# Patient Record
Sex: Female | Born: 1945
Health system: Southern US, Community
[De-identification: ages and names within clinical notes are randomized; demographics above are authoritative.]

## PROBLEM LIST (undated history)

## (undated) DIAGNOSIS — I701 Atherosclerosis of renal artery: Secondary | ICD-10-CM

## (undated) DIAGNOSIS — J189 Pneumonia, unspecified organism: Secondary | ICD-10-CM

## (undated) DIAGNOSIS — I240 Acute coronary thrombosis not resulting in myocardial infarction: Secondary | ICD-10-CM

## (undated) DIAGNOSIS — J449 Chronic obstructive pulmonary disease, unspecified: Secondary | ICD-10-CM

## (undated) DIAGNOSIS — I10 Essential (primary) hypertension: Secondary | ICD-10-CM

## (undated) DIAGNOSIS — I2510 Atherosclerotic heart disease of native coronary artery without angina pectoris: Secondary | ICD-10-CM

## (undated) DIAGNOSIS — I2102 ST elevation (STEMI) myocardial infarction involving left anterior descending coronary artery: Secondary | ICD-10-CM

## (undated) DIAGNOSIS — I509 Heart failure, unspecified: Secondary | ICD-10-CM

## (undated) DIAGNOSIS — I82409 Acute embolism and thrombosis of unspecified deep veins of unspecified lower extremity: Secondary | ICD-10-CM

## (undated) DIAGNOSIS — I255 Ischemic cardiomyopathy: Secondary | ICD-10-CM

## (undated) DIAGNOSIS — Z95810 Presence of automatic (implantable) cardiac defibrillator: Secondary | ICD-10-CM

## (undated) DIAGNOSIS — E785 Hyperlipidemia, unspecified: Secondary | ICD-10-CM

## (undated) DIAGNOSIS — E119 Type 2 diabetes mellitus without complications: Secondary | ICD-10-CM

## (undated) DIAGNOSIS — I219 Acute myocardial infarction, unspecified: Secondary | ICD-10-CM

## (undated) DIAGNOSIS — B159 Hepatitis A without hepatic coma: Secondary | ICD-10-CM

## (undated) DIAGNOSIS — Z951 Presence of aortocoronary bypass graft: Secondary | ICD-10-CM

## (undated) DIAGNOSIS — D689 Coagulation defect, unspecified: Secondary | ICD-10-CM

## (undated) HISTORY — DX: Hyperlipidemia, unspecified: E78.5

## (undated) HISTORY — PX: TONSILLECTOMY AND ADENOIDECTOMY: SUR1326

## (undated) HISTORY — DX: Acute myocardial infarction, unspecified: I21.9

## (undated) HISTORY — DX: Essential (primary) hypertension: I10

## (undated) HISTORY — DX: Atherosclerosis of renal artery: I70.1

## (undated) HISTORY — DX: Atherosclerotic heart disease of native coronary artery without angina pectoris: I25.10

## (undated) HISTORY — DX: Chronic obstructive pulmonary disease, unspecified: J44.9

## (undated) HISTORY — DX: Coagulation defect, unspecified: D68.9

## (undated) HISTORY — DX: Ischemic cardiomyopathy: I25.5

---
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
## 1985-11-21 DIAGNOSIS — B159 Hepatitis A without hepatic coma: Secondary | ICD-10-CM

## 1985-11-21 HISTORY — DX: Hepatitis a without hepatic coma: B15.9

## 1989-11-21 DIAGNOSIS — I219 Acute myocardial infarction, unspecified: Secondary | ICD-10-CM

## 1989-11-21 HISTORY — DX: Acute myocardial infarction, unspecified: I21.9

## 1989-11-21 HISTORY — PX: CORONARY ANGIOPLASTY: SHX604

## 2003-11-22 HISTORY — PX: RENAL ARTERY STENT: SHX2321

## 2013-03-12 DIAGNOSIS — J438 Other emphysema: Secondary | ICD-10-CM | POA: Diagnosis not present

## 2013-03-12 DIAGNOSIS — R062 Wheezing: Secondary | ICD-10-CM | POA: Diagnosis not present

## 2013-03-12 DIAGNOSIS — J209 Acute bronchitis, unspecified: Secondary | ICD-10-CM | POA: Diagnosis not present

## 2013-03-12 DIAGNOSIS — I10 Essential (primary) hypertension: Secondary | ICD-10-CM | POA: Diagnosis not present

## 2013-03-12 DIAGNOSIS — R059 Cough, unspecified: Secondary | ICD-10-CM | POA: Diagnosis not present

## 2013-03-12 DIAGNOSIS — R05 Cough: Secondary | ICD-10-CM | POA: Diagnosis not present

## 2013-04-11 ENCOUNTER — Ambulatory Visit: Payer: Self-pay | Admitting: Cardiology

## 2013-05-07 ENCOUNTER — Telehealth: Payer: Self-pay | Admitting: Cardiology

## 2013-05-07 DIAGNOSIS — Z1231 Encounter for screening mammogram for malignant neoplasm of breast: Secondary | ICD-10-CM | POA: Diagnosis not present

## 2013-05-07 NOTE — Telephone Encounter (Signed)
Pt Dropped Off LOTS of Medical Records For Her NP appt W/ Dr.Hochrein, gave to  [facility] F. He Wants originals back, Copy what Needs to Go in Chart  05/07/13/KM

## 2013-06-03 ENCOUNTER — Telehealth: Payer: Self-pay | Admitting: Cardiology

## 2013-06-03 NOTE — Telephone Encounter (Signed)
ROI faxed to [facility] [phone]  06/03/13/KM

## 2013-06-04 ENCOUNTER — Encounter: Payer: Self-pay | Admitting: Cardiology

## 2013-06-04 ENCOUNTER — Telehealth: Payer: Self-pay | Admitting: Cardiology

## 2013-06-04 ENCOUNTER — Ambulatory Visit (INDEPENDENT_AMBULATORY_CARE_PROVIDER_SITE_OTHER): Payer: Medicare Other | Admitting: Cardiology

## 2013-06-04 VITALS — BP 100/60 | HR 60 | Ht 66.0 in | Wt 179.4 lb

## 2013-06-04 DIAGNOSIS — I2510 Atherosclerotic heart disease of native coronary artery without angina pectoris: Secondary | ICD-10-CM

## 2013-06-04 DIAGNOSIS — I25810 Atherosclerosis of coronary artery bypass graft(s) without angina pectoris: Secondary | ICD-10-CM

## 2013-06-04 HISTORY — DX: Atherosclerotic heart disease of native coronary artery without angina pectoris: I25.10

## 2013-06-04 NOTE — Patient Instructions (Addendum)
The current medical regimen is effective;  continue present plan and medications.  Follow up in 1 year with Dr Hochrein.  You will receive a letter in the mail 2 months before you are due.  Please call us when you receive this letter to schedule your follow up appointment.  

## 2013-06-04 NOTE — Progress Notes (Signed)
HPI The patient presents as a new patient for evaluation of coronary disease and a history of a V. Fib arrest. She had a myocardial infarction in 1991. This was treated with TPA. She was evaluated in 1996 for angina. Cardiac catheterization at that time demonstrated mild narrowing in the mid LAD, circumflex had 50-60% distal circumflex stenosis before the origin of the posterior lateral. The right coronary artery was dominant but narrow caliber with long diffuse 50% stenosis and mid 90% stenosis.  She has had a normal left ventricular function.  She was most recently evaluated with a stress echocardiogram in 2013. EF was 70%. There was some resting basilar inferior dyskinesia without exercise-induced ischemic wall motion abnormalities.    The patient denies any new symptoms such as chest discomfort, neck or arm discomfort. There has been no new shortness of breath, PND or orthopnea. There have been no reported palpitations, presyncope or syncope.  She gets rare twinges of sharp chest pain but these are fleeting and a stable pattern. She never has to take a nitroglycerin.  She has had no new symptoms since her last stress test.  She exercises 3 x per week.    No Known Allergies  Current Outpatient Prescriptions  Medication Sig Dispense Refill  . aspirin 81 MG tablet Take 81 mg by mouth daily.      Marland Kitchen atenolol (TENORMIN) 50 MG tablet Take 25 mg by mouth daily. 1/2 tab daily      . atorvastatin (LIPITOR) 80 MG tablet Take 80 mg by mouth daily.      . calcium-vitamin D (OSCAL WITH D) 500-200 MG-UNIT per tablet Take 1 tablet by mouth 2 (two) times daily.      Marland Kitchen co-enzyme Q-10 30 MG capsule Take 100 mg by mouth 2 (two) times daily.      . Magnesium 500 MG TABS Take by mouth.      . Multiple Vitamin (MULTIVITAMIN) tablet Take 1 tablet by mouth daily.      . nitroGLYCERIN (NITROSTAT) 0.4 MG SL tablet Place 0.4 mg under the tongue every 5 (five) minutes as needed for chest pain.      . Red Yeast Rice  Extract (RED YEAST RICE PO) Take by mouth.       No current facility-administered medications for this visit.    Past Medical History  Diagnosis Date  . Coronary artery disease   . Hyperlipidemia   . MI (myocardial infarction)   . Unstable angina   . Sinus bradycardia     Past Surgical History  Procedure Laterality Date  . Cardiac catheterization      Family History  Problem Relation Age of Onset  . Heart attack Father     History   Social History  . Marital Status: Married    Spouse Name: N/A    Number of Children: N/A  . Years of Education: N/A   Occupational History  . Not on file.   Social History Main Topics  . Smoking status: Former Smoker    Quit date: 06/05/2011  . Smokeless tobacco: Former Neurosurgeon  . Alcohol Use: Not on file  . Drug Use: Not on file  . Sexually Active: Not on file   Other Topics Concern  . Not on file   Social History Narrative  . No narrative on file    ROS:  Positive for shoulder pain, varicose veins. Otherwise as stated in the HPI and negative for all other systems.  PHYSICAL EXAM BP 100/60  Pulse 60  Ht 5\' 6"  (1.676 m)  Wt 179 lb 6.4 oz (81.375 kg)  BMI 28.97 kg/m2 GENERAL:  Well appearing HEENT:  Pupils equal round and reactive, fundi not visualized, oral mucosa unremarkable NECK:  No jugular venous distention, waveform within normal limits, carotid upstroke brisk and symmetric, no bruits, no thyromegaly LYMPHATICS:  No cervical, inguinal adenopathy LUNGS:  Clear to auscultation bilaterally BACK:  No CVA tenderness CHEST:  Unremarkable HEART:  PMI not displaced or sustained,S1 and S2 within normal limits, no S3, no S4, no clicks, no rubs, no murmurs ABD:  Flat, positive bowel sounds normal in frequency in pitch, no bruits, no rebound, no guarding, no midline pulsatile mass, no hepatomegaly, no splenomegaly EXT:  2 plus pulses throughout, no edema, no cyanosis no clubbing SKIN:  No rashes no nodules NEURO:  Cranial nerves  II through XII grossly intact, motor grossly intact throughout PSYCH:  Cognitively intact, oriented to person place and time   EKG:  Sinus rhythm, rate 60, axis within normal limits, intervals within normal limits, PVCs in a trigeminal pattern, no acute ST-T wave changes. 06/04/2013  ASSESSMENT AND PLAN  CAD:  The patient has no ongoing symptoms and is practicing risk reduction discussed this at length. Given the fact that she had a stress test last year no further testing is indicated at this time.  HYPERLIPIDEMIA:  We had a long discussion about this. I would not suggest red yeast rice with statins.    (All available outside records reviewed.)

## 2013-06-04 NOTE — Telephone Encounter (Signed)
Records rec From [facility] gave to [facility] 06/04/13/KM

## 2013-07-26 DIAGNOSIS — I10 Essential (primary) hypertension: Secondary | ICD-10-CM | POA: Diagnosis not present

## 2013-07-26 DIAGNOSIS — E785 Hyperlipidemia, unspecified: Secondary | ICD-10-CM | POA: Diagnosis not present

## 2013-08-02 DIAGNOSIS — Z0000 Encounter for general adult medical examination without abnormal findings: Secondary | ICD-10-CM | POA: Diagnosis not present

## 2013-08-02 DIAGNOSIS — Z1331 Encounter for screening for depression: Secondary | ICD-10-CM | POA: Diagnosis not present

## 2013-08-02 DIAGNOSIS — J438 Other emphysema: Secondary | ICD-10-CM | POA: Diagnosis not present

## 2013-08-02 DIAGNOSIS — E785 Hyperlipidemia, unspecified: Secondary | ICD-10-CM | POA: Diagnosis not present

## 2013-08-02 DIAGNOSIS — Z23 Encounter for immunization: Secondary | ICD-10-CM | POA: Diagnosis not present

## 2013-08-02 DIAGNOSIS — I10 Essential (primary) hypertension: Secondary | ICD-10-CM | POA: Diagnosis not present

## 2013-08-02 DIAGNOSIS — R7309 Other abnormal glucose: Secondary | ICD-10-CM | POA: Diagnosis not present

## 2013-08-02 DIAGNOSIS — M25519 Pain in unspecified shoulder: Secondary | ICD-10-CM | POA: Diagnosis not present

## 2013-08-02 DIAGNOSIS — I259 Chronic ischemic heart disease, unspecified: Secondary | ICD-10-CM | POA: Diagnosis not present

## 2013-08-06 DIAGNOSIS — Z780 Asymptomatic menopausal state: Secondary | ICD-10-CM | POA: Diagnosis not present

## 2013-08-07 DIAGNOSIS — Z1212 Encounter for screening for malignant neoplasm of rectum: Secondary | ICD-10-CM | POA: Diagnosis not present

## 2013-09-02 DIAGNOSIS — M25519 Pain in unspecified shoulder: Secondary | ICD-10-CM | POA: Diagnosis not present

## 2013-09-02 DIAGNOSIS — F329 Major depressive disorder, single episode, unspecified: Secondary | ICD-10-CM | POA: Diagnosis not present

## 2013-09-02 DIAGNOSIS — E785 Hyperlipidemia, unspecified: Secondary | ICD-10-CM | POA: Diagnosis not present

## 2013-09-02 DIAGNOSIS — Z6828 Body mass index (BMI) 28.0-28.9, adult: Secondary | ICD-10-CM | POA: Diagnosis not present

## 2013-09-23 DIAGNOSIS — M7500 Adhesive capsulitis of unspecified shoulder: Secondary | ICD-10-CM | POA: Diagnosis not present

## 2013-09-23 DIAGNOSIS — M25819 Other specified joint disorders, unspecified shoulder: Secondary | ICD-10-CM | POA: Diagnosis not present

## 2013-09-30 DIAGNOSIS — M25819 Other specified joint disorders, unspecified shoulder: Secondary | ICD-10-CM | POA: Diagnosis not present

## 2013-10-04 DIAGNOSIS — M25819 Other specified joint disorders, unspecified shoulder: Secondary | ICD-10-CM | POA: Diagnosis not present

## 2013-10-08 DIAGNOSIS — M25819 Other specified joint disorders, unspecified shoulder: Secondary | ICD-10-CM | POA: Diagnosis not present

## 2013-10-10 DIAGNOSIS — M25819 Other specified joint disorders, unspecified shoulder: Secondary | ICD-10-CM | POA: Diagnosis not present

## 2013-10-14 DIAGNOSIS — M25819 Other specified joint disorders, unspecified shoulder: Secondary | ICD-10-CM | POA: Diagnosis not present

## 2013-10-16 DIAGNOSIS — M25819 Other specified joint disorders, unspecified shoulder: Secondary | ICD-10-CM | POA: Diagnosis not present

## 2013-10-22 DIAGNOSIS — M25819 Other specified joint disorders, unspecified shoulder: Secondary | ICD-10-CM | POA: Diagnosis not present

## 2013-10-24 DIAGNOSIS — M25819 Other specified joint disorders, unspecified shoulder: Secondary | ICD-10-CM | POA: Diagnosis not present

## 2013-10-29 DIAGNOSIS — M25819 Other specified joint disorders, unspecified shoulder: Secondary | ICD-10-CM | POA: Diagnosis not present

## 2013-11-04 DIAGNOSIS — M25819 Other specified joint disorders, unspecified shoulder: Secondary | ICD-10-CM | POA: Diagnosis not present

## 2014-08-08 DIAGNOSIS — I10 Essential (primary) hypertension: Secondary | ICD-10-CM | POA: Diagnosis not present

## 2014-08-08 DIAGNOSIS — E785 Hyperlipidemia, unspecified: Secondary | ICD-10-CM | POA: Diagnosis not present

## 2014-08-08 DIAGNOSIS — R7309 Other abnormal glucose: Secondary | ICD-10-CM | POA: Diagnosis not present

## 2014-08-15 DIAGNOSIS — J438 Other emphysema: Secondary | ICD-10-CM | POA: Diagnosis not present

## 2014-08-15 DIAGNOSIS — I10 Essential (primary) hypertension: Secondary | ICD-10-CM | POA: Diagnosis not present

## 2014-08-15 DIAGNOSIS — I259 Chronic ischemic heart disease, unspecified: Secondary | ICD-10-CM | POA: Diagnosis not present

## 2014-08-15 DIAGNOSIS — Z23 Encounter for immunization: Secondary | ICD-10-CM | POA: Diagnosis not present

## 2014-08-15 DIAGNOSIS — E785 Hyperlipidemia, unspecified: Secondary | ICD-10-CM | POA: Diagnosis not present

## 2014-08-15 DIAGNOSIS — R7309 Other abnormal glucose: Secondary | ICD-10-CM | POA: Diagnosis not present

## 2014-08-15 DIAGNOSIS — M79609 Pain in unspecified limb: Secondary | ICD-10-CM | POA: Diagnosis not present

## 2014-08-15 DIAGNOSIS — Z0000 Encounter for general adult medical examination without abnormal findings: Secondary | ICD-10-CM | POA: Diagnosis not present

## 2014-08-29 ENCOUNTER — Encounter: Payer: Self-pay | Admitting: Podiatry

## 2014-08-29 ENCOUNTER — Ambulatory Visit (INDEPENDENT_AMBULATORY_CARE_PROVIDER_SITE_OTHER): Payer: Medicare Other

## 2014-08-29 ENCOUNTER — Ambulatory Visit (INDEPENDENT_AMBULATORY_CARE_PROVIDER_SITE_OTHER): Payer: Medicare Other | Admitting: Podiatry

## 2014-08-29 VITALS — BP 141/80 | HR 67 | Resp 13 | Ht 66.0 in | Wt 175.0 lb

## 2014-08-29 DIAGNOSIS — M7741 Metatarsalgia, right foot: Secondary | ICD-10-CM | POA: Diagnosis not present

## 2014-08-29 DIAGNOSIS — M2040 Other hammer toe(s) (acquired), unspecified foot: Secondary | ICD-10-CM

## 2014-08-29 DIAGNOSIS — M79673 Pain in unspecified foot: Secondary | ICD-10-CM | POA: Diagnosis not present

## 2014-08-29 DIAGNOSIS — M7742 Metatarsalgia, left foot: Secondary | ICD-10-CM

## 2014-08-29 DIAGNOSIS — M2010 Hallux valgus (acquired), unspecified foot: Secondary | ICD-10-CM | POA: Diagnosis not present

## 2014-08-29 NOTE — Progress Notes (Signed)
   Subjective:    Patient ID: Hailey Wright, female    DOB: Jul 29, 1946, 68 y.o.   MRN: [id number]  HPI Comments: Hailey Wright, 68 year old female, presents the office they with bilateral foot pain in the ball of her foot as well as curling of the left fourth digit. She states that she had hammertoe procedure on the left fourth digit in the 1970s for which she stated the toe was straightened however over the last several years she's noticed the toe curling and worsening over the last year. Also states that she has a numbness tingling sensation to the ball of her foot when walking and into the toes which has been present for approximately 2 years. Denies any recent injury or trauma to the area. No other complaints at this time.  Foot Pain      Review of Systems  All other systems reviewed and are negative.      Objective:   Physical Exam AAO x3, NAD DP/PT pulses palpable b/l. CRT < 3 sec Protective sensation intact with Simms Weinstein monofilament, vibratory sensation intact, Achilles tendon reflex intact. Diffuse tenderness over the metatarsal heads plantarly 1 through 5 bilaterally with atrophy of the plantar fat pad. Decreased range of motion of the first MTPJ.  Bilateral hammertoe deformities. Left fourth digit and adductovarus position with small pinch callus plantar aspect. Bunion deformity bilaterally. No pinpoint bony tenderness of the digits or metatarsals or pain with vibratory sensation. No pain with range of motion of MTPJ's on the lesser digits.  Decreased in medial arch height upon weightbearing bilaterally. MMT 5/5, ROM WNL No calf pain, swelling, warmth. No open lesions to      Assessment & Plan:  67 year old female with bilateral metatarsalgia as a result of flatfoot, hammertoe deformity, bunion/decreased range of motion of the first MTPJ with loss of fat pad plantarly -X-ray stretching and reviewed the patient. -Conservative versus surgical treatment were discussed  including alternatives, risks, complications. -Discussed supportive shoe gear and possible orthotics to help support her foot type. -Dispensed metatarsal pads and applied just proximal to the metatarsal heads. She states after application and standing she had some relief of symptoms at this time. -Offloading pads to the left fourth digit. -Followup in 4 weeks or sooner if any problems are to arise or any changes symptoms. Discussed with her that she purchase an orthotic to bring it with her at her next appointment. Call sooner with any questions, concerns, change in symptoms.

## 2014-09-02 ENCOUNTER — Encounter: Payer: Self-pay | Admitting: Podiatry

## 2014-09-16 DIAGNOSIS — L814 Other melanin hyperpigmentation: Secondary | ICD-10-CM | POA: Diagnosis not present

## 2014-09-16 DIAGNOSIS — L570 Actinic keratosis: Secondary | ICD-10-CM | POA: Diagnosis not present

## 2014-09-16 DIAGNOSIS — D225 Melanocytic nevi of trunk: Secondary | ICD-10-CM | POA: Diagnosis not present

## 2014-09-16 DIAGNOSIS — L821 Other seborrheic keratosis: Secondary | ICD-10-CM | POA: Diagnosis not present

## 2014-09-16 DIAGNOSIS — D485 Neoplasm of uncertain behavior of skin: Secondary | ICD-10-CM | POA: Diagnosis not present

## 2014-09-18 DIAGNOSIS — Z1231 Encounter for screening mammogram for malignant neoplasm of breast: Secondary | ICD-10-CM | POA: Diagnosis not present

## 2014-09-19 ENCOUNTER — Encounter: Payer: Self-pay | Admitting: *Deleted

## 2014-09-24 ENCOUNTER — Encounter: Payer: Self-pay | Admitting: Cardiology

## 2014-09-24 ENCOUNTER — Ambulatory Visit (INDEPENDENT_AMBULATORY_CARE_PROVIDER_SITE_OTHER): Payer: Medicare Other | Admitting: Cardiology

## 2014-09-24 VITALS — BP 120/66 | HR 60 | Ht 66.0 in | Wt 182.0 lb

## 2014-09-24 DIAGNOSIS — I208 Other forms of angina pectoris: Secondary | ICD-10-CM

## 2014-09-24 MED ORDER — ISOSORBIDE MONONITRATE ER 30 MG PO TB24: 30.0000 mg | ORAL_TABLET | Freq: Every day | ORAL | Status: DC

## 2014-09-24 NOTE — Progress Notes (Signed)
HPI The patient presents as a new patient for evaluation of coronary disease and a history of a V. Fib arrest. She had a myocardial infarction in 1991. This was treated with TPA. She was evaluated in 1996 for angina. Cardiac catheterization at that time demonstrated mild narrowing in the mid LAD, circumflex had 50-60% distal circumflex stenosis before the origin of the posterior lateral. The right coronary artery was dominant but narrow caliber with long diffuse 50% stenosis and mid 90% stenosis.  She has had a normal left ventricular function.  She was most recently evaluated with a stress echocardiogram in 2013. EF was 70%. There was some resting basilar inferior dyskinesia without exercise-induced ischemic wall motion abnormalities.    She returns today for followup of this.  She has been having some chest discomfort which seems to have been happening over the past 5-6 weeks. It happens with significant exertion such as when she was hiking at [employer].  She had some 5-6/10 discomfort that reminded her of her previous angina. There was no radiation. It was substernal. There were no associated symptoms. It stops a few minutes after she rests very and she can't get dizzy she does her usual walking on a treadmill. She does think however it might be progressing and how easily it comes on. She does not however have any resting symptoms. She's had no new shortness of breath, PND or orthopnea. She's had no new palpitations, presyncope or syncope.  No Known Allergies  Current Outpatient Prescriptions  Medication Sig Dispense Refill  . aspirin 81 MG tablet Take 81 mg by mouth daily.    Marland Kitchen atenolol (TENORMIN) 25 MG tablet Take 12.5 mg by mouth daily.    Marland Kitchen atorvastatin (LIPITOR) 80 MG tablet Take 80 mg by mouth daily.    . calcium-vitamin D (OSCAL WITH D) 500-200 MG-UNIT per tablet Take 1 tablet by mouth 2 (two) times daily.    Marland Kitchen Co-Enzyme Q10 200 MG CAPS Take 220 mg by mouth daily.    . Magnesium 500 MG  TABS Take by mouth.    . Multiple Vitamin (MULTIVITAMIN) tablet Take 1 tablet by mouth daily.    . nitroGLYCERIN (NITROSTAT) 0.4 MG SL tablet Place 0.4 mg under the tongue every 5 (five) minutes as needed for chest pain.     No current facility-administered medications for this visit.    Past Medical History  Diagnosis Date  . Hyperlipidemia   . MI (myocardial infarction)     Inferior treated with TPA 1991, POBA RCA 1996  . RAS (renal artery stenosis)     Right renal stent 2005    Past Surgical History  Procedure Laterality Date  . Rhinoplasty    . Tonsillectomy and adenoidectomy      ROS:  Positive for shoulder pain, varicose veins. Otherwise as stated in the HPI and negative for all other systems.  PHYSICAL EXAM BP 120/66 mmHg  Pulse 60  Ht 5\' 6"  (1.676 m)  Wt 182 lb (82.555 kg)  BMI 29.39 kg/m2 GENERAL:  Well appearing HEENT:  Pupils equal round and reactive, fundi not visualized, oral mucosa unremarkable NECK:  No jugular venous distention, waveform within normal limits, carotid upstroke brisk and symmetric, no bruits, no thyromegaly LUNGS:  Clear to auscultation bilaterally BACK:  No CVA tenderness CHEST:  Unremarkable HEART:  PMI not displaced or sustained,S1 and S2 within normal limits, no S3, no S4, no clicks, no rubs, no murmurs ABD:  Flat, positive bowel sounds normal in frequency  in pitch, no bruits, no rebound, no guarding, no midline pulsatile mass, no hepatomegaly, no splenomegaly EXT:  2 plus pulses throughout, no edema, no cyanosis no clubbing   EKG:  Sinus rhythm, rate 60, axis within normal limits, intervals within normal limits, PVCs, no acute ST-T wave changes. 09/24/2014  ASSESSMENT AND PLAN  CAD:  The patient is having exertional symptoms. I might consider this class II angina. I'm going to risk stratify with a dobutamine echo. If this is low risk we will continue to manage this medically. I will also start Imdur 30 once a day.  Of note we did discuss  that it she is low risk study but it's worsening symptoms she would still need to come to see me as I would have a low threshold for future cardiac catheterizations.    HYPERLIPIDEMIA:  She is now being followed in one of the PCSK9 studies apparently. I will defer to Donnajean Lopes, MD

## 2014-09-24 NOTE — Addendum Note (Signed)
Addended by: Golden Hurter D on: 09/24/2014 11:39 AM   Modules accepted: Orders

## 2014-09-24 NOTE — Patient Instructions (Signed)
Start Imdur 30 mg daily   Your physician recommends that you schedule a follow-up appointment in: 2 months.

## 2014-09-26 NOTE — Addendum Note (Signed)
Addended by: Vear Clock on: 09/26/2014 03:24 PM   Modules accepted: Orders

## 2014-11-24 ENCOUNTER — Ambulatory Visit (INDEPENDENT_AMBULATORY_CARE_PROVIDER_SITE_OTHER): Payer: Medicare Other | Admitting: Cardiology

## 2014-11-24 ENCOUNTER — Encounter: Payer: Self-pay | Admitting: Cardiology

## 2014-11-24 VITALS — BP 109/64 | HR 55 | Ht 66.0 in | Wt 169.9 lb

## 2014-11-24 DIAGNOSIS — I2510 Atherosclerotic heart disease of native coronary artery without angina pectoris: Secondary | ICD-10-CM | POA: Diagnosis not present

## 2014-11-24 NOTE — Progress Notes (Signed)
HPI The patient presents for follow up of coronary disease and a history of a V. Fib arrest. She had a myocardial infarction in 1991. This was treated with TPA. She was evaluated in 1996 for angina. Cardiac catheterization at that time demonstrated mild narrowing in the mid LAD, circumflex had 50-60% distal circumflex stenosis before the origin of the posterior lateral. The right coronary artery was dominant but narrow caliber with long diffuse 50% stenosis and mid 90% stenosis.  She has had a normal left ventricular function.  She was most recently evaluated with a stress echocardiogram in 2013. EF was 70%. There was some resting basilar inferior dyskinesia without exercise-induced ischemic wall motion abnormalities.    At the last visit the patient was having some chest discomfort . I added Imdur. This completely took care of her symptoms. She says she's exercising routinely on an elliptical and has no issues with this. She cannot develop any chest discomfort with this. She denies any shortness of breath, PND or orthopnea. She feels well.  No Known Allergies  Current Outpatient Prescriptions  Medication Sig Dispense Refill  . aspirin 81 MG tablet Take 81 mg by mouth daily.    Marland Kitchen atenolol (TENORMIN) 25 MG tablet Take 12.5 mg by mouth daily.    Marland Kitchen atorvastatin (LIPITOR) 80 MG tablet Take 80 mg by mouth daily.    . calcium-vitamin D (OSCAL WITH D) 500-200 MG-UNIT per tablet Take 1 tablet by mouth 2 (two) times daily.    Marland Kitchen Co-Enzyme Q10 200 MG CAPS Take 220 mg by mouth daily.    . isosorbide mononitrate (IMDUR) 30 MG 24 hr tablet Take 1 tablet (30 mg total) by mouth daily. 90 tablet 3  . Magnesium 500 MG TABS Take by mouth.    . Multiple Vitamin (MULTIVITAMIN) tablet Take 1 tablet by mouth daily.    . nitroGLYCERIN (NITROSTAT) 0.4 MG SL tablet Place 0.4 mg under the tongue every 5 (five) minutes as needed for chest pain.     No current facility-administered medications for this visit.    Past  Medical History  Diagnosis Date  . Hyperlipidemia   . MI (myocardial infarction)     Inferior treated with TPA 1991, POBA RCA 1996  . RAS (renal artery stenosis)     Right renal stent 2005    Past Surgical History  Procedure Laterality Date  . Rhinoplasty    . Tonsillectomy and adenoidectomy      ROS:  As stated in the HPI and negative for all other systems.  PHYSICAL EXAM BP 109/64 mmHg  Pulse 55  Ht 5\' 6"  (1.676 m)  Wt 169 lb 14.4 oz (77.066 kg)  BMI 27.44 kg/m2 GENERAL:  Well appearing HEENT:  Pupils equal round and reactive, fundi not visualized, oral mucosa unremarkable NECK:  No jugular venous distention, waveform within normal limits, carotid upstroke brisk and symmetric, no bruits, no thyromegaly LUNGS:  Clear to auscultation bilaterally BACK:  No CVA tenderness CHEST:  Unremarkable HEART:  PMI not displaced or sustained,S1 and S2 within normal limits, no S3, no S4, no clicks, no rubs, no murmurs ABD:  Flat, positive bowel sounds normal in frequency in pitch, no bruits, no rebound, no guarding, no midline pulsatile mass, no hepatomegaly, no splenomegaly EXT:  2 plus pulses throughout, no edema, no cyanosis no clubbing   ASSESSMENT AND PLAN  CAD:  The patient is having no further symptoms.  We did not do further stress testing.  However, at this point I  don't think this is indicated. She will continue the medications as listed.  HYPERLIPIDEMIA:  She completed a PCKS9 study and is on statin. I will defer to Donnajean Lopes, MD

## 2014-11-24 NOTE — Patient Instructions (Signed)
Your physician recommends that you schedule a follow-up appointment in: 6 months with Dr. Hochrein  

## 2014-11-28 DIAGNOSIS — Z01419 Encounter for gynecological examination (general) (routine) without abnormal findings: Secondary | ICD-10-CM | POA: Diagnosis not present

## 2014-11-28 DIAGNOSIS — N816 Rectocele: Secondary | ICD-10-CM | POA: Diagnosis not present

## 2015-04-14 DIAGNOSIS — L280 Lichen simplex chronicus: Secondary | ICD-10-CM | POA: Diagnosis not present

## 2015-04-14 DIAGNOSIS — L570 Actinic keratosis: Secondary | ICD-10-CM | POA: Diagnosis not present

## 2015-08-10 DIAGNOSIS — E785 Hyperlipidemia, unspecified: Secondary | ICD-10-CM | POA: Diagnosis not present

## 2015-08-10 DIAGNOSIS — I10 Essential (primary) hypertension: Secondary | ICD-10-CM | POA: Diagnosis not present

## 2015-08-10 DIAGNOSIS — R739 Hyperglycemia, unspecified: Secondary | ICD-10-CM | POA: Diagnosis not present

## 2015-08-11 ENCOUNTER — Other Ambulatory Visit: Payer: Self-pay | Admitting: Cardiology

## 2015-08-13 ENCOUNTER — Ambulatory Visit (INDEPENDENT_AMBULATORY_CARE_PROVIDER_SITE_OTHER): Payer: Medicare Other | Admitting: Cardiology

## 2015-08-13 ENCOUNTER — Encounter: Payer: Self-pay | Admitting: Cardiology

## 2015-08-13 VITALS — BP 102/64 | HR 53 | Ht 66.0 in | Wt 158.2 lb

## 2015-08-13 DIAGNOSIS — I25810 Atherosclerosis of coronary artery bypass graft(s) without angina pectoris: Secondary | ICD-10-CM

## 2015-08-13 MED ORDER — NITROGLYCERIN 0.4 MG SL SUBL: 0.4000 mg | SUBLINGUAL_TABLET | SUBLINGUAL | Status: DC | PRN

## 2015-08-13 NOTE — Patient Instructions (Signed)
Your physician wants you to follow-up in: 1 Year. You will receive a reminder letter in the mail two months in advance. If you don't receive a letter, please call our office to schedule the follow-up appointment.  Your physician has requested that you have an exercise tolerance test. For further information please visit [email or web address]. Please also follow instruction sheet, as given.    

## 2015-08-13 NOTE — Progress Notes (Signed)
HPI The patient presents for follow up of coronary disease and a history of a V. Fib arrest. She had a myocardial infarction in 1991. This was treated with TPA. She was evaluated in 1996 for angina. Cardiac catheterization at that time demonstrated mild narrowing in the mid LAD, circumflex had 50-60% distal circumflex stenosis before the origin of the posterior lateral. The right coronary artery was dominant but narrow caliber with long diffuse 50% stenosis and mid 90% stenosis.  She had a POBA.  She has had a normal left ventricular function.  She had a stress echocardiogram in 2013. EF was 70%. There was some resting basilar inferior dyskinesia without exercise-induced ischemic wall motion abnormalities.    At a previous visit the patient was having some chest discomfort . I added Imdur. This completely took care of her symptoms. She says she's exercising routinely on an elliptical, treadmill and bike and has no symptoms. She cannot develop any chest discomfort with this. She denies any shortness of breath, PND or orthopnea.   She has continue to lost quite a bit of weigh through diet and exercise.    No Known Allergies  Current Outpatient Prescriptions  Medication Sig Dispense Refill  . aspirin 81 MG tablet Take 81 mg by mouth daily.    Marland Kitchen atenolol (TENORMIN) 25 MG tablet Take 12.5 mg by mouth daily.    Marland Kitchen atorvastatin (LIPITOR) 80 MG tablet Take 80 mg by mouth daily.    . calcium-vitamin D (OSCAL WITH D) 500-200 MG-UNIT per tablet Take 1 tablet by mouth 2 (two) times daily.    . clobetasol cream (TEMOVATE) 6.80 % Apply 1 application topically 2 (two) times daily.    Marland Kitchen Co-Enzyme Q10 200 MG CAPS Take 220 mg by mouth daily.    . isosorbide mononitrate (IMDUR) 30 MG 24 hr tablet TAKE 1 TABLET DAILY 90 tablet 2  . Magnesium 500 MG TABS Take by mouth.    . metFORMIN (GLUCOPHAGE-XR) 500 MG 24 hr tablet Take 1 tablet by mouth daily.    . Multiple Vitamin (MULTIVITAMIN) tablet Take 1 tablet by mouth  daily.    . nitroGLYCERIN (NITROSTAT) 0.4 MG SL tablet Place 0.4 mg under the tongue every 5 (five) minutes as needed for chest pain.     No current facility-administered medications for this visit.    Past Medical History  Diagnosis Date  . Hyperlipidemia   . MI (myocardial infarction)     Inferior treated with TPA 1991, POBA RCA 1996  . RAS (renal artery stenosis)     Right renal stent 2005    Past Surgical History  Procedure Laterality Date  . Rhinoplasty    . Tonsillectomy and adenoidectomy      ROS:  As stated in the HPI and negative for all other systems.  PHYSICAL EXAM BP 102/64 mmHg  Pulse 53  Ht 5\' 6"  (1.676 m)  Wt 158 lb 3.2 oz (71.759 kg)  BMI 25.55 kg/m2 GENERAL:  Well appearing HEENT:  Pupils equal round and reactive, fundi not visualized, oral mucosa unremarkable NECK:  No jugular venous distention, waveform within normal limits, carotid upstroke brisk and symmetric, no bruits, no thyromegaly LUNGS:  Clear to auscultation bilaterally CHEST:  Unremarkable HEART:  PMI not displaced or sustained,S1 and S2 within normal limits, no S3, no S4, no clicks, no rubs, no murmurs ABD:  Flat, positive bowel sounds normal in frequency in pitch, no bruits, no rebound, no guarding, no midline pulsatile mass, no hepatomegaly, no splenomegaly  EXT:  2 plus pulses throughout, no edema, no cyanosis no clubbing  EKG:  Sinus rhythm, rate 53, axis within normal limits, intervals within normal limits, no acute ST-T wave changes.  08/13/2015  ASSESSMENT AND PLAN  CAD:  The patient is having no further symptoms.   I would like to screen her with a POET (Plain Old Exercise Treadmill)  HYPERLIPIDEMIA:  She is on statin. I will defer to Donnajean Lopes, MD

## 2015-08-26 DIAGNOSIS — E784 Other hyperlipidemia: Secondary | ICD-10-CM | POA: Diagnosis not present

## 2015-08-26 DIAGNOSIS — Z0000 Encounter for general adult medical examination without abnormal findings: Secondary | ICD-10-CM | POA: Diagnosis not present

## 2015-08-26 DIAGNOSIS — Z23 Encounter for immunization: Secondary | ICD-10-CM | POA: Diagnosis not present

## 2015-08-26 DIAGNOSIS — Z87891 Personal history of nicotine dependence: Secondary | ICD-10-CM | POA: Diagnosis not present

## 2015-08-26 DIAGNOSIS — I10 Essential (primary) hypertension: Secondary | ICD-10-CM | POA: Diagnosis not present

## 2015-08-26 DIAGNOSIS — Z6825 Body mass index (BMI) 25.0-25.9, adult: Secondary | ICD-10-CM | POA: Diagnosis not present

## 2015-08-26 DIAGNOSIS — Z9861 Coronary angioplasty status: Secondary | ICD-10-CM | POA: Diagnosis not present

## 2015-08-26 DIAGNOSIS — J439 Emphysema, unspecified: Secondary | ICD-10-CM | POA: Diagnosis not present

## 2015-08-26 DIAGNOSIS — R739 Hyperglycemia, unspecified: Secondary | ICD-10-CM | POA: Diagnosis not present

## 2015-08-26 DIAGNOSIS — F329 Major depressive disorder, single episode, unspecified: Secondary | ICD-10-CM | POA: Diagnosis not present

## 2015-08-26 DIAGNOSIS — Z1389 Encounter for screening for other disorder: Secondary | ICD-10-CM | POA: Diagnosis not present

## 2015-09-01 DIAGNOSIS — Z1212 Encounter for screening for malignant neoplasm of rectum: Secondary | ICD-10-CM | POA: Diagnosis not present

## 2015-09-10 ENCOUNTER — Encounter (HOSPITAL_COMMUNITY): Payer: Self-pay | Admitting: *Deleted

## 2015-09-10 ENCOUNTER — Telehealth (HOSPITAL_COMMUNITY): Payer: Self-pay | Admitting: *Deleted

## 2015-09-10 ENCOUNTER — Ambulatory Visit (HOSPITAL_COMMUNITY)
Admission: RE | Admit: 2015-09-10 | Discharge: 2015-09-10 | Disposition: A | Discharge status: Home / Self Care | Payer: Medicare Other | Source: Ambulatory Visit | Attending: Cardiology | Admitting: Cardiology

## 2015-09-10 DIAGNOSIS — I25810 Atherosclerosis of coronary artery bypass graft(s) without angina pectoris: Secondary | ICD-10-CM | POA: Insufficient documentation

## 2015-09-10 LAB — EXERCISE TOLERANCE TEST
CHL CUP RESTING HR STRESS: 68 {beats}/min
CSEPHR: 78 %
CSEPPHR: 120 {beats}/min
Estimated workload: 12.6 METS
Exercise duration (min): 10 min
Exercise duration (sec): 31 s
MPHR: 153 {beats}/min
RPE: 16

## 2015-09-10 NOTE — Progress Notes (Unsigned)
Patient ID: Hailey Wright, female   DOB: 18-Mar-1946, 69 y.o.   MRN: [id number] Patient had +ST-T changes. Showed study to Dr. Debara Pickett and he ok'd patient to be d/c home.

## 2015-09-10 NOTE — Telephone Encounter (Signed)
Close encounter 

## 2015-09-11 ENCOUNTER — Telehealth: Payer: Self-pay | Admitting: *Deleted

## 2015-09-11 DIAGNOSIS — R9439 Abnormal result of other cardiovascular function study: Secondary | ICD-10-CM

## 2015-09-11 NOTE — Telephone Encounter (Signed)
Abnormal ETT results discussed with dr hilty (DOD).  pt aware of results and lexiscan myoview order placed and sent to admin for scheduling. Follow up to be scheduled after testing complete.

## 2015-09-15 ENCOUNTER — Telehealth (HOSPITAL_COMMUNITY): Payer: Self-pay

## 2015-09-15 NOTE — Telephone Encounter (Signed)
Encounter complete. 

## 2015-09-16 ENCOUNTER — Ambulatory Visit (HOSPITAL_COMMUNITY)
Admission: RE | Admit: 2015-09-16 | Discharge: 2015-09-16 | Disposition: A | Discharge status: Home / Self Care | Payer: Medicare Other | Source: Ambulatory Visit | Attending: Cardiology | Admitting: Cardiology

## 2015-09-16 DIAGNOSIS — Z87891 Personal history of nicotine dependence: Secondary | ICD-10-CM | POA: Insufficient documentation

## 2015-09-16 DIAGNOSIS — Z8249 Family history of ischemic heart disease and other diseases of the circulatory system: Secondary | ICD-10-CM | POA: Diagnosis not present

## 2015-09-16 DIAGNOSIS — R9431 Abnormal electrocardiogram [ECG] [EKG]: Secondary | ICD-10-CM | POA: Diagnosis not present

## 2015-09-16 DIAGNOSIS — R9439 Abnormal result of other cardiovascular function study: Secondary | ICD-10-CM | POA: Insufficient documentation

## 2015-09-16 LAB — MYOCARDIAL PERFUSION IMAGING
CHL CUP NUCLEAR SDS: 2
CHL CUP RESTING HR STRESS: 46 {beats}/min
CSEPPHR: 68 {beats}/min
LVDIAVOL: 85 mL
LVSYSVOL: 36 mL
NUC STRESS TID: 1.1
SRS: 1
SSS: 3

## 2015-09-16 MED ORDER — REGADENOSON 0.4 MG/5ML IV SOLN
0.4000 mg | Freq: Once | INTRAVENOUS | Status: AC
  Administered 2015-09-16: 0.4 mg via INTRAVENOUS

## 2015-09-16 MED ORDER — TECHNETIUM TC 99M SESTAMIBI GENERIC - CARDIOLITE
11.0000 | Freq: Once | INTRAVENOUS | Status: AC | PRN
  Administered 2015-09-16: 11 via INTRAVENOUS

## 2015-09-16 MED ORDER — TECHNETIUM TC 99M SESTAMIBI GENERIC - CARDIOLITE
30.2000 | Freq: Once | INTRAVENOUS | Status: AC | PRN
  Administered 2015-09-16: 30.2 via INTRAVENOUS

## 2015-09-16 NOTE — Telephone Encounter (Signed)
Encounter complete. 

## 2015-09-21 ENCOUNTER — Ambulatory Visit (INDEPENDENT_AMBULATORY_CARE_PROVIDER_SITE_OTHER): Payer: Medicare Other | Admitting: Cardiology

## 2015-09-21 ENCOUNTER — Encounter: Payer: Self-pay | Admitting: Cardiology

## 2015-09-21 VITALS — BP 106/70 | HR 58 | Ht 66.0 in | Wt 157.0 lb

## 2015-09-21 DIAGNOSIS — I2510 Atherosclerotic heart disease of native coronary artery without angina pectoris: Secondary | ICD-10-CM | POA: Diagnosis not present

## 2015-09-21 DIAGNOSIS — I25810 Atherosclerosis of coronary artery bypass graft(s) without angina pectoris: Secondary | ICD-10-CM | POA: Diagnosis not present

## 2015-09-21 NOTE — Progress Notes (Signed)
HPI The patient presents for follow up of coronary disease and a history of a V. Fib arrest. She had a myocardial infarction in 1991. This was treated with TPA. She was evaluated in 1996 for angina. Cardiac catheterization at that time demonstrated mild narrowing in the mid LAD, circumflex had 50-60% distal circumflex stenosis before the origin of the posterior lateral. The right coronary artery was dominant but narrow caliber with long diffuse 50% stenosis and mid 90% stenosis.  She had a POBA.  She has had a normal left ventricular function.  She had a stress echocardiogram in 2013. EF was 70%. There was some resting basilar inferior dyskinesia without exercise-induced ischemic wall motion abnormalities.  She was having some mild symptoms previously so I sent her for treadmill test which unfortunately was equivocal. However, stress perfusion study demonstrated well-preserved ejection fraction of 77%. There was no evidence of ischemia. She reports she is otherwise doing well. I did add Imdur at the last visit.. This completely took care of her symptoms. She says she's exercising routinely on an elliptical, treadmill and bike and has no symptoms. She cannot develop any chest discomfort with this. She denies any shortness of breath, PND or orthopnea.     No Known Allergies  Current Outpatient Prescriptions  Medication Sig Dispense Refill  . aspirin 81 MG tablet Take 81 mg by mouth daily.    Marland Kitchen atenolol (TENORMIN) 25 MG tablet Take 12.5 mg by mouth daily.    Marland Kitchen atorvastatin (LIPITOR) 80 MG tablet Take 80 mg by mouth daily.    . calcium-vitamin D (OSCAL WITH D) 500-200 MG-UNIT per tablet Take 1 tablet by mouth 2 (two) times daily.    . clobetasol cream (TEMOVATE) 8.75 % Apply 1 application topically 2 (two) times daily.    Marland Kitchen Co-Enzyme Q10 200 MG CAPS Take 220 mg by mouth daily.    . isosorbide mononitrate (IMDUR) 30 MG 24 hr tablet TAKE 1 TABLET DAILY 90 tablet 2  . Magnesium 500 MG TABS Take by mouth.     . metFORMIN (GLUCOPHAGE-XR) 500 MG 24 hr tablet Take 1 tablet by mouth daily.    . Multiple Vitamin (MULTIVITAMIN) tablet Take 1 tablet by mouth daily.    . nitroGLYCERIN (NITROSTAT) 0.4 MG SL tablet Place 1 tablet (0.4 mg total) under the tongue every 5 (five) minutes as needed for chest pain. 25 tablet 2   No current facility-administered medications for this visit.    Past Medical History  Diagnosis Date  . Hyperlipidemia   . MI (myocardial infarction) ([place])     Inferior treated with TPA 1991, POBA RCA 1996  . RAS (renal artery stenosis) ([place])     Right renal stent 2005    Past Surgical History  Procedure Laterality Date  . Rhinoplasty    . Tonsillectomy and adenoidectomy      ROS:  As stated in the HPI and negative for all other systems.  PHYSICAL EXAM BP 106/70 mmHg  Pulse 58  Ht 5\' 6"  (1.676 m)  Wt 157 lb (71.215 kg)  BMI 25.35 kg/m2 GENERAL:  Well appearing HEENT:  Pupils equal round and reactive, fundi not visualized, oral mucosa unremarkable NECK:  No jugular venous distention, waveform within normal limits, carotid upstroke brisk and symmetric, no bruits, no thyromegaly LUNGS:  Clear to auscultation bilaterally CHEST:  Unremarkable HEART:  PMI not displaced or sustained,S1 and S2 within normal limits, no S3, no S4, no clicks, no rubs, no murmurs ABD:  Flat, positive  bowel sounds normal in frequency in pitch, no bruits, no rebound, no guarding, no midline pulsatile mass, no hepatomegaly, no splenomegaly EXT:  2 plus pulses throughout, no edema, no cyanosis no clubbing   ASSESSMENT AND PLAN  CAD:  The patient is having no further symptoms.   She will continue with risk reduction.   HYPERLIPIDEMIA:  She is on target statin dose.  Her LDL and HDL ratio is great at 103/78

## 2015-09-21 NOTE — Patient Instructions (Signed)
Your physician wants you to follow-up in: 1 Year. You will receive a reminder letter in the mail two months in advance. If you don't receive a letter, please call our office to schedule the follow-up appointment.  

## 2015-09-22 ENCOUNTER — Encounter: Payer: Self-pay | Admitting: Cardiology

## 2015-09-24 DIAGNOSIS — Z1231 Encounter for screening mammogram for malignant neoplasm of breast: Secondary | ICD-10-CM | POA: Diagnosis not present

## 2015-11-09 ENCOUNTER — Other Ambulatory Visit: Payer: Self-pay | Admitting: Acute Care

## 2015-11-09 DIAGNOSIS — Z87891 Personal history of nicotine dependence: Secondary | ICD-10-CM

## 2015-11-11 ENCOUNTER — Encounter: Payer: Medicare Other | Admitting: Acute Care

## 2015-11-11 ENCOUNTER — Ambulatory Visit
Admission: RE | Admit: 2015-11-11 | Discharge: 2015-11-11 | Disposition: A | Discharge status: Home / Self Care | Payer: Medicare Other | Source: Ambulatory Visit | Attending: Acute Care | Admitting: Acute Care

## 2015-11-11 DIAGNOSIS — Z87891 Personal history of nicotine dependence: Secondary | ICD-10-CM | POA: Diagnosis not present

## 2016-02-10 ENCOUNTER — Other Ambulatory Visit: Payer: Self-pay | Admitting: Acute Care

## 2016-02-10 DIAGNOSIS — Z87891 Personal history of nicotine dependence: Secondary | ICD-10-CM

## 2016-02-17 ENCOUNTER — Encounter: Payer: Self-pay | Admitting: Cardiovascular Disease

## 2016-02-17 ENCOUNTER — Other Ambulatory Visit: Payer: Self-pay | Admitting: *Deleted

## 2016-02-17 ENCOUNTER — Ambulatory Visit (INDEPENDENT_AMBULATORY_CARE_PROVIDER_SITE_OTHER): Payer: Medicare Other | Admitting: Cardiovascular Disease

## 2016-02-17 ENCOUNTER — Encounter: Payer: Self-pay | Admitting: Cardiology

## 2016-02-17 ENCOUNTER — Telehealth: Payer: Self-pay | Admitting: Cardiology

## 2016-02-17 VITALS — BP 110/70 | HR 55 | Ht 66.75 in | Wt 167.4 lb

## 2016-02-17 DIAGNOSIS — I25119 Atherosclerotic heart disease of native coronary artery with unspecified angina pectoris: Secondary | ICD-10-CM | POA: Diagnosis not present

## 2016-02-17 DIAGNOSIS — D699 Hemorrhagic condition, unspecified: Secondary | ICD-10-CM | POA: Diagnosis not present

## 2016-02-17 DIAGNOSIS — I209 Angina pectoris, unspecified: Secondary | ICD-10-CM | POA: Diagnosis not present

## 2016-02-17 DIAGNOSIS — I200 Unstable angina: Secondary | ICD-10-CM | POA: Diagnosis not present

## 2016-02-17 DIAGNOSIS — E785 Hyperlipidemia, unspecified: Secondary | ICD-10-CM | POA: Diagnosis not present

## 2016-02-17 DIAGNOSIS — I2510 Atherosclerotic heart disease of native coronary artery without angina pectoris: Secondary | ICD-10-CM | POA: Diagnosis not present

## 2016-02-17 DIAGNOSIS — Z01818 Encounter for other preprocedural examination: Secondary | ICD-10-CM

## 2016-02-17 MED ORDER — ISOSORBIDE MONONITRATE ER 60 MG PO TB24: 60.0000 mg | ORAL_TABLET | Freq: Every day | ORAL | Status: DC

## 2016-02-17 NOTE — Telephone Encounter (Signed)
Hailey Wright is calling because she has been waking up for the past two nights with heavy chest pains. She took her nitro and stopped but then came back after taking the nitro . Please call   Thanks

## 2016-02-17 NOTE — Patient Instructions (Signed)
Medication Instructions:  INCREASE YOUR ISOSORBIDE TO 60 MG DAILY  Labwork: BMET/CBC/PT/INR/PTT AT [facility] ON FIRST FLOOR  Testing/Procedures: Your physician has requested that you have a cardiac catheterization. Cardiac catheterization is used to diagnose and/or treat various heart conditions. Doctors may recommend this procedure for a number of different reasons. The most common reason is to evaluate chest pain. Chest pain can be a symptom of coronary artery disease (CAD), and cardiac catheterization can show whether plaque is narrowing or blocking your heart's arteries. This procedure is also used to evaluate the valves, as well as measure the blood flow and oxygen levels in different parts of your heart. For further information please visit [email or web address]. Please follow instruction sheet, as given.  Follow-Up: WITH DR [facility] AFTER CATH  Any Other Special Instructions Will Be Listed Below (If Applicable). IF CHEST PAIN RETURNS YOU NEED TO GO TO THE EMERGENCY ROOM

## 2016-02-17 NOTE — Progress Notes (Signed)
Cardiology Office Note   Date:  02/18/2016   ID:  Hailey Wright, DOB 11/23/1945, MRN [id number]  PCP:  Hailey Lopes, MD  Cardiologist:   Hailey File, MD  Chief Complaint  Patient presents with  . Follow-up    triage call--chest pain for the last 2 nights  pt chest heaviness going down tops of both arms, and between shoulder blades--took nitro twice, growing in severity each time it happens  . Shortness of Breath    on exertion or at rest  . Dizziness    random      History of Present Illness: Hailey Wright is a 70 y.o. female with coronary artery disease status post myocardial infarction, ventricular fibrillation arrest, hyperlipidemia, and renal artery stenosis who presents for an evaluation of chest pain.  She had an episode of chest pain two days ago that occurred while knitting.  The episode lasted approximately 20 minutes.  The last two nights he awakened with chest pain.  She tried taking nitroglycerin which did help after the second tablet.  The pain is substernal and radiates into bilateral arms and into her jaw. She describes it as an 8 out of 10 heaviness that last for approximately 20 minutes. There is no associated shortness of breath at the time but she has noted increased exertional dyspnea lately. She also notices diaphoresis during the episode of chest pain. She did not note any nausea.  She also endorses lightheadedness.  Hailey Wright had a nuclear stress in October that was negative for ischemia.  She exercises 3 days per week at the [facility] and rarely has chest pain.  She has not noted any lower extremity edema, orthopnea, or PND.  Hailey Wright had a myocardial infarction in 1991 that was treated with TPA. She initially presented with VF arrest. In 1996 she had a cardiac catheterization that demonstrated mild stenosis of the mid LAD, 50-60% circumflex lesion and a long, diffuse 50% stenosis of the RCA with mid 90% stenosis. She underwent balloon angioplasty.  She last saw  Dr. Lolly Wright in October at which time she was doing well.   Past Medical History  Diagnosis Date  . Hyperlipidemia   . MI (myocardial infarction) ([place])     Inferior treated with TPA 1991, POBA RCA 1996  . RAS (renal artery stenosis) ([place])     Right renal stent 2005  . Unstable angina ([place]) 02/18/2016  . CAD (coronary artery disease) 06/04/2013    Past Surgical History  Procedure Laterality Date  . Rhinoplasty    . Tonsillectomy and adenoidectomy       Current Outpatient Prescriptions  Medication Sig Dispense Refill  . aspirin 81 MG tablet Take 81 mg by mouth daily.    Marland Kitchen atenolol (TENORMIN) 25 MG tablet Take 12.5 mg by mouth daily.    Marland Kitchen atorvastatin (LIPITOR) 80 MG tablet Take 80 mg by mouth daily.    . calcium-vitamin D (OSCAL WITH D) 500-200 MG-UNIT per tablet Take 1 tablet by mouth 2 (two) times daily.    . clobetasol cream (TEMOVATE) AB-123456789 % Apply 1 application topically 2 (two) times daily.    Marland Kitchen Co-Enzyme Q10 200 MG CAPS Take 220 mg by mouth daily.    . isosorbide mononitrate (IMDUR) 60 MG 24 hr tablet Take 1 tablet (60 mg total) by mouth daily. 30 tablet 3  . Magnesium 500 MG TABS Take by mouth.    . metFORMIN (GLUCOPHAGE-XR) 500 MG 24 hr tablet Take 1 tablet by mouth daily.    Marland Kitchen  Multiple Vitamin (MULTIVITAMIN) tablet Take 1 tablet by mouth daily.    . nitroGLYCERIN (NITROSTAT) 0.4 MG SL tablet Place 1 tablet (0.4 mg total) under the tongue every 5 (five) minutes as needed for chest pain. 25 tablet 2   No current facility-administered medications for this visit.    Allergies:   Review of patient's allergies indicates no known allergies.    Social History:  The patient  reports that she quit smoking about 4 years ago. Her smoking use included Cigarettes. She has a 40 pack-year smoking history. She has quit using smokeless tobacco.   Family History:  The patient's family history includes Heart attack (age of onset: 56) in her brother; Heart attack (age of onset: 86) in her  father.    ROS:  Please see the history of present illness.   Otherwise, review of systems are positive for none.   All other systems are reviewed and negative.    PHYSICAL EXAM: VS:  BP 110/70 mmHg  Pulse 55  Ht 5' 6.75" (1.695 m)  Wt 75.932 kg (167 lb 6.4 oz)  BMI 26.43 kg/m2 , BMI Body mass index is 26.43 kg/(m^2). GENERAL:  Well appearing HEENT:  Pupils equal round and reactive, fundi not visualized, oral mucosa unremarkable NECK:  No jugular venous distention, waveform within normal limits, carotid upstroke brisk and symmetric, no bruits, no thyromegaly LYMPHATICS:  No cervical adenopathy LUNGS:  Clear to auscultation bilaterally HEART:  RRR.  PMI not displaced or sustained,S1 and S2 within normal limits, no S3, no S4, no clicks, no rubs, no murmurs ABD:  Flat, positive bowel sounds normal in frequency in pitch, no bruits, no rebound, no guarding, no midline pulsatile mass, no hepatomegaly, no splenomegaly EXT:  2 plus pulses throughout, no edema, no cyanosis no clubbing SKIN:  No rashes no nodules NEURO:  Cranial nerves II through XII grossly intact, motor grossly intact throughout PSYCH:  Cognitively intact, oriented to person place and time   EKG:  EKG is ordered today. The ekg ordered today demonstrates sinus bradycardia rate 55 bpm.     Recent Labs: 02/17/2016: BUN 28*; Creat 0.80; Hemoglobin 14.6; Platelets 233; Potassium 4.9; Sodium 144    Lipid Panel No results found for: CHOL, TRIG, HDL, CHOLHDL, VLDL, LDLCALC, LDLDIRECT    Wt Readings from Last 3 Encounters:  02/17/16 75.932 kg (167 lb 6.4 oz)  09/21/15 71.215 kg (157 lb)  09/16/15 71.668 kg (158 lb)      ASSESSMENT AND PLAN:  # Angina, CAD s/p MI: Hailey Wright symptoms are concerning for angina.  Given her coronary history and the fact that she had a recent negative stress test, we will refer her for cardiac catheterization.  She is amenable to this plan. We have instructed her that if she has chest pain  that does not resolve after 3 sublingual nitroglycerin, she is to be seen in the emergency department. She expressed understanding.  Continue aspirin, atenolol, atorvastatin, and isosorbide mononitrate. We discussed the idea of increasing her Imdur, but her blood pressure is on the low side and she is not interested.   Current medicines are reviewed at length with the patient today.  The patient does not have concerns regarding medicines.  The following changes have been made:  no change  Labs/ tests ordered today include:  Orders Placed This Encounter  Procedures  . INR/PT  . PTT  . CBC with Differential/Platelet  . Basic metabolic panel  . EKG 12-Lead     Disposition:  FU with Dr. Percival Spanish post cath.    This note was written with the assistance of speech recognition software.  Please excuse any transcriptional errors.  Signed, Nicol Herbig C. Oval Linsey, MD, [facility]  02/18/2016 6:21 AM    [place]

## 2016-02-17 NOTE — Telephone Encounter (Signed)
Spoke to patient  She states she had chest pain 2 night in a row 2 night ago- awaken chest discomfort - NTG X 2  Last night had 2 separate episodes - the first only required NTG x 1  few hour later  NTG X 3 -- but patient used ntg x1 and then 2 NTG TABLETS at the sametime. RN gave instruction on how to use NTG properly. Patient verbalized understanding. Appointment schedule for today at 2 pm with D.O.D ( DR [place])

## 2016-02-18 ENCOUNTER — Encounter (HOSPITAL_COMMUNITY)
Admission: EM | Disposition: A | Discharge status: Home / Self Care | Payer: Self-pay | Source: Home / Self Care | Attending: Thoracic Surgery (Cardiothoracic Vascular Surgery)

## 2016-02-18 ENCOUNTER — Encounter: Payer: Self-pay | Admitting: Cardiovascular Disease

## 2016-02-18 ENCOUNTER — Other Ambulatory Visit: Payer: Self-pay

## 2016-02-18 ENCOUNTER — Inpatient Hospital Stay (HOSPITAL_COMMUNITY)
Admission: EM | Admit: 2016-02-18 | Discharge: 2016-02-23 | DRG: 233 | Disposition: A | Discharge status: Home / Self Care | Payer: Medicare Other | Attending: Thoracic Surgery (Cardiothoracic Vascular Surgery) | Admitting: Thoracic Surgery (Cardiothoracic Vascular Surgery)

## 2016-02-18 ENCOUNTER — Emergency Department (HOSPITAL_COMMUNITY): Payer: Medicare Other | Admitting: Anesthesiology

## 2016-02-18 ENCOUNTER — Emergency Department (HOSPITAL_COMMUNITY): Payer: Medicare Other

## 2016-02-18 ENCOUNTER — Encounter (HOSPITAL_COMMUNITY): Payer: Self-pay | Admitting: Emergency Medicine

## 2016-02-18 DIAGNOSIS — I110 Hypertensive heart disease with heart failure: Secondary | ICD-10-CM | POA: Diagnosis present

## 2016-02-18 DIAGNOSIS — I213 ST elevation (STEMI) myocardial infarction of unspecified site: Secondary | ICD-10-CM

## 2016-02-18 DIAGNOSIS — Z951 Presence of aortocoronary bypass graft: Secondary | ICD-10-CM

## 2016-02-18 DIAGNOSIS — I2129 ST elevation (STEMI) myocardial infarction involving other sites: Secondary | ICD-10-CM | POA: Diagnosis not present

## 2016-02-18 DIAGNOSIS — I5021 Acute systolic (congestive) heart failure: Secondary | ICD-10-CM | POA: Diagnosis not present

## 2016-02-18 DIAGNOSIS — D62 Acute posthemorrhagic anemia: Secondary | ICD-10-CM | POA: Diagnosis not present

## 2016-02-18 DIAGNOSIS — I25110 Atherosclerotic heart disease of native coronary artery with unstable angina pectoris: Secondary | ICD-10-CM | POA: Diagnosis not present

## 2016-02-18 DIAGNOSIS — R001 Bradycardia, unspecified: Secondary | ICD-10-CM | POA: Diagnosis not present

## 2016-02-18 DIAGNOSIS — Z87891 Personal history of nicotine dependence: Secondary | ICD-10-CM

## 2016-02-18 DIAGNOSIS — I200 Unstable angina: Secondary | ICD-10-CM | POA: Insufficient documentation

## 2016-02-18 DIAGNOSIS — R570 Cardiogenic shock: Secondary | ICD-10-CM | POA: Diagnosis not present

## 2016-02-18 DIAGNOSIS — J9811 Atelectasis: Secondary | ICD-10-CM | POA: Diagnosis not present

## 2016-02-18 DIAGNOSIS — I2510 Atherosclerotic heart disease of native coronary artery without angina pectoris: Secondary | ICD-10-CM | POA: Diagnosis not present

## 2016-02-18 DIAGNOSIS — I2121 ST elevation (STEMI) myocardial infarction involving left circumflex coronary artery: Secondary | ICD-10-CM | POA: Diagnosis present

## 2016-02-18 DIAGNOSIS — I252 Old myocardial infarction: Secondary | ICD-10-CM | POA: Diagnosis present

## 2016-02-18 DIAGNOSIS — I240 Acute coronary thrombosis not resulting in myocardial infarction: Secondary | ICD-10-CM

## 2016-02-18 DIAGNOSIS — I255 Ischemic cardiomyopathy: Secondary | ICD-10-CM | POA: Diagnosis present

## 2016-02-18 DIAGNOSIS — R079 Chest pain, unspecified: Secondary | ICD-10-CM

## 2016-02-18 DIAGNOSIS — I25700 Atherosclerosis of coronary artery bypass graft(s), unspecified, with unstable angina pectoris: Secondary | ICD-10-CM | POA: Diagnosis not present

## 2016-02-18 DIAGNOSIS — I2109 ST elevation (STEMI) myocardial infarction involving other coronary artery of anterior wall: Principal | ICD-10-CM | POA: Diagnosis present

## 2016-02-18 DIAGNOSIS — E119 Type 2 diabetes mellitus without complications: Secondary | ICD-10-CM | POA: Diagnosis present

## 2016-02-18 DIAGNOSIS — Z9861 Coronary angioplasty status: Secondary | ICD-10-CM

## 2016-02-18 DIAGNOSIS — E785 Hyperlipidemia, unspecified: Secondary | ICD-10-CM | POA: Diagnosis not present

## 2016-02-18 DIAGNOSIS — D696 Thrombocytopenia, unspecified: Secondary | ICD-10-CM | POA: Diagnosis not present

## 2016-02-18 DIAGNOSIS — Z9889 Other specified postprocedural states: Secondary | ICD-10-CM

## 2016-02-18 DIAGNOSIS — R0789 Other chest pain: Secondary | ICD-10-CM | POA: Diagnosis not present

## 2016-02-18 DIAGNOSIS — Z7984 Long term (current) use of oral hypoglycemic drugs: Secondary | ICD-10-CM

## 2016-02-18 DIAGNOSIS — R0602 Shortness of breath: Secondary | ICD-10-CM | POA: Diagnosis not present

## 2016-02-18 DIAGNOSIS — I2102 ST elevation (STEMI) myocardial infarction involving left anterior descending coronary artery: Secondary | ICD-10-CM | POA: Diagnosis present

## 2016-02-18 DIAGNOSIS — Z7982 Long term (current) use of aspirin: Secondary | ICD-10-CM

## 2016-02-18 DIAGNOSIS — Z8249 Family history of ischemic heart disease and other diseases of the circulatory system: Secondary | ICD-10-CM

## 2016-02-18 HISTORY — PX: CORONARY ARTERY BYPASS GRAFT: SHX141

## 2016-02-18 HISTORY — DX: ST elevation (STEMI) myocardial infarction involving left anterior descending coronary artery: I21.02

## 2016-02-18 HISTORY — DX: Acute coronary thrombosis not resulting in myocardial infarction: I24.0

## 2016-02-18 HISTORY — PX: CARDIAC CATHETERIZATION: SHX172

## 2016-02-18 HISTORY — DX: Presence of aortocoronary bypass graft: Z95.1

## 2016-02-18 LAB — CBC WITH DIFFERENTIAL/PLATELET
BASOS ABS: 0.1 10*3/uL (ref 0.0–0.1)
BASOS PCT: 1 % (ref 0–1)
EOS ABS: 0.3 10*3/uL (ref 0.0–0.7)
EOS PCT: 4 % (ref 0–5)
HCT: 44.1 % (ref 36.0–46.0)
Hemoglobin: 14.6 g/dL (ref 12.0–15.0)
LYMPHS ABS: 2.7 10*3/uL (ref 0.7–4.0)
Lymphocytes Relative: 39 % (ref 12–46)
MCH: 29.4 pg (ref 26.0–34.0)
MCHC: 33.1 g/dL (ref 30.0–36.0)
MCV: 88.7 fL (ref 78.0–100.0)
MPV: 9.7 fL (ref 8.6–12.4)
Monocytes Absolute: 0.7 10*3/uL (ref 0.1–1.0)
Monocytes Relative: 10 % (ref 3–12)
NEUTROS PCT: 46 % (ref 43–77)
Neutro Abs: 3.2 10*3/uL (ref 1.7–7.7)
PLATELETS: 233 10*3/uL (ref 150–400)
RBC: 4.97 MIL/uL (ref 3.87–5.11)
RDW: 14.2 % (ref 11.5–15.5)
WBC: 7 10*3/uL (ref 4.0–10.5)

## 2016-02-18 LAB — CBC
HCT: 40.4 % (ref 36.0–46.0)
HEMATOCRIT: 38.9 % (ref 36.0–46.0)
HEMOGLOBIN: 12.8 g/dL (ref 12.0–15.0)
Hemoglobin: 13.3 g/dL (ref 12.0–15.0)
MCH: 29.9 pg (ref 26.0–34.0)
MCH: 29.6 pg (ref 26.0–34.0)
MCHC: 32.9 g/dL (ref 30.0–36.0)
MCHC: 32.9 g/dL (ref 30.0–36.0)
MCV: 90.8 fL (ref 78.0–100.0)
MCV: 90 fL (ref 78.0–100.0)
PLATELETS: 179 10*3/uL (ref 150–400)
Platelets: 165 10*3/uL (ref 150–400)
RBC: 4.45 MIL/uL (ref 3.87–5.11)
RBC: 4.32 MIL/uL (ref 3.87–5.11)
RDW: 13.7 % (ref 11.5–15.5)
RDW: 13.6 % (ref 11.5–15.5)
WBC: 10.4 10*3/uL (ref 4.0–10.5)
WBC: 11.2 10*3/uL — ABNORMAL HIGH (ref 4.0–10.5)

## 2016-02-18 LAB — POCT I-STAT, CHEM 8
BUN: 20 mg/dL (ref 6–20)
BUN: 20 mg/dL (ref 6–20)
BUN: 22 mg/dL — AB (ref 6–20)
BUN: 23 mg/dL — AB (ref 6–20)
BUN: 26 mg/dL — ABNORMAL HIGH (ref 6–20)
CALCIUM ION: 1.02 mmol/L — AB (ref 1.13–1.30)
CHLORIDE: 102 mmol/L (ref 101–111)
CHLORIDE: 104 mmol/L (ref 101–111)
CREATININE: 0.3 mg/dL — AB (ref 0.44–1.00)
CREATININE: 0.4 mg/dL — AB (ref 0.44–1.00)
CREATININE: 0.5 mg/dL (ref 0.44–1.00)
CREATININE: 0.5 mg/dL (ref 0.44–1.00)
Calcium, Ion: 1.15 mmol/L (ref 1.13–1.30)
Calcium, Ion: 0.87 mmol/L — ABNORMAL LOW (ref 1.13–1.30)
Calcium, Ion: 1.16 mmol/L (ref 1.13–1.30)
Calcium, Ion: 1.03 mmol/L — ABNORMAL LOW (ref 1.13–1.30)
Chloride: 106 mmol/L (ref 101–111)
Chloride: 105 mmol/L (ref 101–111)
Chloride: 106 mmol/L (ref 101–111)
Creatinine, Ser: 0.6 mg/dL (ref 0.44–1.00)
GLUCOSE: 125 mg/dL — AB (ref 65–99)
GLUCOSE: 133 mg/dL — AB (ref 65–99)
GLUCOSE: 142 mg/dL — AB (ref 65–99)
Glucose, Bld: 173 mg/dL — ABNORMAL HIGH (ref 65–99)
Glucose, Bld: 152 mg/dL — ABNORMAL HIGH (ref 65–99)
HCT: 23 % — ABNORMAL LOW (ref 36.0–46.0)
HCT: 24 % — ABNORMAL LOW (ref 36.0–46.0)
HCT: 26 % — ABNORMAL LOW (ref 36.0–46.0)
HEMATOCRIT: 35 % — AB (ref 36.0–46.0)
HEMATOCRIT: 31 % — AB (ref 36.0–46.0)
HEMOGLOBIN: 11.9 g/dL — AB (ref 12.0–15.0)
HEMOGLOBIN: 8.8 g/dL — AB (ref 12.0–15.0)
Hemoglobin: 10.5 g/dL — ABNORMAL LOW (ref 12.0–15.0)
Hemoglobin: 7.8 g/dL — ABNORMAL LOW (ref 12.0–15.0)
Hemoglobin: 8.2 g/dL — ABNORMAL LOW (ref 12.0–15.0)
POTASSIUM: 3.7 mmol/L (ref 3.5–5.1)
POTASSIUM: 3.9 mmol/L (ref 3.5–5.1)
POTASSIUM: 4.4 mmol/L (ref 3.5–5.1)
Potassium: 3.7 mmol/L (ref 3.5–5.1)
Potassium: 4.2 mmol/L (ref 3.5–5.1)
SODIUM: 140 mmol/L (ref 135–145)
Sodium: 140 mmol/L (ref 135–145)
Sodium: 140 mmol/L (ref 135–145)
Sodium: 141 mmol/L (ref 135–145)
Sodium: 141 mmol/L (ref 135–145)
TCO2: 23 mmol/L (ref 0–100)
TCO2: 23 mmol/L (ref 0–100)
TCO2: 25 mmol/L (ref 0–100)
TCO2: 26 mmol/L (ref 0–100)
TCO2: 26 mmol/L (ref 0–100)

## 2016-02-18 LAB — POCT I-STAT 3, ART BLOOD GAS (G3+)
ACID-BASE EXCESS: 1 mmol/L (ref 0.0–2.0)
Acid-base deficit: 4 mmol/L — ABNORMAL HIGH (ref 0.0–2.0)
BICARBONATE: 25 meq/L — AB (ref 20.0–24.0)
Bicarbonate: 21.5 mEq/L (ref 20.0–24.0)
O2 Saturation: 100 %
O2 Saturation: 100 %
PCO2 ART: 38.8 mmHg (ref 35.0–45.0)
PH ART: 7.425 (ref 7.350–7.450)
PO2 ART: 233 mmHg — AB (ref 80.0–100.0)
PO2 ART: 415 mmHg — AB (ref 80.0–100.0)
TCO2: 23 mmol/L (ref 0–100)
TCO2: 26 mmol/L (ref 0–100)
pCO2 arterial: 38 mmHg (ref 35.0–45.0)
pH, Arterial: 7.352 (ref 7.350–7.450)

## 2016-02-18 LAB — COMPREHENSIVE METABOLIC PANEL
ALBUMIN: 3.1 g/dL — AB (ref 3.5–5.0)
ALK PHOS: 64 U/L (ref 38–126)
ALT: 50 U/L (ref 14–54)
AST: 59 U/L — AB (ref 15–41)
Anion gap: 9 (ref 5–15)
BILIRUBIN TOTAL: 0.9 mg/dL (ref 0.3–1.2)
BUN: 26 mg/dL — AB (ref 6–20)
CALCIUM: 8.4 mg/dL — AB (ref 8.9–10.3)
CO2: 20 mmol/L — AB (ref 22–32)
CREATININE: 0.84 mg/dL (ref 0.44–1.00)
Chloride: 109 mmol/L (ref 101–111)
GFR calc Af Amer: >60 mL/min (ref >60)
GFR calc non Af Amer: >60 mL/min (ref >60)
GLUCOSE: 176 mg/dL — AB (ref 65–99)
Potassium: 3.7 mmol/L (ref 3.5–5.1)
SODIUM: 138 mmol/L (ref 135–145)
TOTAL PROTEIN: 5.6 g/dL — AB (ref 6.5–8.1)

## 2016-02-18 LAB — BASIC METABOLIC PANEL
Anion gap: 9 (ref 5–15)
BUN: 28 mg/dL — ABNORMAL HIGH (ref 7–25)
BUN: 27 mg/dL — AB (ref 6–20)
CALCIUM: 10.2 mg/dL (ref 8.6–10.4)
CALCIUM: 9.2 mg/dL (ref 8.9–10.3)
CHLORIDE: 104 mmol/L (ref 98–110)
CO2: 34 mmol/L — ABNORMAL HIGH (ref 20–31)
CO2: 24 mmol/L (ref 22–32)
CREATININE: 0.8 mg/dL (ref 0.50–0.99)
CREATININE: 0.97 mg/dL (ref 0.44–1.00)
Chloride: 107 mmol/L (ref 101–111)
GFR calc Af Amer: >60 mL/min (ref >60)
GFR, EST NON AFRICAN AMERICAN: 59 mL/min — AB (ref >60)
GLUCOSE: 156 mg/dL — AB (ref 65–99)
Glucose, Bld: 89 mg/dL (ref 65–99)
POTASSIUM: 3.6 mmol/L (ref 3.5–5.1)
Potassium: 4.9 mmol/L (ref 3.5–5.3)
SODIUM: 144 mmol/L (ref 135–146)
SODIUM: 140 mmol/L (ref 135–145)

## 2016-02-18 LAB — CK TOTAL AND CKMB (NOT AT ARMC)
CK, MB: 6.5 ng/mL — ABNORMAL HIGH (ref 0.5–5.0)
Relative Index: 5.6 — ABNORMAL HIGH (ref 0.0–2.5)
Total CK: 117 U/L (ref 38–234)

## 2016-02-18 LAB — HEMOGLOBIN AND HEMATOCRIT, BLOOD
HEMATOCRIT: 26.2 % — AB (ref 36.0–46.0)
HEMOGLOBIN: 8.6 g/dL — AB (ref 12.0–15.0)

## 2016-02-18 LAB — APTT
APTT: 31 s (ref 24–37)
APTT: 121 s — AB (ref 24–37)

## 2016-02-18 LAB — I-STAT TROPONIN, ED: TROPONIN I, POC: 0.05 ng/mL (ref 0.00–0.08)

## 2016-02-18 LAB — PLATELET COUNT: PLATELETS: 128 10*3/uL — AB (ref 150–400)

## 2016-02-18 LAB — PREPARE RBC (CROSSMATCH)

## 2016-02-18 LAB — PROTIME-INR
INR: 0.93 (ref <1.50)
INR: 1.22 (ref 0.00–1.49)
PROTHROMBIN TIME: 12.6 s (ref 11.6–15.2)
Prothrombin Time: 15.5 seconds — ABNORMAL HIGH (ref 11.6–15.2)

## 2016-02-18 LAB — ABO/RH: ABO/RH(D): O NEG

## 2016-02-18 SURGERY — LEFT HEART CATH AND CORONARY ANGIOGRAPHY
Anesthesia: LOCAL

## 2016-02-18 SURGERY — CORONARY ARTERY BYPASS GRAFTING (CABG)
Anesthesia: General | Site: Chest

## 2016-02-18 MED ORDER — IOPAMIDOL (ISOVUE-370) INJECTION 76%
INTRAVENOUS | Status: DC | PRN
  Administered 2016-02-18: 55 mL via INTRA_ARTERIAL

## 2016-02-18 MED ORDER — LIDOCAINE HCL (PF) 1 % IJ SOLN
INTRAMUSCULAR | Status: AC
  Filled 2016-02-18: qty 30

## 2016-02-18 MED ORDER — BIVALIRUDIN 250 MG IV SOLR
INTRAVENOUS | Status: AC
  Filled 2016-02-18: qty 250

## 2016-02-18 MED ORDER — HEPARIN (PORCINE) IN NACL 100-0.45 UNIT/ML-% IJ SOLN
1000.0000 [IU]/h | INTRAMUSCULAR | Status: DC
  Administered 2016-02-18: 1000 [IU]/h via INTRAVENOUS
  Filled 2016-02-18: qty 250

## 2016-02-18 MED ORDER — FENTANYL CITRATE (PF) 100 MCG/2ML IJ SOLN
INTRAMUSCULAR | Status: AC
  Filled 2016-02-18: qty 2

## 2016-02-18 MED ORDER — FENTANYL CITRATE (PF) 100 MCG/2ML IJ SOLN
INTRAMUSCULAR | Status: DC | PRN
  Administered 2016-02-18 (×6): 250 ug via INTRAVENOUS

## 2016-02-18 MED ORDER — PHENYLEPHRINE HCL 10 MG/ML IJ SOLN
30.0000 ug/min | INTRAVENOUS | Status: DC
  Filled 2016-02-18: qty 2

## 2016-02-18 MED ORDER — AMINOCAPROIC ACID 250 MG/ML IV SOLN
INTRAVENOUS | Status: DC | PRN
  Administered 2016-02-18: 5000 mg via INTRAVENOUS
  Administered 2016-02-18: 1000 mg via INTRAVENOUS

## 2016-02-18 MED ORDER — SODIUM CHLORIDE 0.9 % IV SOLN
INTRAVENOUS | Status: AC
  Administered 2016-02-18: 1000 mL
  Filled 2016-02-18: qty 1000

## 2016-02-18 MED ORDER — DOPAMINE-DEXTROSE 3.2-5 MG/ML-% IV SOLN
0.0000 ug/kg/min | INTRAVENOUS | Status: DC
  Filled 2016-02-18: qty 250

## 2016-02-18 MED ORDER — HEPARIN (PORCINE) IN NACL 2-0.9 UNIT/ML-% IJ SOLN
INTRAMUSCULAR | Status: DC | PRN
  Administered 2016-02-18: 21:00:00

## 2016-02-18 MED ORDER — VANCOMYCIN HCL 10 G IV SOLR
1250.0000 mg | INTRAVENOUS | Status: AC
  Administered 2016-02-18: 1250 mg via INTRAVENOUS
  Filled 2016-02-18: qty 1250

## 2016-02-18 MED ORDER — INSULIN REGULAR HUMAN 100 UNIT/ML IJ SOLN
INTRAMUSCULAR | Status: DC
  Filled 2016-02-18: qty 2.5

## 2016-02-18 MED ORDER — DEXMEDETOMIDINE HCL IN NACL 400 MCG/100ML IV SOLN
0.1000 ug/kg/h | INTRAVENOUS | Status: DC
  Filled 2016-02-18: qty 100

## 2016-02-18 MED ORDER — ETOMIDATE 2 MG/ML IV SOLN
INTRAVENOUS | Status: DC | PRN
  Administered 2016-02-18: 12 mg via INTRAVENOUS

## 2016-02-18 MED ORDER — NITROGLYCERIN IN D5W 200-5 MCG/ML-% IV SOLN
2.0000 ug/min | INTRAVENOUS | Status: DC
  Filled 2016-02-18: qty 250

## 2016-02-18 MED ORDER — INSULIN REGULAR HUMAN 100 UNIT/ML IJ SOLN
250.0000 [IU] | INTRAMUSCULAR | Status: DC | PRN
  Administered 2016-02-18: 2.3 [IU]/h via INTRAVENOUS

## 2016-02-18 MED ORDER — HEPARIN SODIUM (PORCINE) 5000 UNIT/ML IJ SOLN
INTRAMUSCULAR | Status: AC
  Filled 2016-02-18: qty 1

## 2016-02-18 MED ORDER — LACTATED RINGERS IV SOLN
INTRAVENOUS | Status: DC | PRN
  Administered 2016-02-18 (×2): via INTRAVENOUS

## 2016-02-18 MED ORDER — LIDOCAINE HCL (CARDIAC) 20 MG/ML IV SOLN
INTRAVENOUS | Status: DC | PRN
  Administered 2016-02-18: 80 mg via INTRAVENOUS

## 2016-02-18 MED ORDER — HEPARIN SODIUM (PORCINE) 1000 UNIT/ML IJ SOLN
INTRAMUSCULAR | Status: DC | PRN
  Administered 2016-02-18: 22000 [IU] via INTRAVENOUS

## 2016-02-18 MED ORDER — SODIUM CHLORIDE 0.9 % IV SOLN
INTRAVENOUS | Status: DC
  Filled 2016-02-18: qty 30

## 2016-02-18 MED ORDER — PROTAMINE SULFATE 10 MG/ML IV SOLN
INTRAVENOUS | Status: DC | PRN
  Administered 2016-02-18: 220 mg via INTRAVENOUS

## 2016-02-18 MED ORDER — NITROGLYCERIN IN D5W 200-5 MCG/ML-% IV SOLN
INTRAVENOUS | Status: DC | PRN
  Administered 2016-02-18: 16.6 ug/min via INTRAVENOUS

## 2016-02-18 MED ORDER — HEPARIN SODIUM (PORCINE) 1000 UNIT/ML IJ SOLN
INTRAMUSCULAR | Status: AC
  Filled 2016-02-18: qty 1

## 2016-02-18 MED ORDER — DEXMEDETOMIDINE HCL IN NACL 200 MCG/50ML IV SOLN
INTRAVENOUS | Status: DC | PRN
  Administered 2016-02-18: .5 ug/kg/h via INTRAVENOUS

## 2016-02-18 MED ORDER — VERAPAMIL HCL 2.5 MG/ML IV SOLN
INTRAVENOUS | Status: AC
  Filled 2016-02-18: qty 2

## 2016-02-18 MED ORDER — HEPARIN SODIUM (PORCINE) 1000 UNIT/ML IJ SOLN
4000.0000 [IU] | Freq: Once | INTRAMUSCULAR | Status: AC
  Administered 2016-02-18: 4000 [IU] via INTRAVENOUS
  Filled 2016-02-18: qty 4

## 2016-02-18 MED ORDER — MIDAZOLAM HCL 2 MG/2ML IJ SOLN
INTRAMUSCULAR | Status: DC | PRN
  Administered 2016-02-18: 1 mg via INTRAVENOUS

## 2016-02-18 MED ORDER — ONDANSETRON HCL 4 MG/2ML IJ SOLN
4.0000 mg | Freq: Once | INTRAMUSCULAR | Status: AC
  Administered 2016-02-18: 4 mg via INTRAVENOUS
  Filled 2016-02-18: qty 2

## 2016-02-18 MED ORDER — MIDAZOLAM HCL 5 MG/5ML IJ SOLN
INTRAMUSCULAR | Status: DC | PRN
  Administered 2016-02-18: 4 mg via INTRAVENOUS
  Administered 2016-02-18: 6 mg via INTRAVENOUS

## 2016-02-18 MED ORDER — SUCCINYLCHOLINE CHLORIDE 20 MG/ML IJ SOLN
INTRAMUSCULAR | Status: DC | PRN
  Administered 2016-02-18: 120 mg via INTRAVENOUS

## 2016-02-18 MED ORDER — LACTATED RINGERS IV SOLN
INTRAVENOUS | Status: DC | PRN
  Administered 2016-02-18: 21:00:00 via INTRAVENOUS

## 2016-02-18 MED ORDER — IOPAMIDOL (ISOVUE-370) INJECTION 76%
INTRAVENOUS | Status: AC
  Filled 2016-02-18: qty 100

## 2016-02-18 MED ORDER — EPINEPHRINE HCL 1 MG/ML IJ SOLN
0.0000 ug/min | INTRAMUSCULAR | Status: DC
  Filled 2016-02-18: qty 4

## 2016-02-18 MED ORDER — NOREPINEPHRINE BITARTRATE 1 MG/ML IV SOLN
0.0000 ug/min | INTRAVENOUS | Status: DC
  Filled 2016-02-18: qty 4

## 2016-02-18 MED ORDER — ROCURONIUM BROMIDE 100 MG/10ML IV SOLN
INTRAVENOUS | Status: DC | PRN
  Administered 2016-02-18: 30 mg via INTRAVENOUS
  Administered 2016-02-18: 50 mg via INTRAVENOUS
  Administered 2016-02-19: 20 mg via INTRAVENOUS

## 2016-02-18 MED ORDER — CEFUROXIME SODIUM 1.5 G IJ SOLR
1.5000 g | INTRAMUSCULAR | Status: DC | PRN
  Administered 2016-02-18: 1.5 g via INTRAVENOUS

## 2016-02-18 MED ORDER — MAGNESIUM SULFATE 50 % IJ SOLN
40.0000 meq | INTRAMUSCULAR | Status: DC
  Filled 2016-02-18: qty 10

## 2016-02-18 MED ORDER — DEXTROSE 5 % IV SOLN
750.0000 mg | INTRAVENOUS | Status: DC
  Filled 2016-02-18: qty 750

## 2016-02-18 MED ORDER — HEPARIN SODIUM (PORCINE) 1000 UNIT/ML IJ SOLN
INTRAMUSCULAR | Status: DC | PRN
  Administered 2016-02-18: 3000 [IU] via INTRAVENOUS
  Administered 2016-02-18: 2000 [IU] via INTRAVENOUS

## 2016-02-18 MED ORDER — SODIUM CHLORIDE 0.9 % IV SOLN
INTRAVENOUS | Status: DC
  Filled 2016-02-18: qty 40

## 2016-02-18 MED ORDER — CEFUROXIME SODIUM 1.5 G IJ SOLR
1.5000 g | INTRAMUSCULAR | Status: AC
  Administered 2016-02-18: 750 mg via INTRAVENOUS
  Filled 2016-02-18: qty 1.5

## 2016-02-18 MED ORDER — POTASSIUM CHLORIDE 2 MEQ/ML IV SOLN
80.0000 meq | INTRAVENOUS | Status: DC
  Filled 2016-02-18: qty 40

## 2016-02-18 MED ORDER — VERAPAMIL HCL 2.5 MG/ML IV SOLN
INTRAVENOUS | Status: DC | PRN
  Administered 2016-02-18: 20:00:00 via INTRA_ARTERIAL

## 2016-02-18 MED ORDER — LIDOCAINE HCL (PF) 1 % IJ SOLN
INTRAMUSCULAR | Status: DC | PRN
  Administered 2016-02-18: 20:00:00

## 2016-02-18 MED ORDER — HEPARIN (PORCINE) IN NACL 2-0.9 UNIT/ML-% IJ SOLN
INTRAMUSCULAR | Status: AC
  Filled 2016-02-18: qty 1000

## 2016-02-18 MED ORDER — ATROPINE SULFATE 1 MG/ML IJ SOLN
INTRAMUSCULAR | Status: DC | PRN
  Administered 2016-02-18: .5 mg via INTRAVENOUS

## 2016-02-18 MED ORDER — SODIUM CHLORIDE 0.9 % IJ SOLN
OROMUCOSAL | Status: DC | PRN
  Administered 2016-02-18 (×2): 1 mL via TOPICAL
  Administered 2016-02-18: 23:00:00 via TOPICAL

## 2016-02-18 MED ORDER — PLASMA-LYTE 148 IV SOLN
INTRAVENOUS | Status: AC
  Administered 2016-02-18: 500 mL
  Filled 2016-02-18: qty 2.5

## 2016-02-18 MED ORDER — MIDAZOLAM HCL 2 MG/2ML IJ SOLN
INTRAMUSCULAR | Status: AC
  Filled 2016-02-18: qty 2

## 2016-02-18 MED ORDER — SODIUM CHLORIDE 0.9 % IV SOLN
INTRAVENOUS | Status: DC | PRN
  Administered 2016-02-18: 500 mL via INTRAVENOUS

## 2016-02-18 SURGICAL SUPPLY — 98 items
BAG DECANTER FOR FLEXI CONT (MISCELLANEOUS) ×4 IMPLANT
BANDAGE ELASTIC 4 VELCRO ST LF (GAUZE/BANDAGES/DRESSINGS) ×2 IMPLANT
BANDAGE ELASTIC 6 VELCRO ST LF (GAUZE/BANDAGES/DRESSINGS) ×2 IMPLANT
BASKET HEART (ORDER IN 25'S) (MISCELLANEOUS) ×1
BASKET HEART (ORDER IN 25S) (MISCELLANEOUS) ×1 IMPLANT
BLADE STERNUM SYSTEM 6 (BLADE) ×2 IMPLANT
BLADE SURG ROTATE 9660 (MISCELLANEOUS) IMPLANT
BNDG GAUZE ELAST 4 BULKY (GAUZE/BANDAGES/DRESSINGS) ×2 IMPLANT
CANISTER SUCTION 2500CC (MISCELLANEOUS) ×2 IMPLANT
CANNULA EZ GLIDE AORTIC 21FR (CANNULA) ×4 IMPLANT
CATH CPB KIT OWEN (MISCELLANEOUS) ×2 IMPLANT
CATH THORACIC 36FR (CATHETERS) ×2 IMPLANT
CLIP RETRACTION 3.0MM CORONARY (MISCELLANEOUS) ×2 IMPLANT
CLIP TI MEDIUM 24 (CLIP) IMPLANT
CLIP TI WIDE RED SMALL 24 (CLIP) IMPLANT
COVER BACK TABLE 60X90IN (DRAPES) ×2 IMPLANT
CRADLE DONUT ADULT HEAD (MISCELLANEOUS) ×2 IMPLANT
DRAIN CHANNEL 32F RND 10.7 FF (WOUND CARE) ×4 IMPLANT
DRAPE CARDIOVASCULAR INCISE (DRAPES) ×1
DRAPE INCISE IOBAN 66X45 STRL (DRAPES) ×2 IMPLANT
DRAPE PROXIMA HALF (DRAPES) ×2 IMPLANT
DRAPE SLUSH/WARMER DISC (DRAPES) ×2 IMPLANT
DRAPE SRG 135X102X78XABS (DRAPES) ×1 IMPLANT
DRSG AQUACEL AG ADV 3.5X14 (GAUZE/BANDAGES/DRESSINGS) ×2 IMPLANT
DRSG COVADERM 4X14 (GAUZE/BANDAGES/DRESSINGS) ×2 IMPLANT
DRSG TEGADERM 2-3/8X2-3/4 SM (GAUZE/BANDAGES/DRESSINGS) ×2 IMPLANT
ELECT BLADE 4.0 EZ CLEAN MEGAD (MISCELLANEOUS) ×2
ELECT REM PT RETURN 9FT ADLT (ELECTROSURGICAL) ×4
ELECTRODE BLDE 4.0 EZ CLN MEGD (MISCELLANEOUS) ×1 IMPLANT
ELECTRODE REM PT RTRN 9FT ADLT (ELECTROSURGICAL) ×2 IMPLANT
FELT TEFLON 1X6 (MISCELLANEOUS) ×2 IMPLANT
GAUZE SPONGE 4X4 12PLY STRL (GAUZE/BANDAGES/DRESSINGS) ×4 IMPLANT
GLOVE ORTHO TXT STRL SZ7.5 (GLOVE) ×4 IMPLANT
GOWN STRL REUS W/ TWL LRG LVL3 (GOWN DISPOSABLE) ×4 IMPLANT
GOWN STRL REUS W/TWL LRG LVL3 (GOWN DISPOSABLE) ×4
HEMOSTAT POWDER SURGIFOAM 1G (HEMOSTASIS) ×6 IMPLANT
INSERT FOGARTY XLG (MISCELLANEOUS) ×2 IMPLANT
KIT BASIN OR (CUSTOM PROCEDURE TRAY) ×2 IMPLANT
KIT ROOM TURNOVER OR (KITS) ×2 IMPLANT
KIT SUCTION CATH 14FR (SUCTIONS) ×6 IMPLANT
KIT VASOVIEW 6 PRO VH 2400 (KITS) ×2 IMPLANT
LEAD PACING MYOCARDI (MISCELLANEOUS) ×2 IMPLANT
LINE EXTENSION DELIVERY (MISCELLANEOUS) ×2 IMPLANT
MARKER GRAFT CORONARY BYPASS (MISCELLANEOUS) ×6 IMPLANT
NS IRRIG 1000ML POUR BTL (IV SOLUTION) ×10 IMPLANT
PACK OPEN HEART (CUSTOM PROCEDURE TRAY) ×2 IMPLANT
PAD ARMBOARD 7.5X6 YLW CONV (MISCELLANEOUS) ×4 IMPLANT
PAD ELECT DEFIB RADIOL ZOLL (MISCELLANEOUS) ×2 IMPLANT
PENCIL BUTTON HOLSTER BLD 10FT (ELECTRODE) ×2 IMPLANT
PUNCH AORTIC ROTATE  4.5MM 8IN (MISCELLANEOUS) ×2 IMPLANT
PUNCH AORTIC ROTATE 4.0MM (MISCELLANEOUS) IMPLANT
PUNCH AORTIC ROTATE 4.5MM 8IN (MISCELLANEOUS) IMPLANT
PUNCH AORTIC ROTATE 5MM 8IN (MISCELLANEOUS) IMPLANT
SET CARDIOPLEGIA MPS 5001102 (MISCELLANEOUS) ×2 IMPLANT
SOLUTION ANTI FOG 6CC (MISCELLANEOUS) IMPLANT
SPONGE GAUZE 4X4 12PLY STER LF (GAUZE/BANDAGES/DRESSINGS) ×4 IMPLANT
SPONGE LAP 18X18 X RAY DECT (DISPOSABLE) IMPLANT
SPONGE LAP 4X18 X RAY DECT (DISPOSABLE) IMPLANT
SUT BONE WAX W31G (SUTURE) ×2 IMPLANT
SUT ETHIBOND X763 2 0 SH 1 (SUTURE) ×8 IMPLANT
SUT MNCRL AB 3-0 PS2 18 (SUTURE) ×8 IMPLANT
SUT MNCRL AB 4-0 PS2 18 (SUTURE) ×2 IMPLANT
SUT PDS AB 1 CTX 36 (SUTURE) ×12 IMPLANT
SUT PROLENE 2 0 SH DA (SUTURE) IMPLANT
SUT PROLENE 3 0 SH DA (SUTURE) ×2 IMPLANT
SUT PROLENE 3 0 SH1 36 (SUTURE) IMPLANT
SUT PROLENE 4 0 RB 1 (SUTURE) ×1
SUT PROLENE 4 0 SH DA (SUTURE) ×4 IMPLANT
SUT PROLENE 4-0 RB1 .5 CRCL 36 (SUTURE) ×1 IMPLANT
SUT PROLENE 5 0 C 1 36 (SUTURE) IMPLANT
SUT PROLENE 6 0 C 1 30 (SUTURE) ×2 IMPLANT
SUT PROLENE 7.0 RB 3 (SUTURE) ×6 IMPLANT
SUT PROLENE 8 0 BV175 6 (SUTURE) IMPLANT
SUT PROLENE BLUE 7 0 (SUTURE) ×4 IMPLANT
SUT PROLENE POLY MONO (SUTURE) IMPLANT
SUT SILK  1 MH (SUTURE) ×2
SUT SILK 1 MH (SUTURE) ×2 IMPLANT
SUT STEEL 6MS V (SUTURE) IMPLANT
SUT STEEL STERNAL CCS#1 18IN (SUTURE) IMPLANT
SUT STEEL SZ 6 DBL 3X14 BALL (SUTURE) IMPLANT
SUT VIC AB 1 CTX 36 (SUTURE)
SUT VIC AB 1 CTX36XBRD ANBCTR (SUTURE) IMPLANT
SUT VIC AB 2-0 CT1 27 (SUTURE) ×1
SUT VIC AB 2-0 CT1 TAPERPNT 27 (SUTURE) ×1 IMPLANT
SUT VIC AB 2-0 CTX 27 (SUTURE) IMPLANT
SUT VIC AB 3-0 SH 27 (SUTURE)
SUT VIC AB 3-0 SH 27X BRD (SUTURE) IMPLANT
SUT VIC AB 3-0 X1 27 (SUTURE) IMPLANT
SUT VICRYL 4-0 PS2 18IN ABS (SUTURE) IMPLANT
SUTURE E-PAK OPEN HEART (SUTURE) ×2 IMPLANT
SYSTEM SAHARA CHEST DRAIN ATS (WOUND CARE) ×2 IMPLANT
TAPE CLOTH SURG 4X10 WHT LF (GAUZE/BANDAGES/DRESSINGS) ×2 IMPLANT
TOWEL OR 17X24 6PK STRL BLUE (TOWEL DISPOSABLE) ×4 IMPLANT
TOWEL OR 17X26 10 PK STRL BLUE (TOWEL DISPOSABLE) ×4 IMPLANT
TRAY FOLEY IC TEMP SENS 16FR (CATHETERS) ×2 IMPLANT
TUBING INSUFFLATION (TUBING) ×2 IMPLANT
UNDERPAD 30X30 INCONTINENT (UNDERPADS AND DIAPERS) ×2 IMPLANT
WATER STERILE IRR 1000ML POUR (IV SOLUTION) ×4 IMPLANT

## 2016-02-18 SURGICAL SUPPLY — 13 items
BALLN LINEAR 7.5FR IABP 34CC (BALLOONS) ×3
BALLOON LINEAR 7.5FR IABP 34CC (BALLOONS) ×2 IMPLANT
CATH INFINITI 5FR ANG PIGTAIL (CATHETERS) ×3 IMPLANT
CATH OPTITORQUE TIG 4.0 5F (CATHETERS) ×3 IMPLANT
DEVICE SECURE STATLOCK IABP (MISCELLANEOUS) ×6 IMPLANT
GLIDESHEATH SLEND A-KIT 6F 22G (SHEATH) ×3 IMPLANT
KIT HEART LEFT (KITS) ×3 IMPLANT
PACK CARDIAC CATHETERIZATION (CUSTOM PROCEDURE TRAY) ×3 IMPLANT
SHEATH PINNACLE 6F 10CM (SHEATH) ×3 IMPLANT
TRANSDUCER W/STOPCOCK (MISCELLANEOUS) ×9 IMPLANT
TUBING CIL FLEX 10 FLL-RA (TUBING) ×3 IMPLANT
WIRE EMERALD 3MM-J .035X150CM (WIRE) ×3 IMPLANT
WIRE SAFE-T 1.5MM-J .035X260CM (WIRE) ×3 IMPLANT

## 2016-02-18 NOTE — Anesthesia Procedure Notes (Addendum)
Procedure Name: Intubation Date/Time: 02/18/2016 8:58 PM Performed by: Valetta Fuller Pre-anesthesia Checklist: Patient identified, Emergency Drugs available, Suction available and Patient being monitored Patient Re-evaluated:Patient Re-evaluated prior to inductionOxygen Delivery Method: Circle system utilized Preoxygenation: Pre-oxygenation with 100% oxygen Intubation Type: IV induction, Rapid sequence and Cricoid Pressure applied Laryngoscope Size: Miller and 2 Grade View: Grade I Tube type: Oral Tube size: 8.0 mm Number of attempts: 1 Airway Equipment and Method: Stylet Placement Confirmation: ETT inserted through vocal cords under direct vision,  positive ETCO2 and breath sounds checked- equal and bilateral Secured at: 23 cm Tube secured with: Tape Dental Injury: Teeth and Oropharynx as per pre-operative assessment    Central Venous Catheter Insertion Performed by: anesthesiologist Patient location: Pre-op. Preanesthetic checklist: patient identified, IV checked, site marked, risks and benefits discussed, surgical consent, monitors and equipment checked, pre-op evaluation, timeout performed and anesthesia consent Landmarks identified Catheter size: 8.5 Fr PA cath was placed.Swan type and PA catheter depth:thermodilationProcedure performed using ultrasound guided technique. Attempts: 1 Patient tolerated the procedure well with no immediate complications.

## 2016-02-18 NOTE — ED Notes (Signed)
Pt c/o CP - scheduled cath tomorrow. Pt took 3 nitro at home, pain 9/10 after nitro. 345mg  ASA given. Pt became nauseated and vomited with EMS. EMS reports BP 88/52 gave 52mL bolus BP improved to 102/68, HR 40's. Pt alert and oriented. Concerning EKG with ST elevation per EMS.

## 2016-02-18 NOTE — ED Provider Notes (Signed)
CSN: [id number]     Arrival date & time 02/18/16  1842 History   First MD Initiated Contact with Patient 02/18/16 1859     Chief Complaint  Patient presents with  . Chest Pain     (Consider location/radiation/quality/duration/timing/severity/associated sxs/prior Treatment) HPI   [name] is a 70 year old female with a history of MI, hyperlipidemia, who presents today complaining of chest pain. Chest pain began at approximately 5 pm.  And she took 3 nitro and 4 baby aspirin without relief.  She states she has been having chest pain for about the past week and she is scheduled for a catheterization tomorrow. Pain is currently 9 out of 10. She became nauseated and vomited route. She denies dyspnea.  Past Medical History  Diagnosis Date  . Hyperlipidemia   . MI (myocardial infarction) ([place])     Inferior treated with TPA 1991, POBA RCA 1996  . RAS (renal artery stenosis) ([place])     Right renal stent 2005  . Unstable angina ([place]) 02/18/2016  . CAD (coronary artery disease) 06/04/2013   Past Surgical History  Procedure Laterality Date  . Rhinoplasty    . Tonsillectomy and adenoidectomy     Family History  Problem Relation Age of Onset  . Heart attack Father 92    Fatal MI 53  . Heart attack Brother 69   Social History  Substance Use Topics  . Smoking status: Former Smoker -- 1.00 packs/day for 40 years    Types: Cigarettes    Quit date: 06/05/2011  . Smokeless tobacco: Former Systems developer  . Alcohol Use: None   OB History    No data available     Review of Systems  All other systems reviewed and are negative.     Allergies  Review of patient's allergies indicates no known allergies.  Home Medications   Prior to Admission medications   Medication Sig Start Date End Date Taking? Authorizing Provider  aspirin 81 MG tablet Take 81 mg by mouth daily.    Historical Provider, MD  atenolol (TENORMIN) 25 MG tablet Take 12.5 mg by mouth daily.    Historical Provider, MD   atorvastatin (LIPITOR) 80 MG tablet Take 80 mg by mouth daily.    Historical Provider, MD  calcium-vitamin D (OSCAL WITH D) 500-200 MG-UNIT per tablet Take 1 tablet by mouth 2 (two) times daily.    Historical Provider, MD  clobetasol cream (TEMOVATE) AB-123456789 % Apply 1 application topically 2 (two) times daily as needed.     Historical Provider, MD  Co-Enzyme Q10 200 MG CAPS Take 1 capsule by mouth daily.     Historical Provider, MD  isosorbide mononitrate (IMDUR) 60 MG 24 hr tablet Take 1 tablet (60 mg total) by mouth daily. 02/17/16   Skeet Latch, MD  Magnesium 500 MG TABS Take 1 tablet by mouth daily.     Historical Provider, MD  metFORMIN (GLUCOPHAGE-XR) 500 MG 24 hr tablet Take 1 tablet by mouth every evening.  07/02/15   Historical Provider, MD  Multiple Vitamin (MULTIVITAMIN) tablet Take 1 tablet by mouth daily.    Historical Provider, MD  nitroGLYCERIN (NITROSTAT) 0.4 MG SL tablet Place 1 tablet (0.4 mg total) under the tongue every 5 (five) minutes as needed for chest pain. 08/13/15   Minus Breeding, MD   BP 104/66 mmHg  Temp(Src) 97.3 F (36.3 C) (Oral)  Ht 5\' 6"  (1.676 m)  Wt 74.39 kg  BMI 26.48 kg/m2  SpO2 97% Physical Exam  Constitutional: She is  oriented to person, place, and time. She appears well-developed and well-nourished.  HENT:  Head: Normocephalic and atraumatic.  Right Ear: External ear normal.  Left Ear: External ear normal.  Nose: Nose normal.  Mouth/Throat: Oropharynx is clear and moist.  Eyes: Conjunctivae and EOM are normal. Pupils are equal, round, and reactive to light.  Neck: Normal range of motion.  Cardiovascular: Bradycardia present.   Pulmonary/Chest: Effort normal and breath sounds normal.  Abdominal: Soft.  Musculoskeletal: Normal range of motion. She exhibits no edema.  Neurological: She is alert and oriented to person, place, and time.  Skin: Skin is warm and dry.  Psychiatric: She has a normal mood and affect. Her behavior is normal. Judgment  and thought content normal.  Nursing note and vitals reviewed.   ED Course  Procedures (including critical care time) Labs Review Labs Reviewed  BASIC METABOLIC PANEL  [place], ED    Imaging Review No results found. I have personally reviewed and evaluated these images and lab results as part of my medical decision-making.   EKG Interpretation   Date/Time:  Thursday February 18 2016 18:47:07 EDT Ventricular Rate:  47 PR Interval:  182 QRS Duration: 90 QT Interval:  493 QTC Calculation: 436 R Axis:   55 Text Interpretation:  Sinus bradycardia Atrial premature complex Probable  left atrial enlargement Probable anteroseptal infarct, recent Lateral  leads are also involved st elevation 1, avl, v1,v2 Confirmed by Halla Chopp MD,  Andee Poles [id number]) on 02/18/2016 7:12:57 PM      MDM   Final diagnoses:  Chest pain  ST elevation myocardial infarction (STEMI), unspecified artery ([place])    70 year old female with known coronary artery disease and chest pain was initially hypotensive per EMS prehospital. She received a 500 mL bolus. Here she has been normotensive. She continues bradycardic. She is on a beta blocker. I discussed the patient's care with Dr. Burt Knack and she is to be taken directly to the Cath Lab.  Pattricia Boss, MD 02/18/16 [phone]

## 2016-02-18 NOTE — Interval H&P Note (Signed)
History and Physical Interval Note:  02/18/2016 8:33 PM  Hailey Wright  has presented today for surgery, with the diagnosis of Lateral STEMI  The various methods of treatment have been discussed with the patient and family. After consideration of risks, benefits and other options for treatment, the patient has consented to  Procedure(s): Left Heart Cath and Coronary Angiography (N/A) as a surgical intervention .  The patient's history has been reviewed, patient examined, no change in status, stable for surgery.  I have reviewed the patient's chart and labs.  Questions were answered to the patient's satisfaction.     HARDING, DAVID W

## 2016-02-18 NOTE — Brief Op Note (Addendum)
02/18/2016  11:07 PM  PATIENT:  Hailey Wright  70 y.o. female  PRE-OPERATIVE DIAGNOSIS:  1. STEMI 2. CORONARY ARTERY DISEASE  POST-OPERATIVE DIAGNOSIS: 1. STEMI 2. CORONARY ARTERY DISEASE  PROCEDURE:  INTRA OP TEE, EMERGENT MEDIAN STERNOTOMY for CORONARY ARTERY BYPASS GRAFTING (CABG) x 2 (LIMA to LAD and SVG to OM1) using left internal mammary and right thigh greater saphenous vein    SURGEON:    Rexene Alberts, MD  ASSISTANTS:  Nani Skillern, PA-C  ANESTHESIA:   Myrtie Soman, MD  CROSSCLAMP TIME:   46'  CARDIOPULMONARY BYPASS TIME: 16'  FINDINGS:  Acute anterior wall myocardial infarction  Severe hypokinesis/akinesis of distal anterior wall, apex and anterior septum  Good quality LIMA conduit for grafting  Good quality SVG conduit for grafting  Good quality target vessels for grafting  Terminal branches of distal RCA too small and diffusely diseased for grafting  Improved LV function after separation from CPB  COMPLICATIONS: None  BASELINE WEIGHT: 74.3 kg  PATIENT DISPOSITION:   TO SICU IN STABLE CONDITION  Rexene Alberts, MD 02/19/2016 12:27 AM

## 2016-02-18 NOTE — Progress Notes (Signed)
  Echocardiogram Echocardiogram Transesophageal has been performed.  Hailey Wright 02/18/2016, 9:57 PM

## 2016-02-18 NOTE — H&P (Signed)
Patient ID: Hailey Wright MRN: [id number], DOB/AGE: 1946/11/19   Admit date: 02/18/2016   Primary Physician: Donnajean Lopes, MD Primary Cardiologist: Dr. Percival Spanish  Pt. Profile:   pleasant 70 year old Caucasian female with past medical history of right renal artery stent 2005, hyperlipidemia, history of CAD with remote history of VF arrest in 1990s treated with PTCA  Problem List  Past Medical History  Diagnosis Date  . Hyperlipidemia   . MI (myocardial infarction) ([place])     Inferior treated with TPA 1991, POBA RCA 1996  . RAS (renal artery stenosis) ([place])     Right renal stent 2005  . Unstable angina ([place]) 02/18/2016  . CAD (coronary artery disease) 06/04/2013    Past Surgical History  Procedure Laterality Date  . Rhinoplasty    . Tonsillectomy and adenoidectomy       Allergies  No Known Allergies  HPI  The patient is a pleasant 70 year old Caucasian female with past medical history of right renal artery stent 2005, hyperlipidemia, history of CAD with remote history of VF arrest in 1990s treated with PTCA. In 1996, she had a cardiac catheterization that demonstrated mild stenosis of the mid LAD, 50-60% circumflex lesion and a long, diffuse 50% stenosis of the RCA with mid 90% stenosis. She was seen in the cardiology clinic yesterday on 02/17/2016 as she has been having intermittent chest discomfort since this past Saturday on 3/25. She had recurrent chest discomfort on Monday and Tuesday night. She described as that episode as substernal chest discomfort lasting roughly 20 minutes. She tried to take nitroglycerin at home which did help after the second tablet. The pain was substernal and radiated to bilateral arms and into her jaw. She was originally scheduled to undergo cardiac catheterization on 3/31.  She had recurrent chest pain around 5 PM on 02/18/2016, however this time it did not go away, she decided to seek medical attention at [facility]. On EMS arrival, she  was hypotensive. As EMS took her out of the house, she also vomited. She took aspirin prior to EMS arrival, she later received 4 sublingual nitroglycerin which did not take away the chest pain. On arrival to [facility] ED, she still complained of 7 out of 10 chest pain. Her hypotension is better after IV hydration.   She denies any recent bleeding, upcoming surgeries or history of stroke. She denies any contrast dye allergy nor does she have any other allergies.    Home Medications  Prior to Admission medications   Medication Sig Start Date End Date Taking? Authorizing Provider  aspirin 81 MG tablet Take 81 mg by mouth daily.    Historical Provider, MD  atenolol (TENORMIN) 25 MG tablet Take 12.5 mg by mouth daily.    Historical Provider, MD  atorvastatin (LIPITOR) 80 MG tablet Take 80 mg by mouth daily.    Historical Provider, MD  calcium-vitamin D (OSCAL WITH D) 500-200 MG-UNIT per tablet Take 1 tablet by mouth 2 (two) times daily.    Historical Provider, MD  clobetasol cream (TEMOVATE) [id number] % Apply 1 application topically 2 (two) times daily as needed.     Historical Provider, MD  Co-Enzyme Q10 200 MG CAPS Take 1 capsule by mouth daily.     Historical Provider, MD  isosorbide mononitrate (IMDUR) 60 MG 24 hr tablet Take 1 tablet (60 mg total) by mouth daily. 02/17/16   Skeet Latch, MD  Magnesium 500 MG TABS Take 1 tablet by mouth daily.     Historical  Provider, MD  metFORMIN (GLUCOPHAGE-XR) 500 MG 24 hr tablet Take 1 tablet by mouth every evening.  07/02/15   Historical Provider, MD  Multiple Vitamin (MULTIVITAMIN) tablet Take 1 tablet by mouth daily.    Historical Provider, MD  nitroGLYCERIN (NITROSTAT) 0.4 MG SL tablet Place 1 tablet (0.4 mg total) under the tongue every 5 (five) minutes as needed for chest pain. 08/13/15   Minus Breeding, MD    Family History  Family History  Problem Relation Age of Onset  . Heart attack Father 4    Fatal MI 70  . Heart attack Brother 28     Social History  Social History   Social History  . Marital Status: Married    Spouse Name: N/A  . Number of Children: 1  . Years of Education: N/A   Occupational History  . Not on file.   Social History Main Topics  . Smoking status: Former Smoker -- 1.00 packs/day for 40 years    Types: Cigarettes    Quit date: 06/05/2011  . Smokeless tobacco: Former Systems developer  . Alcohol Use: Not on file  . Drug Use: Not on file  . Sexual Activity: Not on file   Other Topics Concern  . Not on file   Social History Narrative   Lives with husband.     Review of Systems General:  No chills, fever, night sweats or weight changes.  Cardiovascular:  No dyspnea on exertion, edema, orthopnea, palpitations, paroxysmal nocturnal dyspnea. +chest pain Dermatological: No rash, lesions/masses Respiratory: No cough, dyspnea Urologic: No hematuria, dysuria Abdominal:   No nausea, vomiting, diarrhea, bright red blood per rectum, melena, or hematemesis Neurologic:  No visual changes, wkns, changes in mental status. All other systems reviewed and are otherwise negative except as noted above.  Physical Exam  Blood pressure 108/70, pulse 45, temperature 97.3 F (36.3 C), temperature source Oral, resp. rate 20, height 5\' 6"  (1.676 m), weight 164 lb (74.39 kg), SpO2 100 %.  General: + Ill-appearing Psych: Normal affect. Neuro: Alert and oriented X 3. Moves all extremities spontaneously. HEENT: Normal  Neck: Supple without bruits or JVD. Lungs:  Resp regular and unlabored, CTA. Heart: bradycardic regular with occasional ectopy. no s3, s4, or murmurs. Abdomen: Soft, non-tender, non-distended, BS + x 4.  Extremities: No clubbing, cyanosis or edema. DP/PT/Radials 2+ and equal bilaterally.  Labs  Troponin [facility] of Care Test)  Recent Labs  02/18/16 1901  TROPIPOC 0.05   No results for input(s): CKTOTAL, CKMB, TROPONINI in the last 72 hours. Lab Results  Component Value Date   WBC 10.4  02/18/2016   HGB 13.3 02/18/2016   HCT 40.4 02/18/2016   MCV 90.8 02/18/2016   PLT 179 02/18/2016    Recent Labs Lab 02/17/16 1527  NA 144  K 4.9  CL 104  CO2 34*  BUN 28*  CREATININE 0.80  CALCIUM 10.2  GLUCOSE 89   No results found for: CHOL, HDL, LDLCALC, TRIG No results found for: DDIMER   Radiology/Studies  No results found.  ECG  Normal sinus rhythm with ST elevation in lead 1 and aVL.   Echocardiogram  Previous echocardiogram on 02/14/2012, EF 70%     ASSESSMENT AND PLAN  1. Lateral STEMI: Onset at 5 PM today, still have 7 out of 10 chest pain. EKG shows ST elevation in 1 and aVL with reciprocal changes in inferior lead.  - Discussed with the patient benefit risk of cardiac catheterization, she will need emergent cardiac cath.  -  Risk and benefit of procedure explained to the patient who display clear understanding and agree to proceed. Discussed with patient possible procedural risk include bleeding, vascular injury, renal injury, arrythmia, MI, stroke and loss of limb or life.  - No obvious contraindication to drug eluding stent. No history of stroke. No recent silent bleeding. No upcoming surgeries.  - Serial troponin, admit to ICU, obtain echocardiogram.  2. CAD with prior PTCA in 1991, cardiac event preceded by VF arrest  3. Hyperlipidemia  4. S/p right renal stent 2005  5. Prediabetes on metformin  6. Bradycardic in the ED in the 40s: hold BB   Signed, Almyra Deforest, PA-C 02/18/2016, 7:28 PM   Personally seen and examined. Agree with above. STEMI lateral -1, L elevation with 2,3,F downsloping- personally viewed. Ill appearing. Hypotension with NTG x 4, IVF bolus No murmur/rub. CTAB Emergent cath. (Was actually going to go tomorrow) Hold bb with brady. Continue statin. No smoking. +FHX with father MI.  CXR personally viewed - no dz.   Discussed case with Dr. Ellyn Hack of interventional cardiology.  Candee Furbish, MD

## 2016-02-18 NOTE — Anesthesia Preprocedure Evaluation (Signed)
Anesthesia Evaluation  Patient identified by MRN, date of birth, ID band Patient awake    Reviewed: Allergy & Precautions, NPO status , Patient's Chart, lab work & pertinent test results  Airway Mallampati: II  TM Distance: >3 FB Neck ROM: Full    Dental no notable dental hx.    Pulmonary neg pulmonary ROS, former smoker,    Pulmonary exam normal breath sounds clear to auscultation       Cardiovascular + CAD, + Past MI and + Peripheral Vascular Disease  Normal cardiovascular exam Rhythm:Regular Rate:Normal  Acute evolving MI, w/IABP from cath lab   Neuro/Psych negative neurological ROS  negative psych ROS   GI/Hepatic negative GI ROS, Neg liver ROS,   Endo/Other  negative endocrine ROS  Renal/GU negative Renal ROS  negative genitourinary   Musculoskeletal negative musculoskeletal ROS (+)   Abdominal   Peds negative pediatric ROS (+)  Hematology negative hematology ROS (+)   Anesthesia Other Findings   Reproductive/Obstetrics negative OB ROS                             Anesthesia Physical Anesthesia Plan  ASA: IV and emergent  Anesthesia Plan: General   Post-op Pain Management:    Induction: Intravenous  Airway Management Planned: Oral ETT  Additional Equipment: Arterial line, PA Cath, Ultrasound Guidance Line Placement and TEE  Intra-op Plan:   Post-operative Plan: Post-operative intubation/ventilation  Informed Consent: I have reviewed the patients History and Physical, chart, labs and discussed the procedure including the risks, benefits and alternatives for the proposed anesthesia with the patient or authorized representative who has indicated his/her understanding and acceptance.   Dental advisory given  Plan Discussed with: CRNA and Surgeon  Anesthesia Plan Comments:         Anesthesia Quick Evaluation

## 2016-02-18 NOTE — Consult Note (Signed)
[address]       [place] [address]             [id number]          CARDIOTHORACIC SURGERY CONSULTATION REPORT  PCP is Donnajean Lopes, MD Referring Provider is [facility], Leonie Green, MD Primary Cardiologist is Minus Breeding, MD  Reason for consultation:  Left main and 3-vessel CAD with acute evolving ST-segment elevation myocardial infarction  HPI:  The patient is a 70 year old female with history of coronary artery disease status post acute inferior wall myocardial infarction complicated by ventricular fibrillation arrest in the remote past, hyperlipidemia, and borderline type 2 diabetes mellitus who is referred for emergent cardiac surgical consultation secondary to left main disease with critical three-vessel coronary artery disease and acute evolving ST segment elevation myocardial infarction complicated by cardiogenic shock. The patient suffered an acute myocardial infarction in 1991 that Presented with VF arrest. She was treated with TPA. In 1996 diagnostic cardiac catheterization was performed revealing multivessel coronary artery disease including 90% stenosis of mid right coronary artery. She was treated with balloon angioplasty. She has been followed for many years by Dr. Percival Spanish who saw her most recently in October 2016.  At that time she was doing well.  Over the past week the patient has had accelerating symptoms of angina pectoris.  She had 2 episodes of nocturnal chest pain that awoke her from her sleep. She took sublingual nitroglycerin with some relief. The pain was quite severe and prompted her to call the cardiology office for an appointment. She was seen in the office yesterday by Dr. Oval Linsey.  Elective diagnostic cardiac catheterization was scheduled for tomorrow.   Late this afternoon the patient developed severe substernal chest pain at rest that persisted despite nitroglycerin.  She took a total of 3 nitroglycerin and 4 baby aspirin without  any relief. EMS was called. She became nauseated and vomited en route. She was hypotensive and resuscitated using volume. EKG revealed acute ST segment elevation.  Code STEMI was activated and the patient was brought rectally to the Cath Lab from the emergency department. Diagnostic cardiac catheterization performed by Dr. Ellyn Hack demonstrates left main disease with critical subtotal ostial occlusion of the left anterior descending coronary artery with acute thrombus. There is acute thrombus in the left circumflex coronary artery. There is chronic occlusion of the right coronary artery with left-to-right collateral filling of the terminal branches of the right coronary artery. There is severe hypokinesis or akinesis of the entire distal anterior wall and apex. The patient is hypertensive in her minimally in the Cath Lab. Emergency cardiac surgical consultation was requested and an intra-aortic balloon pump was placed from the right femoral approach.  Upon my arrival following intra-aortic balloon pump the patient reports slight improvement in chest pain. She is breathing comfortably on nasal cannula oxygen. She states that prior to the onset of symptoms of angina approximately one week ago she was otherwise in her usual state of good health. She is married and lives with her husband locally in [place]. She has been retired for nearly 20 years, having previously been in the [employer]. She lives active lifestyle and exercises regularly. She reports no significant limitations up until recently. She specifically denies any history of shortness breath, orthopnea, PND, or lower extremity edema prior to the development of her acute onset of chest pain this week.  Past Medical History  Diagnosis Date  . Hyperlipidemia   . MI (  myocardial infarction) ([place])     Inferior treated with TPA 1991, POBA RCA 1996  . RAS (renal artery stenosis) ([place])     Right renal stent 2005  . Unstable angina ([place])  02/18/2016  . CAD (coronary artery disease) 06/04/2013    Past Surgical History  Procedure Laterality Date  . Rhinoplasty    . Tonsillectomy and adenoidectomy      Family History  Problem Relation Age of Onset  . Heart attack Father 70    Fatal MI 23  . Heart attack Brother 53    Social History   Social History  . Marital Status: Married    Spouse Name: N/A  . Number of Children: 1  . Years of Education: N/A   Occupational History  . Not on file.   Social History Main Topics  . Smoking status: Former Smoker -- 1.00 packs/day for 40 years    Types: Cigarettes    Quit date: 06/05/2011  . Smokeless tobacco: Former Systems developer  . Alcohol Use: Not on file  . Drug Use: Not on file  . Sexual Activity: Not on file   Other Topics Concern  . Not on file   Social History Narrative   Lives with husband.    Prior to Admission medications   Medication Sig Start Date End Date Taking? Authorizing Provider  aspirin 81 MG tablet Take 81 mg by mouth daily.    Historical Provider, MD  atenolol (TENORMIN) 25 MG tablet Take 12.5 mg by mouth daily.    Historical Provider, MD  atorvastatin (LIPITOR) 80 MG tablet Take 80 mg by mouth daily.    Historical Provider, MD  calcium-vitamin D (OSCAL WITH D) 500-200 MG-UNIT per tablet Take 1 tablet by mouth 2 (two) times daily.    Historical Provider, MD  clobetasol cream (TEMOVATE) 7.82 % Apply 1 application topically 2 (two) times daily as needed.     Historical Provider, MD  Co-Enzyme Q10 200 MG CAPS Take 1 capsule by mouth daily.     Historical Provider, MD  isosorbide mononitrate (IMDUR) 60 MG 24 hr tablet Take 1 tablet (60 mg total) by mouth daily. 02/17/16   Skeet Latch, MD  Magnesium 500 MG TABS Take 1 tablet by mouth daily.     Historical Provider, MD  metFORMIN (GLUCOPHAGE-XR) 500 MG 24 hr tablet Take 1 tablet by mouth every evening.  07/02/15   Historical Provider, MD  Multiple Vitamin (MULTIVITAMIN) tablet Take 1 tablet by mouth daily.     Historical Provider, MD  nitroGLYCERIN (NITROSTAT) 0.4 MG SL tablet Place 1 tablet (0.4 mg total) under the tongue every 5 (five) minutes as needed for chest pain. 08/13/15   Minus Breeding, MD    Current Facility-Administered Medications  Medication Dose Route Frequency Provider Last Rate Last Dose  . 0.9 %  sodium chloride infusion    Continuous PRN Leonie Man, MD 500 mL/hr at 02/18/16 1943 500 mL at 02/18/16 1943  . aminocaproic acid (AMICAR) 10 g in sodium chloride 0.9 % 100 mL infusion   Intravenous To OR Rexene Alberts, MD      . cefUROXime (ZINACEF) 1.5 g in dextrose 5 % 50 mL IVPB  1.5 g Intravenous To OR Rexene Alberts, MD      . cefUROXime (ZINACEF) 750 mg in dextrose 5 % 50 mL IVPB  750 mg Intravenous To OR Rexene Alberts, MD      . dexmedetomidine (PRECEDEX) 400 MCG/100ML (4 mcg/mL) infusion  0.1-0.7 mcg/kg/hr  Intravenous To OR Rexene Alberts, MD      . DOPamine (INTROPIN) 800 mg in dextrose 5 % 250 mL (3.2 mg/mL) infusion  0-10 mcg/kg/min Intravenous To OR Rexene Alberts, MD      . EPINEPHrine (ADRENALIN) 4 mg in dextrose 5 % 250 mL (0.016 mg/mL) infusion  0-10 mcg/min Intravenous To OR Rexene Alberts, MD      . heparin 1,500 mL, lidocaine (PF) (XYLOCAINE) 1 % 25 mL    PRN Leonie Man, MD      . heparin 2,500 Units, papaverine 30 mg in electrolyte-148 (PLASMALYTE-148) 500 mL irrigation   Irrigation To OR Rexene Alberts, MD      . heparin 30,000 units/NS 1000 mL solution for CELLSAVER   Other To OR Rexene Alberts, MD      . heparin 5000 UNIT/ML injection           . heparin ADULT infusion 100 units/mL (25000 units/250 mL)  1,000 Units/hr Intravenous Continuous Pattricia Boss, MD 10 mL/hr at 02/18/16 1920 1,000 Units/hr at 02/18/16 1920  . heparin injection    PRN Leonie Man, MD   3,000 Units at 02/18/16 2027  . insulin regular (NOVOLIN R,HUMULIN R) 250 Units in sodium chloride 0.9 % 250 mL (1 Units/mL) infusion   Intravenous To OR Rexene Alberts, MD      .  iopamidol (ISOVUE-370) 76 % injection    PRN Leonie Man, MD   55 mL at 02/18/16 2034  . magnesium sulfate (IV Push/IM) injection 40 mEq  40 mEq Other To OR Rexene Alberts, MD      . midazolam (VERSED) injection    PRN Leonie Man, MD   1 mg at 02/18/16 1942  . nitroGLYCERIN 50 mg in dextrose 5 % 250 mL (0.2 mg/mL) infusion  2-200 mcg/min Intravenous To OR Rexene Alberts, MD      . norepinephrine (LEVOPHED) 4 mg in dextrose 5 % 250 mL (0.016 mg/mL) infusion  0-40 mcg/min Intravenous To OR Rexene Alberts, MD      . phenylephrine (NEO-SYNEPHRINE) 20 mg in dextrose 5 % 250 mL (0.08 mg/mL) infusion  30-200 mcg/min Intravenous To OR Rexene Alberts, MD      . potassium chloride injection 80 mEq  80 mEq Other To OR Rexene Alberts, MD      . Radial Cocktail/Verapamil only    PRN Leonie Man, MD      . vancomycin (VANCOCIN) 1,000 mg in sodium chloride 0.9 % 1,000 mL irrigation   Irrigation To OR Rexene Alberts, MD      . vancomycin (VANCOCIN) 1,250 mg in sodium chloride 0.9 % 250 mL IVPB  1,250 mg Intravenous To OR Rexene Alberts, MD        No Known Allergies    Review of Systems:   General:  normal appetite, decreased energy, no weight gain, no weight loss, no fever  Cardiac:  + chest pain with exertion, + chest pain at rest, + SOB with exertion, + resting SOB, no PND, no orthopnea, no palpitations, no arrhythmia, no atrial fibrillation, no LE edema, no dizzy spells, no syncope  Respiratory:  + shortness of breath, no home oxygen, no productive cough, no dry cough, no bronchitis, no wheezing, no hemoptysis, no asthma, no pain with inspiration or cough, no sleep apnea, no CPAP at night  GI:   no difficulty swallowing, no reflux, no frequent heartburn, no hiatal  hernia, no abdominal pain, no constipation, no diarrhea, no hematochezia, no hematemesis, no melena  GU:   no dysuria,  no frequency, no urinary tract infection, no hematuria, no kidney stones, no kidney disease  Vascular:  no  pain suggestive of claudication, no pain in feet, no leg cramps, no varicose veins, no DVT, no non-healing foot ulcer  Neuro:   no stroke, no TIA's, no seizures, no headaches, no temporary blindness one eye,  no slurred speech, no peripheral neuropathy, no chronic pain, no instability of gait, no memory/cognitive dysfunction  Musculoskeletal: no arthritis, no joint swelling, no myalgias, no difficulty walking, normal mobility   Skin:   no rash, no itching, no skin infections, no pressure sores or ulcerations  Psych:   no anxiety, no depression, no nervousness, no unusual recent stress  Eyes:   no blurry vision, no floaters, no recent vision changes, + wears glasses or contacts  ENT:   no hearing loss, no loose or painful teeth, no dentures  Hematologic:  no easy bruising, no abnormal bleeding, no clotting disorder, no frequent epistaxis  Endocrine:  + borderline diabetes, does not check CBG's at home     Physical Exam:   BP 117/80 mmHg  Pulse 75  Temp(Src) 97.3 F (36.3 C) (Oral)  Resp 19  Ht 5' 6"  (1.676 m)  Wt 164 lb (74.39 kg)  BMI 26.48 kg/m2  SpO2 98%  General:  Acutely ill-appearing, well nourished  HEENT:  Unremarkable   Neck:   no JVD, no bruits, no adenopathy   Chest:   clear to auscultation, symmetrical breath sounds, no wheezes, no rhonchi   CV:   RRR, no murmur   Abdomen:  soft, non-tender, no masses   Extremities:  warm, well-perfused, pulses diminished, no lower extremity edema  Rectal/GU  Deferred  Neuro:   Grossly non-focal and symmetrical throughout  Skin:   Clean and dry, no rashes, no breakdown  Diagnostic Tests:  CARDIAC CATHETERIZATION Procedures    Left Heart Cath and Coronary Angiography    Conclusion     Ost LM to LM lesion, 60% stenosed.  Ost LAD-1 lesion, 99% stenosed. Ost LAD-2 lesion, 70% stenosed. Mid LAD lesion, 40% stenosed. Dist LAD lesion, 40% stenosed.  Prox Cx lesion, 95% stenosed - prior to OM1. Mid Cx lesion, 70% stenosed just prior  to OM 2 takeoff  Ost RCA to Dist RCA lesion, 100% stenosed. PDA and posterolateral branch are filled via left-to-right collaterals  There is severe left ventricular systolic dysfunction. Elevated LVEDP   Patient has severe multivessel disease with thrombotic stenosis of the ostial LAD with proximal stenosis distal to the thrombus. There is also apparent thrombus in the mid LAD as well as proximal circumflex with 95% stenosis of the early proximal circumflex. Additional thrombus is noted in the bifurcation of AV groove circumflex and OM 2. The RCA appears to be chronically occluded with left-to-right collaterals filling the PDA and posterior lateral branches.  Appears to be severe ischemic cardiomyopathy with acute combined systolic and diastolic heart failure.  After reviewing the angiographic images with severe LV dysfunction, it was clear the course of action was to contact CT surgery. Dr. Roxy Manns arrived rapidly to the Cath Lab and agreed to go patient to the OR emergently. As she was having ongoing 6/10 anginal pain, an intra-aortic balloon pump was placed.  The patient was taken directly from the Cath Lab to the operating room for emergent CABG.   Radial sheath will be removed with TR  band to be applied in the surgical ICU following the operation.    Leonie Man, M.D., M.S. Interventional Cardiologist   Pager # [phone] Phone # [phone] [address] [place], [place] [address]      Indications    Acute ST elevation myocardial infarction (STEMI) of lateral wall (HCC) [I21.29 (ICD-10-CM)]    Technique and Indications    Primary Physician: Donnajean Lopes, MD Primary Cardiologist: Dr. Percival Spanish   The patient is a pleasant 70 year old Caucasian female with past medical history of right renal artery stent 2005, hyperlipidemia, history of CAD with remote history of VF arrest in 1990s treated with PTCA. In 1996, she had a cardiac catheterization that  demonstrated mild stenosis of the mid LAD, 50-60% circumflex lesion and a long, diffuse 50% stenosis of the RCA with mid 90% stenosis. She was seen in the cardiology clinic yesterday on 02/17/2016 as she has been having intermittent chest discomfort since this past Saturday on 3/25. She had recurrent chest discomfort on Monday and Tuesday night. She described as that episode as substernal chest discomfort lasting roughly 20 minutes. She tried to take nitroglycerin at home which did help after the second tablet. The pain was substernal and radiated to bilateral arms and into her jaw. She was originally scheduled to undergo cardiac catheterization on 3/31.  She had recurrent chest pain around 5 PM on 02/18/2016, however this time it did not go away, she decided to seek medical attention at [facility]. On EMS arrival, she was hypotensive. As EMS took her out of the house, she also vomited. She took aspirin prior to EMS arrival, she later received 4 sublingual nitroglycerin which did not take away the chest pain. On arrival to [facility] ED, she still complained of 7 out of 10 chest pain. Her hypotension is better after IV hydration.   EKG in the Emergency Room at 1847 hrs revealed roughly 2 mm ST elevations in leads I and aVL with reciprocal change in in leads II, III. Code STEMI was called. Dr. Marlou Porch & Mr. Eulas Post, [place] evaluated the person in the emergency room. He agreed that this was a true STEMI. The on-call STEMI physician was in process of a multivessel PCI, therefore I was called for backup assistance. She arrived to the Cath Lab at 1932 hrs.  She was given 4000 units of IV heparin and 324 mg of aspirin in the emergency room.  Procedure:  Estimated blood loss <50 mL. There were no immediate complications during the procedure.  Time Out: Verified patient identification, verified procedure, site/side was marked, verified correct patient position, special equipment/implants available,  medications/allergies/relevent history reviewed, required imaging and test results available. Performed. Consent Signed.  During this procedure the patient is administered a total of Versed 1 mg and to achieve and maintain moderate conscious sedation. The patient's heart rate, blood pressure, and oxygen saturation are monitored continuously during the procedure. The period of conscious sedation is 46 minutes, of which I was present face-to-face 100% of this time.  Access:  RIGHT Radial Artery: 6 Fr sheath -- Seldinger technique using Angiocath Micropuncture Kit * 5 mL radial cocktail IA; additional 2000 Units IV Heparin -- After initial angiography, the decision was made to place an intra-aortic balloon pump. RIGHT Common Femoral Artery: 6 Fr Sheath - fluoroscopically guided modified Seldinger Technique   Left Heart Catheterization: 5 Fr Catheters advanced or exchanged over a J-wire under direct fluoroscopic guidance into the ascending aorta; TIG 4.0 catheter advanced first.  Left & Right Coronary Artery Cineangiography: TIG 4.0 Catheter  LV Hemodynamics (LV Gram): Angled Pigtail Catheter (after the Aortic Valve was crossed using the TIG Catheter)  After the decision was made to proceed with emergent CABG, I discussed the patient's presentation with Dr. Roxy Manns and we both agreed that she would benefit from Intra-Aortic Balloon Pump (IABP) placement  The 6 French sheath was exchanged for the 8 French IABP sheath. The balloon catheter was then advanced under for scopic guidance over the the provided wire into the proximal portion of the descending aorta. Location was confirmed fluoroscopically. The lumen was aspirated and flushed, the shoulder lines were attached, and pump was placed on 1:1 augmentation.  The IABP sheath was sutured in place, and stat locks were used to secure the IABP catheter.  Radial Sheath was sutured in place and connected to arterial pressure line. It will be removed in the  surgical ICU post CABG with TR band placed for hemostasis.   MEDICATIONS * 1 mgIV Versed * SQ Lidocaine 58m * Radial Cocktail: 5 mg in 10 mL NS - 1/2/5 mL administered * Isovue Contrast: 55 * Heparin: Additional 2000+3000 Units    Coronary Findings    Dominance: Right   Left Main   . Ost LM to LM lesion, 60% stenosed. Mildly Calcified tubular eccentric.     Left Anterior Descending  . Vessel is moderate in size.   .Colon FlatteryLAD-1 lesion, 99% stenosed. Discrete with heavy thrombus ulcerative.   .Colon FlatteryLAD-2 lesion, 70% stenosed. Tubular eccentric.   . Mid LAD lesion, 40% stenosed. Thrombotic. hazy   . Dist LAD lesion, 40% stenosed. Tubular eccentric.   . First Diagonal Branch   The vessel is small in size.   . First Septal Branch   The vessel is small in size.   .Marland KitchenSecond Diagonal Branch   The vessel is moderate in size.   .Marland KitchenSecond Septal Branch   The vessel is small in size.     Left Circumflex   . Prox Cx lesion, 95% stenosed. The lesion is type C Diffuse with heavy thrombus eccentric ulcerative located proximal to major branch.   . Mid Cx lesion, 70% stenosed. Discrete thrombotic located at the major branch.   . Lateral First Obtuse Marginal Branch   The vessel is small in size.   .Marland KitchenSecond Obtuse Marginal Branch   The vessel is small in size.     Right Coronary Artery  . Vessel is moderate in size.   .Colon FlatteryRCA to Dist RCA lesion, 100% stenosed. Calcified diffuse chronic total occlusion located proximal to major branch.   . Right Posterior Descending Artery   The vessel is moderate in size. RPDA filled by collaterals from Dist LAD.   .Marland KitchenInferior Septal   The vessel is small in size. Inf Sept filled by collaterals from 1st Sept.   . Right Posterior Atrioventricular Branch   The vessel is small in size. There is mild and diffuse disease in the vessel.   . First Right Posterolateral   There is mild disease in the vessel. 1st RPLB filled by collaterals from Lat 2nd Mrg.    . Second Right Posterolateral   There is moderate disease in the vessel.   . Third Right Posterolateral   There is mild disease in the vessel. 3rd RPLB filled by collaterals from Dist Cx.      Wall Motion  Left Heart    Left Ventricle The left ventricle is enlarged. There is severe left ventricular systolic dysfunction. The left ventricular ejection fraction is 25-35% by visual estimate. The ejection fraction could not be assessed due to ectopy. There are wall motion abnormalities in the left ventricle. There are segmental wall motion abnormalities in the left ventricle and there is global hypokinesis of the left ventricle.   Mitral Valve There is no mitral valve stenosis and mild (2+) mitral regurgitation.   Aortic Valve There is no aortic valve stenosis, and no aortic valve regurgitation.    Coronary Diagrams    Diagnostic Diagram            Implants     No implant documentation for this case.    PACS Images    Show images for Cardiac catheterization     Link to Procedure Log    Procedure Log      Hemo Data       Most Recent Value   AO Systolic Pressure  767 mmHg   AO Diastolic Pressure  39 mmHg   AO Mean  87 mmHg   LV Systolic Pressure  96 mmHg   LV Diastolic Pressure  19 mmHg   LV EDP  23 mmHg   Left Ventricular Apex Extended Systolic Pressure  341 mmHg   Left Ventricular Apex Extended Diastolic Pressure  19 mmHg   Left Ventricular Apex Extended EDP Pressure  23 mmHg       Impression:  Left main disease with critical three-vessel coronary artery disease including subtotal ostial occlusion of the left anterior descending coronary artery with acute thrombus, clot within the left circumflex coronary artery, and acute evolving anterior and lateral wall myocardial infarction complicated by cardiogenic shock. The patient's condition has improved somewhat following placement of intra-aortic balloon pump. I agree that she  would best be treated with emergent surgical revascularization.  Plan:  I discussed the indications, risks, and potential benefits of emergency coronary artery bypass grafting with the patient in the cath lab and with her husband and family in the waiting area. Alternative treatment strategies of the discussed. In particular, concerns regarding risks associated with PCI and stenting have been expressed. The understand the emergent circumstances and the need to proceed with surgery immediately. They understand and accept all potential associated risks of surgery including but not limited to risk of death, stroke or other neurologic complication, myocardial infarction, congestive heart failure, respiratory failure, renal failure, bleeding requiring blood transfusion and/or reexploration, aortic dissection or other major vascular complication, arrhythmia, heart block or bradycardia requiring permanent pacemaker, pneumonia, pleural effusion, wound infection, pulmonary embolus or other thromboembolic complication, chronic pain or other delayed complications related to median sternotomy, or the late recurrence of symptomatic ischemic heart disease and/or congestive heart failure.  The importance of long term risk modification have been emphasized.  All questions answered.   I spent in excess of 90 minutes during the conduct of this hospital consultation and >50% of this time involved direct face-to-face encounter for counseling and/or coordination of the patient's care.    Valentina Gu. Roxy Manns, MD 02/18/2016 8:39 PM

## 2016-02-18 NOTE — ED Notes (Signed)
[facility] contacted to page Code Stemi

## 2016-02-18 NOTE — ED Notes (Signed)
Pro pads attached.

## 2016-02-19 ENCOUNTER — Other Ambulatory Visit: Payer: Self-pay

## 2016-02-19 ENCOUNTER — Inpatient Hospital Stay (HOSPITAL_COMMUNITY): Payer: Medicare Other

## 2016-02-19 ENCOUNTER — Encounter (HOSPITAL_COMMUNITY): Payer: Self-pay | Admitting: Thoracic Surgery (Cardiothoracic Vascular Surgery)

## 2016-02-19 ENCOUNTER — Ambulatory Visit (HOSPITAL_COMMUNITY): Admission: RE | Admit: 2016-02-19 | Payer: Medicare Other | Source: Ambulatory Visit | Admitting: Cardiology

## 2016-02-19 ENCOUNTER — Encounter (HOSPITAL_COMMUNITY)
Admission: EM | Disposition: A | Discharge status: Home / Self Care | Payer: Self-pay | Source: Home / Self Care | Attending: Thoracic Surgery (Cardiothoracic Vascular Surgery)

## 2016-02-19 DIAGNOSIS — J90 Pleural effusion, not elsewhere classified: Secondary | ICD-10-CM | POA: Diagnosis not present

## 2016-02-19 DIAGNOSIS — I110 Hypertensive heart disease with heart failure: Secondary | ICD-10-CM | POA: Diagnosis not present

## 2016-02-19 DIAGNOSIS — I2510 Atherosclerotic heart disease of native coronary artery without angina pectoris: Secondary | ICD-10-CM

## 2016-02-19 DIAGNOSIS — Z87891 Personal history of nicotine dependence: Secondary | ICD-10-CM | POA: Diagnosis not present

## 2016-02-19 DIAGNOSIS — J9811 Atelectasis: Secondary | ICD-10-CM | POA: Diagnosis not present

## 2016-02-19 DIAGNOSIS — Z951 Presence of aortocoronary bypass graft: Secondary | ICD-10-CM | POA: Diagnosis not present

## 2016-02-19 DIAGNOSIS — R079 Chest pain, unspecified: Secondary | ICD-10-CM | POA: Diagnosis not present

## 2016-02-19 DIAGNOSIS — D62 Acute posthemorrhagic anemia: Secondary | ICD-10-CM | POA: Diagnosis not present

## 2016-02-19 DIAGNOSIS — E785 Hyperlipidemia, unspecified: Secondary | ICD-10-CM | POA: Diagnosis present

## 2016-02-19 DIAGNOSIS — I2129 ST elevation (STEMI) myocardial infarction involving other sites: Secondary | ICD-10-CM | POA: Diagnosis present

## 2016-02-19 DIAGNOSIS — I25110 Atherosclerotic heart disease of native coronary artery with unstable angina pectoris: Secondary | ICD-10-CM | POA: Diagnosis present

## 2016-02-19 DIAGNOSIS — I5021 Acute systolic (congestive) heart failure: Secondary | ICD-10-CM | POA: Diagnosis not present

## 2016-02-19 DIAGNOSIS — I2102 ST elevation (STEMI) myocardial infarction involving left anterior descending coronary artery: Secondary | ICD-10-CM | POA: Diagnosis not present

## 2016-02-19 DIAGNOSIS — D696 Thrombocytopenia, unspecified: Secondary | ICD-10-CM | POA: Diagnosis not present

## 2016-02-19 DIAGNOSIS — R570 Cardiogenic shock: Secondary | ICD-10-CM | POA: Diagnosis not present

## 2016-02-19 DIAGNOSIS — Z7982 Long term (current) use of aspirin: Secondary | ICD-10-CM | POA: Diagnosis not present

## 2016-02-19 DIAGNOSIS — R001 Bradycardia, unspecified: Secondary | ICD-10-CM | POA: Diagnosis present

## 2016-02-19 DIAGNOSIS — Z9861 Coronary angioplasty status: Secondary | ICD-10-CM | POA: Diagnosis not present

## 2016-02-19 DIAGNOSIS — Z8249 Family history of ischemic heart disease and other diseases of the circulatory system: Secondary | ICD-10-CM | POA: Diagnosis not present

## 2016-02-19 DIAGNOSIS — E119 Type 2 diabetes mellitus without complications: Secondary | ICD-10-CM | POA: Diagnosis present

## 2016-02-19 DIAGNOSIS — Z7984 Long term (current) use of oral hypoglycemic drugs: Secondary | ICD-10-CM | POA: Diagnosis not present

## 2016-02-19 DIAGNOSIS — I252 Old myocardial infarction: Secondary | ICD-10-CM | POA: Diagnosis not present

## 2016-02-19 DIAGNOSIS — I2109 ST elevation (STEMI) myocardial infarction involving other coronary artery of anterior wall: Secondary | ICD-10-CM | POA: Diagnosis not present

## 2016-02-19 DIAGNOSIS — Z95828 Presence of other vascular implants and grafts: Secondary | ICD-10-CM | POA: Diagnosis not present

## 2016-02-19 DIAGNOSIS — I255 Ischemic cardiomyopathy: Secondary | ICD-10-CM | POA: Diagnosis present

## 2016-02-19 LAB — POCT I-STAT 3, ART BLOOD GAS (G3+)
ACID-BASE DEFICIT: 7 mmol/L — AB (ref 0.0–2.0)
ACID-BASE DEFICIT: 1 mmol/L (ref 0.0–2.0)
ACID-BASE DEFICIT: 3 mmol/L — AB (ref 0.0–2.0)
ACID-BASE DEFICIT: 3 mmol/L — AB (ref 0.0–2.0)
Acid-base deficit: 3 mmol/L — ABNORMAL HIGH (ref 0.0–2.0)
Acid-base deficit: 5 mmol/L — ABNORMAL HIGH (ref 0.0–2.0)
BICARBONATE: 19.8 meq/L — AB (ref 20.0–24.0)
Bicarbonate: 21.7 mEq/L (ref 20.0–24.0)
Bicarbonate: 20.1 mEq/L (ref 20.0–24.0)
Bicarbonate: 24 mEq/L (ref 20.0–24.0)
Bicarbonate: 23.1 mEq/L (ref 20.0–24.0)
Bicarbonate: 22.6 mEq/L (ref 20.0–24.0)
O2 SAT: 97 %
O2 SAT: 96 %
O2 SAT: 93 %
O2 Saturation: 93 %
O2 Saturation: 95 %
O2 Saturation: 94 %
PCO2 ART: 33.9 mmHg — AB (ref 35.0–45.0)
PCO2 ART: 37.3 mmHg (ref 35.0–45.0)
PCO2 ART: 43.5 mmHg (ref 35.0–45.0)
PCO2 ART: 38.5 mmHg (ref 35.0–45.0)
PCO2 ART: 44 mmHg (ref 35.0–45.0)
PH ART: 7.405 (ref 7.350–7.450)
PH ART: 7.34 — AB (ref 7.350–7.450)
PH ART: 7.266 — AB (ref 7.350–7.450)
PH ART: 7.405 (ref 7.350–7.450)
PH ART: 7.331 — AB (ref 7.350–7.450)
PH ART: 7.33 — AB (ref 7.350–7.450)
PO2 ART: 83 mmHg (ref 80.0–100.0)
PO2 ART: 92 mmHg (ref 80.0–100.0)
PO2 ART: 92 mmHg (ref 80.0–100.0)
PO2 ART: 73 mmHg — AB (ref 80.0–100.0)
Patient temperature: 37.1
Patient temperature: 37.3
Patient temperature: 37.4
Patient temperature: 37.3
TCO2: 23 mmol/L (ref 0–100)
TCO2: 21 mmol/L (ref 0–100)
TCO2: 21 mmol/L (ref 0–100)
TCO2: 25 mmol/L (ref 0–100)
TCO2: 24 mmol/L (ref 0–100)
TCO2: 24 mmol/L (ref 0–100)
pCO2 arterial: 42.9 mmHg (ref 35.0–45.0)
pO2, Arterial: 58 mmHg — ABNORMAL LOW (ref 80.0–100.0)
pO2, Arterial: 80 mmHg (ref 80.0–100.0)

## 2016-02-19 LAB — GLUCOSE, CAPILLARY
GLUCOSE-CAPILLARY: 95 mg/dL (ref 65–99)
GLUCOSE-CAPILLARY: 135 mg/dL — AB (ref 65–99)
GLUCOSE-CAPILLARY: 164 mg/dL — AB (ref 65–99)
GLUCOSE-CAPILLARY: 154 mg/dL — AB (ref 65–99)
Glucose-Capillary: 93 mg/dL (ref 65–99)
Glucose-Capillary: 101 mg/dL — ABNORMAL HIGH (ref 65–99)
Glucose-Capillary: 185 mg/dL — ABNORMAL HIGH (ref 65–99)

## 2016-02-19 LAB — CBC
HEMATOCRIT: 29.2 % — AB (ref 36.0–46.0)
HEMATOCRIT: 29 % — AB (ref 36.0–46.0)
HEMATOCRIT: 30.6 % — AB (ref 36.0–46.0)
HEMOGLOBIN: 9.8 g/dL — AB (ref 12.0–15.0)
HEMOGLOBIN: 9.8 g/dL — AB (ref 12.0–15.0)
Hemoglobin: 9.8 g/dL — ABNORMAL LOW (ref 12.0–15.0)
MCH: 30.2 pg (ref 26.0–34.0)
MCH: 30.2 pg (ref 26.0–34.0)
MCH: 29.4 pg (ref 26.0–34.0)
MCHC: 33.6 g/dL (ref 30.0–36.0)
MCHC: 33.8 g/dL (ref 30.0–36.0)
MCHC: 32 g/dL (ref 30.0–36.0)
MCV: 89.8 fL (ref 78.0–100.0)
MCV: 89.5 fL (ref 78.0–100.0)
MCV: 91.9 fL (ref 78.0–100.0)
PLATELETS: 121 10*3/uL — AB (ref 150–400)
Platelets: 121 10*3/uL — ABNORMAL LOW (ref 150–400)
Platelets: 142 10*3/uL — ABNORMAL LOW (ref 150–400)
RBC: 3.25 MIL/uL — ABNORMAL LOW (ref 3.87–5.11)
RBC: 3.24 MIL/uL — ABNORMAL LOW (ref 3.87–5.11)
RBC: 3.33 MIL/uL — ABNORMAL LOW (ref 3.87–5.11)
RDW: 13.8 % (ref 11.5–15.5)
RDW: 13.9 % (ref 11.5–15.5)
RDW: 14.4 % (ref 11.5–15.5)
WBC: 9 10*3/uL (ref 4.0–10.5)
WBC: 8.6 10*3/uL (ref 4.0–10.5)
WBC: 10.8 10*3/uL — ABNORMAL HIGH (ref 4.0–10.5)

## 2016-02-19 LAB — BASIC METABOLIC PANEL
Anion gap: 5 (ref 5–15)
BUN: 18 mg/dL (ref 6–20)
CALCIUM: 7.4 mg/dL — AB (ref 8.9–10.3)
CHLORIDE: 115 mmol/L — AB (ref 101–111)
CO2: 20 mmol/L — AB (ref 22–32)
CREATININE: 0.79 mg/dL (ref 0.44–1.00)
GFR calc Af Amer: >60 mL/min (ref >60)
GFR calc non Af Amer: >60 mL/min (ref >60)
GLUCOSE: 123 mg/dL — AB (ref 65–99)
Potassium: 4.5 mmol/L (ref 3.5–5.1)
Sodium: 140 mmol/L (ref 135–145)

## 2016-02-19 LAB — POCT I-STAT, CHEM 8
BUN: 27 mg/dL — AB (ref 6–20)
BUN: 19 mg/dL (ref 6–20)
BUN: 18 mg/dL (ref 6–20)
CHLORIDE: 107 mmol/L (ref 101–111)
CHLORIDE: 109 mmol/L (ref 101–111)
CREATININE: 0.8 mg/dL (ref 0.44–1.00)
CREATININE: 0.7 mg/dL (ref 0.44–1.00)
Calcium, Ion: 1.18 mmol/L (ref 1.13–1.30)
Calcium, Ion: 1.11 mmol/L — ABNORMAL LOW (ref 1.13–1.30)
Calcium, Ion: 1.22 mmol/L (ref 1.13–1.30)
Chloride: 111 mmol/L (ref 101–111)
Creatinine, Ser: 0.6 mg/dL (ref 0.44–1.00)
GLUCOSE: 149 mg/dL — AB (ref 65–99)
Glucose, Bld: 171 mg/dL — ABNORMAL HIGH (ref 65–99)
Glucose, Bld: 119 mg/dL — ABNORMAL HIGH (ref 65–99)
HCT: 29 % — ABNORMAL LOW (ref 36.0–46.0)
HEMATOCRIT: 38 % (ref 36.0–46.0)
HEMATOCRIT: 26 % — AB (ref 36.0–46.0)
HEMOGLOBIN: 8.8 g/dL — AB (ref 12.0–15.0)
HEMOGLOBIN: 9.9 g/dL — AB (ref 12.0–15.0)
Hemoglobin: 12.9 g/dL (ref 12.0–15.0)
POTASSIUM: 3.4 mmol/L — AB (ref 3.5–5.1)
POTASSIUM: 3.5 mmol/L (ref 3.5–5.1)
Potassium: 3.8 mmol/L (ref 3.5–5.1)
SODIUM: 141 mmol/L (ref 135–145)
Sodium: 141 mmol/L (ref 135–145)
Sodium: 141 mmol/L (ref 135–145)
TCO2: 21 mmol/L (ref 0–100)
TCO2: 22 mmol/L (ref 0–100)
TCO2: 23 mmol/L (ref 0–100)

## 2016-02-19 LAB — CREATININE, SERUM
CREATININE: 0.76 mg/dL (ref 0.44–1.00)
GFR calc Af Amer: >60 mL/min (ref >60)
GFR calc non Af Amer: >60 mL/min (ref >60)

## 2016-02-19 LAB — MAGNESIUM
Magnesium: 3 mg/dL — ABNORMAL HIGH (ref 1.7–2.4)
Magnesium: 2.5 mg/dL — ABNORMAL HIGH (ref 1.7–2.4)

## 2016-02-19 LAB — POCT ACTIVATED CLOTTING TIME
ACTIVATED CLOTTING TIME: 136 s
Activated Clotting Time: 162 seconds

## 2016-02-19 LAB — MRSA PCR SCREENING: MRSA BY PCR: NEGATIVE

## 2016-02-19 LAB — PROTIME-INR
INR: 1.35 (ref 0.00–1.49)
Prothrombin Time: 16.8 seconds — ABNORMAL HIGH (ref 11.6–15.2)

## 2016-02-19 LAB — APTT: APTT: 30 s (ref 24–37)

## 2016-02-19 SURGERY — LEFT HEART CATH AND CORONARY ANGIOGRAPHY

## 2016-02-19 MED ORDER — ASPIRIN 81 MG PO CHEW: 324.0000 mg | CHEWABLE_TABLET | Freq: Every day | ORAL | Status: DC

## 2016-02-19 MED ORDER — LACTATED RINGERS IV SOLN: INTRAVENOUS | Status: DC

## 2016-02-19 MED ORDER — MIDAZOLAM HCL 2 MG/2ML IJ SOLN
2.0000 mg | INTRAMUSCULAR | Status: DC | PRN
  Filled 2016-02-19: qty 2

## 2016-02-19 MED ORDER — LACTATED RINGERS IV SOLN: 500.0000 mL | Freq: Once | INTRAVENOUS | Status: DC | PRN

## 2016-02-19 MED ORDER — POTASSIUM CHLORIDE 10 MEQ/50ML IV SOLN
10.0000 meq | INTRAVENOUS | Status: AC
  Administered 2016-02-19 (×5): 10 meq via INTRAVENOUS

## 2016-02-19 MED ORDER — BISACODYL 10 MG RE SUPP: 10.0000 mg | Freq: Every day | RECTAL | Status: DC

## 2016-02-19 MED ORDER — MILRINONE IN DEXTROSE 20 MG/100ML IV SOLN
0.2000 ug/kg/min | INTRAVENOUS | Status: DC
  Administered 2016-02-19 (×2): 0.2 ug/kg/min via INTRAVENOUS
  Filled 2016-02-19: qty 100

## 2016-02-19 MED ORDER — NITROGLYCERIN 0.4 MG SL SUBL: 0.4000 mg | SUBLINGUAL_TABLET | SUBLINGUAL | Status: DC | PRN

## 2016-02-19 MED ORDER — DEXMEDETOMIDINE HCL IN NACL 200 MCG/50ML IV SOLN
0.0000 ug/kg/h | INTRAVENOUS | Status: DC
  Administered 2016-02-19: 0.1 ug/kg/h via INTRAVENOUS
  Filled 2016-02-19: qty 50

## 2016-02-19 MED ORDER — DOCUSATE SODIUM 100 MG PO CAPS
200.0000 mg | ORAL_CAPSULE | Freq: Every day | ORAL | Status: DC
  Administered 2016-02-20 – 2016-02-23 (×2): 200 mg via ORAL
  Filled 2016-02-19 (×2): qty 2

## 2016-02-19 MED ORDER — CHLORHEXIDINE GLUCONATE 0.12 % MT SOLN
15.0000 mL | OROMUCOSAL | Status: AC
  Administered 2016-02-19: 15 mL via OROMUCOSAL

## 2016-02-19 MED ORDER — MORPHINE SULFATE (PF) 2 MG/ML IV SOLN
1.0000 mg | INTRAVENOUS | Status: DC | PRN
  Filled 2016-02-19: qty 2

## 2016-02-19 MED ORDER — ASPIRIN 81 MG PO TABS: 81.0000 mg | ORAL_TABLET | Freq: Every day | ORAL | Status: DC

## 2016-02-19 MED ORDER — ONE-DAILY MULTI VITAMINS PO TABS: 1.0000 | ORAL_TABLET | Freq: Every day | ORAL | Status: DC

## 2016-02-19 MED ORDER — METOPROLOL TARTRATE 1 MG/ML IV SOLN
2.5000 mg | INTRAVENOUS | Status: DC | PRN
Start: 2016-02-19 — End: 2016-02-21

## 2016-02-19 MED ORDER — INSULIN REGULAR BOLUS VIA INFUSION
0.0000 [IU] | Freq: Three times a day (TID) | INTRAVENOUS | Status: DC
  Filled 2016-02-19: qty 10

## 2016-02-19 MED ORDER — MAGNESIUM SULFATE 4 GM/100ML IV SOLN
4.0000 g | Freq: Once | INTRAVENOUS | Status: AC
  Administered 2016-02-19: 4 g via INTRAVENOUS
  Filled 2016-02-19: qty 100

## 2016-02-19 MED ORDER — SODIUM CHLORIDE 0.9% FLUSH: 3.0000 mL | INTRAVENOUS | Status: DC | PRN

## 2016-02-19 MED ORDER — MORPHINE SULFATE (PF) 2 MG/ML IV SOLN
1.0000 mg | INTRAVENOUS | Status: DC | PRN
  Administered 2016-02-19 – 2016-02-20 (×3): 2 mg via INTRAVENOUS
  Filled 2016-02-19 (×3): qty 1

## 2016-02-19 MED ORDER — OXYCODONE HCL 5 MG PO TABS
5.0000 mg | ORAL_TABLET | ORAL | Status: DC | PRN
  Administered 2016-02-19 – 2016-02-20 (×4): 10 mg via ORAL
  Filled 2016-02-19 (×4): qty 2

## 2016-02-19 MED ORDER — INSULIN ASPART 100 UNIT/ML ~~LOC~~ SOLN
0.0000 [IU] | SUBCUTANEOUS | Status: DC
  Administered 2016-02-19: 2 [IU] via SUBCUTANEOUS
  Administered 2016-02-19 (×2): 4 [IU] via SUBCUTANEOUS
  Administered 2016-02-19 – 2016-02-20 (×7): 2 [IU] via SUBCUTANEOUS

## 2016-02-19 MED ORDER — SODIUM CHLORIDE 0.9 % IV SOLN
INTRAVENOUS | Status: DC
  Administered 2016-02-19: 01:00:00 via INTRAVENOUS

## 2016-02-19 MED ORDER — POTASSIUM CHLORIDE 10 MEQ/50ML IV SOLN
10.0000 meq | INTRAVENOUS | Status: AC
  Administered 2016-02-19 (×3): 10 meq via INTRAVENOUS
  Filled 2016-02-19 (×2): qty 50

## 2016-02-19 MED ORDER — TRAMADOL HCL 50 MG PO TABS
50.0000 mg | ORAL_TABLET | ORAL | Status: DC | PRN
  Administered 2016-02-21: 100 mg via ORAL
  Filled 2016-02-19: qty 2

## 2016-02-19 MED ORDER — PANTOPRAZOLE SODIUM 40 MG PO TBEC
40.0000 mg | DELAYED_RELEASE_TABLET | Freq: Every day | ORAL | Status: DC
  Administered 2016-02-20 – 2016-02-23 (×4): 40 mg via ORAL
  Filled 2016-02-19 (×4): qty 1

## 2016-02-19 MED ORDER — ACETAMINOPHEN 650 MG RE SUPP: 650.0000 mg | Freq: Once | RECTAL | Status: AC

## 2016-02-19 MED ORDER — BISACODYL 5 MG PO TBEC
10.0000 mg | DELAYED_RELEASE_TABLET | Freq: Every day | ORAL | Status: DC
  Administered 2016-02-20 – 2016-02-23 (×2): 10 mg via ORAL
  Filled 2016-02-19 (×2): qty 2

## 2016-02-19 MED ORDER — CO-ENZYME Q10 200 MG PO CAPS: 1.0000 | ORAL_CAPSULE | Freq: Every day | ORAL | Status: DC

## 2016-02-19 MED ORDER — ACETAMINOPHEN 160 MG/5ML PO SOLN: 1000.0000 mg | Freq: Four times a day (QID) | ORAL | Status: DC

## 2016-02-19 MED ORDER — ACETAMINOPHEN 325 MG PO TABS: 650.0000 mg | ORAL_TABLET | ORAL | Status: DC | PRN

## 2016-02-19 MED ORDER — ACETAMINOPHEN 500 MG PO TABS
1000.0000 mg | ORAL_TABLET | Freq: Four times a day (QID) | ORAL | Status: DC
  Administered 2016-02-20 – 2016-02-23 (×11): 1000 mg via ORAL
  Filled 2016-02-19 (×13): qty 2

## 2016-02-19 MED ORDER — SODIUM BICARBONATE 8.4 % IV SOLN
100.0000 meq | Freq: Once | INTRAVENOUS | Status: AC
  Administered 2016-02-19: 100 meq via INTRAVENOUS

## 2016-02-19 MED ORDER — SODIUM CHLORIDE 0.9 % IV SOLN
INTRAVENOUS | Status: DC
  Administered 2016-02-19: 20:00:00 via INTRAVENOUS

## 2016-02-19 MED ORDER — ALBUMIN HUMAN 5 % IV SOLN
250.0000 mL | INTRAVENOUS | Status: DC | PRN
  Administered 2016-02-19 (×3): 250 mL via INTRAVENOUS
  Filled 2016-02-19: qty 250

## 2016-02-19 MED ORDER — SODIUM CHLORIDE 0.45 % IV SOLN
INTRAVENOUS | Status: DC | PRN
  Administered 2016-02-19 (×2): via INTRAVENOUS

## 2016-02-19 MED ORDER — SODIUM CHLORIDE 0.9 % IV SOLN: INTRAVENOUS | Status: DC

## 2016-02-19 MED ORDER — ONDANSETRON HCL 4 MG/2ML IJ SOLN
4.0000 mg | Freq: Four times a day (QID) | INTRAMUSCULAR | Status: DC | PRN
  Administered 2016-02-19 – 2016-02-21 (×4): 4 mg via INTRAVENOUS
  Filled 2016-02-19 (×6): qty 2

## 2016-02-19 MED ORDER — ONDANSETRON HCL 4 MG/2ML IJ SOLN: 4.0000 mg | Freq: Four times a day (QID) | INTRAMUSCULAR | Status: DC | PRN

## 2016-02-19 MED ORDER — ASPIRIN EC 325 MG PO TBEC
325.0000 mg | DELAYED_RELEASE_TABLET | Freq: Every day | ORAL | Status: DC
  Administered 2016-02-20 – 2016-02-21 (×2): 325 mg via ORAL
  Filled 2016-02-19 (×2): qty 1

## 2016-02-19 MED ORDER — ATORVASTATIN CALCIUM 80 MG PO TABS: 80.0000 mg | ORAL_TABLET | Freq: Every day | ORAL | Status: DC

## 2016-02-19 MED ORDER — MAGNESIUM 500 MG PO TABS: 1.0000 | ORAL_TABLET | Freq: Every day | ORAL | Status: DC

## 2016-02-19 MED ORDER — SODIUM CHLORIDE 0.9 % IV SOLN: 250.0000 mL | INTRAVENOUS | Status: DC

## 2016-02-19 MED ORDER — POTASSIUM CHLORIDE 10 MEQ/50ML IV SOLN: 10.0000 meq | INTRAVENOUS | Status: DC

## 2016-02-19 MED ORDER — ATORVASTATIN CALCIUM 80 MG PO TABS
80.0000 mg | ORAL_TABLET | Freq: Every day | ORAL | Status: DC
  Administered 2016-02-19 – 2016-02-20 (×2): 80 mg via ORAL
  Filled 2016-02-19 (×2): qty 1

## 2016-02-19 MED ORDER — ANTISEPTIC ORAL RINSE SOLUTION (CORINZ)
7.0000 mL | Freq: Four times a day (QID) | OROMUCOSAL | Status: DC
  Administered 2016-02-19 – 2016-02-20 (×3): 7 mL via OROMUCOSAL

## 2016-02-19 MED ORDER — FAMOTIDINE IN NACL 20-0.9 MG/50ML-% IV SOLN
20.0000 mg | Freq: Two times a day (BID) | INTRAVENOUS | Status: AC
  Administered 2016-02-19 (×2): 20 mg via INTRAVENOUS
  Filled 2016-02-19: qty 50

## 2016-02-19 MED ORDER — CALCIUM CARBONATE-VITAMIN D 500-200 MG-UNIT PO TABS: 1.0000 | ORAL_TABLET | Freq: Two times a day (BID) | ORAL | Status: DC

## 2016-02-19 MED ORDER — DEXTROSE 5 % IV SOLN
1.5000 g | Freq: Two times a day (BID) | INTRAVENOUS | Status: AC
  Administered 2016-02-19 – 2016-02-20 (×4): 1.5 g via INTRAVENOUS
  Filled 2016-02-19 (×4): qty 1.5

## 2016-02-19 MED ORDER — METOPROLOL TARTRATE 25 MG/10 ML ORAL SUSPENSION: 12.5000 mg | Freq: Two times a day (BID) | ORAL | Status: DC

## 2016-02-19 MED ORDER — LACTATED RINGERS IV SOLN
INTRAVENOUS | Status: DC
Start: 2016-02-19 — End: 2016-02-19

## 2016-02-19 MED ORDER — SODIUM CHLORIDE 0.9 % IV SOLN
INTRAVENOUS | Status: DC | PRN
  Administered 2016-02-18: via INTRAVENOUS

## 2016-02-19 MED ORDER — NITROGLYCERIN IN D5W 200-5 MCG/ML-% IV SOLN: 0.0000 ug/min | INTRAVENOUS | Status: DC

## 2016-02-19 MED ORDER — PHENYLEPHRINE HCL 10 MG/ML IJ SOLN
0.0000 ug/min | INTRAVENOUS | Status: DC
  Administered 2016-02-19: 10 ug/min via INTRAVENOUS
  Filled 2016-02-19 (×3): qty 2

## 2016-02-19 MED ORDER — POTASSIUM CHLORIDE 10 MEQ/50ML IV SOLN
10.0000 meq | INTRAVENOUS | Status: AC
  Administered 2016-02-19 (×2): 10 meq via INTRAVENOUS
  Filled 2016-02-19 (×2): qty 50

## 2016-02-19 MED ORDER — METOPROLOL TARTRATE 12.5 MG HALF TABLET
12.5000 mg | ORAL_TABLET | Freq: Two times a day (BID) | ORAL | Status: DC
  Administered 2016-02-20: 12.5 mg via ORAL
  Filled 2016-02-19: qty 1

## 2016-02-19 MED ORDER — MORPHINE SULFATE (PF) 2 MG/ML IV SOLN: 2.0000 mg | INTRAVENOUS | Status: DC | PRN

## 2016-02-19 MED ORDER — VANCOMYCIN HCL IN DEXTROSE 1-5 GM/200ML-% IV SOLN
1000.0000 mg | Freq: Once | INTRAVENOUS | Status: AC
  Administered 2016-02-19: 1000 mg via INTRAVENOUS
  Filled 2016-02-19: qty 200

## 2016-02-19 MED ORDER — CHLORHEXIDINE GLUCONATE 0.12% ORAL RINSE (MEDLINE KIT)
15.0000 mL | Freq: Two times a day (BID) | OROMUCOSAL | Status: DC
  Administered 2016-02-19 – 2016-02-20 (×3): 15 mL via OROMUCOSAL

## 2016-02-19 MED ORDER — ISOSORBIDE MONONITRATE ER 60 MG PO TB24: 60.0000 mg | ORAL_TABLET | Freq: Every day | ORAL | Status: DC

## 2016-02-19 MED ORDER — SODIUM CHLORIDE 0.9% FLUSH
3.0000 mL | Freq: Two times a day (BID) | INTRAVENOUS | Status: DC
  Administered 2016-02-20 – 2016-02-21 (×3): 3 mL via INTRAVENOUS

## 2016-02-19 MED ORDER — ACETAMINOPHEN 160 MG/5ML PO SOLN
650.0000 mg | Freq: Once | ORAL | Status: AC
  Administered 2016-02-19: 650 mg

## 2016-02-19 MED FILL — Sodium Chloride IV Soln 0.9%: INTRAVENOUS | Qty: 2000 | Status: AC

## 2016-02-19 MED FILL — Dexmedetomidine HCl in NaCl 0.9% IV Soln 400 MCG/100ML: INTRAVENOUS | Qty: 100 | Status: AC

## 2016-02-19 MED FILL — Heparin Sodium (Porcine) Inj 1000 Unit/ML: INTRAMUSCULAR | Qty: 10 | Status: AC

## 2016-02-19 MED FILL — Mannitol IV Soln 20%: INTRAVENOUS | Qty: 500 | Status: AC

## 2016-02-19 MED FILL — Heparin Sodium (Porcine) Inj 1000 Unit/ML: INTRAMUSCULAR | Qty: 30 | Status: AC

## 2016-02-19 MED FILL — Sodium Bicarbonate IV Soln 8.4%: INTRAVENOUS | Qty: 50 | Status: AC

## 2016-02-19 MED FILL — Lidocaine HCl IV Inj 20 MG/ML: INTRAVENOUS | Qty: 5 | Status: AC

## 2016-02-19 MED FILL — Dextrose Inj 5%: INTRAVENOUS | Qty: 250 | Status: AC

## 2016-02-19 MED FILL — Electrolyte-R (PH 7.4) Solution: INTRAVENOUS | Qty: 3000 | Status: AC

## 2016-02-19 MED FILL — Phenylephrine HCl Inj 10 MG/ML: INTRAMUSCULAR | Qty: 2 | Status: AC

## 2016-02-19 MED FILL — Magnesium Sulfate Inj 50%: INTRAMUSCULAR | Qty: 10 | Status: AC

## 2016-02-19 MED FILL — Potassium Chloride Inj 2 mEq/ML: INTRAVENOUS | Qty: 40 | Status: AC

## 2016-02-19 NOTE — Progress Notes (Signed)
Order for IABP removal and radial artery sheath removal confirmed.  ACT:136.  Right femoral site level 0, clean, and dry prior to removal.  IABP removed from right femoral artery, manual pressure held for 40 minutes.  No complications, pt tolerated well.  Groin site level 0.  Right DP present by doppler pre and post removal. Dressed with a 4x4 and tegaderm.  Verbal instructions given to pt.  Right radial site level 0, clean, dry prior to removal.  Sheath removed, TR band placed with 14cc of air.  Post removal site is a level 0, clean and dry.  Vitals remained WNL. HR: 90, SPO2: 97%, BP: 90/55 RR: 15. Verbal report given to RN.

## 2016-02-19 NOTE — Progress Notes (Addendum)
[address]       ,[place] [address]             [phone]        CARDIOTHORACIC SURGERY PROGRESS NOTE   R1 Day Post-Op Procedure(s) (LRB): CORONARY ARTERY BYPASS GRAFTING (CABG) times 2 using left internal mammary and right greater saphenous vein (N/A)  Subjective: Waking up on vent but still drowsy.  Looks comfortable.  Objective: Vital signs: BP Readings from Last 1 Encounters:  02/19/16 103/66   Pulse Readings from Last 1 Encounters:  02/19/16 86   Resp Readings from Last 1 Encounters:  02/19/16 17   Temp Readings from Last 1 Encounters:  02/19/16 98.8 F (37.1 C)     Hemodynamics: PAP: (20-37)/(12-23) 32/16 mmHg CO:  [2.7 L/min-3.9 L/min] 3.9 L/min CI:  [1.4 L/min/m2-2.1 L/min/m2] 2.1 L/min/m2  Physical Exam:  Rhythm:   sinus  Breath sounds: clear  Heart sounds:  RRR  Incisions:  Dressing dry, intact  Abdomen:  Soft, non-distended, non-tender  Extremities:  Warm, well-perfused  Chest tubes:  Low volume thin serosanguinous output, no air leak    Intake/Output from previous day: 03/30 0701 - 03/31 0700 In: 4744.1 [I.V.:3714.1; Blood:150; NG/GT:30; IV Piggyback:850] Out: 2320 [Urine:1710; Blood:300; Chest Tube:310] Intake/Output this shift: Total I/O In: -  Out: 190 [Urine:150; Chest Tube:40]  Lab Results:  CBC: Recent Labs  02/19/16 0105 02/19/16 0530  WBC 9.0 8.6  HGB 9.8* 9.8*  HCT 29.2* 29.0*  PLT 121* 121*    BMET:  Recent Labs  02/18/16 1945  02/18/16 2344 02/19/16 0530  NA 138  < > 140 140  K 3.7  < > 3.7 4.5  CL 109  < > 104 115*  CO2 20*  --   --  20*  GLUCOSE 176*  < > 142* 123*  BUN 26*  < > 20 18  CREATININE 0.84  < > 0.50 0.79  CALCIUM 8.4*  --   --  7.4*  < > = values in this interval not displayed.   PT/INR:   Recent Labs  02/19/16 0105  LABPROT 16.8*  INR 1.35    CBG (last 3)   Recent Labs  02/19/16 0217 02/19/16 0306 02/19/16 0404  GLUCAP 93 95 101*    ABG    Component  Value Date/Time   PHART 7.266* 02/19/2016 0729   PCO2ART 43.5 02/19/2016 0729   PO2ART 83.0 02/19/2016 0729   HCO3 19.8* 02/19/2016 0729   TCO2 21 02/19/2016 0729   ACIDBASEDEF 7.0* 02/19/2016 0729   O2SAT 94.0 02/19/2016 0729    CXR:  PORTABLE CHEST 1 VIEW  COMPARISON: Portable film earlier in the day.  FINDINGS: Stable cardiomediastinal silhouette. Mild vascular congestion is improved. Swan-Ganz catheter tip main pulmonary artery. ET tube stable 4 cm above carina. Mild LEFT basilar subsegmental atelectasis. Trace LEFT effusion. LEFT chest tube. Mediastinal tube. Orogastric tube. No pneumothorax.  IMPRESSION: Improved aeration.   Electronically Signed  By: Staci Righter M.D.  On: 02/19/2016 07:51        Assessment/Plan: S/P Procedure(s) (LRB): CORONARY ARTERY BYPASS GRAFTING (CABG) times 2 using left internal mammary and right greater saphenous vein (N/A)  Overall doing well POD1 emergency CABG for acute anterior wall STEMI complicated by cardiogenic shock Maintaining NSR w/ stable hemodynamics on very low dose milrinone and Neo drips w/ IABP 1:1 - normal cardiac output and low PA pressures O2 sats 100% on 40% FiO2, CXR looks remarkably clear - looks  ready for extubation soon Acute systolic CHF w/ expected post op volume excess - mild Expected post op acute blood loss anemia, mild   Wean vent and extubate  D/C right radial cath sheath  Wean and D/C IABP  Continue low dose milrinone for now  Mobilize once IABP out  Rexene Alberts, MD 02/19/2016 8:24 AM

## 2016-02-19 NOTE — Procedures (Signed)
Extubation Procedure Note  Patient Details:   Name: Mieke Mauss DOB: [birth year] MRN: [id number]   Airway Documentation:     Evaluation  O2 sats: stable throughout Complications: No apparent complications Patient did tolerate procedure well. Bilateral Breath Sounds: Clear   Yes   Pt extubated to 4L N/C, no stridor noted, NIF > -20, V/C ~1L.  RN at bedside.  Donnetta Hail 02/19/2016, 9:50 AM

## 2016-02-19 NOTE — Care Management Note (Signed)
Case Management Note  Patient Details  Name: Paysli Stanfill MRN: [id number] Date of Birth: [birth year]  Subjective/Objective:           Pt is s/p Procedure:   Emergency Coronary Artery Bypass Grafting x 2 Left Internal Mammary Artery to Distal Left Anterior Descending Coronary Artery Saphenous Vein Graft to First Obtuse Marginal Branch of Left Circumflex Coronary Artery Endoscopic Vein Harvest from Right Thigh         Action/Plan:  PTA - independent from home with husband, husband will provide 24 hour supervision.  CM will continue to monitor for disposition needs   Expected Discharge Date:                  Expected Discharge Plan:  Home/Self Care  In-House Referral:     Discharge planning Services  CM Consult  Post Acute Care Choice:    Choice offered to:     DME Arranged:    DME Agency:     HH Arranged:    HH Agency:     Status of Service:  In process, will continue to follow  Medicare Important Message Given:    Date Medicare IM Given:    Medicare IM give by:    Date Additional Medicare IM Given:    Additional Medicare Important Message give by:     If discussed at [place] of Stay Meetings, dates discussed:    Additional Comments:  Maryclare Labrador, RN 02/19/2016, 12:27 PM

## 2016-02-19 NOTE — Progress Notes (Signed)
      [address]       [place] [address]             [phone]    POD # 2 emergency CABG   Sleeping  BP 99/54 mmHg  Pulse 89  Temp(Src) 99.1 F (37.3 C) (Oral)  Resp 13  Ht 5\' 6"  (1.676 m)  Wt 175 lb 11.3 oz (79.7 kg)  BMI 28.37 kg/m2  SpO2 95%  LMP  (LMP Unknown)  35/16 CI= 2.5   Intake/Output Summary (Last 24 hours) at 02/19/16 1943 Last data filed at 02/19/16 1917  Gross per 24 hour  Intake 6185.74 ml  Output   3205 ml  Net 2980.74 ml   K= 3.5 Hct = 29  Will supplement K  Vetta Couzens C. Roxan Hockey, MD [facility] [phone]

## 2016-02-19 NOTE — Progress Notes (Signed)
Cardiologist: Dr. Percival Spanish Subjective:   post op chest wall pain. Family at bedside  Objective:  Vital Signs in the last 24 hours: Temp:  [94.3 F (34.6 C)-99.5 F (37.5 C)] 99.5 F (37.5 C) (03/31 1000) Pulse Rate:  [0-95] 44 (03/31 1000) Resp:  [0-32] 15 (03/31 1000) BP: (87-130)/(36-80) 112/78 mmHg (03/31 1000) SpO2:  [0 %-100 %] 95 % (03/31 1000) Arterial Line BP: (90-177)/(30-57) 103/49 mmHg (03/31 1000) FiO2 (%):  [40 %-60 %] 40 % (03/31 0905) Weight:  [164 lb (74.39 kg)-175 lb 11.3 oz (79.7 kg)] 175 lb 11.3 oz (79.7 kg) (03/31 0500)  Intake/Output from previous day: 03/30 0701 - 03/31 0700 In: 4744.1 [I.V.:3714.1; Blood:150; NG/GT:30; IV Piggyback:850] Out: 2320 [Urine:1710; Blood:300; Chest Tube:310]   Physical Exam: General: Swan in place, in no acute distress. Head:  Normocephalic and atraumatic. Lungs: Minimal vent noise Heart: Normal S1 and S2.  IABP No murmur, rubs or gallops.  Abdomen: soft, non-tender, positive bowel sounds. Extremities: No clubbing or cyanosis. trace edema. Neurologic: Alert and oriented x 3.    Lab Results:  Recent Labs  02/19/16 0105 02/19/16 0109 02/19/16 0530  WBC 9.0  --  8.6  HGB 9.8* 8.8* 9.8*  PLT 121*  --  121*    Recent Labs  02/18/16 1945  02/19/16 0109 02/19/16 0530  NA 138  < > 141 140  K 3.7  < > 3.4* 4.5  CL 109  < > 109 115*  CO2 20*  --   --  20*  GLUCOSE 176*  < > 119* 123*  BUN 26*  < > 19 18  CREATININE 0.84  < > 0.60 0.79  < > = values in this interval not displayed. No results for input(s): TROPONINI in the last 72 hours.  Invalid input(s): CK, MB Hepatic Function Panel  Recent Labs  02/18/16 1945  PROT 5.6*  ALBUMIN 3.1*  AST 59*  ALT 50  ALKPHOS 64  BILITOT 0.9   No results for input(s): CHOL in the last 72 hours. No results for input(s): PROTIME in the last 72 hours.  Imaging: Dg Chest Port 1 View  02/19/2016  CLINICAL DATA:  Status post CABG. EXAM: PORTABLE CHEST 1 VIEW  COMPARISON:  Portable film earlier in the day. FINDINGS: Stable cardiomediastinal silhouette. Mild vascular congestion is improved. Swan-Ganz catheter tip main pulmonary artery. ET tube stable 4 cm above carina. Mild LEFT basilar subsegmental atelectasis. Trace LEFT effusion. LEFT chest tube. Mediastinal tube. Orogastric tube. No pneumothorax. IMPRESSION: Improved aeration. Electronically Signed   By: Staci Righter M.D.   On: 02/19/2016 07:51   Dg Chest Port 1 View  02/19/2016  CLINICAL DATA:  Intra-aortic balloon pump placement. Initial encounter. EXAM: PORTABLE CHEST 1 VIEW COMPARISON:  Chest radiograph performed 02/18/2016 FINDINGS: The patient's intra-aortic balloon pump tip is noted 2-3 cm below the aortic knob. The patient's endotracheal tube is seen ending 4 cm above the carina. An enteric tube is noted ending overlying the body of the stomach, with the side port about the gastroesophageal junction. Mediastinal drains and a left-sided chest tube are noted. A right IJ Swan-Ganz catheter is noted ending at the pulmonary outflow tract. Vascular congestion is noted, with increased interstitial markings, concerning for mild interstitial edema. No pleural effusion or pneumothorax is seen. The cardiomediastinal silhouette is borderline normal in size. The patient is status post median sternotomy, with evidence of prior CABG. No acute osseous abnormalities are seen. IMPRESSION: 1. Intra-aortic balloon pump tip noted 2-3 cm  below the aortic knob. This could be advanced 2-3 cm. 2. Endotracheal tube seen ending 4 cm above the carina. 3. Vascular congestion, with increased interstitial markings, concerning for mild pulmonary edema. These results were called by telephone at the time of interpretation on 02/19/2016 at 1:16 am to [facility] on [facility], who verbally acknowledged these results. Electronically Signed   By: Garald Balding M.D.   On: 02/19/2016 01:17   Dg Chest Port 1 View  02/18/2016  CLINICAL DATA:  Chest  pain and shortness of breath for several hours EXAM: PORTABLE CHEST 1 VIEW COMPARISON:  11/11/2015 FINDINGS: Cardiac shadow is at the upper limits of normal in size. The lungs are well aerated bilaterally. No focal infiltrate or sizable effusion is seen. No bony abnormality is noted. IMPRESSION: No active disease. Electronically Signed   By: Inez Catalina M.D.   On: 02/18/2016 19:28   Personally viewed.   Telemetry: No adverse rhythms Personally viewed.   EKG:  Prior lateral STEMI Personally viewed.  Cardiac Studies:  Cath: 02/18/16  Ost LM to LM lesion, 60% stenosed.  Ost LAD-1 lesion, 99% stenosed. Ost LAD-2 lesion, 70% stenosed. Mid LAD lesion, 40% stenosed. Dist LAD lesion, 40% stenosed.  Prox Cx lesion, 95% stenosed - prior to OM1. Mid Cx lesion, 70% stenosed just prior to OM 2 takeoff  Ost RCA to Dist RCA lesion, 100% stenosed. PDA and posterolateral branch are filled via left-to-right collaterals  There is severe left ventricular systolic dysfunction. Elevated LVEDP   Patient has severe multivessel disease with thrombotic stenosis of the ostial LAD with proximal stenosis distal to the thrombus. There is also apparent thrombus in the mid LAD as well as proximal circumflex with 95% stenosis of the early proximal circumflex. Additional thrombus is noted in the bifurcation of AV groove circumflex and OM 2. The RCA appears to be chronically occluded with left-to-right collaterals filling the PDA and posterior lateral branches.  Appears to be severe ischemic cardiomyopathy with acute combined systolic and diastolic heart failure.  After reviewing the angiographic images with severe LV dysfunction, it was clear the course of action was to contact CT surgery. Dr. Roxy Manns arrived rapidly to the Cath Lab and agreed to go patient to the OR emergently. As she was having ongoing 6/10 anginal pain, an intra-aortic balloon pump was placed.  The patient was taken directly from the Cath Lab to the  operating room for emergent CABG.  Dr. Ellyn Hack.  CABG, emergency, OP note: 02/18/16 Procedure:   Emergency Coronary Artery Bypass Grafting x 2 Left Internal Mammary Artery to Distal Left Anterior Descending Coronary Artery Saphenous Vein Graft to First Obtuse Marginal Branch of Left Circumflex Coronary Artery Endoscopic Vein Harvest from Right Thigh  Surgeon:Clarence H. Roxy Manns, MD  Assistant:Donielle Liston Alba, PA-C  Anesthesia:George Kalman Shan, MD  Operative Findings:  Acute anterior wall myocardial infarction  Severe hypokinesis/akinesis of distal anterior wall, apex and anterior septum  Good quality LIMA conduit for grafting  Good quality SVG conduit for grafting  Good quality target vessels for grafting  Terminal branches of distal RCA too small and diffusely diseased for grafting  Improved LV function after separation from CPB  Meds: Scheduled Meds: . [START ON 02/20/2016] acetaminophen  1,000 mg Oral 4 times per day   Or  . [START ON 02/20/2016] acetaminophen (TYLENOL) oral liquid 160 mg/5 mL  1,000 mg Per Tube 4 times per day  . antiseptic oral rinse  7 mL Mouth Rinse QID  . [START ON 02/20/2016] aspirin  EC  325 mg Oral Daily   Or  . [START ON 02/20/2016] aspirin  324 mg Per Tube Daily  . [START ON 02/20/2016] bisacodyl  10 mg Oral Daily   Or  . [START ON 02/20/2016] bisacodyl  10 mg Rectal Daily  . cefUROXime (ZINACEF)  IV  1.5 g Intravenous Q12H  . chlorhexidine gluconate (SAGE KIT)  15 mL Mouth Rinse BID  . [START ON 02/20/2016] docusate sodium  200 mg Oral Daily  . insulin aspart  0-24 Units Subcutaneous 6 times per day  . metoprolol tartrate  12.5 mg Oral BID   Or  . metoprolol tartrate  12.5 mg Per Tube BID  . [START ON 02/20/2016] pantoprazole  40 mg Oral Daily  . [START ON 02/20/2016] sodium chloride flush  3 mL Intravenous Q12H  . vancomycin  1,000 mg Intravenous Once   Continuous Infusions: . sodium  chloride 20 mL/hr at 02/19/16 0319  . sodium chloride 100 mL/hr at 02/19/16 0700  . [START ON 02/20/2016] sodium chloride    . sodium chloride    . dexmedetomidine 0.1 mcg/kg/hr (02/19/16 0900)  . milrinone 0.2 mcg/kg/min (02/19/16 0900)  . phenylephrine (NEO-SYNEPHRINE) Adult infusion 10 mcg/min (02/19/16 0930)   PRN Meds:.sodium chloride, albumin human, metoprolol, midazolam, morphine injection, ondansetron (ZOFRAN) IV, oxyCODONE, [START ON 02/20/2016] sodium chloride flush, traMADol  Assessment/Plan:  Principal Problem:   S/P Emergency CABG x 2 Active Problems:   CAD (coronary artery disease)   Unstable angina (HCC)   Hyperlipidemia   Acute ST elevation myocardial infarction (STEMI) involving left anterior descending coronary artery (HCC)   Coronary artery thrombosis (HCC)   Acute ST elevation myocardial infarction (STEMI) involving left circumflex coronary artery (HCC)   Acute ST elevation myocardial infarction (STEMI) of lateral wall (HCC)   Severe CAD post emergent CABG, STEMI lateral. -DC IABP -milrinone wean -ambulate when able -will add atorvastatin 80 -ASA, low dose Bb  Has history of MI in 1996, VF arrest. Received TPA and POBA of RCA at that time. RCA chronic occluded on cath  Will follow.   Hailey Wright, [place] 02/19/2016, 10:20 AM

## 2016-02-19 NOTE — Transfer of Care (Signed)
Immediate Anesthesia Transfer of Care Note  Patient: Hailey Wright  Procedure(s) Performed: Procedure(s): CORONARY ARTERY BYPASS GRAFTING (CABG) times 2 using left internal mammary and right greater saphenous vein (N/A)  Patient Location: SICU  Anesthesia Type:General  Level of Consciousness: Patient remains intubated per anesthesia plan  Airway & Oxygen Therapy: Patient placed on Ventilator (see vital sign flow sheet for setting)  Post-op Assessment: Report given to RN and Post -op Vital signs reviewed and stable  Post vital signs: Reviewed and stable  Last Vitals:  Filed Vitals:   02/18/16 2030 02/18/16 2034  BP: 99/59 117/80  Pulse: 50 75  Temp:    Resp: 21 19    Complications: No apparent anesthesia complications

## 2016-02-19 NOTE — Op Note (Addendum)
CARDIOTHORACIC SURGERY OPERATIVE NOTE  Date of Procedure:  02/18/2016  Preoperative Diagnosis:   Acute Anterior Wall ST-segment Elevation Myocardial Infarction  Left Main Coronary Artery Disease  Severe 3-vessel Coronary Artery Disease  Postoperative Diagnosis: Same  Procedure:    Emergency Coronary Artery Bypass Grafting x 2   Left Internal Mammary Artery to Distal Left Anterior Descending Coronary Artery  Saphenous Vein Graft to First Obtuse Marginal Branch of Left Circumflex Coronary Artery  Endoscopic Vein Harvest from Right Thigh  Surgeon: Valentina Gu. Roxy Manns, MD  Assistant: Nani Skillern, PA-C  Anesthesia: Myrtie Soman, MD  Operative Findings:  Acute anterior wall myocardial infarction  Severe hypokinesis/akinesis of distal anterior wall, apex and anterior septum  Good quality LIMA conduit for grafting  Good quality SVG conduit for grafting  Good quality target vessels for grafting  Terminal branches of distal RCA too small and diffusely diseased for grafting  Improved LV function after separation from CPB     BRIEF CLINICAL NOTE AND INDICATIONS FOR SURGERY  The patient is a 70 year old female with history of coronary artery disease status post acute inferior wall myocardial infarction complicated by ventricular fibrillation arrest in the remote past, hyperlipidemia, and borderline type 2 diabetes mellitus who is referred for emergent cardiac surgical consultation secondary to left main disease with critical three-vessel coronary artery disease and acute evolving ST segment elevation myocardial infarction complicated by cardiogenic shock. The patient suffered an acute myocardial infarction in 1991 that Presented with VF arrest. She was treated with TPA. In 1996 diagnostic cardiac catheterization was performed revealing multivessel coronary artery disease including 90% stenosis of mid right coronary artery. She was treated with balloon angioplasty. She has  been followed for many years by Dr. Percival Spanish who saw her most recently in October 2016. At that time she was doing well.  Over the past week the patient has had accelerating symptoms of angina pectoris. She had 2 episodes of nocturnal chest pain that awoke her from her sleep. She took sublingual nitroglycerin with some relief. The pain was quite severe and prompted her to call the cardiology office for an appointment. She was seen in the office yesterday by Dr. Oval Linsey. Elective diagnostic cardiac catheterization was scheduled for tomorrow.   Late this afternoon the patient developed severe substernal chest pain at rest that persisted despite nitroglycerin. She took a total of 3 nitroglycerin and 4 baby aspirin without any relief. EMS was called. She became nauseated and vomited en route. She was hypotensive and resuscitated using volume. EKG revealed acute ST segment elevation. Code STEMI was activated and the patient was brought rectally to the Cath Lab from the emergency department. Diagnostic cardiac catheterization performed by Dr. Ellyn Hack demonstrates left main disease with critical subtotal ostial occlusion of the left anterior descending coronary artery with acute thrombus. There is acute thrombus in the left circumflex coronary artery. There is chronic occlusion of the right coronary artery with left-to-right collateral filling of the terminal branches of the right coronary artery. There is severe hypokinesis or akinesis of the entire distal anterior wall and apex. The patient is hypertensive in her minimally in the Cath Lab. Emergency cardiac surgical consultation was requested and an intra-aortic balloon pump was placed from the right femoral approach.  The patient has been seen in consultation and counseled at length regarding the indications, risks and potential benefits of surgery.  All questions have been answered, and the patient provides full informed consent for the operation as  described.    DETAILS OF  THE OPERATIVE PROCEDURE  Preparation:  The patient is brought to the operating room on the above mentioned date and central monitoring was established by the anesthesia team including placement of Swan-Ganz catheter.  The patient's right radial artery sheath from catheterization was monitored. The patient is placed in the supine position on the operating table.  Intravenous antibiotics are administered. General endotracheal anesthesia is induced uneventfully. A Foley catheter is placed.  Baseline transesophageal echocardiogram was performed.  Findings were notable for severe hypokinesis or akinesis of the distal anterior wall, apex and interventricular septum.  The lateral wall and inferior wall were moving well.  There was trace mitral regurgitation.  The aortic valve appeared normal.  The patient's chest, abdomen, both groins, and both lower extremities are prepared and draped in a sterile manner. A time out procedure is performed.   Surgical Approach and Conduit Harvest:  A median sternotomy incision was performed and the left internal mammary artery is dissected from the chest wall and prepared for bypass grafting. The left internal mammary artery is notably good quality conduit. Simultaneously, the greater saphenous vein is obtained from the patient's right thigh using endoscopic vein harvest technique. The saphenous vein is notably good quality conduit. After removal of the saphenous vein, the small surgical incisions in the lower extremity are closed with absorbable suture. Following systemic heparinization, the left internal mammary artery was transected distally noted to have excellent flow.   Extracorporeal Cardiopulmonary Bypass and Myocardial Protection:  The pericardium is opened. The ascending aorta is in appearance. The ascending aorta and the right atrium are cannulated for cardiopulmonary bypass.  Adequate heparinization is verified.   A retrograde  cardioplegia cannula is placed through the right atrium into the coronary sinus.  The entire pre-bypass portion of the operation was notable for stable hemodynamics.  Cardiopulmonary bypass was begun and the surface of the heart is inspected. Distal target vessels are selected for coronary artery bypass grafting. A cardioplegia cannula is placed in the ascending aorta.  A temperature probe was placed in the interventricular septum.  The patient is allowed to cool passively to [facility] systemic temperature.  The aortic cross clamp is applied and cold blood cardioplegia is delivered initially in an antegrade fashion through the aortic root.   Supplemental cardioplegia is given retrograde through the coronary sinus catheter.  Iced saline slush is applied for topical hypothermia.  The initial cardioplegic arrest is rapid with early diastolic arrest.  Repeat doses of cardioplegia are administered intermittently throughout the entire cross clamp portion of the operation through the aortic root, through the coronary sinus catheter, and through subsequently placed vein grafts in order to maintain completely flat electrocardiogram and septal myocardial temperature below 15C.  Myocardial protection was felt to be excellent.  Coronary Artery Bypass Grafting:   The first obtuse marginal branch of the left circumflex coronary artery was grafted using a reversed saphenous vein graft in an end-to-side fashion.  At the site of distal anastomosis the target vessel was good quality and measured approximately 1.8 mm in diameter.  The distal left anterior coronary artery was grafted with the left internal mammary artery in an end-to-side fashion.  At the site of distal anastomosis the target vessel was good quality and measured approximately 2.0 mm in diameter.  All proximal vein graft anastomoses were placed directly to the ascending aorta prior to removal of the aortic cross clamp.  The septal myocardial temperature rose  rapidly after reperfusion of the left internal mammary artery graft.  The  aortic cross clamp was removed after a total cross clamp time of 46 minutes.   Procedure Completion:  All proximal and distal coronary anastomoses were inspected for hemostasis and appropriate graft orientation. Epicardial pacing wires are fixed to the right ventricular outflow tract and to the right atrial appendage. The patient is rewarmed to 37C temperature. The patient is weaned and disconnected from cardiopulmonary bypass.  The patient's rhythm at separation from bypass was sinus.  The patient was weaned from cardiopulmonary bypass without any inotropic support other than IABP at 1:1 ratio. Total cardiopulmonary bypass time for the operation was 57 minutes.  Followup transesophageal echocardiogram performed after separation from bypass revealed improved LV function.  The aortic and venous cannula were removed uneventfully. Protamine was administered to reverse the anticoagulation. The mediastinum and pleural space were inspected for hemostasis and irrigated with saline solution. The mediastinum and the left pleural space were drained using 3 chest tubes placed through separate stab incisions inferiorly.  The soft tissues anterior to the aorta were reapproximated loosely. The sternum is closed with double strength sternal wire. The soft tissues anterior to the sternum were closed in multiple layers and the skin is closed with a running subcuticular skin closure.  The post-bypass portion of the operation was notable for stable rhythm and hemodynamics.  No blood products were administered during the operation.   Disposition:  The patient tolerated the procedure well and is transported to the surgical intensive care in stable condition. There are no intraoperative complications. All sponge instrument and needle counts are verified correct at completion of the operation.    Valentina Gu. Roxy Manns MD 02/19/2016 12:31  AM

## 2016-02-19 NOTE — Progress Notes (Addendum)
Dr. Roxy Manns called and made aware of Radiologist's reading of portable CXR done in the OR, specifically the reading of balloon pump placement. Results were called to Dustin Folks RN and they relayed by me to Dr. Roxy Manns. No new orders at this time.   Verbal orders received from Dr. Roxy Manns to leave radial sheath in place being used as a-line until the morning.

## 2016-02-20 LAB — CBC
HEMATOCRIT: 28.9 % — AB (ref 36.0–46.0)
HEMATOCRIT: 28.7 % — AB (ref 36.0–46.0)
Hemoglobin: 9.1 g/dL — ABNORMAL LOW (ref 12.0–15.0)
Hemoglobin: 9.5 g/dL — ABNORMAL LOW (ref 12.0–15.0)
MCH: 29.1 pg (ref 26.0–34.0)
MCH: 30.7 pg (ref 26.0–34.0)
MCHC: 31.5 g/dL (ref 30.0–36.0)
MCHC: 33.1 g/dL (ref 30.0–36.0)
MCV: 92.3 fL (ref 78.0–100.0)
MCV: 92.9 fL (ref 78.0–100.0)
PLATELETS: 121 10*3/uL — AB (ref 150–400)
Platelets: 136 10*3/uL — ABNORMAL LOW (ref 150–400)
RBC: 3.13 MIL/uL — ABNORMAL LOW (ref 3.87–5.11)
RBC: 3.09 MIL/uL — AB (ref 3.87–5.11)
RDW: 14.8 % (ref 11.5–15.5)
RDW: 14.8 % (ref 11.5–15.5)
WBC: 11.2 10*3/uL — AB (ref 4.0–10.5)
WBC: 11.5 10*3/uL — ABNORMAL HIGH (ref 4.0–10.5)

## 2016-02-20 LAB — GLUCOSE, CAPILLARY
GLUCOSE-CAPILLARY: 176 mg/dL — AB (ref 65–99)
GLUCOSE-CAPILLARY: 143 mg/dL — AB (ref 65–99)
GLUCOSE-CAPILLARY: 149 mg/dL — AB (ref 65–99)
GLUCOSE-CAPILLARY: 138 mg/dL — AB (ref 65–99)
Glucose-Capillary: 137 mg/dL — ABNORMAL HIGH (ref 65–99)
Glucose-Capillary: 144 mg/dL — ABNORMAL HIGH (ref 65–99)

## 2016-02-20 LAB — MAGNESIUM
MAGNESIUM: 2.4 mg/dL (ref 1.7–2.4)
Magnesium: 2.1 mg/dL (ref 1.7–2.4)

## 2016-02-20 LAB — CREATININE, SERUM
Creatinine, Ser: 0.76 mg/dL (ref 0.44–1.00)
GFR calc Af Amer: >60 mL/min (ref >60)
GFR calc non Af Amer: >60 mL/min (ref >60)

## 2016-02-20 LAB — BASIC METABOLIC PANEL
ANION GAP: 5 (ref 5–15)
BUN: 16 mg/dL (ref 6–20)
CALCIUM: 7.5 mg/dL — AB (ref 8.9–10.3)
CO2: 22 mmol/L (ref 22–32)
CREATININE: 0.71 mg/dL (ref 0.44–1.00)
Chloride: 108 mmol/L (ref 101–111)
Glucose, Bld: 238 mg/dL — ABNORMAL HIGH (ref 65–99)
Potassium: 3.8 mmol/L (ref 3.5–5.1)
SODIUM: 135 mmol/L (ref 135–145)

## 2016-02-20 MED ORDER — METFORMIN HCL ER 500 MG PO TB24
500.0000 mg | ORAL_TABLET | Freq: Every day | ORAL | Status: DC
  Administered 2016-02-20 – 2016-02-22 (×3): 500 mg via ORAL
  Filled 2016-02-20 (×4): qty 1

## 2016-02-20 MED ORDER — POTASSIUM CHLORIDE ER 10 MEQ PO TBCR
20.0000 meq | EXTENDED_RELEASE_TABLET | Freq: Two times a day (BID) | ORAL | Status: DC
  Administered 2016-02-20 – 2016-02-23 (×7): 20 meq via ORAL
  Filled 2016-02-20 (×15): qty 2

## 2016-02-20 MED ORDER — CETYLPYRIDINIUM CHLORIDE 0.05 % MT LIQD
7.0000 mL | Freq: Two times a day (BID) | OROMUCOSAL | Status: DC
  Administered 2016-02-20: 7 mL via OROMUCOSAL

## 2016-02-20 MED ORDER — FUROSEMIDE 10 MG/ML IJ SOLN
20.0000 mg | Freq: Two times a day (BID) | INTRAMUSCULAR | Status: DC
  Administered 2016-02-20 – 2016-02-23 (×5): 20 mg via INTRAVENOUS
  Filled 2016-02-20 (×4): qty 2

## 2016-02-20 NOTE — Anesthesia Postprocedure Evaluation (Signed)
Anesthesia Post Note  Patient: Hailey Wright  Procedure(s) Performed: Procedure(s) (LRB): CORONARY ARTERY BYPASS GRAFTING (CABG) times 2 using left internal mammary and right greater saphenous vein (N/A)  Patient location during evaluation: SICU Anesthesia Type: General Level of consciousness: patient remains intubated per anesthesia plan Pain management: pain level controlled Vital Signs Assessment: post-procedure vital signs reviewed and stable Respiratory status: patient remains intubated per anesthesia plan Cardiovascular status: stable Postop Assessment: no signs of nausea or vomiting Anesthetic complications: no    Last Vitals:  Filed Vitals:   02/20/16 1400 02/20/16 1500  BP: 101/51 101/61  Pulse: 85 79  Temp:    Resp: 16 16    Last Pain:  Filed Vitals:   02/20/16 1516  PainSc: 0-No pain                 Jashad Depaula S

## 2016-02-20 NOTE — Progress Notes (Signed)
      [address]       ,[place] [address]             [phone]      Just ambulated around the unit Looks good BP 113/64 mmHg  Pulse 79  Temp(Src) 98.5 F (36.9 C) (Oral)  Resp 16  Ht 5\' 6"  (1.676 m)  Wt 180 lb 12.4 oz (82 kg)  BMI 29.19 kg/m2  SpO2 96%  LMP  (LMP Unknown)  Intake/Output Summary (Last 24 hours) at 02/20/16 1815 Last data filed at 02/20/16 1600  Gross per 24 hour  Intake 2098.08 ml  Output   1950 ml  Net 148.08 ml    Doing well  Remo Lipps C. Roxan Hockey, MD [facility] [phone]

## 2016-02-20 NOTE — Progress Notes (Signed)
2 Days Post-Op Procedure(s) (LRB): CORONARY ARTERY BYPASS GRAFTING (CABG) times 2 using left internal mammary and right greater saphenous vein (N/A) Subjective: C/o pain left side of chest  Objective: Vital signs in last 24 hours: Temp:  [99.1 F (37.3 C)-99.9 F (37.7 C)] 99.7 F (37.6 C) (04/01 0900) Pulse Rate:  [29-91] 89 (04/01 0900) Cardiac Rhythm:  [-] Normal sinus rhythm (04/01 0800) Resp:  [0-29] 17 (04/01 0900) BP: (81-121)/(48-101) 108/60 mmHg (04/01 0900) SpO2:  [90 %-100 %] 93 % (04/01 0900) Arterial Line BP: (87-247)/(40-240) 90/49 mmHg (03/31 1400) Weight:  [180 lb 12.4 oz (82 kg)] 180 lb 12.4 oz (82 kg) (04/01 0500)  Hemodynamic parameters for last 24 hours: PAP: (27-45)/(13-23) 37/21 mmHg CO:  [3.2 L/min-4.7 L/min] 4 L/min CI:  [1.7 L/min/m2-2.6 L/min/m2] 2.2 L/min/m2  Intake/Output from previous day: 03/31 0701 - 04/01 0700 In: 2252.7 [P.O.:60; I.V.:1292.7; IV Piggyback:900] Out: 1875 [Urine:1285; Chest Tube:590] Intake/Output this shift: Total I/O In: 133.6 [I.V.:83.6; IV Piggyback:50] Out: 50 [Urine:50]  General appearance: alert, cooperative and no distress Neurologic: intact Heart: regular rate and rhythm Lungs: diminished breath sounds bibasilar Abdomen: normal findings: soft, non-tender  Lab Results:  Recent Labs  02/19/16 1640 02/19/16 1643 02/20/16 0501  WBC 10.8*  --  11.2*  HGB 9.8* 9.9* 9.1*  HCT 30.6* 29.0* 28.9*  PLT 142*  --  136*   BMET:  Recent Labs  02/19/16 0530  02/19/16 1643 02/20/16 0501  NA 140  --  141 135  K 4.5  --  3.5 3.8  CL 115*  --  111 108  CO2 20*  --   --  22  GLUCOSE 123*  --  149* 238*  BUN 18  --  18 16  CREATININE 0.79  < > 0.70 0.71  CALCIUM 7.4*  --   --  7.5*  < > = values in this interval not displayed.  PT/INR:  Recent Labs  02/19/16 0105  LABPROT 16.8*  INR 1.35   ABG    Component Value Date/Time   PHART 7.330* 02/19/2016 1148   HCO3 22.6 02/19/2016 1148   TCO2 23 02/19/2016 1643    ACIDBASEDEF 3.0* 02/19/2016 1148   O2SAT 93.0 02/19/2016 1148   CBG (last 3)   Recent Labs  02/19/16 1936 02/19/16 2314 02/20/16 0323  GLUCAP 154* 176* 143*    Assessment/Plan: S/P Procedure(s) (LRB): CORONARY ARTERY BYPASS GRAFTING (CABG) times 2 using left internal mammary and right greater saphenous vein (N/A) -  POD # 2 emergency CABG (1 1/2 days postop)  CV- stable s/p STEMI, emergency CABG- dc swan  Wean milrinone off  RESP- IS for basilar atelectasis  RENAL- creatinine and lytes OK  Begin diuresis, supplement K  ENDO- CBG elevated this AM- restart metformin, continue SSI  Dc chest tubes  mobilize   LOS: 1 day    Melrose Nakayama 02/20/2016

## 2016-02-21 ENCOUNTER — Inpatient Hospital Stay (HOSPITAL_COMMUNITY): Payer: Medicare Other

## 2016-02-21 LAB — BASIC METABOLIC PANEL
ANION GAP: 6 (ref 5–15)
BUN: 17 mg/dL (ref 6–20)
CHLORIDE: 105 mmol/L (ref 101–111)
CO2: 26 mmol/L (ref 22–32)
CREATININE: 0.65 mg/dL (ref 0.44–1.00)
Calcium: 8.3 mg/dL — ABNORMAL LOW (ref 8.9–10.3)
GFR calc non Af Amer: >60 mL/min (ref >60)
Glucose, Bld: 137 mg/dL — ABNORMAL HIGH (ref 65–99)
Potassium: 4 mmol/L (ref 3.5–5.1)
SODIUM: 137 mmol/L (ref 135–145)

## 2016-02-21 LAB — CBC
HEMATOCRIT: 28.2 % — AB (ref 36.0–46.0)
HEMOGLOBIN: 9.4 g/dL — AB (ref 12.0–15.0)
MCH: 30.6 pg (ref 26.0–34.0)
MCHC: 33.3 g/dL (ref 30.0–36.0)
MCV: 91.9 fL (ref 78.0–100.0)
Platelets: 116 10*3/uL — ABNORMAL LOW (ref 150–400)
RBC: 3.07 MIL/uL — AB (ref 3.87–5.11)
RDW: 14.6 % (ref 11.5–15.5)
WBC: 11.5 10*3/uL — AB (ref 4.0–10.5)

## 2016-02-21 LAB — GLUCOSE, CAPILLARY
GLUCOSE-CAPILLARY: 120 mg/dL — AB (ref 65–99)
GLUCOSE-CAPILLARY: 126 mg/dL — AB (ref 65–99)
GLUCOSE-CAPILLARY: 119 mg/dL — AB (ref 65–99)
GLUCOSE-CAPILLARY: 122 mg/dL — AB (ref 65–99)
Glucose-Capillary: 117 mg/dL — ABNORMAL HIGH (ref 65–99)
Glucose-Capillary: 108 mg/dL — ABNORMAL HIGH (ref 65–99)

## 2016-02-21 MED ORDER — MOVING RIGHT ALONG BOOK
Freq: Once | Status: AC
  Administered 2016-02-21: 23:00:00
  Filled 2016-02-21: qty 1

## 2016-02-21 MED ORDER — SODIUM CHLORIDE 0.9 % IV SOLN: 250.0000 mL | INTRAVENOUS | Status: DC | PRN

## 2016-02-21 MED ORDER — SODIUM CHLORIDE 0.9% FLUSH: 3.0000 mL | INTRAVENOUS | Status: DC | PRN

## 2016-02-21 MED ORDER — INSULIN ASPART 100 UNIT/ML ~~LOC~~ SOLN
0.0000 [IU] | Freq: Three times a day (TID) | SUBCUTANEOUS | Status: DC
  Administered 2016-02-21: 2 [IU] via SUBCUTANEOUS

## 2016-02-21 MED ORDER — MAGNESIUM HYDROXIDE 400 MG/5ML PO SUSP: 30.0000 mL | Freq: Every day | ORAL | Status: DC | PRN

## 2016-02-21 MED ORDER — ENOXAPARIN SODIUM 40 MG/0.4ML ~~LOC~~ SOLN
40.0000 mg | SUBCUTANEOUS | Status: DC
  Administered 2016-02-21: 40 mg via SUBCUTANEOUS
  Filled 2016-02-21: qty 0.4

## 2016-02-21 MED ORDER — ZOLPIDEM TARTRATE 5 MG PO TABS: 5.0000 mg | ORAL_TABLET | Freq: Every evening | ORAL | Status: DC | PRN

## 2016-02-21 MED ORDER — SODIUM CHLORIDE 0.9% FLUSH
3.0000 mL | Freq: Two times a day (BID) | INTRAVENOUS | Status: DC
  Administered 2016-02-21 – 2016-02-23 (×4): 3 mL via INTRAVENOUS

## 2016-02-21 MED ORDER — ATENOLOL 25 MG PO TABS
12.5000 mg | ORAL_TABLET | Freq: Every day | ORAL | Status: DC
Start: 2016-02-21 — End: 2016-02-22
  Filled 2016-02-21: qty 1

## 2016-02-21 MED ORDER — ALUM & MAG HYDROXIDE-SIMETH 200-200-20 MG/5ML PO SUSP: 15.0000 mL | ORAL | Status: DC | PRN

## 2016-02-21 NOTE — Progress Notes (Signed)
3 Days Post-Op Procedure(s) (LRB): CORONARY ARTERY BYPASS GRAFTING (CABG) times 2 using left internal mammary and right greater saphenous vein (N/A) Subjective: No complaints  Objective: Vital signs in last 24 hours: Temp:  [98.4 F (36.9 C)-100 F (37.8 C)] 99 F (37.2 C) (04/02 0337) Pulse Rate:  [69-95] 69 (04/02 0800) Cardiac Rhythm:  [-] Normal sinus rhythm (04/02 0800) Resp:  [14-25] 15 (04/02 0800) BP: (86-120)/(51-78) 95/59 mmHg (04/02 0800) SpO2:  [90 %-98 %] 90 % (04/02 0800) Weight:  [179 lb 8 oz (81.421 kg)] 179 lb 8 oz (81.421 kg) (04/02 0500)  Hemodynamic parameters for last 24 hours: PAP: (36-38)/(19-21) 36/20 mmHg  Intake/Output from previous day: 04/01 0701 - 04/02 0700 In: 1347.1 [P.O.:960; I.V.:287.1; IV Piggyback:100] Out: 2620 [Urine:2580; Chest Tube:40] Intake/Output this shift:    General appearance: alert, cooperative and no distress Neurologic: intact Heart: regular rate and rhythm Lungs: diminished breath sounds bibasilar Abdomen: normal findings: soft, non-tender  Lab Results:  Recent Labs  02/20/16 1600 02/21/16 0511  WBC 11.5* 11.5*  HGB 9.5* 9.4*  HCT 28.7* 28.2*  PLT 121* 116*   BMET:  Recent Labs  02/20/16 0501 02/20/16 1600 02/21/16 0511  NA 135  --  137  K 3.8  --  4.0  CL 108  --  105  CO2 22  --  26  GLUCOSE 238*  --  137*  BUN 16  --  17  CREATININE 0.71 0.76 0.65  CALCIUM 7.5*  --  8.3*    PT/INR:  Recent Labs  02/19/16 0105  LABPROT 16.8*  INR 1.35   ABG    Component Value Date/Time   PHART 7.330* 02/19/2016 1148   HCO3 22.6 02/19/2016 1148   TCO2 23 02/19/2016 1643   ACIDBASEDEF 3.0* 02/19/2016 1148   O2SAT 93.0 02/19/2016 1148   CBG (last 3)   Recent Labs  02/20/16 1936 02/20/16 2325 02/21/16 0335  GLUCAP 138* 120* 117*    Assessment/Plan: S/P Procedure(s) (LRB): CORONARY ARTERY BYPASS GRAFTING (CABG) times 2 using left internal mammary and right greater saphenous vein (N/A) Plan for  transfer to step-down: see transfer orders  CV- stable in SR, BP a little low but not a significant problem  RESP- bibasilar atelectasis- continue IS  RENAL- creatinine and lytes OK  Dc Foley  ENDO- CBG well controlled, continue metformin, SSI  DVT prophylaxis- SCD in place will add enoxaparin   LOS: 2 days    Melrose Nakayama 02/21/2016

## 2016-02-22 ENCOUNTER — Inpatient Hospital Stay (HOSPITAL_COMMUNITY): Payer: Medicare Other

## 2016-02-22 ENCOUNTER — Encounter (HOSPITAL_COMMUNITY): Payer: Self-pay | Admitting: General Practice

## 2016-02-22 LAB — TYPE AND SCREEN
ABO/RH(D): O NEG
Antibody Screen: NEGATIVE
UNIT DIVISION: 0
UNIT DIVISION: 0
Unit division: 0
Unit division: 0

## 2016-02-22 LAB — CBC
HCT: 27.3 % — ABNORMAL LOW (ref 36.0–46.0)
Hemoglobin: 9.2 g/dL — ABNORMAL LOW (ref 12.0–15.0)
MCH: 30.5 pg (ref 26.0–34.0)
MCHC: 33.7 g/dL (ref 30.0–36.0)
MCV: 90.4 fL (ref 78.0–100.0)
Platelets: 127 10*3/uL — ABNORMAL LOW (ref 150–400)
RBC: 3.02 MIL/uL — ABNORMAL LOW (ref 3.87–5.11)
RDW: 14.3 % (ref 11.5–15.5)
WBC: 7.4 10*3/uL (ref 4.0–10.5)

## 2016-02-22 LAB — POCT I-STAT, CHEM 8
BUN: 20 mg/dL (ref 6–20)
CALCIUM ION: 1.22 mmol/L (ref 1.13–1.30)
CHLORIDE: 102 mmol/L (ref 101–111)
CREATININE: 0.6 mg/dL (ref 0.44–1.00)
Glucose, Bld: 163 mg/dL — ABNORMAL HIGH (ref 65–99)
HEMATOCRIT: 27 % — AB (ref 36.0–46.0)
Hemoglobin: 9.2 g/dL — ABNORMAL LOW (ref 12.0–15.0)
Potassium: 4 mmol/L (ref 3.5–5.1)
Sodium: 140 mmol/L (ref 135–145)
TCO2: 24 mmol/L (ref 0–100)

## 2016-02-22 LAB — BASIC METABOLIC PANEL
Anion gap: 6 (ref 5–15)
BUN: 16 mg/dL (ref 6–20)
CALCIUM: 8 mg/dL — AB (ref 8.9–10.3)
CO2: 26 mmol/L (ref 22–32)
CREATININE: 0.67 mg/dL (ref 0.44–1.00)
Chloride: 107 mmol/L (ref 101–111)
GFR calc non Af Amer: >60 mL/min (ref >60)
Glucose, Bld: 99 mg/dL (ref 65–99)
Potassium: 4.1 mmol/L (ref 3.5–5.1)
SODIUM: 139 mmol/L (ref 135–145)

## 2016-02-22 LAB — GLUCOSE, CAPILLARY
GLUCOSE-CAPILLARY: 84 mg/dL (ref 65–99)
GLUCOSE-CAPILLARY: 78 mg/dL (ref 65–99)
Glucose-Capillary: 78 mg/dL (ref 65–99)
Glucose-Capillary: 107 mg/dL — ABNORMAL HIGH (ref 65–99)

## 2016-02-22 MED ORDER — ENOXAPARIN SODIUM 40 MG/0.4ML ~~LOC~~ SOLN
40.0000 mg | SUBCUTANEOUS | Status: DC
  Filled 2016-02-22 (×2): qty 0.4

## 2016-02-22 MED ORDER — ATORVASTATIN CALCIUM 80 MG PO TABS
80.0000 mg | ORAL_TABLET | Freq: Every day | ORAL | Status: DC
  Administered 2016-02-22 – 2016-02-23 (×2): 80 mg via ORAL
  Filled 2016-02-22 (×3): qty 1

## 2016-02-22 MED ORDER — ASPIRIN 81 MG PO CHEW
81.0000 mg | CHEWABLE_TABLET | Freq: Every day | ORAL | Status: DC
  Administered 2016-02-22 – 2016-02-23 (×2): 81 mg via ORAL
  Filled 2016-02-22 (×2): qty 1

## 2016-02-22 MED ORDER — CLOPIDOGREL BISULFATE 75 MG PO TABS
75.0000 mg | ORAL_TABLET | Freq: Every day | ORAL | Status: DC
  Administered 2016-02-22 – 2016-02-23 (×2): 75 mg via ORAL
  Filled 2016-02-22 (×2): qty 1

## 2016-02-22 MED ORDER — ATENOLOL 25 MG PO TABS
12.5000 mg | ORAL_TABLET | Freq: Two times a day (BID) | ORAL | Status: DC
Start: 2016-02-22 — End: 2016-02-23
  Administered 2016-02-22 – 2016-02-23 (×2): 12.5 mg via ORAL
  Filled 2016-02-22 (×3): qty 1

## 2016-02-22 NOTE — Progress Notes (Signed)
Removed left epicardial pacing wire without issue. Attempted twice to remove right atrial/venticle wires and met resistance both times. Called PA Donielle to come and remove. Patient instructed to remain in bed for one hour and frequent vitals to be taken. Pt resting with call bell within reach.  Will continue to monitor. Payton Emerald, RN

## 2016-02-22 NOTE — Discharge Instructions (Signed)
Coronary Artery Bypass Grafting, Care After °Refer to this sheet in the next few weeks. These instructions provide you with information on caring for yourself after your procedure. Your health care provider may also give you more specific instructions. Your treatment has been planned according to current medical practices, but problems sometimes occur. Call your health care provider if you have any problems or questions after your procedure. °WHAT TO EXPECT AFTER THE PROCEDURE °Recovery from surgery will be different for everyone. Some people feel well after 3 or 4 weeks, while for others it takes longer. After your procedure, it is typical to have the following: °· Nausea and a lack of appetite.   °· Constipation. °· Weakness and fatigue.   °· Depression or irritability.   °· Pain or discomfort at your incision site. °HOME CARE INSTRUCTIONS °· Take medicines only as directed by your health care provider. Do not stop taking medicines or start any new medicines without first checking with your health care provider. °· Take your pulse as directed by your health care provider. °· Perform deep breathing as directed by your health care provider. If you were given a device called an incentive spirometer, use it to practice deep breathing several times a day. Support your chest with a pillow or your arms when you take deep breaths or cough. °· Keep incision areas clean, dry, and protected. Remove or change any bandages (dressings) only as directed by your health care provider. You may have skin adhesive strips over the incision areas. Do not take the strips off. They will fall off on their own. °· Check incision areas daily for any swelling, redness, or drainage. °· If incisions were made in your legs, do the following: °¨ Avoid crossing your legs.   °¨ Avoid sitting for long periods of time. Change positions every 30 minutes.   °¨ Elevate your legs when you are sitting. °· Wear compression stockings as directed by your  health care provider. These stockings help keep blood clots from forming in your legs. °· Take showers once your health care provider approves. Until then, only take sponge baths. Pat incisions dry. Do not rub incisions with a washcloth or towel. Do not take baths, swim, or use a hot tub until your health care provider approves. °· Eat foods that are high in fiber, such as raw fruits and vegetables, whole grains, beans, and nuts. Meats should be lean cut. Avoid canned, processed, and fried foods. °· Drink enough fluid to keep your urine clear or pale yellow. °· Weigh yourself every day. This helps identify if you are retaining fluid that may make your heart and lungs work harder. °· Rest and limit activity as directed by your health care provider. You may be instructed to: °¨ Stop any activity at once if you have chest pain, shortness of breath, irregular heartbeats, or dizziness. Get help right away if you have any of these symptoms. °¨ Move around frequently for short periods or take short walks as directed by your health care provider. Increase your activities gradually. You may need physical therapy or cardiac rehabilitation to help strengthen your muscles and build your endurance. °¨ Avoid lifting, pushing, or pulling anything heavier than 10 lb (4.5 kg) for at least 6 weeks after surgery. °· Do not drive until your health care provider approves.  °· Ask your health care provider when you may return to work. °· Ask your health care provider when you may resume sexual activity. °· Keep all follow-up visits as directed by your health care   provider. This is important. °SEEK MEDICAL CARE IF: °· You have swelling, redness, increasing pain, or drainage at the site of an incision. °· You have a fever. °· You have swelling in your ankles or legs. °· You have pain in your legs.   °· You gain 2 or more pounds (0.9 kg) a day. °· You are nauseous or vomit. °· You have diarrhea.  °SEEK IMMEDIATE MEDICAL CARE IF: °· You have  chest pain that goes to your jaw or arms. °· You have shortness of breath.   °· You have a fast or irregular heartbeat.   °· You notice a "clicking" in your breastbone (sternum) when you move.   °· You have numbness or weakness in your arms or legs. °· You feel dizzy or light-headed.   °MAKE SURE YOU: °· Understand these instructions. °· Will watch your condition. °· Will get help right away if you are not doing well or get worse. °  °This information is not intended to replace advice given to you by your health care provider. Make sure you discuss any questions you have with your health care provider. °  °Document Released: 05/27/2005 Document Revised: 11/28/2014 Document Reviewed: 04/16/2013 °Elsevier Interactive Patient Education ©2016 [employer] ° °

## 2016-02-22 NOTE — Care Management Important Message (Signed)
Important Message  Patient Details  Name: Hailey Wright MRN: [id number] Date of Birth: [birth year]   Medicare Important Message Given:  Yes    Nathen May 02/22/2016, 12:03 PM

## 2016-02-22 NOTE — Progress Notes (Addendum)
      [address]       [place] [address]             [phone]        4 Days Post-Op Procedure(s) (LRB): CORONARY ARTERY BYPASS GRAFTING (CABG) times 2 using left internal mammary and right greater saphenous vein (N/A)  Subjective: Patient without specific complaints this am  Objective: Vital signs in last 24 hours: Temp:  [97.6 F (36.4 C)-98.6 F (37 C)] 98.4 F (36.9 C) (04/03 0451) Pulse Rate:  [69-83] 83 (04/03 0451) Cardiac Rhythm:  [-] Normal sinus rhythm (04/02 1915) Resp:  [15-26] 16 (04/03 0451) BP: (82-103)/(44-74) 97/54 mmHg (04/03 0451) SpO2:  [87 %-99 %] 91 % (04/03 0451) Weight:  [176 lb 4.8 oz (79.969 kg)] 176 lb 4.8 oz (79.969 kg) (04/03 0451)  Pre op weight 74 kg Current Weight  02/22/16 176 lb 4.8 oz (79.969 kg)      Intake/Output from previous day: 04/02 0701 - 04/03 0700 In: 720 [P.O.:720] Out: 1175 [Urine:1175]   Physical Exam:  Cardiovascular: RRR Pulmonary: Slightly diminished at bases; no rales, wheezes, or rhonchi. Abdomen: Soft, non tender, bowel sounds present. Extremities: Mild bilateral lower extremity edema. Wounds: Aquacel removed. Sternal wound is clean and dry.  No erythema or signs of infection.  Lab Results: CBC: Recent Labs  02/21/16 0511 02/22/16 0302  WBC 11.5* 7.4  HGB 9.4* 9.2*  HCT 28.2* 27.3*  PLT 116* 127*   BMET:  Recent Labs  02/21/16 0511 02/22/16 0302  NA 137 139  K 4.0 4.1  CL 105 107  CO2 26 26  GLUCOSE 137* 99  BUN 17 16  CREATININE 0.65 0.67  CALCIUM 8.3* 8.0*    PT/INR:  Lab Results  Component Value Date   INR 1.35 02/19/2016   INR 1.22 02/18/2016   INR 0.93 02/17/2016   ABG:  INR: Will add last result for INR, ABG once components are confirmed Will add last 4 CBG results once components are confirmed  Assessment/Plan:  1. CV - S/p STEMI. SR in the 80's. On Atenolol 12.5 mg bid. BP has been labile so unable to start ACE/ARB. 2.  Pulmonary - On room air.  CXR this am appears stable (left base atelectasis, small bilateral pleural effusions). Encourage incentive spirometer. 3. Volume Overload - On Lasix 20 mg IV bid 4.  Acute blood loss anemia - H and H stable at 9.2 and 27.3 5. Mild thrombocytopenia-platelets up to 127,000 6. DM-CBGs 108/122/84. On Metformin 500 mg at dinner. 7. 7. Remove EPW 8. Possible discharge in am  ZIMMERMAN,DONIELLE MPA-C 02/22/2016,7:33 AM   I have seen and examined the patient and agree with the assessment and plan as outlined.  Initiate DAPT due to presentation w/ acute STEMI.  BP probably still too low to add ACE-I.  Needs early f/u with Cardiology post discharge.   Rexene Alberts, MD 02/22/2016 7:52 AM

## 2016-02-22 NOTE — Progress Notes (Signed)
CARDIAC REHAB PHASE I   PRE:  Rate/Rhythm: 91 SR  BP:  Supine: 96/42  Sitting:   Standing:    SaO2: 92%RA  MODE:  Ambulation: 500 ft   POST:  Rate/Rhythm: 105 ST  BP:  Supine:   Sitting: 99/58  Standing:    SaO2: 93%RA 0900-0930 Pt walked 500 ft with steady gait and hand held asst. Slightly SOB but did not need to stop to rest. To recliner after walk. Wrote down how to view discharge video. No c/o dizziness.   Graylon Good, RN BSN  02/22/2016 9:26 AM

## 2016-02-22 NOTE — Discharge Summary (Signed)
Physician Discharge Summary       [address]       [place], [address]             [phone]    Patient ID: Hailey Wright MRN: [id number] DOB/AGE: [birth year] 70 y.o.  Admit date: 02/18/2016 Discharge date: 02/23/2016  Admission Diagnoses: 1. Unstable angina 2. S/p STEMI ([place]) 3. CAD  Active Diagnoses:  1. Hyperlipidemia 2. MI (HCC, inferior, treated with TPA 91', POBA RCA 96') 3. RAS  RAS (renal artery stenosis) (HCC, s/p right renal stent 05') 4. History of tobacco abuse 5. ABL anemia 6. Mild thrombocytopenia  Procedure (s):  Left Heart Cath and Coronary Angiography by Dr. Ellyn Hack on 02/18/2016:    Conclusion     Ost LM to LM lesion, 60% stenosed.  Ost LAD-1 lesion, 99% stenosed. Ost LAD-2 lesion, 70% stenosed. Mid LAD lesion, 40% stenosed. Dist LAD lesion, 40% stenosed.  Prox Cx lesion, 95% stenosed - prior to OM1. Mid Cx lesion, 70% stenosed just prior to OM 2 takeoff  Ost RCA to Dist RCA lesion, 100% stenosed. PDA and posterolateral branch are filled via left-to-right collaterals  There is severe left ventricular systolic dysfunction. Elevated LVEDP     Emergency Coronary Artery Bypass Grafting x 2 Left Internal Mammary Artery to Distal Left Anterior Descending Coronary Artery Saphenous Vein Graft to First Obtuse Marginal Branch of Left Circumflex Coronary Artery Endoscopic Vein Harvest from Right Thigh by Dr. Roxy Manns on 02/18/2016.  History of Presenting Illness: The patient is a 71 year old female with history of coronary artery disease status post acute inferior wall myocardial infarction complicated by ventricular fibrillation arrest in the remote past, hyperlipidemia, and borderline type 2 diabetes mellitus who is referred for emergent cardiac surgical consultation secondary to left main disease with critical three-vessel coronary artery disease and acute evolving ST segment elevation myocardial infarction  complicated by cardiogenic shock. The patient suffered an acute myocardial infarction in 1991 that Presented with VF arrest. She was treated with TPA. In 1996 diagnostic cardiac catheterization was performed revealing multivessel coronary artery disease including 90% stenosis of mid right coronary artery. She was treated with balloon angioplasty. She has been followed for many years by Dr. Percival Spanish who saw her most recently in October 2016. At that time she was doing well.  Over the past week the patient has had accelerating symptoms of angina pectoris. She had 2 episodes of nocturnal chest pain that awoke her from her sleep. She took sublingual nitroglycerin with some relief. The pain was quite severe and prompted her to call the cardiology office for an appointment. She was seen in the office yesterday by Dr. Oval Linsey. Elective diagnostic cardiac catheterization was scheduled for tomorrow.   Late this afternoon the patient developed severe substernal chest pain at rest that persisted despite nitroglycerin. She took a total of 3 nitroglycerin and 4 baby aspirin without any relief. EMS was called. She became nauseated and vomited en route. She was hypotensive and resuscitated using volume. EKG revealed acute ST segment elevation. Code STEMI was activated and the patient was brought rectally to the Cath Lab from the emergency department. Diagnostic cardiac catheterization performed by Dr. Ellyn Hack demonstrates left main disease with critical subtotal ostial occlusion of the left anterior descending coronary artery with acute thrombus. There is acute thrombus in the left circumflex coronary artery. There is chronic occlusion of the right coronary artery with left-to-right collateral filling of the terminal branches of the right coronary artery. There is severe hypokinesis  or akinesis of the entire distal anterior wall and apex. The patient is hypertensive in her minimally in the Cath Lab. Emergency cardiac  surgical consultation was requested and an intra-aortic balloon pump was placed from the right femoral approach.  Upon my arrival following intra-aortic balloon pump the patient reports slight improvement in chest pain. She is breathing comfortably on nasal cannula oxygen. She states that prior to the onset of symptoms of angina approximately one week ago she was otherwise in her usual state of good health. She is married and lives with her husband locally in [place]. She has been retired for nearly 20 years, having previously been in the [employer]. She lives active lifestyle and exercises regularly. She reports no significant limitations up until recently. She specifically denies any history of shortness breath, orthopnea, PND, or lower extremity edema prior to the development of her acute onset of chest pain this week.  Dr. Roxy Manns discussed the indications, risks, and potential benefits of emergency coronary artery bypass grafting with the patient in the cath lab and with her husband and family in the waiting area. Alternative treatment strategies were discussed. In particular, concerns regarding risks associated with PCI and stenting have been expressed. They understand the emergent circumstances and the need to proceed with surgery immediately. They understand and accept all potential associated risks of surgery. She underwent an emergent CABG x 2 on 02/18/2016.  Brief Hospital Course:  The patient was extubated the morning of post operative day one without difficulty. She remained afebrile and hemodynamically stable. She was weaned off of Milrinone drip. Gordy Councilman, a line, chest tubes, and foley were removed early in the post operative course. IABP and radial artery sheath were removed on 02/19/2016.Atenolol was restarted as taken pre op. She was volume over loaded and diuresed. She had ABL anemia. She did not require a post op transfusion. Her last H and H was at 9.2 and 27.3. She was  weaned off the insulin drip. She was restarted on Metformin. Her glucose remained well controlled.  The patient was felt surgically stable for transfer from the ICU to [facility] for further convalescence on 02/21/2016. She continues to progress with cardiac rehab. She was ambulating on room air. She has been tolerating a diet and has had a bowel movement. Epicardial pacing wires were removed on 02/22/2016. Chest tube sutures will be removed prior to discharge. The patient is felt surgically stable for discharge today.   Latest Vital Signs: Blood pressure 93/43, pulse 77, temperature 97.9 F (36.6 C), temperature source Oral, resp. rate 18, height 5\' 6"  (1.676 m), weight 173 lb (78.472 kg), SpO2 95 %.  Physical Exam: Cardiovascular: RRR Pulmonary: Slightly diminished at bases; no rales, wheezes, or rhonchi. Abdomen: Soft, non tender, bowel sounds present. Extremities: Mild bilateral lower extremity edema. Wounds: Aquacel removed. Sternal wound is clean and dry. No erythema or signs of infection.  Discharge Condition:Stable and discharged to home  Recent laboratory studies:  Lab Results  Component Value Date   WBC 7.4 02/22/2016   HGB 9.2* 02/22/2016   HCT 27.3* 02/22/2016   MCV 90.4 02/22/2016   PLT 127* 02/22/2016   Lab Results  Component Value Date   NA 139 02/22/2016   K 4.1 02/22/2016   CL 107 02/22/2016   CO2 26 02/22/2016   CREATININE 0.67 02/22/2016   GLUCOSE 99 02/22/2016   Diagnostic Studies: Dg Chest 2 View  02/22/2016  CLINICAL DATA:  Unstable angina and acute ST-elevation myocardial infarction. Postop  from CABG. Atelectasis. EXAM: CHEST  2 VIEW COMPARISON:  02/21/2016 FINDINGS: Decreased atelectasis or infiltrate is seen in the left retrocardiac lung base since prior study. Probable mild basilar prominent interstitial edema and small bilateral pleural effusions persist. Heart size is within normal limits. Prior CABG again noted. Right jugular Cordis is been removed. No  pneumothorax visualized. IMPRESSION: Decreased left retrocardiac atelectasis versus infiltrate. Mild residual interstitial edema and small bilateral pleural effusions. Electronically Signed   By: Earle Gell M.D.   On: 02/22/2016 07:42   Discharge Medications:   Medication List    STOP taking these medications        isosorbide mononitrate 60 MG 24 hr tablet  Commonly known as:  IMDUR     nitroGLYCERIN 0.4 MG SL tablet  Commonly known as:  NITROSTAT      TAKE these medications        aspirin 81 MG tablet  Take 81 mg by mouth daily.     atenolol 25 MG tablet  Commonly known as:  TENORMIN  Take 0.5 tablets (12.5 mg total) by mouth 2 (two) times daily.     atorvastatin 80 MG tablet  Commonly known as:  LIPITOR  Take 80 mg by mouth daily.     calcium-vitamin D 500-200 MG-UNIT tablet  Commonly known as:  OSCAL WITH D  Take 1 tablet by mouth 2 (two) times daily.     clopidogrel 75 MG tablet  Commonly known as:  PLAVIX  Take 1 tablet (75 mg total) by mouth daily.     Co-Enzyme Q10 200 MG Caps  Take 1 capsule by mouth daily.     furosemide 40 MG tablet  Commonly known as:  LASIX  Take 1 tablet (40 mg total) by mouth daily. For 5 days then stop.     Magnesium 500 MG Tabs  Take 1 tablet by mouth daily.     metFORMIN 500 MG 24 hr tablet  Commonly known as:  GLUCOPHAGE-XR  Take 1 tablet by mouth every evening.     multivitamin tablet  Take 1 tablet by mouth daily.     Potassium Chloride ER 20 MEQ Tbcr  Take 20 mEq by mouth daily. For 5 days then stop.     traMADol 50 MG tablet  Commonly known as:  ULTRAM  Take 1-2 tablets (50-100 mg total) by mouth every 4 (four) hours as needed for moderate pain.       The patient has been discharged on:   1.Beta Blocker:  Yes [  x ]                              No   [   ]                              If No, reason:  2.Ace Inhibitor/ARB: Yes [   ]                                     No  [  x  ]                                      If No, reason:Labile BP  3.Statin:  Yes [x   ]                  No  [   ]                  If No, reason:  4.Ecasa:  Yes  [ x  ]                  No   [   ]                  If No, reason:  Follow Up Appointments: Follow-up Information    Follow up with Rexene Alberts, MD On 03/28/2016.   Specialty:  Cardiothoracic Surgery   Why:  PA/LAT CXR to be taken (at [facility] which is in the same building as Dr. Guy Sandifer office) on 03/28/2016 at 3:15 pm;Appointment time is at 4:00 pm   Contact information:   [place] [address] [phone]       Follow up with Richardson Dopp, PA-C On 03/17/2016.   Specialties:  Physician Assistant, Radiology, Interventional Cardiology   Why:  Appointment time is at 2:00 pm   Contact information:   [id number] [place] [address] [phone]       Signed: Lars Pinks MPA-C 02/23/2016, 10:28 AM

## 2016-02-23 LAB — GLUCOSE, CAPILLARY: Glucose-Capillary: 104 mg/dL — ABNORMAL HIGH (ref 65–99)

## 2016-02-23 MED ORDER — FUROSEMIDE 40 MG PO TABS: 40.0000 mg | ORAL_TABLET | Freq: Every day | ORAL | Status: DC

## 2016-02-23 MED ORDER — CLOPIDOGREL BISULFATE 75 MG PO TABS: 75.0000 mg | ORAL_TABLET | Freq: Every day | ORAL | Status: DC

## 2016-02-23 MED ORDER — ATENOLOL 25 MG PO TABS: 12.5000 mg | ORAL_TABLET | Freq: Two times a day (BID) | ORAL | Status: DC

## 2016-02-23 MED ORDER — POTASSIUM CHLORIDE ER 20 MEQ PO TBCR: 20.0000 meq | EXTENDED_RELEASE_TABLET | Freq: Every day | ORAL | Status: DC

## 2016-02-23 MED ORDER — TRAMADOL HCL 50 MG PO TABS: 50.0000 mg | ORAL_TABLET | ORAL | Status: DC | PRN

## 2016-02-23 NOTE — Progress Notes (Addendum)
      [address]       [place] [address]             [phone]        5 Days Post-Op Procedure(s) (LRB): CORONARY ARTERY BYPASS GRAFTING (CABG) times 2 using left internal mammary and right greater saphenous vein (N/A)  Subjective: Patient without specific complaints this am. She is a little scared about going home.  Objective: Vital signs in last 24 hours: Temp:  [97.9 F (36.6 C)-98.8 F (37.1 C)] 97.9 F (36.6 C) (04/04 0643) Pulse Rate:  [77-92] 77 (04/04 0643) Cardiac Rhythm:  [-] Normal sinus rhythm (04/03 1924) Resp:  [18-20] 18 (04/04 0643) BP: (86-97)/(39-56) 96/49 mmHg (04/04 0643) SpO2:  [95 %-98 %] 95 % (04/04 0643) Weight:  [173 lb (78.472 kg)] 173 lb (78.472 kg) (04/04 0643)  Pre op weight 74 kg Current Weight  02/23/16 173 lb (78.472 kg)      Intake/Output from previous day: 04/03 0701 - 04/04 0700 In: 1080 [P.O.:1080] Out: 2900 [Urine:2900]   Physical Exam:  Cardiovascular: RRR Pulmonary: Slightly diminished at bases; no rales, wheezes, or rhonchi. Abdomen: Soft, non tender, bowel sounds present. Extremities: Mild bilateral lower extremity edema. Wounds: Aquacel removed. Sternal wound is clean and dry.  No erythema or signs of infection.  Lab Results: CBC:  Recent Labs  02/21/16 0511 02/22/16 0302  WBC 11.5* 7.4  HGB 9.4* 9.2*  HCT 28.2* 27.3*  PLT 116* 127*   BMET:   Recent Labs  02/21/16 0511 02/22/16 0302  NA 137 139  K 4.0 4.1  CL 105 107  CO2 26 26  GLUCOSE 137* 99  BUN 17 16  CREATININE 0.65 0.67  CALCIUM 8.3* 8.0*    PT/INR:  Lab Results  Component Value Date   INR 1.35 02/19/2016   INR 1.22 02/18/2016   INR 0.93 02/17/2016   ABG:  INR: Will add last result for INR, ABG once components are confirmed Will add last 4 CBG results once components are confirmed  Assessment/Plan:  1. CV - S/p STEMI. SR in the 80's. On Atenolol 12.5 mg bid. BP has been labile so unable to start ACE/ARB.  Atenolol yesterday am held but given night dose. 2.  Pulmonary - On room air.  Encourage incentive spirometer. 3. Volume Overload - On Lasix 20 mg IV bid 4.  Acute blood loss anemia - H and H stable at 9.2 and 27.3 5. Mild thrombocytopenia-platelets up to 127,000 6. DM-CBGs 78/107/104. On Metformin 500 mg at dinner. 7.  7. Hope to discharge but will discuss with Dr. Eddie Candle MPA-C 02/23/2016,7:29 AM   I have seen and examined the patient and agree with the assessment and plan as outlined.  Doing well.  D/C home today.  Rexene Alberts, MD 02/23/2016 10:22 AM

## 2016-02-23 NOTE — Progress Notes (Signed)
Order received to discharge.  Telemetry removed and CCMD notified.  IV removed with catheter intact.  Chest tube sutures removed and steristrips applied.  Pt tol well.  Pt discharge education given.  All questions answered.  Pt denies chest pain or sob at this time.  Stable at discharge.

## 2016-02-23 NOTE — Progress Notes (Signed)
[id number] Education completed with pt who voiced understanding. Encouraged IS. Discussed CRP 2 and referring to [facility] program. Has seen discharge video. Gave heart healthy diet. Walked independently this morning with walker. Graylon Good RN BSN 02/23/2016 9:50 AM

## 2016-02-23 NOTE — Care Management Note (Signed)
Case Management Note Previous CM note initiated by Elenor Quinones RN, CM  Patient Details  Name: Hailey Wright MRN: [id number] Date of Birth: 07-11-1946  Subjective/Objective:           Pt is s/p Procedure:   Emergency Coronary Artery Bypass Grafting x 2 Left Internal Mammary Artery to Distal Left Anterior Descending Coronary Artery Saphenous Vein Graft to First Obtuse Marginal Branch of Left Circumflex Coronary Artery Endoscopic Vein Harvest from Right Thigh         Action/Plan:  PTA - independent from home with husband, husband will provide 24 hour supervision.  CM will continue to monitor for disposition needs   Expected Discharge Date:     02/23/16             Expected Discharge Plan:  Home/Self Care  In-House Referral:     Discharge planning Services  CM Consult  Post Acute Care Choice:  NA Choice offered to:  NA  DME Arranged:    DME Agency:     HH Arranged:    HH Agency:     Status of Service:  Completed, signed off  Medicare Important Message Given:  Yes Date Medicare IM Given:    Medicare IM give by:    Date Additional Medicare IM Given:    Additional Medicare Important Message give by:     If discussed at [place] of Stay Meetings, dates discussed:    Discharge Disposition: home/self care   Additional Comments:  Dawayne Patricia, RN 02/23/2016, 10:38 AM

## 2016-03-14 ENCOUNTER — Ambulatory Visit: Payer: Medicare Other | Admitting: Physician Assistant

## 2016-03-15 ENCOUNTER — Telehealth: Payer: Self-pay | Admitting: Cardiology

## 2016-03-15 NOTE — Telephone Encounter (Signed)
Leave message letting pt know she should keep appt with scott weaver, eph f/u

## 2016-03-15 NOTE — Telephone Encounter (Signed)
New Message  Pt wanted to know if f/u sched for 4/27 w/ Nicki Reaper is necessary or if she should wait and see Dr Percival Spanish. Please call back and discuss.

## 2016-03-17 ENCOUNTER — Ambulatory Visit
Admission: RE | Admit: 2016-03-17 | Discharge: 2016-03-17 | Disposition: A | Discharge status: Home / Self Care | Payer: Medicare Other | Source: Ambulatory Visit | Attending: Physician Assistant | Admitting: Physician Assistant

## 2016-03-17 ENCOUNTER — Encounter: Payer: Self-pay | Admitting: Physician Assistant

## 2016-03-17 ENCOUNTER — Ambulatory Visit (INDEPENDENT_AMBULATORY_CARE_PROVIDER_SITE_OTHER): Payer: Medicare Other | Admitting: Physician Assistant

## 2016-03-17 ENCOUNTER — Other Ambulatory Visit: Payer: Self-pay | Admitting: Thoracic Surgery (Cardiothoracic Vascular Surgery)

## 2016-03-17 VITALS — BP 98/58 | HR 80 | Ht 66.0 in | Wt 155.1 lb

## 2016-03-17 DIAGNOSIS — I5022 Chronic systolic (congestive) heart failure: Secondary | ICD-10-CM

## 2016-03-17 DIAGNOSIS — I2510 Atherosclerotic heart disease of native coronary artery without angina pectoris: Secondary | ICD-10-CM

## 2016-03-17 DIAGNOSIS — R05 Cough: Secondary | ICD-10-CM

## 2016-03-17 DIAGNOSIS — E785 Hyperlipidemia, unspecified: Secondary | ICD-10-CM

## 2016-03-17 DIAGNOSIS — I255 Ischemic cardiomyopathy: Secondary | ICD-10-CM | POA: Insufficient documentation

## 2016-03-17 DIAGNOSIS — I200 Unstable angina: Secondary | ICD-10-CM

## 2016-03-17 DIAGNOSIS — R059 Cough, unspecified: Secondary | ICD-10-CM

## 2016-03-17 DIAGNOSIS — Z951 Presence of aortocoronary bypass graft: Secondary | ICD-10-CM

## 2016-03-17 LAB — CBC WITH DIFFERENTIAL/PLATELET
BASOS PCT: 1 %
Basophils Absolute: 82 cells/uL (ref 0–200)
EOS ABS: 246 {cells}/uL (ref 15–500)
Eosinophils Relative: 3 %
HEMATOCRIT: 36.8 % (ref 35.0–45.0)
Hemoglobin: 12.1 g/dL (ref 11.7–15.5)
LYMPHS ABS: 2952 {cells}/uL (ref 850–3900)
Lymphocytes Relative: 36 %
MCH: 28.9 pg (ref 27.0–33.0)
MCHC: 32.9 g/dL (ref 32.0–36.0)
MCV: 87.8 fL (ref 80.0–100.0)
MONO ABS: 820 {cells}/uL (ref 200–950)
MPV: 9.6 fL (ref 7.5–12.5)
Monocytes Relative: 10 %
NEUTROS ABS: 4100 {cells}/uL (ref 1500–7800)
Neutrophils Relative %: 50 %
PLATELETS: 321 10*3/uL (ref 140–400)
RBC: 4.19 MIL/uL (ref 3.80–5.10)
RDW: 15 % (ref 11.0–15.0)
WBC: 8.2 10*3/uL (ref 3.8–10.8)

## 2016-03-17 LAB — BASIC METABOLIC PANEL
BUN: 16 mg/dL (ref 7–25)
CHLORIDE: 104 mmol/L (ref 98–110)
CO2: 26 mmol/L (ref 20–31)
Calcium: 9.8 mg/dL (ref 8.6–10.4)
Creat: 0.91 mg/dL (ref 0.50–0.99)
Glucose, Bld: 105 mg/dL — ABNORMAL HIGH (ref 65–99)
POTASSIUM: 4.6 mmol/L (ref 3.5–5.3)
Sodium: 139 mmol/L (ref 135–146)

## 2016-03-17 NOTE — Progress Notes (Signed)
Cardiology Office Note:    Date:  03/17/2016   ID:  Hailey Wright, DOB 09-26-1946, MRN [id number]  PCP:  Donnajean Lopes, MD  Cardiologist:  Dr. Minus Breeding   Electrophysiologist:  N/a   Referring MD: Leanna Battles, MD   Chief Complaint  Patient presents with  . Hospitalization Follow-up    STEMI 3/30-4/4 >> CABG    History of Present Illness:     Hailey Wright is a 70 y.o. female with a hx of CAD status post myocardial infarction in 1991 treated with TPA. She initially presented with VF arrest. LHC in 1996 demonstrated mild stenosis in the mid LAD, LCx 50-60%, RCA 50% and mid 90% treated with angioplasty. Other history includes renal artery stenosis status post R RA stent in 2005, hyperlipidemia, diabetes. She was evaluated by Dr. Oval Linsey 02/17/16 with symptoms concerning for angina. Catheterization was arranged. However, she presented to the hospital on 02/18/16 with lateral STEMI. She took several nitroglycerin with resultant hypotension. Emergent catheterization demonstrated 3 vessel CAD with left main disease, subtotal occlusion of the LAD with acute thrombus and acute thrombus in the LCx as well as chronic occlusion of the RCA. LVEDP was elevated and her EF was 25-35%. She was placed on IABP. She was seen by Dr. Roxy Manns and was taken for emergent CABG (LIMA-LAD, SVG-OM1). Postoperative course was fairly uneventful.   Returns for FU.  Here with her husband.  Doing well.  Walking daily without chest pain or dyspnea.  Feels congested today.  Has some L sided tightness and slight cough.  No fever.  Denies recurrent angina.  No orthopnea, PND, edema.  No syncope. Appetite good.  [facility] called her already.  Sees Dr. Roxy Manns next week.    Past Medical History  Diagnosis Date  . Hyperlipidemia   . MI (myocardial infarction) ([place])     Inferior treated with TPA 1991, POBA RCA 1996  . RAS (renal artery stenosis) ([place])     Right renal stent 2005  . Unstable angina ([place]) 02/17/2016  . CAD  (coronary artery disease) 06/04/2013  . Acute ST elevation myocardial infarction (STEMI) involving left anterior descending coronary artery ([place]) 02/18/2016  . Acute ST elevation myocardial infarction (STEMI) involving left circumflex coronary artery ([place]) 02/18/2016  . Coronary artery thrombosis ([place]) 02/18/2016  . S/P Emergency CABG x 2 02/18/2016    LIMA to LAD, SVG to OM1, EVH via right thigh    Past Surgical History  Procedure Laterality Date  . Rhinoplasty    . Tonsillectomy and adenoidectomy    . Cardiac catheterization N/A 02/18/2016    Procedure: Left Heart Cath and Coronary Angiography;  Surgeon: Leonie Man, MD;  Location: [facility];  Service: Cardiovascular;  Laterality: N/A;  . Coronary artery bypass graft N/A 02/18/2016    Procedure: CORONARY ARTERY BYPASS GRAFTING (CABG) times 2 using left internal mammary and right greater saphenous vein;  Surgeon: Rexene Alberts, MD;  Location: [facility];  Service: Open Heart Surgery;  Laterality: N/A;    Current Medications: Outpatient Prescriptions Prior to Visit  Medication Sig Dispense Refill  . aspirin 81 MG tablet Take 81 mg by mouth daily.    Marland Kitchen atenolol (TENORMIN) 25 MG tablet Take 0.5 tablets (12.5 mg total) by mouth 2 (two) times daily.    Marland Kitchen atorvastatin (LIPITOR) 80 MG tablet Take 80 mg by mouth daily.    . calcium-vitamin D (OSCAL WITH D) 500-200 MG-UNIT per tablet Take 1 tablet by mouth 2 (two) times daily.    Marland Kitchen  clopidogrel (PLAVIX) 75 MG tablet Take 1 tablet (75 mg total) by mouth daily. 30 tablet 1  . Co-Enzyme Q10 200 MG CAPS Take 1 capsule by mouth daily.     . Magnesium 500 MG TABS Take 1 tablet by mouth daily.     . metFORMIN (GLUCOPHAGE-XR) 500 MG 24 hr tablet Take 1 tablet by mouth every evening.     . Multiple Vitamin (MULTIVITAMIN) tablet Take 1 tablet by mouth daily.    . traMADol (ULTRAM) 50 MG tablet Take 1-2 tablets (50-100 mg total) by mouth every 4 (four) hours as needed for moderate pain. 30 tablet 0  .  furosemide (LASIX) 40 MG tablet Take 1 tablet (40 mg total) by mouth daily. For 5 days then stop. (Patient not taking: Reported on 03/17/2016) 5 tablet 0  . potassium chloride 20 MEQ TBCR Take 20 mEq by mouth daily. For 5 days then stop. (Patient not taking: Reported on 03/17/2016) 5 tablet 0   No facility-administered medications prior to visit.      Allergies:   Review of patient's allergies indicates no known allergies.   Social History   Social History  . Marital Status: Married    Spouse Name: N/A  . Number of Children: 1  . Years of Education: N/A   Social History Main Topics  . Smoking status: Former Smoker -- 1.00 packs/day for 40 years    Types: Cigarettes    Quit date: 06/05/2011  . Smokeless tobacco: Former Systems developer  . Alcohol Use: No  . Drug Use: No  . Sexual Activity: Not Asked   Other Topics Concern  . None   Social History Narrative   Lives with husband.     Family History:  The patient's family history includes Heart attack (age of onset: 58) in her brother; Heart attack (age of onset: 11) in her father.   ROS:   Please see the history of present illness.    Review of Systems  Cardiovascular: Positive for dyspnea on exertion.  Respiratory: Positive for cough.    All other systems reviewed and are negative.   Physical Exam:    VS:  BP 98/58 mmHg  Pulse 80  Ht 5\' 6"  (1.676 m)  Wt 155 lb 1.9 oz (70.362 kg)  BMI 25.05 kg/m2  LMP  (LMP Unknown)   GEN: Well nourished, well developed, in no acute distress HEENT: normal Neck: no JVD, no masses Cardiac: Normal S1/S2, RRR; no murmurs, rubs, or gallops, no edema;   Chest - median sternotomy well healed, no erythema or d/c   Respiratory:  clear to auscultation bilaterally; no wheezing, rhonchi or rales GI: soft, nontender, nondistended MS: no deformity or atrophy Skin: warm and dry Neuro: No focal deficits  Psych: Alert and oriented x 3, normal affect  Wt Readings from Last 3 Encounters:  03/17/16 155 lb  1.9 oz (70.362 kg)  02/23/16 173 lb (78.472 kg)  02/17/16 167 lb 6.4 oz (75.932 kg)      Studies/Labs Reviewed:     EKG:  EKG is  ordered today.  The ekg ordered today demonstrates NSR, HR 77, rightward axis, septal Q waves, TWI 2, V3-5, QTc 459 ms  Recent Labs: 02/18/2016: ALT 50 02/20/2016: Magnesium 2.1 02/22/2016: BUN 16; Creatinine, Ser 0.67; Hemoglobin 9.2*; Platelets 127*; Potassium 4.1; Sodium 139   Recent Lipid Panel No results found for: CHOL, TRIG, HDL, CHOLHDL, VLDL, LDLCALC, LDLDIRECT  Additional studies/ records that were reviewed today include:   LHC 02/18/16 LM ostial  60% LAD ostial 99%, 70%, mid 40%, distal 40% LCx proximal ulcerated 95% with heavy thrombus, mid 70% RCA ostial 100%-CTO EF 25-35% LVEDP 23  Myoview 10/16 EF 57%, normal perfusion, low risk  Echo 1996 EF 61%   ASSESSMENT:     1. Coronary artery disease involving native coronary artery of native heart without angina pectoris   2. Ischemic cardiomyopathy   3. Chronic systolic CHF (congestive heart failure) ([place])   4. Hyperlipidemia   5. Cough     PLAN:     In order of problems listed above:  1. CAD - s/p remote MI in 1991 and POBA to RCA in 1996.  Recently presented with Lat STEMI and subsequent emergent CABG.  She is doing well.  She is having some congestion today but otherwise has felt better each day. She sees Dr. Roxy Manns next week.  -  Continue ASA, Plavix, statin.   -  start Cardiac Rehab when able  2. Ischemic CM - BP too soft to change Atenolol to Coreg or add ACE inhibitor.  She needs an Echo to check her EF.  Will arrange.   3. Chronic systolic HF - NYHA 2.  Volume stable.  She is not on diuretics.  As noted, BP too soft to advance HF meds.  Obtain echo as noted.   4. HL - Continue high dose statin.  5. Cough - Lung exam ok.  May just be post surgical pain.  But, will get CXR, CBC, BMET.     Medication Adjustments/Labs and Tests Ordered: Current medicines are reviewed at  length with the patient today.  Concerns regarding medicines are outlined above.  Medication changes, Labs and Tests ordered today are outlined in the Patient Instructions noted below. Patient Instructions  Medication Instructions:  Your physician recommends that you continue on your current medications as directed. Please refer to the Current Medication list given to you today. Labwork: TODAY:  BMET & CBC Testing/Procedures: Your physician has requested that you have an echocardiogram. Echocardiography is a painless test that uses sound waves to create images of your heart. It provides your doctor with information about the size and shape of your heart and how well your heart's chambers and valves are working. This procedure takes approximately one hour. There are no restrictions for this procedure. A chest x-ray takes a picture of the organs and structures inside the chest, including the heart, lungs, and blood vessels. This test can show several things, including, whether the heart is enlarges; whether fluid is building up in the lungs; and whether pacemaker / defibrillator leads are still in place. Follow-Up: Your physician recommends that you schedule a follow-up appointment in: [place] DR. HOCHREIN Any Other Special Instructions Will Be Listed Below (If Applicable).If you need a refill on your cardiac medications before your next appointment, please call your pharmacy.     Signed, Richardson Dopp, PA-C  03/17/2016 2:36 PM    [facility] [address], [place], [place]  [address] Phone: [phone]; Fax: [phone]

## 2016-03-17 NOTE — Patient Instructions (Addendum)
Medication Instructions:  Your physician recommends that you continue on your current medications as directed. Please refer to the Current Medication list given to you today. Labwork: TODAY:  BMET & CBC Testing/Procedures: Your physician has requested that you have an echocardiogram. Echocardiography is a painless test that uses sound waves to create images of your heart. It provides your doctor with information about the size and shape of your heart and how well your heart's chambers and valves are working. This procedure takes approximately one hour. There are no restrictions for this procedure. A chest x-ray takes a picture of the organs and structures inside the chest, including the heart, lungs, and blood vessels. This test can show several things, including, whether the heart is enlarges; whether fluid is building up in the lungs; and whether pacemaker / defibrillator leads are still in place. Follow-Up: Your physician recommends that you schedule a follow-up appointment in: [place] DR. HOCHREIN Any Other Special Instructions Will Be Listed Below (If Applicable).  Echocardiogram An echocardiogram, or echocardiography, uses sound waves (ultrasound) to produce an image of your heart. The echocardiogram is simple, painless, obtained within a short period of time, and offers valuable information to your health care provider. The images from an echocardiogram can provide information such as:  Evidence of coronary artery disease (CAD).  Heart size.  Heart muscle function.  Heart valve function.  Aneurysm detection.  Evidence of a past heart attack.  Fluid buildup around the heart.  Heart muscle thickening.  Assess heart valve function. LET [facility] CARE PROVIDER KNOW ABOUT:  Any allergies you have.  All medicines you are taking, including vitamins, herbs, eye drops, creams, and over-the-counter medicines.  Previous problems you or members of your family have had with the  use of anesthetics.  Any blood disorders you have.  Previous surgeries you have had.  Medical conditions you have.  Possibility of pregnancy, if this applies. BEFORE THE PROCEDURE  No special preparation is needed. Eat and drink normally.  PROCEDURE  In order to produce an imagX-rays X-rays are tests that create pictures of the inside of your body using radiation. Different body parts absorb different amounts of radiation, which show up on the X-ray pictures in shades of black, gray, and white.  X-rays are used to look for many health conditions, including broken bones, lung problems, and some types of cancer.  LET [facility] CARE PROVIDER KNOW ABOUT: Any allergies you have. All medicines you are taking, including vitamins, herbs, eye drops, creams, and over-the-counter medicines. Previous surgeries you have had. Medical conditions you have. RISK AND COMPLICATIONS Getting an X-ray is a safe procedure.  BEFORE THE PROCEDURE Tell the X-ray technician if you are pregnant or might be pregnant. You may be asked to wear a protective lead apron to hide parts of your body from the X-ray. You usually will need to undress whatever part of your body needs the X-ray. You will be given a hospital gown to wear, if needed. You may need to remove your glasses, jewelry, and other metal objects. PROCEDURE  The X-ray machine creates a picture by using a tiny burst of radiation. It is painless. You may need to have several pictures taken at different angles. You will need to try to be as still as you can during the examination to get the best possible images. AFTER THE PROCEDURE You will be able to resume your normal activities. The X-ray will be examined by your health care provider or a radiology  specialist. It is your responsibility to get your test results. Ask your health care provider when to expect your results and how to get them.   This information is not intended to replace advice given  to you by your health care provider. Make sure you discuss any questions you have with your health care provider.   Document Released: 11/07/2005 Document Revised: 11/28/2014 Document Reviewed: 01/01/2014 Elsevier Interactive Patient Education [employer].  e of your heart, gel will be applied to your chest and a wand-like tool (transducer) will be moved over your chest. The gel will help transmit the sound waves from the transducer. The sound waves will harmlessly bounce off your heart to allow the heart images to be captured in real-time motion. These images will then be recorded.  You may need an IV to receive a medicine that improves the quality of the pictures. AFTER THE PROCEDURE You may return to your normal schedule including diet, activities, and medicines, unless your health care provider tells you otherwise.   This information is not intended to replace advice given to you by your health care provider. Make sure you discuss any questions you have with your health care provider.   Document Released: 11/04/2000 Document Revised: 11/28/2014 Document Reviewed: 07/15/2013 Elsevier Interactive Patient Education [employer].    If you need a refill on your cardiac medications before your next appointment, please call your pharmacy.

## 2016-03-21 ENCOUNTER — Ambulatory Visit (INDEPENDENT_AMBULATORY_CARE_PROVIDER_SITE_OTHER): Payer: Self-pay | Admitting: Thoracic Surgery (Cardiothoracic Vascular Surgery)

## 2016-03-21 ENCOUNTER — Encounter: Payer: Self-pay | Admitting: Thoracic Surgery (Cardiothoracic Vascular Surgery)

## 2016-03-21 ENCOUNTER — Ambulatory Visit
Admission: RE | Admit: 2016-03-21 | Discharge: 2016-03-21 | Disposition: A | Discharge status: Home / Self Care | Payer: Medicare Other | Source: Ambulatory Visit | Attending: Thoracic Surgery (Cardiothoracic Vascular Surgery) | Admitting: Thoracic Surgery (Cardiothoracic Vascular Surgery)

## 2016-03-21 ENCOUNTER — Ambulatory Visit: Payer: Medicare Other

## 2016-03-21 VITALS — BP 106/68 | HR 72 | Resp 20 | Ht 66.0 in | Wt 155.0 lb

## 2016-03-21 DIAGNOSIS — Z951 Presence of aortocoronary bypass graft: Secondary | ICD-10-CM

## 2016-03-21 DIAGNOSIS — J90 Pleural effusion, not elsewhere classified: Secondary | ICD-10-CM | POA: Diagnosis not present

## 2016-03-21 NOTE — Patient Instructions (Signed)
Continue to avoid any heavy lifting or strenuous use of your arms or shoulders for at least a total of three months from the time of surgery.  After three months you may gradually increase how much you lift or otherwise use your arms or chest as tolerated, with limits based upon whether or not activities lead to the return of significant discomfort.  You may return to driving an automobile as long as you are no longer requiring oral narcotic pain relievers during the daytime.  It would be wise to start driving only short distances during the daylight and gradually increase from there as you feel comfortable.  You are encouraged to enroll and participate in the outpatient cardiac rehab program beginning as soon as practical.  Continue all previous medications without any changes at this time  

## 2016-03-21 NOTE — Progress Notes (Signed)
[address] CitySuite [address]       [place] [address]             [phone]     CARDIOTHORACIC SURGERY OFFICE NOTE  Referring Provider is HARDING, Leonie Green, MD Primary Cardiologist is Minus Breeding, MD PCP is Donnajean Lopes, MD   HPI:  Patient is a 70 year old female who returns to the office for routine follow-up status post emergency artery bypass grafting 2 on 02/19/2016 for critical left main coronary artery disease with acute evolving ST segment elevation myocardial infarction.  The patient's postoperative recovery in the hospital was uneventful and she was discharged home on the 5th postoperative day.  Since hospital discharge the patient has continued to do well. She was seen in follow-up by Richardson Dopp at [facility] on 03/17/2016 at which time she was doing fairly well. No changes in her medications were made at that time. She returns to our office for routine follow-up today. She states that she continues to do very well. Last week she had some sharp pain in the left side of her chest associated with deep inspiration and movement. This has resolved. She has not had any exertional chest pain or chest tightness. She has mild soreness in the anterior chest. She has not been taking any sort of pain medications. She denies any shortness of breath or appetite is improving. She has been very active, walking at least a mile every day. She states that her exercise tolerance continues to gradually improve. Yesterday she went to her local gym and walked on a treadmill for more than 30 minutes without difficulty. She is sleeping well at night. Overall she has no complaints.   Current Outpatient Prescriptions  Medication Sig Dispense Refill  . aspirin 81 MG tablet Take 81 mg by mouth daily.    Marland Kitchen atenolol (TENORMIN) 25 MG tablet Take 0.5 tablets (12.5 mg total) by mouth 2 (two) times daily.    Marland Kitchen atorvastatin (LIPITOR) 80 MG tablet Take 80 mg by mouth daily.    . calcium-vitamin  D (OSCAL WITH D) 500-200 MG-UNIT per tablet Take 1 tablet by mouth 2 (two) times daily.    . clopidogrel (PLAVIX) 75 MG tablet Take 1 tablet (75 mg total) by mouth daily. 30 tablet 1  . Co-Enzyme Q10 200 MG CAPS Take 1 capsule by mouth daily.     . Magnesium 500 MG TABS Take 1 tablet by mouth daily.     . metFORMIN (GLUCOPHAGE-XR) 500 MG 24 hr tablet Take 1 tablet by mouth every evening.     . Multiple Vitamin (MULTIVITAMIN) tablet Take 1 tablet by mouth daily.    . traMADol (ULTRAM) 50 MG tablet Take 1-2 tablets (50-100 mg total) by mouth every 4 (four) hours as needed for moderate pain. 30 tablet 0   No current facility-administered medications for this visit.      Physical Exam:   BP 106/68 mmHg  Pulse 72  Resp 20  Ht 5\' 6"  (1.676 m)  Wt 155 lb (70.308 kg)  BMI 25.03 kg/m2  SpO2 96%  LMP  (LMP Unknown)  General:  Well-appearing  Chest:   Clear to auscultation  CV:   Regular rate and rhythm without murmur  Incisions:  Healing nicely, sternum is stable  Abdomen:  Soft and nontender  Extremities:  Warm and well-perfused  Diagnostic Tests:  CHEST 2 VIEW  COMPARISON: March 17, 2016.  FINDINGS: The heart size and mediastinal contours are within normal  limits. No pneumothorax is noted. Status post Coronary artery bypass graft. Stable minimal left pleural effusion is noted compared to prior exam. Otherwise lungs are clear. The visualized skeletal structures are unremarkable.  IMPRESSION: Stable minimal left pleural effusion compared to prior exam.   Electronically Signed  By: Marijo Conception, M.D.  On: 03/21/2016 14:08   Impression:  Patient is doing very well approximately one month status post emergency artery bypass grafting.   Plan:  I have encouraged the patient to continue to gradually increase her physical activity as tolerated with her primary limitations remaining that she refrain from heavy lifting or strenuous use of her arms or shoulders for  at least another 2 months. I think she may resume driving an automobile. I've encouraged her to go head and enroll and begin participating in the outpatient cardiac rehabilitation program. We have not recommended any changes to her current medications at this time. She was not discharged from the hospital on an ACE inhibitor because of marginal blood pressure. Her blood pressure remains low normal at this time.  She was discharged from the hospital on dual antiplatelet therapy because of her acute presentation with ST segment elevation myocardial infarction. The patient has a routine follow-up appointment scheduled with Dr. Percival Spanish coming up in June. We will plan to have the patient return to our office for routine follow-up next April, approximately 1 year following her original surgery. She will call and return sooner should specific problems or difficulties arise. All of her questions have been addressed.    Valentina Gu. Roxy Manns, MD 03/21/2016 5:42 PM

## 2016-03-28 ENCOUNTER — Ambulatory Visit: Payer: Medicare Other | Admitting: Thoracic Surgery (Cardiothoracic Vascular Surgery)

## 2016-04-01 ENCOUNTER — Encounter: Payer: Self-pay | Admitting: Physician Assistant

## 2016-04-01 ENCOUNTER — Ambulatory Visit (HOSPITAL_COMMUNITY): Payer: Medicare Other | Attending: Cardiovascular Disease

## 2016-04-01 ENCOUNTER — Other Ambulatory Visit: Payer: Self-pay

## 2016-04-01 DIAGNOSIS — R29898 Other symptoms and signs involving the musculoskeletal system: Secondary | ICD-10-CM | POA: Diagnosis not present

## 2016-04-01 DIAGNOSIS — E119 Type 2 diabetes mellitus without complications: Secondary | ICD-10-CM | POA: Insufficient documentation

## 2016-04-01 DIAGNOSIS — I351 Nonrheumatic aortic (valve) insufficiency: Secondary | ICD-10-CM | POA: Diagnosis not present

## 2016-04-01 DIAGNOSIS — I517 Cardiomegaly: Secondary | ICD-10-CM | POA: Insufficient documentation

## 2016-04-01 DIAGNOSIS — I255 Ischemic cardiomyopathy: Secondary | ICD-10-CM | POA: Insufficient documentation

## 2016-04-01 DIAGNOSIS — I340 Nonrheumatic mitral (valve) insufficiency: Secondary | ICD-10-CM | POA: Diagnosis not present

## 2016-04-01 DIAGNOSIS — Z951 Presence of aortocoronary bypass graft: Secondary | ICD-10-CM | POA: Diagnosis not present

## 2016-04-01 DIAGNOSIS — Z87891 Personal history of nicotine dependence: Secondary | ICD-10-CM | POA: Diagnosis not present

## 2016-04-01 DIAGNOSIS — E785 Hyperlipidemia, unspecified: Secondary | ICD-10-CM | POA: Diagnosis not present

## 2016-04-04 ENCOUNTER — Telehealth: Payer: Self-pay | Admitting: *Deleted

## 2016-04-04 NOTE — Telephone Encounter (Signed)
Pt notified of echo results and findings. Pt aware of recommendation for Life Vest per [employer]. PA/Dr. Percival Spanish. I explained to pt what the life vest will do in helping the LV function squeeze. Pt advised Janan Halter, RN will call her this week to discuss more about the life vest as well as placing the life vest on her. Pt is agreeable to this plan of care

## 2016-04-26 ENCOUNTER — Telehealth: Payer: Self-pay | Admitting: Cardiology

## 2016-04-26 ENCOUNTER — Encounter (HOSPITAL_COMMUNITY)
Admission: RE | Admit: 2016-04-26 | Discharge: 2016-04-26 | Disposition: A | Discharge status: Home / Self Care | Payer: Medicare Other | Source: Ambulatory Visit | Attending: Cardiology | Admitting: Cardiology

## 2016-04-26 ENCOUNTER — Encounter (HOSPITAL_COMMUNITY): Payer: Self-pay

## 2016-04-26 VITALS — BP 102/68 | HR 71 | Ht 67.0 in | Wt 156.3 lb

## 2016-04-26 DIAGNOSIS — I701 Atherosclerosis of renal artery: Secondary | ICD-10-CM | POA: Diagnosis not present

## 2016-04-26 DIAGNOSIS — Z7982 Long term (current) use of aspirin: Secondary | ICD-10-CM | POA: Diagnosis not present

## 2016-04-26 DIAGNOSIS — Z7984 Long term (current) use of oral hypoglycemic drugs: Secondary | ICD-10-CM | POA: Diagnosis not present

## 2016-04-26 DIAGNOSIS — I213 ST elevation (STEMI) myocardial infarction of unspecified site: Secondary | ICD-10-CM | POA: Diagnosis not present

## 2016-04-26 DIAGNOSIS — Z87891 Personal history of nicotine dependence: Secondary | ICD-10-CM | POA: Insufficient documentation

## 2016-04-26 DIAGNOSIS — E785 Hyperlipidemia, unspecified: Secondary | ICD-10-CM | POA: Insufficient documentation

## 2016-04-26 DIAGNOSIS — I2510 Atherosclerotic heart disease of native coronary artery without angina pectoris: Secondary | ICD-10-CM | POA: Diagnosis not present

## 2016-04-26 DIAGNOSIS — Z79899 Other long term (current) drug therapy: Secondary | ICD-10-CM | POA: Insufficient documentation

## 2016-04-26 DIAGNOSIS — Z951 Presence of aortocoronary bypass graft: Secondary | ICD-10-CM | POA: Diagnosis not present

## 2016-04-26 NOTE — Telephone Encounter (Signed)
New message      Pt is at cardiac rehab now and nurse need to talk to a nurse

## 2016-04-26 NOTE — Telephone Encounter (Signed)
Monitor strips shown to DOD Dr. Gwenlyn Found - OK to proceed with walk test Notified Verdis Frederickson, RN that patient can proceed.

## 2016-04-26 NOTE — Progress Notes (Signed)
Cardiac Rehab Medication Review by a Pharmacist  Does the patient  feel that his/her medications are working for him/her?  yes  Has the patient been experiencing any side effects to the medications prescribed?  Yes, tiredness from when her atenolol was increased  Does the patient measure his/her own blood pressure or blood glucose at home?  no   Does the patient have any problems obtaining medications due to transportation or finances?   no  Understanding of regimen: excellent Understanding of indications: excellent Potential of compliance: excellent    Pharmacist comments: Answered all of the patient's question and went over side effects with the patient.  Darl Pikes, PharmD Clinical Pharmacist- Resident Pager: [phone]  Darl Pikes 04/26/2016 1:57 PM

## 2016-04-26 NOTE — Telephone Encounter (Signed)
Fax received. Will show to Dr. Percival Spanish

## 2016-04-26 NOTE — Telephone Encounter (Signed)
Patient is @ cardiac rehab for walk-test STEMI 3/30 >> CABGx2 >> life-vest Frequent PVCs or fusion beatsm - asymptomatic  BP OK - 102/68 Verdis Frederickson, RN would like MD to review strips before proceeding with walk test  Patient has follow up w/Dr. Percival Spanish on Friday 04/29/16

## 2016-04-28 NOTE — Progress Notes (Signed)
Cardiac Individual Treatment Plan  Patient Details  Name: Hailey Wright MRN: [id number] Date of Birth: Mar 28, 1946 Referring Provider:        CARDIAC REHAB PHASE II ORIENTATION from 04/26/2016 in [place]   Referring Provider  Minus Breeding MD      Initial Encounter Date:       CARDIAC REHAB PHASE II ORIENTATION from 04/26/2016 in [place]   Date  04/26/16   Referring Provider  Minus Breeding MD      Visit Diagnosis: ST elevation (STEMI) myocardial infarction of unspecified site ([place])  S/P CABG x 2  Patient's Home Medications on Admission:  Current outpatient prescriptions:  .  aspirin 81 MG tablet, Take 81 mg by mouth daily., Disp: , Rfl:  .  atenolol (TENORMIN) 25 MG tablet, Take 0.5 tablets (12.5 mg total) by mouth 2 (two) times daily., Disp: , Rfl:  .  atorvastatin (LIPITOR) 80 MG tablet, Take 80 mg by mouth daily., Disp: , Rfl:  .  calcium-vitamin D (OSCAL WITH D) 500-200 MG-UNIT per tablet, Take 1 tablet by mouth 2 (two) times daily., Disp: , Rfl:  .  clopidogrel (PLAVIX) 75 MG tablet, Take 1 tablet (75 mg total) by mouth daily., Disp: 30 tablet, Rfl: 1 .  Co-Enzyme Q10 200 MG CAPS, Take 1 capsule by mouth daily. , Disp: , Rfl:  .  Magnesium 500 MG TABS, Take 1 tablet by mouth daily. , Disp: , Rfl:  .  metFORMIN (GLUCOPHAGE-XR) 500 MG 24 hr tablet, Take 1 tablet by mouth every evening. , Disp: , Rfl:  .  Multiple Vitamin (MULTIVITAMIN) tablet, Take 1 tablet by mouth daily., Disp: , Rfl:  .  traMADol (ULTRAM) 50 MG tablet, Take 1-2 tablets (50-100 mg total) by mouth every 4 (four) hours as needed for moderate pain. (Patient not taking: Reported on 04/26/2016), Disp: 30 tablet, Rfl: 0  Past Medical History: Past Medical History  Diagnosis Date  . Hyperlipidemia   . MI (myocardial infarction) ([place])     Inferior treated with TPA 1991, POBA RCA 1996  . RAS (renal artery stenosis) ([place])     Right renal stent 2005   . Unstable angina ([place]) 02/17/2016  . CAD (coronary artery disease) 06/04/2013  . Acute ST elevation myocardial infarction (STEMI) involving left anterior descending coronary artery ([place]) 02/18/2016  . Acute ST elevation myocardial infarction (STEMI) involving left circumflex coronary artery ([place]) 02/18/2016  . Coronary artery thrombosis ([place]) 02/18/2016  . S/P Emergency CABG x 2 02/18/2016    LIMA to LAD, SVG to OM1, EVH via right thigh  . Ischemic cardiomyopathy     a. Echo 5/17: EF 25-30%, anteroseptal, anterior, anteroseptal and apical akinesis, possible inferior hypokinesis, grade 1 diastolic dysfunction, no evidence of thrombus, trivial AI, mild MR, mild LAE    Tobacco Use: History  Smoking status  . Former Smoker -- 1.00 packs/day for 40 years  . Types: Cigarettes  . Quit date: 06/05/2011  Smokeless tobacco  . 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 Latest Ref Rng 02/19/2016 02/19/2016 02/19/2016 02/19/2016 02/20/2016   PHART 7.350 - 7.450 7.405 7.331(L) 7.330(L) - -   PCO2ART 35.0 - 45.0 mmHg 38.5 44.0 42.9 - -   HCO3 20.0 - 24.0 mEq/L 24.0 23.1 22.6 - -   TCO2 0 - 100 mmol/L 25 24 24 23 24    ACIDBASEDEF  0.0 - 2.0 mmol/L 1.0 3.0(H) 3.0(H) - -   O2SAT - 97.0 96.0 93.0 - -      Capillary Blood Glucose: Lab Results  Component Value Date   GLUCAP 104* 02/23/2016   GLUCAP 107* 02/22/2016   GLUCAP 78 02/22/2016   GLUCAP 78 02/22/2016   GLUCAP 84 02/22/2016     Exercise Target Goals: Date: 04/26/16  Exercise Program Goal: Individual exercise prescription set with THRR, safety & activity barriers. Participant demonstrates ability to understand and report RPE using BORG scale, to self-measure pulse accurately, and to acknowledge the importance of the exercise prescription.  Exercise Prescription Goal: Starting with aerobic activity 30 plus minutes a day, 3 days per week for initial exercise prescription. Provide home  exercise prescription and guidelines that participant acknowledges understanding prior to discharge.  Activity Barriers & Risk Stratification:     Activity Barriers & Cardiac Risk Stratification - 04/26/16 1506    Activity Barriers & Cardiac Risk Stratification   Activity Barriers None   Cardiac Risk Stratification High      6 Minute Walk:     6 Minute Walk      04/26/16 1654       6 Minute Walk   Phase Initial     Distance 1700 feet     Walk Time 6 minutes     # of Rest Breaks 0     MPH 3.22     METS 3.65     RPE 13     VO2 Peak 12.79     Symptoms No     Resting HR 71 bpm     Resting BP 102/68 mmHg     Max Ex. HR 85 bpm     Max Ex. BP 118/80 mmHg     2 Minute Post BP 110/60 mmHg        Initial Exercise Prescription:     Initial Exercise Prescription - 04/26/16 1600    Date of Initial Exercise RX and Referring Provider   Date 04/26/16   Referring Provider Minus Breeding MD   Treadmill   MPH 2.8   Grade 1   Minutes 10   METs 3.53   Bike   Level 1   Minutes 10   METs 3.7   NuStep   Level 3   Minutes 10   METs 2.8   Prescription Details   Frequency (times per week) 3   Duration Progress to 30 minutes of continuous aerobic without signs/symptoms of physical distress   Intensity   THRR 40-80% of Max Heartrate 61-122 bpm   Ratings of Perceived Exertion 11-13   Perceived Dyspnea 0-4   Progression   Progression Continue to progress workloads to maintain intensity without signs/symptoms of physical distress.   Resistance Training   Training Prescription Yes   Weight 2 lb   Reps 10-12      Perform Capillary Blood Glucose checks as needed.  Exercise Prescription Changes:   Exercise Comments:   Discharge Exercise Prescription (Final Exercise Prescription Changes):   Nutrition:  Target Goals: Understanding of nutrition guidelines, daily intake of sodium 1500mg , cholesterol 200mg , calories 30% from fat and 7% or less from saturated fats, daily  to have 5 or more servings of fruits and vegetables.  Biometrics:     Pre Biometrics - 04/26/16 1656    Pre Biometrics   Height 5\' 7"  (1.702 m)   Weight 156 lb 4.9 oz (70.9 kg)   Waist Circumference 32 inches   Hip Circumference  39.5 inches   Waist to Hip Ratio 0.81 %   BMI (Calculated) 24.5   Triceps Skinfold 33.5 mm   % Body Fat 36.8 %   Grip Strength 34.5 kg   Flexibility 12.25 in   Single Leg Stand 30 seconds       Nutrition Therapy Plan and Nutrition Goals:     Nutrition Therapy & Goals - 04/28/16 1018    Nutrition Therapy   Diet Carb Modified, Therapeutic Lifestyle Changes    Personal Nutrition Goals   Personal Goal #1 Maintain wt around 156 lb while in [place], educate and counsel regarding individualized specific dietary modifications aiming towards targeted core components such as weight, hypertension, lipid management, diabetes, heart failure and other comorbidities.   Expected Outcomes Short Term Goal: Understand basic principles of dietary content, such as calories, fat, sodium, cholesterol and nutrients.;Long Term Goal: Adherence to prescribed nutrition plan.      Nutrition Discharge: Nutrition Scores:     Nutrition Assessments - 04/28/16 1019    MEDFICTS Scores   Pre Score 0      Nutrition Goals Re-Evaluation:   Psychosocial: Target Goals: Acknowledge presence or absence of depression, maximize coping skills, provide positive support system. Participant is able to verbalize types and ability to use techniques and skills needed for reducing stress and depression.  Initial Review & Psychosocial Screening:     Initial Psych Review & Screening - 04/28/16 [place]? Yes   Comments Joan lost her mother two years ago   Barriers   Psychosocial barriers to participate in program There are no identifiable barriers or psychosocial needs.   Screening Interventions    Interventions Encouraged to exercise      Quality of Life Scores:     Quality of Life - 04/26/16 1700    Quality of Life Scores   Health/Function Pre 26.89 %   Socioeconomic Pre 30 %   Psych/Spiritual Pre 28.29 %   Family Pre 30 %   GLOBAL Pre 28.27 %      PHQ-9:     Recent Review Flowsheet Data    There is no flowsheet data to display.      Psychosocial Evaluation and Intervention:   Psychosocial Re-Evaluation:   Vocational Rehabilitation: Provide vocational rehab assistance to qualifying candidates.   Vocational Rehab Evaluation & Intervention:     Vocational Rehab - 04/26/16 1541    Initial Vocational Rehab Evaluation & Intervention   Assessment shows need for Vocational Rehabilitation No      Education: Education Goals: Education classes will be provided on a weekly basis, covering required topics. Participant will state understanding/return demonstration of topics presented.  Learning Barriers/Preferences:     Learning Barriers/Preferences - 04/26/16 1540    Learning Barriers/Preferences   Learning Barriers None   Learning Preferences Group Instruction;Skilled Demonstration      Education Topics: Count Your Pulse:  -Group instruction provided by verbal instruction, demonstration, patient participation and written materials to support subject.  Instructors address importance of being able to find your pulse and how to count your pulse when at home without a heart monitor.  Patients get hands on experience counting their pulse with staff help and individually.   Heart Attack, Angina, and Risk Factor Modification:  -Group instruction provided by verbal instruction, video, and written materials to support subject.  Instructors address signs and symptoms of angina and heart attacks.  Also discuss risk factors for heart disease and how to make changes to improve heart health risk factors.   Functional Fitness:  -Group instruction provided by verbal  instruction, demonstration, patient participation, and written materials to support subject.  Instructors address safety measures for doing things around the house.  Discuss how to get up and down off the floor, how to pick things up properly, how to safely get out of a chair without assistance, and balance training.   Meditation and Mindfulness:  -Group instruction provided by verbal instruction, patient participation, and written materials to support subject.  Instructor addresses importance of mindfulness and meditation practice to help reduce stress and improve awareness.  Instructor also leads participants through a meditation exercise.    Stretching for Flexibility and Mobility:  -Group instruction provided by verbal instruction, patient participation, and written materials to support subject.  Instructors lead participants through series of stretches that are designed to increase flexibility thus improving mobility.  These stretches are additional exercise for major muscle groups that are typically performed during regular warm up and cool down.   Hands Only CPR Anytime:  -Group instruction provided by verbal instruction, video, patient participation and written materials to support subject.  Instructors co-teach with AHA video for hands only CPR.  Participants get hands on experience with mannequins.   Nutrition I class: Heart Healthy Eating:  -Group instruction provided by PowerPoint slides, verbal discussion, and written materials to support subject matter. The instructor gives an explanation and review of the Therapeutic Lifestyle Changes diet recommendations, which includes a discussion on lipid goals, dietary fat, sodium, fiber, plant stanol/sterol esters, sugar, and the components of a well-balanced, healthy diet.   Nutrition II class: Lifestyle Skills:  -Group instruction provided by PowerPoint slides, verbal discussion, and written materials to support subject matter. The  instructor gives an explanation and review of label reading, grocery shopping for heart health, heart healthy recipe modifications, and ways to make healthier choices when eating out.   Diabetes Question & Answer:  -Group instruction provided by PowerPoint slides, verbal discussion, and written materials to support subject matter. The instructor gives an explanation and review of diabetes co-morbidities, pre- and post-prandial blood glucose goals, pre-exercise blood glucose goals, signs, symptoms, and treatment of hypoglycemia and hyperglycemia, and foot care basics.   Diabetes Blitz:  -Group instruction provided by PowerPoint slides, verbal discussion, and written materials to support subject matter. The instructor gives an explanation and review of the physiology behind type 1 and type 2 diabetes, diabetes medications and rational behind using different medications, pre- and post-prandial blood glucose recommendations and Hemoglobin A1c goals, diabetes diet, and exercise including blood glucose guidelines for exercising safely.    Portion Distortion:  -Group instruction provided by PowerPoint slides, verbal discussion, written materials, and food models to support subject matter. The instructor gives an explanation of serving size versus portion size, changes in portions sizes over the last 20 years, and what consists of a serving from each food group.   Stress Management:  -Group instruction provided by verbal instruction, video, and written materials to support subject matter.  Instructors review role of stress in heart disease and how to cope with stress positively.     Exercising on Your Own:  -Group instruction provided by verbal instruction, power point, and written materials to support subject.  Instructors discuss benefits of exercise, components of exercise, frequency and intensity of exercise, and end points for exercise.  Also discuss use of nitroglycerin and activating EMS.  Review  options of places to exercise outside of rehab.  Review guidelines for sex with heart disease.   Cardiac Drugs I:  -Group instruction provided by verbal instruction and written materials to support subject.  Instructor reviews cardiac drug classes: antiplatelets, anticoagulants, beta blockers, and statins.  Instructor discusses reasons, side effects, and lifestyle considerations for each drug class.   Cardiac Drugs II:  -Group instruction provided by verbal instruction and written materials to support subject.  Instructor reviews cardiac drug classes: angiotensin converting enzyme inhibitors (ACE-I), angiotensin II receptor blockers (ARBs), nitrates, and calcium channel blockers.  Instructor discusses reasons, side effects, and lifestyle considerations for each drug class.   Anatomy and Physiology of the Circulatory System:  -Group instruction provided by verbal instruction, video, and written materials to support subject.  Reviews functional anatomy of heart, how it relates to various diagnoses, and what role the heart plays in the overall system.   Knowledge Questionnaire Score:     Knowledge Questionnaire Score - 04/26/16 1655    Knowledge Questionnaire Score   Pre Score 23/24      Core Components/Risk Factors/Patient Goals at Admission:     Personal Goals and Risk Factors at Admission - 04/26/16 1541    Core Components/Risk Factors/Patient Goals on Admission   Diabetes Yes   Intervention Provide education about proper nutrition, including hydration, and aerobic/resistive exercise prescription along with prescribed medications to achieve blood glucose in normal ranges: Fasting glucose 65-99 mg/dL   Expected Outcomes Short Term: Participant verbalizes understanding of the signs/symptoms and immediate care of hyper/hypoglycemia, proper foot care and importance of medication, aerobic/resistive exercise and nutrition plan for blood glucose control.   Lipids Yes   Expected Outcomes  Short Term: Participant states understanding of desired cholesterol values and is compliant with medications prescribed. Participant is following exercise prescription and nutrition guidelines.;Long Term: Cholesterol controlled with medications as prescribed, with individualized exercise RX and with personalized nutrition plan. Value goals: LDL < 70mg , HDL > 40 mg.      Core Components/Risk Factors/Patient Goals Review:    Core Components/Risk Factors/Patient Goals at Discharge (Final Review):    ITP Comments:     ITP Comments      04/26/16 1539           ITP Comments Dr. Fransico Him, Medical Director           Comments: Patient attended orientation from 1330 to 1530 to review rules and guidelines for program. Completed 6 minute walk test without complaints or symptoms, Intitial ITP, and exercise prescription.  VSS. Telemetry-Sinus Rhythm with frequent PVC's versus fusion beats.  Asymptomatic. Aurella is wearing a life vest. Dr Hochrein's office called and notified spoke with Marko Stai RN. Today's ECG tracings faxed to Dr Hochrein's office for review. Dr Gwenlyn Found reviewed the ECG tracings and said it is okay for Mrs Winns to proceed with her walk test. Janaiah has a follow up appointment with Dr Percival Spanish on Friday.

## 2016-04-29 ENCOUNTER — Encounter: Payer: Self-pay | Admitting: Cardiology

## 2016-04-29 ENCOUNTER — Ambulatory Visit (INDEPENDENT_AMBULATORY_CARE_PROVIDER_SITE_OTHER): Payer: Medicare Other | Admitting: Cardiology

## 2016-04-29 VITALS — BP 113/63 | HR 65 | Ht 66.0 in | Wt 154.4 lb

## 2016-04-29 DIAGNOSIS — I2510 Atherosclerotic heart disease of native coronary artery without angina pectoris: Secondary | ICD-10-CM | POA: Diagnosis not present

## 2016-04-29 DIAGNOSIS — I200 Unstable angina: Secondary | ICD-10-CM | POA: Diagnosis not present

## 2016-04-29 DIAGNOSIS — I5022 Chronic systolic (congestive) heart failure: Secondary | ICD-10-CM | POA: Diagnosis not present

## 2016-04-29 DIAGNOSIS — I255 Ischemic cardiomyopathy: Secondary | ICD-10-CM | POA: Diagnosis not present

## 2016-04-29 MED ORDER — LISINOPRIL 2.5 MG PO TABS: 2.5000 mg | ORAL_TABLET | Freq: Every day | ORAL | Status: DC

## 2016-04-29 MED ORDER — CARVEDILOL 3.125 MG PO TABS: 3.1250 mg | ORAL_TABLET | Freq: Two times a day (BID) | ORAL | Status: DC

## 2016-04-29 NOTE — Patient Instructions (Addendum)
Your physician has recommended you make the following change in your medication:   1.) STOP the atenolol. This has been replaced with carvedilol 3.125 mg. [facility].  2.) START lisinopril 2.5 mg  5-6 days after starting the carvedilol.   Your physician recommends that you return for lab work in: 3 weeks.  You have been referred to EP ASAP to evaluate for ICD implantation.

## 2016-04-29 NOTE — Progress Notes (Signed)
Cardiology Office Note   Date:  04/29/2016   ID:  Hailey Wright, DOB 1946/08/02, MRN [id number]  PCP:  Donnajean Lopes, MD  Cardiologist:   Minus Breeding, MD   No chief complaint on file.     History of Present Illness: Hailey Wright is a 70 y.o. female who presents for follow of CAD and ischemic cardiomyopathy.  She has a history of known coronary disease as previously described. She had a negative stress perfusion study in the fall of last year. However, she came back with symptoms of acute onset chest pain. The plan was an elective cardiac catheterization. However, prior to that presented with an acute anterior microinfarction. She had occlusion of the LAD with acute thrombus acute thrombus in the circumflex. She did have chronic occlusion of her right coronary artery. He was placed on intra-aortic balloon pump and had emergent CABG with a LIMA to the LAD and SVG to OM1. She is left with an ejection fraction of approximately 30%. Unfortunately she's not had room in her blood pressure for med titration. She's been wearing a Live Vest.  She actually feels well. The patient denies any new symptoms such as chest discomfort, neck or arm discomfort. There has been no new shortness of breath, PND or orthopnea. There have been no reported palpitations, presyncope or syncope.    Past Medical History  Diagnosis Date  . Hyperlipidemia   . MI (myocardial infarction) ([place])     Inferior treated with TPA 1991, POBA RCA 1996  . RAS (renal artery stenosis) ([place])     Right renal stent 2005  . Unstable angina ([place]) 02/17/2016  . CAD (coronary artery disease) 06/04/2013  . Acute ST elevation myocardial infarction (STEMI) involving left anterior descending coronary artery ([place]) 02/18/2016  . Acute ST elevation myocardial infarction (STEMI) involving left circumflex coronary artery ([place]) 02/18/2016  . Coronary artery thrombosis ([place]) 02/18/2016  . S/P Emergency CABG x 2 02/18/2016    LIMA to LAD, SVG to OM1,  EVH via right thigh  . Ischemic cardiomyopathy     a. Echo 5/17: EF 25-30%, anteroseptal, anterior, anteroseptal and apical akinesis, possible inferior hypokinesis, grade 1 diastolic dysfunction, no evidence of thrombus, trivial AI, mild MR, mild LAE    Past Surgical History  Procedure Laterality Date  . Rhinoplasty    . Tonsillectomy and adenoidectomy    . Cardiac catheterization N/A 02/18/2016    Procedure: Left Heart Cath and Coronary Angiography;  Surgeon: Leonie Man, MD;  Location: [facility];  Service: Cardiovascular;  Laterality: N/A;  . Coronary artery bypass graft N/A 02/18/2016    Procedure: CORONARY ARTERY BYPASS GRAFTING (CABG) times 2 using left internal mammary and right greater saphenous vein;  Surgeon: Rexene Alberts, MD;  Location: [facility];  Service: Open Heart Surgery;  Laterality: N/A;     Current Outpatient Prescriptions  Medication Sig Dispense Refill  . aspirin 81 MG tablet Take 81 mg by mouth daily.    Marland Kitchen atorvastatin (LIPITOR) 80 MG tablet Take 80 mg by mouth daily.    . calcium-vitamin D (OSCAL WITH D) 500-200 MG-UNIT per tablet Take 1 tablet by mouth 2 (two) times daily.    . clopidogrel (PLAVIX) 75 MG tablet Take 1 tablet (75 mg total) by mouth daily. 30 tablet 1  . Co-Enzyme Q10 200 MG CAPS Take 1 capsule by mouth daily.     . Magnesium 500 MG TABS Take 1 tablet by mouth daily.     Marland Kitchen  metFORMIN (GLUCOPHAGE-XR) 500 MG 24 hr tablet Take 1 tablet by mouth every evening.     . Multiple Vitamin (MULTIVITAMIN) tablet Take 1 tablet by mouth daily.    . carvedilol (COREG) 3.125 MG tablet Take 1 tablet (3.125 mg total) by mouth 2 (two) times daily. 60 tablet 3  . lisinopril (PRINIVIL,ZESTRIL) 2.5 MG tablet Take 1 tablet (2.5 mg total) by mouth daily. 30 tablet 3   No current facility-administered medications for this visit.    Allergies:   Review of patient's allergies indicates no known allergies.    ROS:  Please see the history of present illness.    Otherwise, review of systems are positive for none.   All other systems are reviewed and negative.    PHYSICAL EXAM: VS:  BP 113/63 mmHg  Pulse 65  Ht 5\' 6"  (1.676 m)  Wt 154 lb 6 oz (70.024 kg)  BMI 24.93 kg/m2  LMP  (LMP Unknown) , BMI Body mass index is 24.93 kg/(m^2). GENERAL:  Well appearing HEENT:  Pupils equal round and reactive, fundi not visualized, oral mucosa unremarkable NECK:  No jugular venous distention, waveform within normal limits, carotid upstroke brisk and symmetric, no bruits, no thyromegaly LYMPHATICS:  No cervical, inguinal adenopathy LUNGS:  Clear to auscultation bilaterally BACK:  No CVA tenderness CHEST:  Well healed sternotomy scar. HEART:  PMI not displaced or sustained,S1 and S2 within normal limits, no S3, no S4, no clicks, no rubs, no murmurs ABD:  Flat, positive bowel sounds normal in frequency in pitch, no bruits, no rebound, no guarding, no midline pulsatile mass, no hepatomegaly, no splenomegaly EXT:  2 plus pulses throughout, no edema, no cyanosis no clubbing SKIN:  No rashes no nodules    EKG:  EKG is not ordered today.    Recent Labs: 02/18/2016: ALT 50 02/20/2016: Magnesium 2.1 03/17/2016: BUN 16; Creat 0.91; Hemoglobin 12.1; Platelets 321; Potassium 4.6; Sodium 139    Lipid Panel No results found for: CHOL, TRIG, HDL, CHOLHDL, VLDL, LDLCALC, LDLDIRECT    Wt Readings from Last 3 Encounters:  04/29/16 154 lb 6 oz (70.024 kg)  04/26/16 156 lb 4.9 oz (70.9 kg)  03/21/16 155 lb (70.308 kg)       Other studies Reviewed: Additional studies/ records that were reviewed today include: Hospital records. Review of the above records demonstrates:  Please see elsewhere in the note.     ASSESSMENT AND PLAN:   CAD -   The patient has no ongoing symptoms. She'll continue with risk reduction. She's about to start cardiac rehabilitation.  Ischemic CM -   hHr blood pressure might allow the change in her medications. She is going to stop her  atenolol and start carvedilol 3.125 mg twice daily. If she tolerates this in several days following that she will start lisinopril 2.5 mg daily. She would then have a follow-up basic metabolic profile.   I will refer her to EP for consideration of an ICD.  We had a log discussion about this.   HL -   This is followed by Donnajean Lopes, MD.  The goal should be an LDL less than 70.  For now I will not change her meds.     Current medicines are reviewed at length with the patient today.  The patient does not have concerns regarding medicines.  The following changes have been made:  As above  Labs/ tests ordered today include:   Orders Placed This Encounter  Procedures  . Basic metabolic panel  .  Ambulatory referral to Cardiac Electrophysiology     Disposition:   FU with me in one month.     Signed, Minus Breeding, MD  04/29/2016 10:17 AM    [facility]

## 2016-05-02 ENCOUNTER — Other Ambulatory Visit: Payer: Self-pay | Admitting: *Deleted

## 2016-05-02 ENCOUNTER — Encounter (HOSPITAL_COMMUNITY)
Admission: RE | Admit: 2016-05-02 | Discharge: 2016-05-02 | Disposition: A | Discharge status: Home / Self Care | Payer: Medicare Other | Source: Ambulatory Visit | Attending: Cardiology | Admitting: Cardiology

## 2016-05-02 ENCOUNTER — Encounter (HOSPITAL_COMMUNITY): Payer: Self-pay

## 2016-05-02 DIAGNOSIS — Z7984 Long term (current) use of oral hypoglycemic drugs: Secondary | ICD-10-CM | POA: Diagnosis not present

## 2016-05-02 DIAGNOSIS — Z951 Presence of aortocoronary bypass graft: Secondary | ICD-10-CM

## 2016-05-02 DIAGNOSIS — E785 Hyperlipidemia, unspecified: Secondary | ICD-10-CM | POA: Diagnosis not present

## 2016-05-02 DIAGNOSIS — Z7982 Long term (current) use of aspirin: Secondary | ICD-10-CM | POA: Diagnosis not present

## 2016-05-02 DIAGNOSIS — Z79899 Other long term (current) drug therapy: Secondary | ICD-10-CM | POA: Diagnosis not present

## 2016-05-02 DIAGNOSIS — I213 ST elevation (STEMI) myocardial infarction of unspecified site: Secondary | ICD-10-CM

## 2016-05-02 LAB — GLUCOSE, CAPILLARY
GLUCOSE-CAPILLARY: 59 mg/dL — AB (ref 65–99)
GLUCOSE-CAPILLARY: 130 mg/dL — AB (ref 65–99)

## 2016-05-02 MED ORDER — CLOPIDOGREL BISULFATE 75 MG PO TABS: 75.0000 mg | ORAL_TABLET | Freq: Every day | ORAL | Status: DC

## 2016-05-02 NOTE — Progress Notes (Signed)
Daily Session Note  Patient Details  Name: Hailey Wright MRN: [id number] Date of Birth: 04/26/1946 Referring Provider:            CARDIAC REHAB PHASE II ORIENTATION from 04/26/2016 in [facility]   Referring Provider  Hochrein, James MD      Encounter Date: 05/02/2016  Check In:     Session Check In - 05/02/16 0910    Check-In   Location [facility]   Staff Present Amber Fair, MS, ACSM RCEP, Exercise Physiologist;Joann Rion, RN, BSN;Carlette Carlton, RN, BSN;Portia Payne, RN, BSN   Supervising physician immediately available to respond to emergencies [facility] immediately available   Physician(s) Dr. Merrell    Medication changes reported     No   Fall or balance concerns reported    No   Warm-up and Cool-down Performed as group-led instruction   Resistance Training Performed Yes   VAD Patient? No   Pain Assessment   Currently in Pain? No/denies      Capillary Blood Glucose: No results found for this or any previous visit (from the past 24 hour(s)).   Goals Met:  Exercise tolerated well No report of cardiac concerns or symptoms  Goals Unmet:  Hypoglycemia with exercise   Comments: Pt started cardiac rehab today.  Pt tolerated light exercise without difficulty. VSS, telemetry-sinus rhythm, asymptomatic. Post exercise CBG-59.  Pt c/o mild lightheadedness.  Pt given gingerale and banana.  15 minute recheck CBG:  130.   Pt reports medication changes.  Medication list reconciled.Pt denies barriers to medicaiton compliance.  PSYCHOSOCIAL ASSESSMENT:  PHQ-0. Pt exhibits positive coping skills, hopeful outlook with supportive family. No psychosocial needs identified at this time, no psychosocial interventions necessary.    Pt enjoys yardwork, knitting, spending time with family. Five years ago, pt moved to this area from [place] to be closer to her daughters family.  Pt goals for cardiac rehab are to not be physically limited by  coronary disease. Pt states "I don't want to be a cardiac cripple".  Pt is concern and verbalizes apprehension about her health due to low EF and extensive myocardial damage.  Pt offered reassurance and emotional support.  .   Pt oriented to exercise equipment and routine.    Understanding verbalized.    Dr. Traci Turner is Medical Director for Cardiac Rehab at [facility]. 

## 2016-05-02 NOTE — Progress Notes (Signed)
Electrophysiology Office Note   Date:  05/03/2016   ID:  Hailey Wright, DOB February 15, 1946, MRN [id number]  PCP:  Donnajean Lopes, MD  Cardiologist:  Hochrein Primary Electrophysiologist:  Will Meredith Leeds, MD    Chief Complaint  Patient presents with  . Advice Only     History of Present Illness: Hailey Wright is a 70 y.o. female who presents today for electrophysiology evaluation.   Hx CAD and ischemic cardiomyopathy. In March 2017, had emergent CABG with LIMA to the LAD and SVG to OM1. She is left with an ejection fraction of approximately 30%. BP meds have been unable to be titrated due to hypotension. She has overall been feeling well without major complaint. She does get some shortness of breath and cough. She says that she does sleep with the head of the bed elevated. Otherwise, she is exercising as much as she can and eating lots of kale all that she feels like this may help her heart function.  Today, she denies symptoms of palpitations, chest pain, PND, lower extremity edema, claudication, dizziness, presyncope, syncope, bleeding, or neurologic sequela. The patient is tolerating medications without difficulties and is otherwise without complaint today.    Past Medical History  Diagnosis Date  . Hyperlipidemia   . MI (myocardial infarction) ([place])     Inferior treated with TPA 1991, POBA RCA 1996  . RAS (renal artery stenosis) ([place])     Right renal stent 2005  . Unstable angina ([place]) 02/17/2016  . CAD (coronary artery disease) 06/04/2013  . Acute ST elevation myocardial infarction (STEMI) involving left anterior descending coronary artery ([place]) 02/18/2016  . Acute ST elevation myocardial infarction (STEMI) involving left circumflex coronary artery ([place]) 02/18/2016  . Coronary artery thrombosis ([place]) 02/18/2016  . S/P Emergency CABG x 2 02/18/2016    LIMA to LAD, SVG to OM1, EVH via right thigh  . Ischemic cardiomyopathy     a. Echo 5/17: EF 25-30%, anteroseptal, anterior,  anteroseptal and apical akinesis, possible inferior hypokinesis, grade 1 diastolic dysfunction, no evidence of thrombus, trivial AI, mild MR, mild LAE   Past Surgical History  Procedure Laterality Date  . Rhinoplasty    . Tonsillectomy and adenoidectomy    . Cardiac catheterization N/A 02/18/2016    Procedure: Left Heart Cath and Coronary Angiography;  Surgeon: Leonie Man, MD;  Location: [facility];  Service: Cardiovascular;  Laterality: N/A;  . Coronary artery bypass graft N/A 02/18/2016    Procedure: CORONARY ARTERY BYPASS GRAFTING (CABG) times 2 using left internal mammary and right greater saphenous vein;  Surgeon: Rexene Alberts, MD;  Location: [facility];  Service: Open Heart Surgery;  Laterality: N/A;     Current Outpatient Prescriptions  Medication Sig Dispense Refill  . aspirin 81 MG tablet Take 81 mg by mouth daily.    Marland Kitchen atorvastatin (LIPITOR) 80 MG tablet Take 80 mg by mouth daily.    . calcium-vitamin D (OSCAL WITH D) 500-200 MG-UNIT per tablet Take 1 tablet by mouth 2 (two) times daily.    . carvedilol (COREG) 3.125 MG tablet Take 1 tablet (3.125 mg total) by mouth 2 (two) times daily. 60 tablet 3  . clopidogrel (PLAVIX) 75 MG tablet Take 1 tablet (75 mg total) by mouth daily. 30 tablet 6  . Co-Enzyme Q10 200 MG CAPS Take 1 capsule by mouth daily.     Marland Kitchen lisinopril (PRINIVIL,ZESTRIL) 2.5 MG tablet Take 1 tablet (2.5 mg total) by mouth daily. 30 tablet 3  .  Magnesium 500 MG TABS Take 1 tablet by mouth daily.     . metFORMIN (GLUCOPHAGE-XR) 500 MG 24 hr tablet Take 1 tablet by mouth every evening.     . Multiple Vitamin (MULTIVITAMIN) tablet Take 1 tablet by mouth daily.     No current facility-administered medications for this visit.    Allergies:   Review of patient's allergies indicates no known allergies.   Social History:  The patient  reports that she quit smoking about 4 years ago. Her smoking use included Cigarettes. She has a 40 pack-year smoking history. She  has quit using smokeless tobacco. She reports that she does not drink alcohol or use illicit drugs.   Family History:  The patient's family history includes Heart attack (age of onset: 53) in her brother; Heart attack (age of onset: 23) in her father.    ROS:  Please see the history of present illness.   Otherwise, review of systems is positive for dizziness, balance problems.   All other systems are reviewed and negative.    PHYSICAL EXAM: VS:  BP 82/64 mmHg  Pulse 69  Ht 5\' 6"  (1.676 m)  Wt 154 lb (69.854 kg)  BMI 24.87 kg/m2  LMP  (LMP Unknown) , BMI Body mass index is 24.87 kg/(m^2). GEN: Well nourished, well developed, in no acute distress HEENT: normal Neck: no JVD, carotid bruits, or masses Cardiac: RRR; no murmurs, rubs, or gallops,no edema  Respiratory:  clear to auscultation bilaterally, normal work of breathing GI: soft, nontender, nondistended, + BS MS: no deformity or atrophy Skin: warm and dry Neuro:  Strength and sensation are intact Psych: euthymic mood, full affect  EKG:  EKG is ordered today. The ekg ordered today shows sinus rhythm, rate 69, anterior Q waves, PVCs, lateral TWI  Recent Labs: 02/18/2016: ALT 50 02/20/2016: Magnesium 2.1 03/17/2016: BUN 16; Creat 0.91; Hemoglobin 12.1; Platelets 321; Potassium 4.6; Sodium 139    Lipid Panel  No results found for: CHOL, TRIG, HDL, CHOLHDL, VLDL, LDLCALC, LDLDIRECT   Wt Readings from Last 3 Encounters:  05/03/16 154 lb (69.854 kg)  04/29/16 154 lb 6 oz (70.024 kg)  04/26/16 156 lb 4.9 oz (70.9 kg)      Other studies Reviewed: Additional studies/ records that were reviewed today include:  TTE 04/01/16 - Left ventricle: The cavity size was normal. Wall thickness was  normal. Systolic function was severely reduced. The estimated  ejection fraction was in the range of 25% to 30%. Akinesis and  scarring of the mid-apicalanteroseptal, anterior, inferoseptal,  and apical myocardium; consistent with  infarction in the  distribution of the left anterior descending coronary artery.  Possible severe hypokinesis of the basalinferior myocardium;  consistent with ischemia in the distribution of the right  coronary artery. Doppler parameters are consistent with abnormal  left ventricular relaxation (grade 1 diastolic dysfunction). No  evidence of thrombus. - Aortic valve: There was trivial regurgitation. - Mitral valve: There was mild regurgitation. - Left atrium: The atrium was mildly dilated.  ASSESSMENT AND PLAN:  1.  Ischemic cardiomyopathy: LV found to be 25-30% at the time of cath. Has been on OMT for the last 2.5 months.  She needs to wait for 3 months before a defibrillator is implanted. She is on optimal medical therapy and therefore we'll get a echocardiogram at the beginning of July to further determine if her ejection fraction is low. If it continues to be low, we'll plan on an ICD. I did discuss with her the risks  and benefits of the procedure. Risks include bleeding, infection, tamponade, and pneumothorax. She understands these risks and has agreed to the procedure should she need it.    Current medicines are reviewed at length with the patient today.   The patient does not have concerns regarding her medicines.  The following changes were made today:  none  Labs/ tests ordered today include:  Orders Placed This Encounter  Procedures  . EKG 12-Lead  . ECHOCARDIOGRAM COMPLETE     Disposition:   FU with Will Camnitz 3 months  Signed, Will Meredith Leeds, MD  05/03/2016 11:28 AM     [address] [place] [address] [phone] (office) [phone] (fax)

## 2016-05-02 NOTE — Telephone Encounter (Signed)
Rx request sent to pharmacy.  

## 2016-05-03 ENCOUNTER — Other Ambulatory Visit: Payer: Self-pay | Admitting: Cardiology

## 2016-05-03 ENCOUNTER — Encounter: Payer: Self-pay | Admitting: Cardiology

## 2016-05-03 ENCOUNTER — Ambulatory Visit (INDEPENDENT_AMBULATORY_CARE_PROVIDER_SITE_OTHER): Payer: Medicare Other | Admitting: Cardiology

## 2016-05-03 VITALS — BP 82/64 | HR 69 | Ht 66.0 in | Wt 154.0 lb

## 2016-05-03 DIAGNOSIS — I5022 Chronic systolic (congestive) heart failure: Secondary | ICD-10-CM

## 2016-05-03 MED ORDER — CLOPIDOGREL BISULFATE 75 MG PO TABS: 75.0000 mg | ORAL_TABLET | Freq: Every day | ORAL | Status: DC

## 2016-05-03 NOTE — Patient Instructions (Signed)
Medication Instructions:  Your physician recommends that you continue on your current medications as directed. Please refer to the Current Medication list given to you today.  Labwork: None ordered  Testing/Procedures: Your physician has requested that you have an echocardiogram. Echocardiography is a painless test that uses sound waves to create images of your heart. It provides your doctor with information about the size and shape of your heart and how well your heart's chambers and valves are working. This procedure takes approximately one hour. There are no restrictions for this procedure.  Follow-Up: To be determined once echo has been reviewed by Dr. Curt Bears.  We will call you with the results.  If you need a refill on your cardiac medications before your next appointment, please call your pharmacy. Thank you for choosing [facility]!!   Trinidad Curet, RN [phone]

## 2016-05-03 NOTE — Telephone Encounter (Signed)
Rx request sent to pharmacy.  

## 2016-05-04 ENCOUNTER — Encounter (HOSPITAL_COMMUNITY)
Admission: RE | Admit: 2016-05-04 | Discharge: 2016-05-04 | Disposition: A | Discharge status: Home / Self Care | Payer: Medicare Other | Source: Ambulatory Visit | Attending: Cardiology | Admitting: Cardiology

## 2016-05-04 DIAGNOSIS — Z951 Presence of aortocoronary bypass graft: Secondary | ICD-10-CM | POA: Diagnosis not present

## 2016-05-04 DIAGNOSIS — Z7982 Long term (current) use of aspirin: Secondary | ICD-10-CM | POA: Diagnosis not present

## 2016-05-04 DIAGNOSIS — Z79899 Other long term (current) drug therapy: Secondary | ICD-10-CM | POA: Diagnosis not present

## 2016-05-04 DIAGNOSIS — Z7984 Long term (current) use of oral hypoglycemic drugs: Secondary | ICD-10-CM | POA: Diagnosis not present

## 2016-05-04 DIAGNOSIS — I213 ST elevation (STEMI) myocardial infarction of unspecified site: Secondary | ICD-10-CM | POA: Diagnosis not present

## 2016-05-04 DIAGNOSIS — E785 Hyperlipidemia, unspecified: Secondary | ICD-10-CM | POA: Diagnosis not present

## 2016-05-04 LAB — GLUCOSE, CAPILLARY
GLUCOSE-CAPILLARY: 91 mg/dL (ref 65–99)
GLUCOSE-CAPILLARY: 87 mg/dL (ref 65–99)
Glucose-Capillary: 77 mg/dL (ref 65–99)

## 2016-05-04 NOTE — Progress Notes (Signed)
Daily Session Note  Patient Details  Name: Hailey Wright MRN: [id number] Date of Birth: 1946/01/18 Referring Provider:        CARDIAC REHAB PHASE II ORIENTATION from 04/26/2016 in [facility]   Referring Provider  Minus Breeding MD      Encounter Date: 05/04/2016  Check In:     Session Check In - 05/04/16 1008    Check-In   Location [facility]   Staff Present Luetta Nutting Fair, MS, ACSM RCEP, Exercise Physiologist;Ryett Hamman, RN, BSN;Carlette Carlton, RN, BSN   Supervising physician immediately available to respond to emergencies [facility] immediately available   Physician(s) Dr. Marily Memos    Medication changes reported     No   Fall or balance concerns reported    No   Warm-up and Cool-down Performed as group-led instruction   Resistance Training Performed No   VAD Patient? No   Pain Assessment   Currently in Pain? No/denies      Capillary Blood Glucose: No results found for this or any previous visit (from the past 24 hour(s)).   Goals Met:  Exercise tolerated well  Goals Unmet:  Not Applicable  Comments: recommendation received from Dr. Philip Aspen for pt to hold metformin on exercise day. Since pt takes qPM, pt instructed to hold on evening prior to scheduled cardiac rehab session. Pt informed, understanding verbalized.    Dr. Fransico Him is Medical Director for Cardiac Rehab at [facility].

## 2016-05-06 ENCOUNTER — Encounter (HOSPITAL_COMMUNITY)
Admission: RE | Admit: 2016-05-06 | Discharge: 2016-05-06 | Disposition: A | Discharge status: Home / Self Care | Payer: Medicare Other | Source: Ambulatory Visit | Attending: Cardiology | Admitting: Cardiology

## 2016-05-06 DIAGNOSIS — Z7982 Long term (current) use of aspirin: Secondary | ICD-10-CM | POA: Diagnosis not present

## 2016-05-06 DIAGNOSIS — I213 ST elevation (STEMI) myocardial infarction of unspecified site: Secondary | ICD-10-CM

## 2016-05-06 DIAGNOSIS — Z7984 Long term (current) use of oral hypoglycemic drugs: Secondary | ICD-10-CM | POA: Diagnosis not present

## 2016-05-06 DIAGNOSIS — E785 Hyperlipidemia, unspecified: Secondary | ICD-10-CM | POA: Diagnosis not present

## 2016-05-06 DIAGNOSIS — Z951 Presence of aortocoronary bypass graft: Secondary | ICD-10-CM

## 2016-05-06 DIAGNOSIS — Z79899 Other long term (current) drug therapy: Secondary | ICD-10-CM | POA: Diagnosis not present

## 2016-05-06 LAB — GLUCOSE, CAPILLARY: GLUCOSE-CAPILLARY: 86 mg/dL (ref 65–99)

## 2016-05-09 ENCOUNTER — Encounter (HOSPITAL_COMMUNITY)
Admission: RE | Admit: 2016-05-09 | Discharge: 2016-05-09 | Disposition: A | Discharge status: Home / Self Care | Payer: Medicare Other | Source: Ambulatory Visit | Attending: Cardiology | Admitting: Cardiology

## 2016-05-09 DIAGNOSIS — Z951 Presence of aortocoronary bypass graft: Secondary | ICD-10-CM | POA: Diagnosis not present

## 2016-05-09 DIAGNOSIS — Z7982 Long term (current) use of aspirin: Secondary | ICD-10-CM | POA: Diagnosis not present

## 2016-05-09 DIAGNOSIS — E785 Hyperlipidemia, unspecified: Secondary | ICD-10-CM | POA: Diagnosis not present

## 2016-05-09 DIAGNOSIS — Z79899 Other long term (current) drug therapy: Secondary | ICD-10-CM | POA: Diagnosis not present

## 2016-05-09 DIAGNOSIS — I213 ST elevation (STEMI) myocardial infarction of unspecified site: Secondary | ICD-10-CM

## 2016-05-09 DIAGNOSIS — Z7984 Long term (current) use of oral hypoglycemic drugs: Secondary | ICD-10-CM | POA: Diagnosis not present

## 2016-05-09 LAB — GLUCOSE, CAPILLARY: Glucose-Capillary: 89 mg/dL (ref 65–99)

## 2016-05-11 ENCOUNTER — Encounter (HOSPITAL_COMMUNITY)
Admission: RE | Admit: 2016-05-11 | Discharge: 2016-05-11 | Disposition: A | Discharge status: Home / Self Care | Payer: Medicare Other | Source: Ambulatory Visit | Attending: Cardiology | Admitting: Cardiology

## 2016-05-11 DIAGNOSIS — Z79899 Other long term (current) drug therapy: Secondary | ICD-10-CM | POA: Diagnosis not present

## 2016-05-11 DIAGNOSIS — Z951 Presence of aortocoronary bypass graft: Secondary | ICD-10-CM

## 2016-05-11 DIAGNOSIS — I213 ST elevation (STEMI) myocardial infarction of unspecified site: Secondary | ICD-10-CM | POA: Diagnosis not present

## 2016-05-11 DIAGNOSIS — E785 Hyperlipidemia, unspecified: Secondary | ICD-10-CM | POA: Diagnosis not present

## 2016-05-11 DIAGNOSIS — Z7982 Long term (current) use of aspirin: Secondary | ICD-10-CM | POA: Diagnosis not present

## 2016-05-11 DIAGNOSIS — Z7984 Long term (current) use of oral hypoglycemic drugs: Secondary | ICD-10-CM | POA: Diagnosis not present

## 2016-05-11 NOTE — Progress Notes (Signed)
Reviewed home exercise with pt today.  Pt plans to walk,bike and use nustep at [facility] for exercise; 2-3x/week in addition to cardiac rehab. Reviewed THR, pulse, RPE, sign and symptoms, and when to call 911 or MD.  Also discussed weather considerations and indoor options.  Pt voiced understanding.    Hailey Wright [employer]

## 2016-05-11 NOTE — Progress Notes (Signed)
Cardiac Individual Treatment Plan  Patient Details  Name: Hailey Wright MRN: [id number] Date of Birth: [birth year] Referring Provider:        CARDIAC REHAB PHASE II ORIENTATION from 04/26/2016 in [facility]   Referring Provider  Minus Breeding MD      Initial Encounter Date:       CARDIAC REHAB PHASE II ORIENTATION from 04/26/2016 in [facility]   Date  04/26/16   Referring Provider  Minus Breeding MD      Visit Diagnosis: ST elevation (STEMI) myocardial infarction of unspecified site ([place])  S/P CABG x 2  Patient's Home Medications on Admission:  Current outpatient prescriptions:  .  aspirin 81 MG tablet, Take 81 mg by mouth daily., Disp: , Rfl:  .  atorvastatin (LIPITOR) 80 MG tablet, Take 80 mg by mouth daily., Disp: , Rfl:  .  calcium-vitamin D (OSCAL WITH D) 500-200 MG-UNIT per tablet, Take 1 tablet by mouth 2 (two) times daily., Disp: , Rfl:  .  carvedilol (COREG) 3.125 MG tablet, Take 1 tablet (3.125 mg total) by mouth 2 (two) times daily., Disp: 60 tablet, Rfl: 3 .  clopidogrel (PLAVIX) 75 MG tablet, Take 1 tablet (75 mg total) by mouth daily., Disp: 30 tablet, Rfl: 6 .  Co-Enzyme Q10 200 MG CAPS, Take 1 capsule by mouth daily. , Disp: , Rfl:  .  lisinopril (PRINIVIL,ZESTRIL) 2.5 MG tablet, Take 1 tablet (2.5 mg total) by mouth daily., Disp: 30 tablet, Rfl: 3 .  Magnesium 500 MG TABS, Take 1 tablet by mouth daily. , Disp: , Rfl:  .  metFORMIN (GLUCOPHAGE-XR) 500 MG 24 hr tablet, Take 1 tablet by mouth every evening. , Disp: , Rfl:  .  Multiple Vitamin (MULTIVITAMIN) tablet, Take 1 tablet by mouth daily., Disp: , Rfl:   Past Medical History: Past Medical History  Diagnosis Date  . Hyperlipidemia   . MI (myocardial infarction) ([place])     Inferior treated with TPA 1991, POBA RCA 1996  . RAS (renal artery stenosis) ([place])     Right renal stent 2005  . Unstable angina ([place]) 02/17/2016  . CAD (coronary artery disease)  06/04/2013  . Acute ST elevation myocardial infarction (STEMI) involving left anterior descending coronary artery ([place]) 02/18/2016  . Acute ST elevation myocardial infarction (STEMI) involving left circumflex coronary artery ([place]) 02/18/2016  . Coronary artery thrombosis ([place]) 02/18/2016  . S/P Emergency CABG x 2 02/18/2016    LIMA to LAD, SVG to OM1, EVH via right thigh  . Ischemic cardiomyopathy     a. Echo 5/17: EF 25-30%, anteroseptal, anterior, anteroseptal and apical akinesis, possible inferior hypokinesis, grade 1 diastolic dysfunction, no evidence of thrombus, trivial AI, mild MR, mild LAE    Tobacco Use: History  Smoking status  . Former Smoker -- 1.00 packs/day for 40 years  . Types: Cigarettes  . Quit date: 06/05/2011  Smokeless tobacco  . 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 Latest Ref Rng 02/19/2016 02/19/2016 02/19/2016 02/19/2016 02/20/2016   PHART 7.350 - 7.450 7.405 7.331(L) 7.330(L) - -   PCO2ART 35.0 - 45.0 mmHg 38.5 44.0 42.9 - -   HCO3 20.0 - 24.0 mEq/L 24.0 23.1 22.6 - -   TCO2 0 - 100 mmol/L 25 24 24 23 24    ACIDBASEDEF 0.0 - 2.0 mmol/L 1.0 3.0(H) 3.0(H) - -   O2SAT - 97.0 96.0 93.0 - -  Capillary Blood Glucose: Lab Results  Component Value Date   GLUCAP 89 05/09/2016   GLUCAP 86 05/06/2016   GLUCAP 87 05/04/2016   GLUCAP 77 05/04/2016   GLUCAP 91 05/04/2016     Exercise Target Goals:    Exercise Program Goal: Individual exercise prescription set with THRR, safety & activity barriers. Participant demonstrates ability to understand and report RPE using BORG scale, to self-measure pulse accurately, and to acknowledge the importance of the exercise prescription.  Exercise Prescription Goal: Starting with aerobic activity 30 plus minutes a day, 3 days per week for initial exercise prescription. Provide home exercise prescription and guidelines that participant acknowledges understanding prior to  discharge.  Activity Barriers & Risk Stratification:     Activity Barriers & Cardiac Risk Stratification - 04/26/16 1506    Activity Barriers & Cardiac Risk Stratification   Activity Barriers None   Cardiac Risk Stratification High      6 Minute Walk:     6 Minute Walk      04/26/16 1654       6 Minute Walk   Phase Initial     Distance 1700 feet     Walk Time 6 minutes     # of Rest Breaks 0     MPH 3.22     METS 3.65     RPE 13     VO2 Peak 12.79     Symptoms No     Resting HR 71 bpm     Resting BP 102/68 mmHg     Max Ex. HR 85 bpm     Max Ex. BP 118/80 mmHg     2 Minute Post BP 110/60 mmHg        Initial Exercise Prescription:     Initial Exercise Prescription - 04/26/16 1600    Date of Initial Exercise RX and Referring Provider   Date 04/26/16   Referring Provider Minus Breeding MD   Treadmill   MPH 2.8   Grade 1   Minutes 10   METs 3.53   Bike   Level 1   Minutes 10   METs 3.7   NuStep   Level 3   Minutes 10   METs 2.8   Prescription Details   Frequency (times per week) 3   Duration Progress to 30 minutes of continuous aerobic without signs/symptoms of physical distress   Intensity   THRR 40-80% of Max Heartrate 61-122 bpm   Ratings of Perceived Exertion 11-13   Perceived Dyspnea 0-4   Progression   Progression Continue to progress workloads to maintain intensity without signs/symptoms of physical distress.   Resistance Training   Training Prescription Yes   Weight 2 lb   Reps 10-12      Perform Capillary Blood Glucose checks as needed.  Exercise Prescription Changes:   Exercise Comments:   Discharge Exercise Prescription (Final Exercise Prescription Changes):   Nutrition:  Target Goals: Understanding of nutrition guidelines, daily intake of sodium 1500mg , cholesterol 200mg , calories 30% from fat and 7% or less from saturated fats, daily to have 5 or more servings of fruits and vegetables.  Biometrics:     Pre Biometrics  - 04/26/16 1656    Pre Biometrics   Height 5\' 7"  (1.702 m)   Weight 156 lb 4.9 oz (70.9 kg)   Waist Circumference 32 inches   Hip Circumference 39.5 inches   Waist to Hip Ratio 0.81 %   BMI (Calculated) 24.5   Triceps Skinfold 33.5 mm   %  Body Fat 36.8 %   Grip Strength 34.5 kg   Flexibility 12.25 in   Single Leg Stand 30 seconds       Nutrition Therapy Plan and Nutrition Goals:     Nutrition Therapy & Goals - 04/28/16 1018    Nutrition Therapy   Diet Carb Modified, Therapeutic Lifestyle Changes    Personal Nutrition Goals   Personal Goal #1 Maintain wt around 156 lb while in [place], educate and counsel regarding individualized specific dietary modifications aiming towards targeted core components such as weight, hypertension, lipid management, diabetes, heart failure and other comorbidities.   Expected Outcomes Short Term Goal: Understand basic principles of dietary content, such as calories, fat, sodium, cholesterol and nutrients.;Long Term Goal: Adherence to prescribed nutrition plan.      Nutrition Discharge: Nutrition Scores:     Nutrition Assessments - 04/28/16 1019    MEDFICTS Scores   Pre Score 0      Nutrition Goals Re-Evaluation:   Psychosocial: Target Goals: Acknowledge presence or absence of depression, maximize coping skills, provide positive support system. Participant is able to verbalize types and ability to use techniques and skills needed for reducing stress and depression.  Initial Review & Psychosocial Screening:     Initial Psych Review & Screening - 05/02/16 1002    Family Dynamics   Good Support System? Yes   Barriers   Psychosocial barriers to participate in program There are no identifiable barriers or psychosocial needs.   Screening Interventions   Interventions Encouraged to exercise      Quality of Life Scores:     Quality of Life - 04/26/16 1700    Quality of Life Scores    Health/Function Pre 26.89 %   Socioeconomic Pre 30 %   Psych/Spiritual Pre 28.29 %   Family Pre 30 %   GLOBAL Pre 28.27 %      PHQ-9:     Recent Review Flowsheet Data    Depression screen [facility] 2/9 05/02/2016   Decreased Interest 0   Down, Depressed, Hopeless 0   PHQ - 2 Score 0      Psychosocial Evaluation and Intervention:     Psychosocial Evaluation - 05/11/16 1646    Psychosocial Evaluation & Interventions   Interventions Stress management education;Relaxation education;Encouraged to exercise with the program and follow exercise prescription   Continued Psychosocial Services Needed Yes      Psychosocial Re-Evaluation:   Vocational Rehabilitation: Provide vocational rehab assistance to qualifying candidates.   Vocational Rehab Evaluation & Intervention:     Vocational Rehab - 04/26/16 1541    Initial Vocational Rehab Evaluation & Intervention   Assessment shows need for Vocational Rehabilitation No      Education: Education Goals: Education classes will be provided on a weekly basis, covering required topics. Participant will state understanding/return demonstration of topics presented.  Learning Barriers/Preferences:     Learning Barriers/Preferences - 04/26/16 1540    Learning Barriers/Preferences   Learning Barriers None   Learning Preferences Group Instruction;Skilled Demonstration      Education Topics: Count Your Pulse:  -Group instruction provided by verbal instruction, demonstration, patient participation and written materials to support subject.  Instructors address importance of being able to find your pulse and how to count your pulse when at home without a heart monitor.  Patients get hands on experience counting their pulse with staff help and individually.   Heart Attack, Angina, and Risk Factor Modification:  -Group instruction  provided by verbal instruction, video, and written materials to support subject.  Instructors address signs and  symptoms of angina and heart attacks.    Also discuss risk factors for heart disease and how to make changes to improve heart health risk factors.   Functional Fitness:  -Group instruction provided by verbal instruction, demonstration, patient participation, and written materials to support subject.  Instructors address safety measures for doing things around the house.  Discuss how to get up and down off the floor, how to pick things up properly, how to safely get out of a chair without assistance, and balance training.   Meditation and Mindfulness:  -Group instruction provided by verbal instruction, patient participation, and written materials to support subject.  Instructor addresses importance of mindfulness and meditation practice to help reduce stress and improve awareness.  Instructor also leads participants through a meditation exercise.    Stretching for Flexibility and Mobility:  -Group instruction provided by verbal instruction, patient participation, and written materials to support subject.  Instructors lead participants through series of stretches that are designed to increase flexibility thus improving mobility.  These stretches are additional exercise for major muscle groups that are typically performed during regular warm up and cool down.          CARDIAC REHAB PHASE II EXERCISE from 05/11/2016 in [facility]   Date  05/11/16   Instruction Review Code  2- meets goals/outcomes      Hands Only CPR Anytime:  -Group instruction provided by verbal instruction, video, patient participation and written materials to support subject.  Instructors co-teach with AHA video for hands only CPR.  Participants get hands on experience with mannequins.      CARDIAC REHAB PHASE II EXERCISE from 05/11/2016 in [facility]   Date  05/06/16   Instruction Review Code  2- meets goals/outcomes      Nutrition I class: Heart Healthy  Eating:  -Group instruction provided by PowerPoint slides, verbal discussion, and written materials to support subject matter. The instructor gives an explanation and review of the Therapeutic Lifestyle Changes diet recommendations, which includes a discussion on lipid goals, dietary fat, sodium, fiber, plant stanol/sterol esters, sugar, and the components of a well-balanced, healthy diet.   Nutrition II class: Lifestyle Skills:  -Group instruction provided by PowerPoint slides, verbal discussion, and written materials to support subject matter. The instructor gives an explanation and review of label reading, grocery shopping for heart health, heart healthy recipe modifications, and ways to make healthier choices when eating out.   Diabetes Question & Answer:  -Group instruction provided by PowerPoint slides, verbal discussion, and written materials to support subject matter. The instructor gives an explanation and review of diabetes co-morbidities, pre- and post-prandial blood glucose goals, pre-exercise blood glucose goals, signs, symptoms, and treatment of hypoglycemia and hyperglycemia, and foot care basics.   Diabetes Blitz:  -Group instruction provided by PowerPoint slides, verbal discussion, and written materials to support subject matter. The instructor gives an explanation and review of the physiology behind type 1 and type 2 diabetes, diabetes medications and rational behind using different medications, pre- and post-prandial blood glucose recommendations and Hemoglobin A1c goals, diabetes diet, and exercise including blood glucose guidelines for exercising safely.    Portion Distortion:  -Group instruction provided by PowerPoint slides, verbal discussion, written materials, and food models to support subject matter. The instructor gives an explanation of serving size versus portion size, changes in portions sizes over the  last 20 years, and what consists of a serving from each food  group.   Stress Management:  -Group instruction provided by verbal instruction, video, and written materials to support subject matter.  Instructors review role of stress in heart disease and how to cope with stress positively.     Exercising on Your Own:  -Group instruction provided by verbal instruction, power point, and written materials to support subject.  Instructors discuss benefits of exercise, components of exercise, frequency and intensity of exercise, and end points for exercise.  Also discuss use of nitroglycerin and activating EMS.  Review options of places to exercise outside of rehab.  Review guidelines for sex with heart disease.   Cardiac Drugs I:  -Group instruction provided by verbal instruction and written materials to support subject.  Instructor reviews cardiac drug classes: antiplatelets, anticoagulants, beta blockers, and statins.  Instructor discusses reasons, side effects, and lifestyle considerations for each drug class.      CARDIAC REHAB PHASE II EXERCISE from 05/11/2016 in [place]   Date  05/04/16   Educator  Pharm D   Instruction Review Code  2- meets goals/outcomes      Cardiac Drugs II:  -Group instruction provided by verbal instruction and written materials to support subject.  Instructor reviews cardiac drug classes: angiotensin converting enzyme inhibitors (ACE-I), angiotensin II receptor blockers (ARBs), nitrates, and calcium channel blockers.  Instructor discusses reasons, side effects, and lifestyle considerations for each drug class.   Anatomy and Physiology of the Circulatory System:  -Group instruction provided by verbal instruction, video, and written materials to support subject.  Reviews functional anatomy of heart, how it relates to various diagnoses, and what role the heart plays in the overall system.   Knowledge Questionnaire Score:     Knowledge Questionnaire Score - 04/26/16 1655    Knowledge  Questionnaire Score   Pre Score 23/24      Core Components/Risk Factors/Patient Goals at Admission:     Personal Goals and Risk Factors at Admission - 04/26/16 1541    Core Components/Risk Factors/Patient Goals on Admission   Diabetes Yes   Intervention Provide education about proper nutrition, including hydration, and aerobic/resistive exercise prescription along with prescribed medications to achieve blood glucose in normal ranges: Fasting glucose 65-99 mg/dL   Expected Outcomes Short Term: Participant verbalizes understanding of the signs/symptoms and immediate care of hyper/hypoglycemia, proper foot care and importance of medication, aerobic/resistive exercise and nutrition plan for blood glucose control.   Lipids Yes   Expected Outcomes Short Term: Participant states understanding of desired cholesterol values and is compliant with medications prescribed. Participant is following exercise prescription and nutrition guidelines.;Long Term: Cholesterol controlled with medications as prescribed, with individualized exercise RX and with personalized nutrition plan. Value goals: LDL < 70mg , HDL > 40 mg.      Core Components/Risk Factors/Patient Goals Review:    Core Components/Risk Factors/Patient Goals at Discharge (Final Review):    ITP Comments:     ITP Comments      04/26/16 1539           ITP Comments Dr. Fransico Him, Medical Director           Comments: Pt is making expected progress toward personal goals after completing 6 sessions. Recommend continued exercise and life style modification education including  stress management and relaxation techniques to decrease cardiac risk profile.

## 2016-05-12 ENCOUNTER — Other Ambulatory Visit: Payer: Self-pay | Admitting: *Deleted

## 2016-05-12 MED ORDER — CARVEDILOL 3.125 MG PO TABS: 3.1250 mg | ORAL_TABLET | Freq: Two times a day (BID) | ORAL | Status: DC

## 2016-05-13 ENCOUNTER — Encounter (HOSPITAL_COMMUNITY): Payer: Medicare Other

## 2016-05-16 ENCOUNTER — Encounter (HOSPITAL_COMMUNITY)
Admission: RE | Admit: 2016-05-16 | Discharge: 2016-05-16 | Disposition: A | Discharge status: Home / Self Care | Payer: Medicare Other | Source: Ambulatory Visit | Attending: Cardiology | Admitting: Cardiology

## 2016-05-16 DIAGNOSIS — I213 ST elevation (STEMI) myocardial infarction of unspecified site: Secondary | ICD-10-CM | POA: Diagnosis not present

## 2016-05-16 DIAGNOSIS — Z951 Presence of aortocoronary bypass graft: Secondary | ICD-10-CM

## 2016-05-16 DIAGNOSIS — Z79899 Other long term (current) drug therapy: Secondary | ICD-10-CM | POA: Diagnosis not present

## 2016-05-16 DIAGNOSIS — Z7982 Long term (current) use of aspirin: Secondary | ICD-10-CM | POA: Diagnosis not present

## 2016-05-16 DIAGNOSIS — E785 Hyperlipidemia, unspecified: Secondary | ICD-10-CM | POA: Diagnosis not present

## 2016-05-16 DIAGNOSIS — Z7984 Long term (current) use of oral hypoglycemic drugs: Secondary | ICD-10-CM | POA: Diagnosis not present

## 2016-05-18 ENCOUNTER — Encounter (HOSPITAL_COMMUNITY)
Admission: RE | Admit: 2016-05-18 | Discharge: 2016-05-18 | Disposition: A | Discharge status: Home / Self Care | Payer: Medicare Other | Source: Ambulatory Visit | Attending: Cardiology | Admitting: Cardiology

## 2016-05-18 DIAGNOSIS — I213 ST elevation (STEMI) myocardial infarction of unspecified site: Secondary | ICD-10-CM

## 2016-05-18 DIAGNOSIS — Z7982 Long term (current) use of aspirin: Secondary | ICD-10-CM | POA: Diagnosis not present

## 2016-05-18 DIAGNOSIS — Z951 Presence of aortocoronary bypass graft: Secondary | ICD-10-CM

## 2016-05-18 DIAGNOSIS — Z7984 Long term (current) use of oral hypoglycemic drugs: Secondary | ICD-10-CM | POA: Diagnosis not present

## 2016-05-18 DIAGNOSIS — E785 Hyperlipidemia, unspecified: Secondary | ICD-10-CM | POA: Diagnosis not present

## 2016-05-18 DIAGNOSIS — Z79899 Other long term (current) drug therapy: Secondary | ICD-10-CM | POA: Diagnosis not present

## 2016-05-20 ENCOUNTER — Other Ambulatory Visit: Payer: Self-pay

## 2016-05-20 ENCOUNTER — Ambulatory Visit (HOSPITAL_COMMUNITY): Payer: Medicare Other | Attending: Cardiology

## 2016-05-20 ENCOUNTER — Encounter (HOSPITAL_COMMUNITY)
Admission: RE | Admit: 2016-05-20 | Discharge: 2016-05-20 | Disposition: A | Discharge status: Home / Self Care | Payer: Medicare Other | Source: Ambulatory Visit | Attending: Cardiology | Admitting: Cardiology

## 2016-05-20 DIAGNOSIS — I2510 Atherosclerotic heart disease of native coronary artery without angina pectoris: Secondary | ICD-10-CM | POA: Diagnosis not present

## 2016-05-20 DIAGNOSIS — Z7984 Long term (current) use of oral hypoglycemic drugs: Secondary | ICD-10-CM | POA: Diagnosis not present

## 2016-05-20 DIAGNOSIS — I252 Old myocardial infarction: Secondary | ICD-10-CM | POA: Diagnosis not present

## 2016-05-20 DIAGNOSIS — Z951 Presence of aortocoronary bypass graft: Secondary | ICD-10-CM

## 2016-05-20 DIAGNOSIS — I351 Nonrheumatic aortic (valve) insufficiency: Secondary | ICD-10-CM | POA: Diagnosis not present

## 2016-05-20 DIAGNOSIS — I517 Cardiomegaly: Secondary | ICD-10-CM | POA: Diagnosis not present

## 2016-05-20 DIAGNOSIS — I213 ST elevation (STEMI) myocardial infarction of unspecified site: Secondary | ICD-10-CM | POA: Diagnosis not present

## 2016-05-20 DIAGNOSIS — Z7982 Long term (current) use of aspirin: Secondary | ICD-10-CM | POA: Diagnosis not present

## 2016-05-20 DIAGNOSIS — Z79899 Other long term (current) drug therapy: Secondary | ICD-10-CM | POA: Diagnosis not present

## 2016-05-20 DIAGNOSIS — I255 Ischemic cardiomyopathy: Secondary | ICD-10-CM | POA: Diagnosis not present

## 2016-05-20 DIAGNOSIS — R29898 Other symptoms and signs involving the musculoskeletal system: Secondary | ICD-10-CM | POA: Diagnosis not present

## 2016-05-20 DIAGNOSIS — I340 Nonrheumatic mitral (valve) insufficiency: Secondary | ICD-10-CM | POA: Insufficient documentation

## 2016-05-20 DIAGNOSIS — I5022 Chronic systolic (congestive) heart failure: Secondary | ICD-10-CM | POA: Diagnosis not present

## 2016-05-20 DIAGNOSIS — E785 Hyperlipidemia, unspecified: Secondary | ICD-10-CM | POA: Diagnosis not present

## 2016-05-20 DIAGNOSIS — I5021 Acute systolic (congestive) heart failure: Secondary | ICD-10-CM | POA: Diagnosis present

## 2016-05-20 LAB — ECHOCARDIOGRAM COMPLETE
AOASC: 27 cm
CHL CUP TV REG PEAK VELOCITY: 233 cm/s
FS: 22 % — AB (ref 28–44)
IVS/LV PW RATIO, ED: 0.94
LA ID, A-P, ES: 34 mm
LA vol A4C: 62 ml
LADIAMINDEX: 1.87 cm/m2
LAVOL: 55.9 mL
LAVOLIN: 30.8 mL/m2
LEFT ATRIUM END SYS DIAM: 34 mm
LVOT SV: 73 mL
LVOT VTI: 23.4 cm
LVOT area: 3.14 cm2
LVOT diameter: 20 mm
LVOT peak vel: 108 cm/s
PW: 6.88 mm — AB (ref 0.6–1.1)
RV sys press: 30 mmHg
TR max vel: 233 cm/s

## 2016-05-21 LAB — BASIC METABOLIC PANEL
BUN: 19 mg/dL (ref 7–25)
CALCIUM: 9.5 mg/dL (ref 8.6–10.4)
CO2: 27 mmol/L (ref 20–31)
CREATININE: 0.88 mg/dL (ref 0.50–0.99)
Chloride: 102 mmol/L (ref 98–110)
Glucose, Bld: 87 mg/dL (ref 65–99)
Potassium: 5 mmol/L (ref 3.5–5.3)
Sodium: 138 mmol/L (ref 135–146)

## 2016-05-23 ENCOUNTER — Encounter (HOSPITAL_COMMUNITY)
Admission: RE | Admit: 2016-05-23 | Discharge: 2016-05-23 | Disposition: A | Discharge status: Home / Self Care | Payer: Medicare Other | Source: Ambulatory Visit | Attending: Cardiology | Admitting: Cardiology

## 2016-05-23 DIAGNOSIS — I701 Atherosclerosis of renal artery: Secondary | ICD-10-CM | POA: Diagnosis not present

## 2016-05-23 DIAGNOSIS — Z951 Presence of aortocoronary bypass graft: Secondary | ICD-10-CM

## 2016-05-23 DIAGNOSIS — I2510 Atherosclerotic heart disease of native coronary artery without angina pectoris: Secondary | ICD-10-CM | POA: Diagnosis not present

## 2016-05-23 DIAGNOSIS — Z7984 Long term (current) use of oral hypoglycemic drugs: Secondary | ICD-10-CM | POA: Diagnosis not present

## 2016-05-23 DIAGNOSIS — Z87891 Personal history of nicotine dependence: Secondary | ICD-10-CM | POA: Diagnosis not present

## 2016-05-23 DIAGNOSIS — Z7982 Long term (current) use of aspirin: Secondary | ICD-10-CM | POA: Insufficient documentation

## 2016-05-23 DIAGNOSIS — E785 Hyperlipidemia, unspecified: Secondary | ICD-10-CM | POA: Insufficient documentation

## 2016-05-23 DIAGNOSIS — Z79899 Other long term (current) drug therapy: Secondary | ICD-10-CM | POA: Insufficient documentation

## 2016-05-23 DIAGNOSIS — I213 ST elevation (STEMI) myocardial infarction of unspecified site: Secondary | ICD-10-CM

## 2016-05-25 ENCOUNTER — Encounter (HOSPITAL_COMMUNITY)
Admission: RE | Admit: 2016-05-25 | Discharge: 2016-05-25 | Disposition: A | Discharge status: Home / Self Care | Payer: Medicare Other | Source: Ambulatory Visit | Attending: Cardiology | Admitting: Cardiology

## 2016-05-25 DIAGNOSIS — E785 Hyperlipidemia, unspecified: Secondary | ICD-10-CM | POA: Diagnosis not present

## 2016-05-25 DIAGNOSIS — Z7982 Long term (current) use of aspirin: Secondary | ICD-10-CM | POA: Diagnosis not present

## 2016-05-25 DIAGNOSIS — Z79899 Other long term (current) drug therapy: Secondary | ICD-10-CM | POA: Diagnosis not present

## 2016-05-25 DIAGNOSIS — Z951 Presence of aortocoronary bypass graft: Secondary | ICD-10-CM | POA: Diagnosis not present

## 2016-05-25 DIAGNOSIS — I213 ST elevation (STEMI) myocardial infarction of unspecified site: Secondary | ICD-10-CM | POA: Diagnosis not present

## 2016-05-25 DIAGNOSIS — Z7984 Long term (current) use of oral hypoglycemic drugs: Secondary | ICD-10-CM | POA: Diagnosis not present

## 2016-05-27 ENCOUNTER — Encounter: Payer: Self-pay | Admitting: Cardiology

## 2016-05-27 ENCOUNTER — Encounter (HOSPITAL_COMMUNITY)
Admission: RE | Admit: 2016-05-27 | Discharge: 2016-05-27 | Disposition: A | Discharge status: Home / Self Care | Payer: Medicare Other | Source: Ambulatory Visit | Attending: Cardiology | Admitting: Cardiology

## 2016-05-27 DIAGNOSIS — I213 ST elevation (STEMI) myocardial infarction of unspecified site: Secondary | ICD-10-CM

## 2016-05-27 DIAGNOSIS — Z7982 Long term (current) use of aspirin: Secondary | ICD-10-CM | POA: Diagnosis not present

## 2016-05-27 DIAGNOSIS — E785 Hyperlipidemia, unspecified: Secondary | ICD-10-CM | POA: Diagnosis not present

## 2016-05-27 DIAGNOSIS — Z951 Presence of aortocoronary bypass graft: Secondary | ICD-10-CM

## 2016-05-27 DIAGNOSIS — Z7984 Long term (current) use of oral hypoglycemic drugs: Secondary | ICD-10-CM | POA: Diagnosis not present

## 2016-05-27 DIAGNOSIS — Z79899 Other long term (current) drug therapy: Secondary | ICD-10-CM | POA: Diagnosis not present

## 2016-05-27 NOTE — Progress Notes (Signed)
Hailey Wright 70 y.o. female Nutrition Note Spoke with pt.  Nutrition Survey reviewed with pt. Pt is following Step 2 of the Therapeutic Lifestyle Changes diet. Pt with dx of CHF. P reports she was told to "not consume any salt." Low sodium diet discussed. Pt is taking Metformin without noted dx of DM. Per discussion, pt was "borderline a couple of years ago so Metformin was ordered." Pt has a family h/o DM (pt's father). Pt plans on discussing whether or not she continues to need Metformin given 24 lb wt loss over the past 3 years, and 15 lb wt loss over the past year. Pt was not actively trying to lose wt. Pt states the wt loss was due to dietary changes made. Pt does not check CBG's at home. Pt expressed understanding of the information reviewed via feedback method. Pt aware of nutrition education classes offered and plans on attending nutrition classes. No results found for: HGBA1C Wt Readings from Last 3 Encounters:  05/03/16 154 lb (69.854 kg)  04/29/16 154 lb 6 oz (70.024 kg)  04/26/16 156 lb 4.9 oz (70.9 kg)   Nutrition Diagnosis ? Food-and nutrition-related knowledge deficit related to lack of exposure to information as related to diagnosis of: ? CVD Nutrition Intervention ? Benefits of adopting Therapeutic Lifestyle Changes discussed when Med[employer]s reviewed. ? Pt to attend the Portion Distortion class ? Pt to attend the  ? Nutrition I class                     ? Nutrition II class ? Continue client-centered nutrition education by RD, as part of interdisciplinary care.  Goal(s) ? Pt to describe the benefit of including fruits, vegetables, whole grains, and low-fat dairy products in a heart healthy meal plan.  Monitor and Evaluate progress toward nutrition goal with team.  Derek Mound, M.Ed, RD, LDN, CDE 05/27/2016 10:29 AM

## 2016-05-30 ENCOUNTER — Encounter (HOSPITAL_COMMUNITY)
Admission: RE | Admit: 2016-05-30 | Discharge: 2016-05-30 | Disposition: A | Discharge status: Home / Self Care | Payer: Medicare Other | Source: Ambulatory Visit | Attending: Cardiology | Admitting: Cardiology

## 2016-05-30 DIAGNOSIS — Z951 Presence of aortocoronary bypass graft: Secondary | ICD-10-CM

## 2016-05-30 DIAGNOSIS — I213 ST elevation (STEMI) myocardial infarction of unspecified site: Secondary | ICD-10-CM

## 2016-05-30 DIAGNOSIS — Z79899 Other long term (current) drug therapy: Secondary | ICD-10-CM | POA: Diagnosis not present

## 2016-05-30 DIAGNOSIS — Z7982 Long term (current) use of aspirin: Secondary | ICD-10-CM | POA: Diagnosis not present

## 2016-05-30 DIAGNOSIS — Z7984 Long term (current) use of oral hypoglycemic drugs: Secondary | ICD-10-CM | POA: Diagnosis not present

## 2016-05-30 DIAGNOSIS — E785 Hyperlipidemia, unspecified: Secondary | ICD-10-CM | POA: Diagnosis not present

## 2016-06-01 ENCOUNTER — Encounter (HOSPITAL_COMMUNITY)
Admission: RE | Admit: 2016-06-01 | Discharge: 2016-06-01 | Disposition: A | Discharge status: Home / Self Care | Payer: Medicare Other | Source: Ambulatory Visit | Attending: Cardiology | Admitting: Cardiology

## 2016-06-01 DIAGNOSIS — Z7982 Long term (current) use of aspirin: Secondary | ICD-10-CM | POA: Diagnosis not present

## 2016-06-01 DIAGNOSIS — Z7984 Long term (current) use of oral hypoglycemic drugs: Secondary | ICD-10-CM | POA: Diagnosis not present

## 2016-06-01 DIAGNOSIS — Z951 Presence of aortocoronary bypass graft: Secondary | ICD-10-CM | POA: Diagnosis not present

## 2016-06-01 DIAGNOSIS — I213 ST elevation (STEMI) myocardial infarction of unspecified site: Secondary | ICD-10-CM

## 2016-06-01 DIAGNOSIS — E785 Hyperlipidemia, unspecified: Secondary | ICD-10-CM | POA: Diagnosis not present

## 2016-06-01 DIAGNOSIS — Z79899 Other long term (current) drug therapy: Secondary | ICD-10-CM | POA: Diagnosis not present

## 2016-06-01 NOTE — Progress Notes (Signed)
Cardiac Individual Treatment Plan  Patient Details  Name: Hailey Wright MRN: [id number] Date of Birth: 1946-07-06 Referring Provider:        CARDIAC REHAB PHASE II ORIENTATION from 04/26/2016 in [place]   Referring Provider  Minus Breeding MD      Initial Encounter Date:       CARDIAC REHAB PHASE II ORIENTATION from 04/26/2016 in [place]   Date  04/26/16   Referring Provider  Minus Breeding MD      Visit Diagnosis: ST elevation (STEMI) myocardial infarction of unspecified site ([place])  S/P CABG x 2  Patient's Home Medications on Admission:  Current outpatient prescriptions:  .  aspirin 81 MG tablet, Take 81 mg by mouth daily., Disp: , Rfl:  .  atorvastatin (LIPITOR) 80 MG tablet, Take 80 mg by mouth daily., Disp: , Rfl:  .  calcium-vitamin D (OSCAL WITH D) 500-200 MG-UNIT per tablet, Take 1 tablet by mouth 2 (two) times daily., Disp: , Rfl:  .  carvedilol (COREG) 3.125 MG tablet, Take 1 tablet (3.125 mg total) by mouth 2 (two) times daily., Disp: 180 tablet, Rfl: 0 .  clopidogrel (PLAVIX) 75 MG tablet, Take 1 tablet (75 mg total) by mouth daily., Disp: 30 tablet, Rfl: 6 .  Co-Enzyme Q10 200 MG CAPS, Take 1 capsule by mouth daily. , Disp: , Rfl:  .  lisinopril (PRINIVIL,ZESTRIL) 2.5 MG tablet, Take 1 tablet (2.5 mg total) by mouth daily., Disp: 30 tablet, Rfl: 3 .  Magnesium 500 MG TABS, Take 1 tablet by mouth daily. , Disp: , Rfl:  .  metFORMIN (GLUCOPHAGE-XR) 500 MG 24 hr tablet, Take 1 tablet by mouth every evening. , Disp: , Rfl:  .  Multiple Vitamin (MULTIVITAMIN) tablet, Take 1 tablet by mouth daily., Disp: , Rfl:   Past Medical History: Past Medical History  Diagnosis Date  . Hyperlipidemia   . MI (myocardial infarction) ([place])     Inferior treated with TPA 1991, POBA RCA 1996  . RAS (renal artery stenosis) ([place])     Right renal stent 2005  . Unstable angina ([place]) 02/17/2016  . CAD (coronary artery  disease) 06/04/2013  . Acute ST elevation myocardial infarction (STEMI) involving left anterior descending coronary artery ([place]) 02/18/2016  . Acute ST elevation myocardial infarction (STEMI) involving left circumflex coronary artery ([place]) 02/18/2016  . Coronary artery thrombosis ([place]) 02/18/2016  . S/P Emergency CABG x 2 02/18/2016    LIMA to LAD, SVG to OM1, EVH via right thigh  . Ischemic cardiomyopathy     a. Echo 5/17: EF 25-30%, anteroseptal, anterior, anteroseptal and apical akinesis, possible inferior hypokinesis, grade 1 diastolic dysfunction, no evidence of thrombus, trivial AI, mild MR, mild LAE    Tobacco Use: History  Smoking status  . Former Smoker -- 1.00 packs/day for 40 years  . Types: Cigarettes  . Quit date: 06/05/2011  Smokeless tobacco  . 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 Latest Ref Rng 02/19/2016 02/19/2016 02/19/2016 02/19/2016 02/20/2016   PHART 7.350 - 7.450 7.405 7.331(L) 7.330(L) - -   PCO2ART 35.0 - 45.0 mmHg 38.5 44.0 42.9 - -   HCO3 20.0 - 24.0 mEq/L 24.0 23.1 22.6 - -   TCO2 0 - 100 mmol/L 25 24 24 23 24    ACIDBASEDEF 0.0 - 2.0 mmol/L 1.0 3.0(H) 3.0(H) - -   O2SAT -  97.0 96.0 93.0 - -      Capillary Blood Glucose: Lab Results  Component Value Date   GLUCAP 89 05/09/2016   GLUCAP 86 05/06/2016   GLUCAP 87 05/04/2016   GLUCAP 77 05/04/2016   GLUCAP 91 05/04/2016     Exercise Target Goals:    Exercise Program Goal: Individual exercise prescription set with THRR, safety & activity barriers. Participant demonstrates ability to understand and report RPE using BORG scale, to self-measure pulse accurately, and to acknowledge the importance of the exercise prescription.  Exercise Prescription Goal: Starting with aerobic activity 30 plus minutes a day, 3 days per week for initial exercise prescription. Provide home exercise prescription and guidelines that participant acknowledges  understanding prior to discharge.  Activity Barriers & Risk Stratification:     Activity Barriers & Cardiac Risk Stratification - 04/26/16 1506    Activity Barriers & Cardiac Risk Stratification   Activity Barriers None   Cardiac Risk Stratification High      6 Minute Walk:     6 Minute Walk      04/26/16 1654       6 Minute Walk   Phase Initial     Distance 1700 feet     Walk Time 6 minutes     # of Rest Breaks 0     MPH 3.22     METS 3.65     RPE 13     VO2 Peak 12.79     Symptoms No     Resting HR 71 bpm     Resting BP 102/68 mmHg     Max Ex. HR 85 bpm     Max Ex. BP 118/80 mmHg     2 Minute Post BP 110/60 mmHg        Initial Exercise Prescription:     Initial Exercise Prescription - 04/26/16 1600    Date of Initial Exercise RX and Referring Provider   Date 04/26/16   Referring Provider Minus Breeding MD   Treadmill   MPH 2.8   Grade 1   Minutes 10   METs 3.53   Bike   Level 1   Minutes 10   METs 3.7   NuStep   Level 3   Minutes 10   METs 2.8   Prescription Details   Frequency (times per week) 3   Duration Progress to 30 minutes of continuous aerobic without signs/symptoms of physical distress   Intensity   THRR 40-80% of Max Heartrate 61-122 bpm   Ratings of Perceived Exertion 11-13   Perceived Dyspnea 0-4   Progression   Progression Continue to progress workloads to maintain intensity without signs/symptoms of physical distress.   Resistance Training   Training Prescription Yes   Weight 2 lb   Reps 10-12      Perform Capillary Blood Glucose checks as needed.  Exercise Prescription Changes:      Exercise Prescription Changes      05/23/16 0900 06/02/16 1600         Exercise Review   Progression Yes Yes      Response to Exercise   Blood Pressure (Admit) 104/68 mmHg 98/66 mmHg      Blood Pressure (Exercise) 104/70 mmHg 124/74 mmHg      Blood Pressure (Exit) 98/62 mmHg 98/68 mmHg      Heart Rate (Admit) 67 bpm 67 bpm       Heart Rate (Exercise) 100 bpm 99 bpm      Heart Rate (Exit) 63 bpm  65 bpm      Rating of Perceived Exertion (Exercise) 12 13      Duration Progress to 30 minutes of continuous aerobic without signs/symptoms of physical distress Progress to 30 minutes of continuous aerobic without signs/symptoms of physical distress      Intensity THRR unchanged THRR unchanged      Progression   Progression Continue to progress workloads to maintain intensity without signs/symptoms of physical distress. Continue to progress workloads to maintain intensity without signs/symptoms of physical distress.      Average METs 2.8 3.5      Resistance Training   Training Prescription Yes Yes      Weight 3lbs 3lbs      Reps 10-12 10-12      Treadmill   MPH 3 3      Grade 2 2      Minutes 10 10      METs 4.12 4.12      Bike   Level 1 1      Minutes 10 10      METs 3.7 3.7      NuStep   Level 4 4      Minutes 10 10      METs 3 3.4      Home Exercise Plan   Plans to continue exercise at [facility] (comment)  Reviewed on 05/11/16. Plans to go to [facility] (comment)  Reviewed on 05/11/16. Plans to go to [facility]      Frequency Add 2 additional days to program exercise sessions. Add 2 additional days to program exercise sessions.         Exercise Comments:      Exercise Comments      06/02/16 1628           Exercise Comments Reviewed METs and goals. Pt is tolerating exercise well; will continue to monitor exercise progression          Discharge Exercise Prescription (Final Exercise Prescription Changes):     Exercise Prescription Changes - 06/02/16 1600    Exercise Review   Progression Yes   Response to Exercise   Blood Pressure (Admit) 98/66 mmHg   Blood Pressure (Exercise) 124/74 mmHg   Blood Pressure (Exit) 98/68 mmHg   Heart Rate (Admit) 67 bpm   Heart Rate (Exercise) 99 bpm   Heart Rate (Exit) 65 bpm   Rating of Perceived Exertion (Exercise) 13   Duration  Progress to 30 minutes of continuous aerobic without signs/symptoms of physical distress   Intensity THRR unchanged   Progression   Progression Continue to progress workloads to maintain intensity without signs/symptoms of physical distress.   Average METs 3.5   Resistance Training   Training Prescription Yes   Weight 3lbs   Reps 10-12   Treadmill   MPH 3   Grade 2   Minutes 10   METs 4.12   Bike   Level 1   Minutes 10   METs 3.7   NuStep   Level 4   Minutes 10   METs 3.4   Home Exercise Plan   Plans to continue exercise at [facility] (comment)  Reviewed on 05/11/16. Plans to go to [facility]   Frequency Add 2 additional days to program exercise sessions.      Nutrition:  Target Goals: Understanding of nutrition guidelines, daily intake of sodium 1500mg , cholesterol 200mg , calories 30% from fat and 7% or less from saturated fats, daily to have 5 or  more servings of fruits and vegetables.  Biometrics:     Pre Biometrics - 04/26/16 1656    Pre Biometrics   Height 5\' 7"  (1.702 m)   Weight 156 lb 4.9 oz (70.9 kg)   Waist Circumference 32 inches   Hip Circumference 39.5 inches   Waist to Hip Ratio 0.81 %   BMI (Calculated) 24.5   Triceps Skinfold 33.5 mm   % Body Fat 36.8 %   Grip Strength 34.5 kg   Flexibility 12.25 in   Single Leg Stand 30 seconds       Nutrition Therapy Plan and Nutrition Goals:     Nutrition Therapy & Goals - 04/28/16 1018    Nutrition Therapy   Diet Carb Modified, Therapeutic Lifestyle Changes    Personal Nutrition Goals   Personal Goal #1 Maintain wt around 156 lb while in [place], educate and counsel regarding individualized specific dietary modifications aiming towards targeted core components such as weight, hypertension, lipid management, diabetes, heart failure and other comorbidities.   Expected Outcomes Short Term Goal: Understand basic principles of dietary content,  such as calories, fat, sodium, cholesterol and nutrients.;Long Term Goal: Adherence to prescribed nutrition plan.      Nutrition Discharge: Nutrition Scores:     Nutrition Assessments - 04/28/16 1019    MEDFICTS Scores   Pre Score 0      Nutrition Goals Re-Evaluation:   Psychosocial: Target Goals: Acknowledge presence or absence of depression, maximize coping skills, provide positive support system. Participant is able to verbalize types and ability to use techniques and skills needed for reducing stress and depression.  Initial Review & Psychosocial Screening:     Initial Psych Review & Screening - 05/02/16 1002    Family Dynamics   Good Support System? Yes   Barriers   Psychosocial barriers to participate in program There are no identifiable barriers or psychosocial needs.   Screening Interventions   Interventions Encouraged to exercise      Quality of Life Scores:     Quality of Life - 04/26/16 1700    Quality of Life Scores   Health/Function Pre 26.89 %   Socioeconomic Pre 30 %   Psych/Spiritual Pre 28.29 %   Family Pre 30 %   GLOBAL Pre 28.27 %      PHQ-9:     Recent Review Flowsheet Data    Depression screen [facility] 2/9 05/02/2016   Decreased Interest 0   Down, Depressed, Hopeless 0   PHQ - 2 Score 0      Psychosocial Evaluation and Intervention:     Psychosocial Evaluation - 06/01/16 1730    Psychosocial Evaluation & Interventions   Interventions Stress management education;Relaxation education;Encouraged to exercise with the program and follow exercise prescription   Comments pt is exercising on her own at home. pt states she feels more comfortable and confident with exercising on her own.    Continued Psychosocial Services Needed Yes      Psychosocial Re-Evaluation:   Vocational Rehabilitation: Provide vocational rehab assistance to qualifying candidates.   Vocational Rehab Evaluation & Intervention:     Vocational Rehab - 04/26/16 1541     Initial Vocational Rehab Evaluation & Intervention   Assessment shows need for Vocational Rehabilitation No      Education: Education Goals: Education classes will be provided on a weekly basis, covering required topics. Participant will state understanding/return demonstration of topics presented.  Learning Barriers/Preferences:  Learning Barriers/Preferences - 04/26/16 1540    Learning Barriers/Preferences   Learning Barriers None   Learning Preferences Group Instruction;Skilled Demonstration      Education Topics: Count Your Pulse:  -Group instruction provided by verbal instruction, demonstration, patient participation and written materials to support subject.  Instructors address importance of being able to find your pulse and how to count your pulse when at home without a heart monitor.  Patients get hands on experience counting their pulse with staff help and individually.      CARDIAC REHAB PHASE II EXERCISE from 06/01/2016 in [place]   Date  05/27/16   Educator  Maurice Small RN   Instruction Review Code  2- meets goals/outcomes      Heart Attack, Angina, and Risk Factor Modification:  -Group instruction provided by verbal instruction, video, and written materials to support subject.  Instructors address signs and symptoms of angina and heart attacks.    Also discuss risk factors for heart disease and how to make changes to improve heart health risk factors.   Functional Fitness:  -Group instruction provided by verbal instruction, demonstration, patient participation, and written materials to support subject.  Instructors address safety measures for doing things around the house.  Discuss how to get up and down off the floor, how to pick things up properly, how to safely get out of a chair without assistance, and balance training.      CARDIAC REHAB PHASE II EXERCISE from 06/01/2016 in [place]    Date  05/20/16   Instruction Review Code  2- meets goals/outcomes      Meditation and Mindfulness:  -Group instruction provided by verbal instruction, patient participation, and written materials to support subject.  Instructor addresses importance of mindfulness and meditation practice to help reduce stress and improve awareness.  Instructor also leads participants through a meditation exercise.       CARDIAC REHAB PHASE II EXERCISE from 06/01/2016 in [place]   Date  05/25/16   Instruction Review Code  2- meets goals/outcomes      Stretching for Flexibility and Mobility:  -Group instruction provided by verbal instruction, patient participation, and written materials to support subject.  Instructors lead participants through series of stretches that are designed to increase flexibility thus improving mobility.  These stretches are additional exercise for major muscle groups that are typically performed during regular warm up and cool down.      CARDIAC REHAB PHASE II EXERCISE from 06/01/2016 in [place]   Date  05/11/16   Instruction Review Code  2- meets goals/outcomes      Hands Only CPR Anytime:  -Group instruction provided by verbal instruction, video, patient participation and written materials to support subject.  Instructors co-teach with AHA video for hands only CPR.  Participants get hands on experience with mannequins.      CARDIAC REHAB PHASE II EXERCISE from 06/01/2016 in [place]   Date  05/06/16   Instruction Review Code  2- meets goals/outcomes      Nutrition I class: Heart Healthy Eating:  -Group instruction provided by PowerPoint slides, verbal discussion, and written materials to support subject matter. The instructor gives an explanation and review of the Therapeutic Lifestyle Changes diet recommendations, which includes a discussion on lipid goals, dietary fat, sodium,  fiber, plant stanol/sterol esters, sugar, and the components of a well-balanced, healthy diet.  Nutrition II class: Lifestyle Skills:  -Group instruction provided by PowerPoint slides, verbal discussion, and written materials to support subject matter. The instructor gives an explanation and review of label reading, grocery shopping for heart health, heart healthy recipe modifications, and ways to make healthier choices when eating out.   Diabetes Question & Answer:  -Group instruction provided by PowerPoint slides, verbal discussion, and written materials to support subject matter. The instructor gives an explanation and review of diabetes co-morbidities, pre- and post-prandial blood glucose goals, pre-exercise blood glucose goals, signs, symptoms, and treatment of hypoglycemia and hyperglycemia, and foot care basics.   Diabetes Blitz:  -Group instruction provided by PowerPoint slides, verbal discussion, and written materials to support subject matter. The instructor gives an explanation and review of the physiology behind type 1 and type 2 diabetes, diabetes medications and rational behind using different medications, pre- and post-prandial blood glucose recommendations and Hemoglobin A1c goals, diabetes diet, and exercise including blood glucose guidelines for exercising safely.    Portion Distortion:  -Group instruction provided by PowerPoint slides, verbal discussion, written materials, and food models to support subject matter. The instructor gives an explanation of serving size versus portion size, changes in portions sizes over the last 20 years, and what consists of a serving from each food group.      CARDIAC REHAB PHASE II EXERCISE from 06/01/2016 in [place]   Date  05/18/16   Educator  RD   Instruction Review Code  2- meets goals/outcomes      Stress Management:  -Group instruction provided by verbal instruction, video, and written materials to  support subject matter.  Instructors review role of stress in heart disease and how to cope with stress positively.     Exercising on Your Own:  -Group instruction provided by verbal instruction, power point, and written materials to support subject.  Instructors discuss benefits of exercise, components of exercise, frequency and intensity of exercise, and end points for exercise.  Also discuss use of nitroglycerin and activating EMS.  Review options of places to exercise outside of rehab.  Review guidelines for sex with heart disease.   Cardiac Drugs I:  -Group instruction provided by verbal instruction and written materials to support subject.  Instructor reviews cardiac drug classes: antiplatelets, anticoagulants, beta blockers, and statins.  Instructor discusses reasons, side effects, and lifestyle considerations for each drug class.      CARDIAC REHAB PHASE II EXERCISE from 06/01/2016 in [place]   Date  05/04/16   Educator  Pharm D   Instruction Review Code  2- meets goals/outcomes      Cardiac Drugs II:  -Group instruction provided by verbal instruction and written materials to support subject.  Instructor reviews cardiac drug classes: angiotensin converting enzyme inhibitors (ACE-I), angiotensin II receptor blockers (ARBs), nitrates, and calcium channel blockers.  Instructor discusses reasons, side effects, and lifestyle considerations for each drug class.          CARDIAC REHAB PHASE II EXERCISE from 06/01/2016 in [place]   Date  06/01/16   Instruction Review Code  2- meets goals/outcomes      Anatomy and Physiology of the Circulatory System:  -Group instruction provided by verbal instruction, video, and written materials to support subject.  Reviews functional anatomy of heart, how it relates to various diagnoses, and what role the heart plays in the overall system.   Knowledge Questionnaire Score:  Knowledge Questionnaire Score - 04/26/16 1655    Knowledge Questionnaire Score   Pre Score 23/24      Core Components/Risk Factors/Patient Goals at Admission:     Personal Goals and Risk Factors at Admission - 04/26/16 1541    Core Components/Risk Factors/Patient Goals on Admission   Diabetes Yes   Intervention Provide education about proper nutrition, including hydration, and aerobic/resistive exercise prescription along with prescribed medications to achieve blood glucose in normal ranges: Fasting glucose 65-99 mg/dL   Expected Outcomes Short Term: Participant verbalizes understanding of the signs/symptoms and immediate care of hyper/hypoglycemia, proper foot care and importance of medication, aerobic/resistive exercise and nutrition plan for blood glucose control.   Lipids Yes   Expected Outcomes Short Term: Participant states understanding of desired cholesterol values and is compliant with medications prescribed. Participant is following exercise prescription and nutrition guidelines.;Long Term: Cholesterol controlled with medications as prescribed, with individualized exercise RX and with personalized nutrition plan. Value goals: LDL < 70mg , HDL > 40 mg.      Core Components/Risk Factors/Patient Goals Review:      Goals and Risk Factor Review      06/02/16 1629           Core Components/Risk Factors/Patient Goals Review   Personal Goals Review Other       Review Feeling good overall; really enjoy the nutrition classes and is getting good nutrition information       Expected Outcomes Pt will take nutrition information and apply to lifestyle to live a more heart healthy life          Core Components/Risk Factors/Patient Goals at Discharge (Final Review):      Goals and Risk Factor Review - 06/02/16 1629    Core Components/Risk Factors/Patient Goals Review   Personal Goals Review Other   Review Feeling good overall; really enjoy the nutrition classes and is getting good  nutrition information   Expected Outcomes Pt will take nutrition information and apply to lifestyle to live a more heart healthy life      ITP Comments:     ITP Comments      04/26/16 1539           ITP Comments Dr. Fransico Him, Medical Director           Comments: Pt is making expected progress toward personal goals after completing 14  sessions. Recommend continued exercise and life style modification education including  stress management and relaxation techniques to decrease cardiac risk profile.

## 2016-06-03 ENCOUNTER — Encounter (HOSPITAL_COMMUNITY): Admission: RE | Admit: 2016-06-03 | Payer: Medicare Other | Source: Ambulatory Visit

## 2016-06-06 ENCOUNTER — Encounter (HOSPITAL_COMMUNITY): Payer: Medicare Other

## 2016-06-08 ENCOUNTER — Encounter (HOSPITAL_COMMUNITY): Payer: Medicare Other

## 2016-06-10 ENCOUNTER — Encounter (HOSPITAL_COMMUNITY)
Admission: RE | Admit: 2016-06-10 | Discharge: 2016-06-10 | Disposition: A | Discharge status: Home / Self Care | Payer: Medicare Other | Source: Ambulatory Visit | Attending: Cardiology | Admitting: Cardiology

## 2016-06-10 DIAGNOSIS — Z951 Presence of aortocoronary bypass graft: Secondary | ICD-10-CM

## 2016-06-10 DIAGNOSIS — Z7982 Long term (current) use of aspirin: Secondary | ICD-10-CM | POA: Diagnosis not present

## 2016-06-10 DIAGNOSIS — E785 Hyperlipidemia, unspecified: Secondary | ICD-10-CM | POA: Diagnosis not present

## 2016-06-10 DIAGNOSIS — Z79899 Other long term (current) drug therapy: Secondary | ICD-10-CM | POA: Diagnosis not present

## 2016-06-10 DIAGNOSIS — I213 ST elevation (STEMI) myocardial infarction of unspecified site: Secondary | ICD-10-CM

## 2016-06-10 DIAGNOSIS — Z7984 Long term (current) use of oral hypoglycemic drugs: Secondary | ICD-10-CM | POA: Diagnosis not present

## 2016-06-13 ENCOUNTER — Encounter (HOSPITAL_COMMUNITY)
Admission: RE | Admit: 2016-06-13 | Discharge: 2016-06-13 | Disposition: A | Discharge status: Home / Self Care | Payer: Medicare Other | Source: Ambulatory Visit | Attending: Cardiology | Admitting: Cardiology

## 2016-06-13 DIAGNOSIS — E785 Hyperlipidemia, unspecified: Secondary | ICD-10-CM | POA: Diagnosis not present

## 2016-06-13 DIAGNOSIS — I213 ST elevation (STEMI) myocardial infarction of unspecified site: Secondary | ICD-10-CM

## 2016-06-13 DIAGNOSIS — Z951 Presence of aortocoronary bypass graft: Secondary | ICD-10-CM

## 2016-06-13 DIAGNOSIS — Z7982 Long term (current) use of aspirin: Secondary | ICD-10-CM | POA: Diagnosis not present

## 2016-06-13 DIAGNOSIS — Z7984 Long term (current) use of oral hypoglycemic drugs: Secondary | ICD-10-CM | POA: Diagnosis not present

## 2016-06-13 DIAGNOSIS — Z79899 Other long term (current) drug therapy: Secondary | ICD-10-CM | POA: Diagnosis not present

## 2016-06-14 NOTE — Progress Notes (Signed)
Electrophysiology Office Note   Date:  06/15/2016   ID:  Hailey Wright, DOB Feb 25, 1946, MRN [id number]  PCP:  Hailey Lopes, MD  Cardiologist:  Hochrein Primary Electrophysiologist:  Destane Speas Hailey Leeds, MD    Chief Complaint  Patient presents with  . Follow-up    discuss ICD     History of Present Illness: Hailey Wright is a 70 y.o. female who presents today for electrophysiology evaluation.   Hx CAD and ischemic cardiomyopathy. In March 2017, had emergent CABG with LIMA to the LAD and SVG to OM1. She is left with an ejection fraction of approximately 30%. BP meds have been unable to be titrated due to hypotension. She has overall been feeling well without major complaint.  She is having no evidence of further chest pain, shortness of breath, or palpitations. She is able to do all of her daily activities.  Today, she denies symptoms of palpitations, chest pain, PND, lower extremity edema, claudication, dizziness, presyncope, syncope, bleeding, or neurologic sequela. The patient is tolerating medications without difficulties and is otherwise without complaint today.    Past Medical History:  Diagnosis Date  . Acute ST elevation myocardial infarction (STEMI) involving left anterior descending coronary artery ([place]) 02/18/2016  . Acute ST elevation myocardial infarction (STEMI) involving left circumflex coronary artery ([place]) 02/18/2016  . CAD (coronary artery disease) 06/04/2013  . Coronary artery thrombosis ([place]) 02/18/2016  . Hyperlipidemia   . Ischemic cardiomyopathy    a. Echo 5/17: EF 25-30%, anteroseptal, anterior, anteroseptal and apical akinesis, possible inferior hypokinesis, grade 1 diastolic dysfunction, no evidence of thrombus, trivial AI, mild MR, mild LAE  . MI (myocardial infarction) ([place])    Inferior treated with TPA 1991, POBA RCA 1996  . RAS (renal artery stenosis) ([place])    Right renal stent 2005  . S/P Emergency CABG x 2 02/18/2016   LIMA to LAD, SVG to OM1, EVH via  right thigh  . Unstable angina ([place]) 02/17/2016   Past Surgical History:  Procedure Laterality Date  . CARDIAC CATHETERIZATION N/A 02/18/2016   Procedure: Left Heart Cath and Coronary Angiography;  Surgeon: Hailey Man, MD;  Location: [facility];  Service: Cardiovascular;  Laterality: N/A;  . CORONARY ARTERY BYPASS GRAFT N/A 02/18/2016   Procedure: CORONARY ARTERY BYPASS GRAFTING (CABG) times 2 using left internal mammary and right greater saphenous vein;  Surgeon: Hailey Alberts, MD;  Location: [facility];  Service: Open Heart Surgery;  Laterality: N/A;  . RHINOPLASTY    . TONSILLECTOMY AND ADENOIDECTOMY       Current Outpatient Prescriptions  Medication Sig Dispense Refill  . aspirin 81 MG tablet Take 81 mg by mouth daily.    Marland Kitchen atorvastatin (LIPITOR) 80 MG tablet Take 80 mg by mouth daily.    . calcium-vitamin D (OSCAL WITH D) 500-200 MG-UNIT per tablet Take 1 tablet by mouth 2 (two) times daily.    . carvedilol (COREG) 3.125 MG tablet Take 1 tablet (3.125 mg total) by mouth 2 (two) times daily. 180 tablet 0  . clopidogrel (PLAVIX) 75 MG tablet Take 1 tablet (75 mg total) by mouth daily. 30 tablet 6  . Co-Enzyme Q10 200 MG CAPS Take 1 capsule by mouth daily.     Marland Kitchen lisinopril (PRINIVIL,ZESTRIL) 2.5 MG tablet Take 1 tablet (2.5 mg total) by mouth daily. 30 tablet 3  . Magnesium 500 MG TABS Take 1 tablet by mouth daily.     . metFORMIN (GLUCOPHAGE-XR) 500 MG 24 hr tablet Take 1  tablet by mouth every other day.     . Multiple Vitamin (MULTIVITAMIN) tablet Take 1 tablet by mouth daily.     No current facility-administered medications for this visit.     Allergies:   Review of patient's allergies indicates no known allergies.   Social History:  The patient  reports that she quit smoking about 5 years ago. Her smoking use included Cigarettes. She has a 40.00 pack-year smoking history. She has quit using smokeless tobacco. She reports that she does not drink alcohol or use drugs.    Family History:  The patient's family history includes Heart attack (age of onset: 77) in her brother; Heart attack (age of onset: 40) in her father.    ROS:  Please see the history of present illness.   Otherwise, review of systems is positive for dizziness, cough, balance problems.   All other systems are reviewed and negative.    PHYSICAL EXAM: VS:  BP 120/68   Pulse (!) 58   Ht 5\' 7"  (1.702 m)   Wt 152 lb 12.8 oz (69.3 kg)   LMP  (LMP Unknown)   BMI 23.93 kg/m  , BMI Body mass index is 23.93 kg/m. GEN: Well nourished, well developed, in no acute distress  HEENT: normal  Neck: no JVD, carotid bruits, or masses Cardiac: RRR; no murmurs, rubs, or gallops,no edema  Respiratory:  clear to auscultation bilaterally, normal work of breathing GI: soft, nontender, nondistended, + BS MS: no deformity or atrophy  Skin: warm and dry Neuro:  Strength and sensation are intact Psych: euthymic mood, full affect  EKG:  EKG is ordered today. The ekg ordered today shows sinus rhythm, rate 69, anterior Q waves, PVCs, lateral TWI  Recent Labs: 02/18/2016: ALT 50 02/20/2016: Magnesium 2.1 03/17/2016: Hemoglobin 12.1; Platelets 321 05/20/2016: BUN 19; Creat 0.88; Potassium 5.0; Sodium 138    Lipid Panel  No results found for: CHOL, TRIG, HDL, CHOLHDL, VLDL, LDLCALC, LDLDIRECT   Wt Readings from Last 3 Encounters:  06/15/16 152 lb 12.8 oz (69.3 kg)  05/03/16 154 lb (69.9 kg)  04/29/16 154 lb 6 oz (70 kg)      Other studies Reviewed: Additional studies/ records that were reviewed today include:  TTE 05/20/16 - Left ventricle: The cavity size was mildly dilated. Wall   thickness was normal. Systolic function was severely reduced. The   estimated ejection fraction was in the range of 25% to 30%. There   is akinesis of the anteroseptal and apical myocardium. Features   are consistent with a pseudonormal left ventricular filling   pattern, with concomitant abnormal relaxation and  increased   filling pressure (grade 2 diastolic dysfunction). - Aortic valve: There was trivial regurgitation. - Mitral valve: Calcified annulus. Mildly thickened leaflets .   There was mild regurgitation. - Left atrium: The atrium was moderately dilated.   ASSESSMENT AND PLAN:  1.  Ischemic cardiomyopathy: LV found to be 25-30% at the time of cath. Has been on OMT for the last 3 months without improvement in her LVEF.   I discussed with her the options of defibrillator placement, which she is agreeable to. Risks and benefits were discussed. Risks include bleeding, infection, tamponade, and heart block.   Current medicines are reviewed at length with the patient today.   The patient does not have concerns regarding her medicines.  The following changes were made today:  none  Labs/ tests ordered today include:  No orders of the defined types were placed in this  encounter.    Disposition:   FU with Gary Bultman 3 months  Signed, Meric Joye Hailey Leeds, MD  06/15/2016 9:53 AM     [facility] [address] [place] [address] [phone] (office) [phone] (fax)

## 2016-06-15 ENCOUNTER — Ambulatory Visit (INDEPENDENT_AMBULATORY_CARE_PROVIDER_SITE_OTHER): Payer: Medicare Other | Admitting: Cardiology

## 2016-06-15 ENCOUNTER — Encounter: Payer: Self-pay | Admitting: *Deleted

## 2016-06-15 ENCOUNTER — Encounter: Payer: Self-pay | Admitting: Cardiology

## 2016-06-15 ENCOUNTER — Encounter (HOSPITAL_COMMUNITY)
Admission: RE | Admit: 2016-06-15 | Discharge: 2016-06-15 | Disposition: A | Discharge status: Home / Self Care | Payer: Medicare Other | Source: Ambulatory Visit | Attending: Cardiology | Admitting: Cardiology

## 2016-06-15 VITALS — BP 120/68 | HR 58 | Ht 67.0 in | Wt 152.8 lb

## 2016-06-15 DIAGNOSIS — Z01812 Encounter for preprocedural laboratory examination: Secondary | ICD-10-CM | POA: Diagnosis not present

## 2016-06-15 DIAGNOSIS — E785 Hyperlipidemia, unspecified: Secondary | ICD-10-CM | POA: Diagnosis not present

## 2016-06-15 DIAGNOSIS — Z79899 Other long term (current) drug therapy: Secondary | ICD-10-CM | POA: Diagnosis not present

## 2016-06-15 DIAGNOSIS — I200 Unstable angina: Secondary | ICD-10-CM | POA: Diagnosis not present

## 2016-06-15 DIAGNOSIS — Z951 Presence of aortocoronary bypass graft: Secondary | ICD-10-CM | POA: Diagnosis not present

## 2016-06-15 DIAGNOSIS — Z7984 Long term (current) use of oral hypoglycemic drugs: Secondary | ICD-10-CM | POA: Diagnosis not present

## 2016-06-15 DIAGNOSIS — I255 Ischemic cardiomyopathy: Secondary | ICD-10-CM | POA: Diagnosis not present

## 2016-06-15 DIAGNOSIS — I213 ST elevation (STEMI) myocardial infarction of unspecified site: Secondary | ICD-10-CM | POA: Diagnosis not present

## 2016-06-15 DIAGNOSIS — Z7982 Long term (current) use of aspirin: Secondary | ICD-10-CM | POA: Diagnosis not present

## 2016-06-15 LAB — CBC WITH DIFFERENTIAL/PLATELET
BASOS ABS: 66 {cells}/uL (ref 0–200)
Basophils Relative: 1 %
EOS PCT: 3 %
Eosinophils Absolute: 198 cells/uL (ref 15–500)
HCT: 42.9 % (ref 35.0–45.0)
HEMOGLOBIN: 13.9 g/dL (ref 11.7–15.5)
LYMPHS ABS: 2574 {cells}/uL (ref 850–3900)
Lymphocytes Relative: 39 %
MCH: 27.3 pg (ref 27.0–33.0)
MCHC: 32.4 g/dL (ref 32.0–36.0)
MCV: 84.1 fL (ref 80.0–100.0)
MPV: 9.3 fL (ref 7.5–12.5)
Monocytes Absolute: 462 cells/uL (ref 200–950)
Monocytes Relative: 7 %
NEUTROS PCT: 50 %
Neutro Abs: 3300 cells/uL (ref 1500–7800)
Platelets: 202 10*3/uL (ref 140–400)
RBC: 5.1 MIL/uL (ref 3.80–5.10)
RDW: 15.9 % — ABNORMAL HIGH (ref 11.0–15.0)
WBC: 6.6 10*3/uL (ref 3.8–10.8)

## 2016-06-15 LAB — BASIC METABOLIC PANEL
BUN: 16 mg/dL (ref 7–25)
CALCIUM: 9.7 mg/dL (ref 8.6–10.4)
CO2: 26 mmol/L (ref 20–31)
Chloride: 105 mmol/L (ref 98–110)
Creat: 0.94 mg/dL (ref 0.50–0.99)
GLUCOSE: 92 mg/dL (ref 65–99)
Potassium: 5.1 mmol/L (ref 3.5–5.3)
SODIUM: 139 mmol/L (ref 135–146)

## 2016-06-15 NOTE — Patient Instructions (Signed)
Medication Instructions:    Your physician recommends that you continue on your current medications as directed. Please refer to the Current Medication list given to you today.  --- If you need a refill on your cardiac medications before your next appointment, please call your pharmacy. ---   Labwork:  Your physician recommends that you return for pre procedure lab work today: BMET & CBC w/ diff  Testing/Procedures: Your physician has recommended that you have a defibrillator inserted. An implantable cardioverter defibrillator (ICD) is a small device that is placed in your chest or, in rare cases, your abdomen. This device uses electrical pulses or shocks to help control life-threatening, irregular heartbeats that could lead the heart to suddenly stop beating (sudden cardiac arrest). Leads are attached to the ICD that goes into your heart. This is done in the hospital and usually requires an overnight stay. Please see the instruction sheet given to you today for more information.  Follow-Up:  Your physician recommends that you schedule a wound check appointment, after your procedure on 06/16/16, with the device clinic.   Your physician recommends that you schedule a follow up appointment in 3 months, after your procedure on 06/16/16, with Dr. Curt Bears.  Thank you for choosing [facility]!!   Trinidad Curet, RN [phone]  Any Other Special Instructions Will Be Listed Below (If Applicable).   Cardioverter Defibrillator Implantation An implantable cardioverter defibrillator (ICD) is a small, lightweight, battery-powered device that is placed (implanted) under the skin in the chest or abdomen. Your caregiver may prescribe an ICD if:  You have had an irregular heart rhythm (arrhythmia) that originated in the lower chambers of the heart (ventricles).  Your heart has been damaged by a disease (such as coronary artery disease) or heart condition (such as a heart attack). An ICD consists  of a battery that lasts several years, a small computer called a pulse generator, and wires called leads that go into the heart. It is used to detect and correct two dangerous arrhythmias: a rapid heart rhythm (tachycardia) and an arrhythmia in which the ventricles contract in an uncoordinated way (fibrillation). When an ICD detects tachycardia, it sends an electrical signal to the heart that restores the heartbeat to normal (cardioversion). This signal is usually painless. If cardioversion does not work or if the ICD detects fibrillation, it delivers a small electrical shock to the heart (defibrillation) to restart the heart. The shock may feel like a strong jolt in the chest.ICDs may be programmed to correct other problems. Sometimes, ICDs are programmed to act as another type of implantable device called a pacemaker. Pacemakers are used to treat a slow heartbeat (bradycardia). LET YOUR CAREGIVER KNOW ABOUT:  Any allergies you have.  All medicines you are taking, including vitamins, herbs, eyedrops, and over-the-counter medicines and creams.  Previous problems you or members of your family have had with the use of anesthetics.  Any blood disorders you have had.  Other health problems you have. RISKS AND COMPLICATIONS Generally, the procedure to implant an ICD is safe. However, as with any surgical procedure, complications can occur. Possible complications associated with implanting an ICD include:  Swelling, bleeding, or bruising at the site where the ICD was implanted.  Infection at the site where the ICD was implanted.  A reaction to medicine used during the procedure.  Nerve, heart, or blood vessel damage.  Blood clots. BEFORE THE PROCEDURE  You may need to have blood tests, heart tests, or a chest X-ray done before the  day of the procedure.  Ask your caregiver about changing or stopping your regular medicines.  Make plans to have someone drive you home. You may need to stay in  the hospital overnight after the procedure.  Stop smoking at least 24 hours before the procedure.  Take a bath or shower the night before the procedure. You may need to scrub your chest or abdomen with a special type of soap.  Do not eat or drink before your procedure for as long as directed by your caregiver. Ask if it is okay to take any needed medicine with a small sip of water. PROCEDURE  The procedure to implant an ICD in your chest or abdomen is usually done at a hospital in a room that has a large X-ray machine called a fluoroscope. The machine will be above you during the procedure. It will help your caregiver see your heart during the procedure. Implanting an ICD usually takes 1-3 hours. Before the procedure:   Small monitors will be put on your body. They will be used to check your heart, blood pressure, and oxygen level.  A needle will be put into a vein in your hand or arm. This is called an intravenous (IV) access tube. Fluids and medicine will flow directly into your body through the IV tube.  Your chest or abdomen will be cleaned with a germ-killing (antiseptic) solution. The area may be shaved.  You may be given medicine to help you relax (sedative).  You will be given a medicine called a local anesthetic. This medicine will make the surgical site numb while the ICD is implanted. You will be sleepy but awake during the procedure. After you are numb the procedure will begin. The caregiver will:  Make a small cut (incision). This will make a pocket deep under your skin that will hold the pulse generator.  Guide the leads through a large blood vessel into your heart and attach them to the heart muscles. Depending on the ICD, the leads may go into one ventricle or they may go to both ventricles and into an upper chamber of the heart (atrium).  Test the ICD.  Close the incision with stitches, glue, or staples. AFTER THE PROCEDURE  You may feel pain. Some pain is normal. It  may last a few days.  You may stay in a recovery area until the local anesthetic has worn off. Your blood pressure and pulse will be checked often. You will be taken to a room where your heart will be monitored.  A chest X-ray will be taken. This is done to check that the cardioverter defibrillator is in the right place.  You may stay in the hospital overnight.  A slight bump may be seen over the skin where the ICD was placed. Sometimes, it is possible to feel the ICD under the skin. This is normal.  In the months and years afterward, your caregiver will check the device, the leads, and the battery every few months. Eventually, when the battery is low, the ICD will be replaced.   This information is not intended to replace advice given to you by your health care provider. Make sure you discuss any questions you have with your health care provider.   Document Released: 07/30/2002 Document Revised: 08/28/2013 Document Reviewed: 11/26/2012 Elsevier Interactive Patient Education 2016 [employer].    \

## 2016-06-16 ENCOUNTER — Encounter (HOSPITAL_COMMUNITY): Payer: Self-pay | Admitting: *Deleted

## 2016-06-16 ENCOUNTER — Ambulatory Visit (HOSPITAL_COMMUNITY)
Admission: RE | Admit: 2016-06-16 | Discharge: 2016-06-17 | Disposition: A | Discharge status: Home / Self Care | Payer: Medicare Other | Source: Ambulatory Visit | Attending: Cardiology | Admitting: Cardiology

## 2016-06-16 ENCOUNTER — Encounter (HOSPITAL_COMMUNITY)
Admission: RE | Disposition: A | Discharge status: Home / Self Care | Payer: Self-pay | Source: Ambulatory Visit | Attending: Cardiology

## 2016-06-16 DIAGNOSIS — Z7902 Long term (current) use of antithrombotics/antiplatelets: Secondary | ICD-10-CM | POA: Diagnosis not present

## 2016-06-16 DIAGNOSIS — Z951 Presence of aortocoronary bypass graft: Secondary | ICD-10-CM | POA: Diagnosis not present

## 2016-06-16 DIAGNOSIS — I255 Ischemic cardiomyopathy: Secondary | ICD-10-CM | POA: Diagnosis present

## 2016-06-16 DIAGNOSIS — I509 Heart failure, unspecified: Secondary | ICD-10-CM | POA: Diagnosis not present

## 2016-06-16 DIAGNOSIS — Z7984 Long term (current) use of oral hypoglycemic drugs: Secondary | ICD-10-CM | POA: Diagnosis not present

## 2016-06-16 DIAGNOSIS — Z79899 Other long term (current) drug therapy: Secondary | ICD-10-CM | POA: Diagnosis not present

## 2016-06-16 DIAGNOSIS — E785 Hyperlipidemia, unspecified: Secondary | ICD-10-CM | POA: Diagnosis not present

## 2016-06-16 DIAGNOSIS — Z006 Encounter for examination for normal comparison and control in clinical research program: Secondary | ICD-10-CM | POA: Insufficient documentation

## 2016-06-16 DIAGNOSIS — Z7982 Long term (current) use of aspirin: Secondary | ICD-10-CM | POA: Diagnosis not present

## 2016-06-16 DIAGNOSIS — I252 Old myocardial infarction: Secondary | ICD-10-CM | POA: Insufficient documentation

## 2016-06-16 DIAGNOSIS — I2510 Atherosclerotic heart disease of native coronary artery without angina pectoris: Secondary | ICD-10-CM | POA: Diagnosis not present

## 2016-06-16 DIAGNOSIS — Z95818 Presence of other cardiac implants and grafts: Secondary | ICD-10-CM

## 2016-06-16 HISTORY — DX: Hepatitis a without hepatic coma: B15.9

## 2016-06-16 HISTORY — DX: Pneumonia, unspecified organism: J18.9

## 2016-06-16 HISTORY — DX: Type 2 diabetes mellitus without complications: E11.9

## 2016-06-16 HISTORY — DX: Acute embolism and thrombosis of unspecified deep veins of unspecified lower extremity: I82.409

## 2016-06-16 HISTORY — PX: EP IMPLANTABLE DEVICE: SHX172B

## 2016-06-16 HISTORY — DX: Presence of automatic (implantable) cardiac defibrillator: Z95.810

## 2016-06-16 LAB — GLUCOSE, CAPILLARY
GLUCOSE-CAPILLARY: 118 mg/dL — AB (ref 65–99)
Glucose-Capillary: 114 mg/dL — ABNORMAL HIGH (ref 65–99)
Glucose-Capillary: 116 mg/dL — ABNORMAL HIGH (ref 65–99)

## 2016-06-16 LAB — SURGICAL PCR SCREEN
MRSA, PCR: NEGATIVE
STAPHYLOCOCCUS AUREUS: NEGATIVE

## 2016-06-16 SURGERY — ICD IMPLANT

## 2016-06-16 MED ORDER — SODIUM CHLORIDE 0.9 % IR SOLN
80.0000 mg | Status: AC
  Administered 2016-06-16: 80 mg

## 2016-06-16 MED ORDER — CLOPIDOGREL BISULFATE 75 MG PO TABS
75.0000 mg | ORAL_TABLET | Freq: Every day | ORAL | Status: DC
  Administered 2016-06-17: 75 mg via ORAL
  Filled 2016-06-16: qty 1

## 2016-06-16 MED ORDER — MIDAZOLAM HCL 5 MG/5ML IJ SOLN
INTRAMUSCULAR | Status: DC | PRN
  Administered 2016-06-16 (×2): 1 mg via INTRAVENOUS
  Administered 2016-06-16: 2 mg via INTRAVENOUS
  Administered 2016-06-16: 1 mg via INTRAVENOUS

## 2016-06-16 MED ORDER — ACETAMINOPHEN 325 MG PO TABS
325.0000 mg | ORAL_TABLET | ORAL | Status: DC | PRN
  Administered 2016-06-16 – 2016-06-17 (×4): 650 mg via ORAL
  Filled 2016-06-16 (×4): qty 2

## 2016-06-16 MED ORDER — IOPAMIDOL (ISOVUE-370) INJECTION 76%
INTRAVENOUS | Status: DC | PRN
  Administered 2016-06-16: 20 mL via INTRAVENOUS

## 2016-06-16 MED ORDER — INSULIN ASPART 100 UNIT/ML ~~LOC~~ SOLN
0.0000 [IU] | Freq: Three times a day (TID) | SUBCUTANEOUS | Status: DC
Start: 2016-06-16 — End: 2016-06-17

## 2016-06-16 MED ORDER — ASPIRIN EC 81 MG PO TBEC
81.0000 mg | DELAYED_RELEASE_TABLET | Freq: Every day | ORAL | Status: DC
  Administered 2016-06-17: 81 mg via ORAL
  Filled 2016-06-16: qty 1

## 2016-06-16 MED ORDER — LISINOPRIL 2.5 MG PO TABS
2.5000 mg | ORAL_TABLET | Freq: Every day | ORAL | Status: DC
  Administered 2016-06-16: 2.5 mg via ORAL
  Filled 2016-06-16 (×2): qty 1

## 2016-06-16 MED ORDER — CO-ENZYME Q10 200 MG PO CAPS: 1.0000 | ORAL_CAPSULE | Freq: Every day | ORAL | Status: DC

## 2016-06-16 MED ORDER — HEPARIN (PORCINE) IN NACL 2-0.9 UNIT/ML-% IJ SOLN
INTRAMUSCULAR | Status: AC
  Filled 2016-06-16: qty 500

## 2016-06-16 MED ORDER — SODIUM CHLORIDE 0.9 % IR SOLN
Status: AC
  Filled 2016-06-16: qty 2

## 2016-06-16 MED ORDER — FENTANYL CITRATE (PF) 100 MCG/2ML IJ SOLN
INTRAMUSCULAR | Status: DC | PRN
  Administered 2016-06-16 (×2): 25 ug via INTRAVENOUS
  Administered 2016-06-16: 12.5 ug via INTRAVENOUS
  Administered 2016-06-16: 25 ug via INTRAVENOUS

## 2016-06-16 MED ORDER — LIDOCAINE HCL (PF) 1 % IJ SOLN
INTRAMUSCULAR | Status: AC
  Filled 2016-06-16: qty 30

## 2016-06-16 MED ORDER — INSULIN ASPART 100 UNIT/ML ~~LOC~~ SOLN: 0.0000 [IU] | Freq: Every day | SUBCUTANEOUS | Status: DC

## 2016-06-16 MED ORDER — LIDOCAINE HCL (PF) 1 % IJ SOLN
INTRAMUSCULAR | Status: DC | PRN
  Administered 2016-06-16: 58 mL via INTRADERMAL

## 2016-06-16 MED ORDER — CALCIUM CARBONATE-VITAMIN D 500-200 MG-UNIT PO TABS
1.0000 | ORAL_TABLET | Freq: Two times a day (BID) | ORAL | Status: DC
  Administered 2016-06-16 – 2016-06-17 (×2): 1 via ORAL
  Filled 2016-06-16 (×2): qty 1

## 2016-06-16 MED ORDER — HEPARIN (PORCINE) IN NACL 2-0.9 UNIT/ML-% IJ SOLN
INTRAMUSCULAR | Status: DC | PRN
  Administered 2016-06-16: 11:00:00

## 2016-06-16 MED ORDER — FENTANYL CITRATE (PF) 100 MCG/2ML IJ SOLN
INTRAMUSCULAR | Status: AC
  Filled 2016-06-16: qty 2

## 2016-06-16 MED ORDER — ONDANSETRON HCL 4 MG/2ML IJ SOLN: 4.0000 mg | Freq: Four times a day (QID) | INTRAMUSCULAR | Status: DC | PRN

## 2016-06-16 MED ORDER — CARVEDILOL 3.125 MG PO TABS
3.1250 mg | ORAL_TABLET | Freq: Two times a day (BID) | ORAL | Status: DC
  Administered 2016-06-16: 3.125 mg via ORAL
  Filled 2016-06-16 (×2): qty 1

## 2016-06-16 MED ORDER — CEFAZOLIN SODIUM-DEXTROSE 2-4 GM/100ML-% IV SOLN
INTRAVENOUS | Status: AC
  Filled 2016-06-16: qty 100

## 2016-06-16 MED ORDER — IOPAMIDOL (ISOVUE-370) INJECTION 76%
INTRAVENOUS | Status: AC
  Filled 2016-06-16: qty 50

## 2016-06-16 MED ORDER — ATORVASTATIN CALCIUM 80 MG PO TABS
80.0000 mg | ORAL_TABLET | Freq: Every day | ORAL | Status: DC
  Administered 2016-06-16: 80 mg via ORAL
  Filled 2016-06-16 (×2): qty 1

## 2016-06-16 MED ORDER — MAGNESIUM 500 MG PO TABS: 1.0000 | ORAL_TABLET | Freq: Every day | ORAL | Status: DC

## 2016-06-16 MED ORDER — CEFAZOLIN IN D5W 1 GM/50ML IV SOLN
1.0000 g | Freq: Four times a day (QID) | INTRAVENOUS | Status: AC
  Administered 2016-06-16 – 2016-06-17 (×3): 1 g via INTRAVENOUS
  Filled 2016-06-16 (×3): qty 50

## 2016-06-16 MED ORDER — ADULT MULTIVITAMIN W/MINERALS CH
1.0000 | ORAL_TABLET | Freq: Every day | ORAL | Status: DC
  Administered 2016-06-16 – 2016-06-17 (×2): 1 via ORAL
  Filled 2016-06-16 (×4): qty 1

## 2016-06-16 MED ORDER — MUPIROCIN 2 % EX OINT
1.0000 "application " | TOPICAL_OINTMENT | Freq: Once | CUTANEOUS | Status: AC
  Administered 2016-06-16: 1 via TOPICAL

## 2016-06-16 MED ORDER — MIDAZOLAM HCL 5 MG/5ML IJ SOLN
INTRAMUSCULAR | Status: AC
  Filled 2016-06-16: qty 5

## 2016-06-16 MED ORDER — METFORMIN HCL ER 500 MG PO TB24
500.0000 mg | ORAL_TABLET | ORAL | Status: DC
  Administered 2016-06-17: 500 mg via ORAL
  Filled 2016-06-16: qty 1

## 2016-06-16 MED ORDER — MUPIROCIN 2 % EX OINT
TOPICAL_OINTMENT | CUTANEOUS | Status: AC
Start: 2016-06-16 — End: 2016-06-16
  Filled 2016-06-16: qty 22

## 2016-06-16 MED ORDER — CEFAZOLIN SODIUM-DEXTROSE 2-4 GM/100ML-% IV SOLN
2.0000 g | INTRAVENOUS | Status: AC
  Administered 2016-06-16: 2 g via INTRAVENOUS

## 2016-06-16 MED ORDER — SODIUM CHLORIDE 0.9 % IV SOLN
INTRAVENOUS | Status: DC
  Administered 2016-06-16: 10:00:00 via INTRAVENOUS

## 2016-06-16 MED ORDER — METOPROLOL SUCCINATE ER 25 MG PO TB24: 25.0000 mg | ORAL_TABLET | Freq: Every day | ORAL | Status: DC

## 2016-06-16 MED ORDER — SODIUM CHLORIDE 0.9 % IV SOLN
INTRAVENOUS | Status: DC | PRN
  Administered 2016-06-16: 50 mL/h via INTRAVENOUS

## 2016-06-16 SURGICAL SUPPLY — 8 items
CABLE SURGICAL S-101-97-12 (CABLE) ×2 IMPLANT
ICD ELLIPSE DR CD2411-36Q (ICD Generator) ×2 IMPLANT
LEAD DURATA 7122Q-65CM (Lead) ×2 IMPLANT
LEAD TENDRIL MRI 52CM LPA1200M (Lead) ×2 IMPLANT
PAD DEFIB LIFELINK (PAD) ×2 IMPLANT
SHEATH CLASSIC 7F (SHEATH) ×2 IMPLANT
SHEATH CLASSIC 8F (SHEATH) ×2 IMPLANT
TRAY PACEMAKER INSERTION (PACKS) ×2 IMPLANT

## 2016-06-16 NOTE — H&P (Signed)
Ladesha Goldberg presents to the hospital with a history of an ischemic cardiomyopathy.  She has an EF of 30-35% and is on maximal medical therapy.  Regular rhythm, no murmurs, lungs clear.  Plan for ICD placement for primary prevention.  Risks and benefits of the procedure explained.  Risks include, but are not limited to, bleeding, infection, tamponade, and pneumothorax.  The patient understands these risks and has agreed to the procedure.    Hailey Wright Meredith Leeds, MD 06/16/2016 9:59 AM   ICD Criteria  Current LVEF:25-30%. Within 12 months prior to implant: Yes   Heart failure history: Yes, Class I  Cardiomyopathy history: Yes, Ischemic Cardiomyopathy.  Atrial Fibrillation/Atrial Flutter: No.  Ventricular tachycardia history: No.  Cardiac arrest history: No.  History of syndromes with risk of sudden death: No.  Previous ICD: No.  Current ICD indication: Primary  PPM indication: Yes. Pacing type: Atrial. Less than 40% RV pacing requirement anticipated. Indication: Sick Sinus Syndrome   Class I or II Bradycardia indication present: No  Beta Blocker therapy for 3 or more months: Yes, prescribed.   Ace Inhibitor/ARB therapy for 3 or more months: Yes, prescribed.

## 2016-06-17 ENCOUNTER — Ambulatory Visit (HOSPITAL_COMMUNITY): Payer: Medicare Other

## 2016-06-17 ENCOUNTER — Telehealth: Payer: Self-pay | Admitting: Cardiology

## 2016-06-17 ENCOUNTER — Encounter (HOSPITAL_COMMUNITY): Payer: Medicare Other

## 2016-06-17 DIAGNOSIS — I252 Old myocardial infarction: Secondary | ICD-10-CM | POA: Diagnosis not present

## 2016-06-17 DIAGNOSIS — Z7982 Long term (current) use of aspirin: Secondary | ICD-10-CM | POA: Diagnosis not present

## 2016-06-17 DIAGNOSIS — I2510 Atherosclerotic heart disease of native coronary artery without angina pectoris: Secondary | ICD-10-CM | POA: Diagnosis not present

## 2016-06-17 DIAGNOSIS — E785 Hyperlipidemia, unspecified: Secondary | ICD-10-CM | POA: Diagnosis not present

## 2016-06-17 DIAGNOSIS — Z7984 Long term (current) use of oral hypoglycemic drugs: Secondary | ICD-10-CM | POA: Diagnosis not present

## 2016-06-17 DIAGNOSIS — Z006 Encounter for examination for normal comparison and control in clinical research program: Secondary | ICD-10-CM | POA: Diagnosis not present

## 2016-06-17 DIAGNOSIS — Z951 Presence of aortocoronary bypass graft: Secondary | ICD-10-CM | POA: Diagnosis not present

## 2016-06-17 DIAGNOSIS — Z950 Presence of cardiac pacemaker: Secondary | ICD-10-CM | POA: Diagnosis not present

## 2016-06-17 DIAGNOSIS — Z79899 Other long term (current) drug therapy: Secondary | ICD-10-CM | POA: Diagnosis not present

## 2016-06-17 DIAGNOSIS — Z7902 Long term (current) use of antithrombotics/antiplatelets: Secondary | ICD-10-CM | POA: Diagnosis not present

## 2016-06-17 DIAGNOSIS — I509 Heart failure, unspecified: Secondary | ICD-10-CM | POA: Diagnosis not present

## 2016-06-17 DIAGNOSIS — I255 Ischemic cardiomyopathy: Secondary | ICD-10-CM | POA: Diagnosis not present

## 2016-06-17 LAB — GLUCOSE, CAPILLARY: Glucose-Capillary: 98 mg/dL (ref 65–99)

## 2016-06-17 MED ORDER — METOPROLOL SUCCINATE ER 25 MG PO TB24: 12.5000 mg | ORAL_TABLET | Freq: Every day | ORAL | 1 refills | Status: DC

## 2016-06-17 MED ORDER — ATORVASTATIN CALCIUM 80 MG PO TABS: 80.0000 mg | ORAL_TABLET | Freq: Every day | ORAL | Status: DC

## 2016-06-17 MED FILL — Midazolam HCl Inj 5 MG/5ML (Base Equivalent): INTRAMUSCULAR | Qty: 5 | Status: AC

## 2016-06-17 NOTE — Progress Notes (Signed)
BP = 87/42. HR = 55. Amber, NP notified. Holding Coreg.

## 2016-06-17 NOTE — Discharge Summary (Signed)
ELECTROPHYSIOLOGY PROCEDURE DISCHARGE SUMMARY    Patient ID: Hailey Wright,  MRN: [id number], DOB/AGE: [birth year] 70 y.o.  Admit date: 06/16/2016 Discharge date: 06/17/2016  Primary Care Physician: Donnajean Lopes, MD Primary Cardiologist: Hochrein Electrophysiologist: [facility]  Primary Discharge Diagnosis:  Ischemic cardiomyopathy status post ICD implant this admission  Secondary Discharge Diagnosis:  1.  CAD 2.  Hyperlipidemia 3.  RAS  No Known Allergies   Procedures This Admission:  1.  Implantation of a STJ dual chamber ICD on 06/16/16 by Dr Curt Bears.  The patient received a STJ model number Fortify Ellipse ICD with model number [id number] right atrial lead, [id number] right ventricular lead.  DFT's were deferred at time of implant.  There were no immediate post procedure complications. 2.  CXR on 06/17/16 demonstrated no pneumothorax status post device implantation.   Brief HPI: Hailey Wright is a 70 y.o. female was referred to electrophysiology in the outpatient setting for consideration of ICD implantation.  Past medical history includes ischemic cardiomyopathy.  The patient has persistent LV dysfunction despite guideline directed therapy.  Risks, benefits, and alternatives to ICD implantation were reviewed with the patient who wished to proceed.   Hospital Course:  The patient was admitted and underwent implantation of a STJ dual chamber ICD with details as outlined above. She was monitored on telemetry overnight which demonstrated sinus rhythm.  Left chest was without hematoma or ecchymosis.  The device was interrogated and found to be functioning normally.  CXR was obtained and demonstrated no pneumothorax status post device implantation.  Wound care, arm mobility, and restrictions were reviewed with the patient.  The patient was examined and considered stable for discharge to home.   The patient's discharge medications include an ACE-I (Lisinopril) and beta blocker (Metoprolol).     Physical Exam: Vitals:   06/16/16 2209 06/17/16 0440 06/17/16 0700 06/17/16 0853  BP: 111/60 108/62  (!) 87/42  Pulse:  (!) 57 (!) 55   Resp:  13 17 19   Temp:  97.6 F (36.4 C)    TempSrc:  Oral    SpO2:  98% 96%   Weight:  147 lb 6.4 oz (66.9 kg)    Height:        GEN- The patient is well appearing, alert and oriented x 3 today.   HEENT: normocephalic, atraumatic; sclera clear, conjunctiva pink; hearing intact; oropharynx clear; neck supple  Lungs- Clear to ausculation bilaterally, normal work of breathing.  No wheezes, rales, rhonchi Heart- Regular rate and rhythm  GI- soft, non-tender, non-distended, bowel sounds present  Extremities- no clubbing, cyanosis, or edema; DP/PT/radial pulses 2+ bilaterally MS- no significant deformity or atrophy Skin- warm and dry, no rash or lesion, left chest without hematoma/ecchymosis Psych- euthymic mood, full affect Neuro- strength and sensation are intact   Labs:   Lab Results  Component Value Date   WBC 6.6 06/15/2016   HGB 13.9 06/15/2016   HCT 42.9 06/15/2016   MCV 84.1 06/15/2016   PLT 202 06/15/2016     Recent Labs Lab 06/15/16 1046  NA 139  K 5.1  CL 105  CO2 26  BUN 16  CREATININE 0.94  CALCIUM 9.7  GLUCOSE 92    Discharge Medications:    Medication List    STOP taking these medications   carvedilol 3.125 MG tablet Commonly known as:  COREG     TAKE these medications   aspirin 81 MG tablet Take 81 mg by mouth daily.   atorvastatin 80 MG tablet Commonly known  as:  LIPITOR Take 80 mg by mouth daily.   calcium-vitamin D 500-200 MG-UNIT tablet Commonly known as:  OSCAL WITH D Take 1 tablet by mouth 2 (two) times daily.   clopidogrel 75 MG tablet Commonly known as:  PLAVIX Take 1 tablet (75 mg total) by mouth daily.   Co-Enzyme Q10 200 MG Caps Take 1 capsule by mouth daily.   lisinopril 2.5 MG tablet Commonly known as:  PRINIVIL,ZESTRIL Take 1 tablet (2.5 mg total) by mouth daily.    Magnesium 500 MG Tabs Take 1 tablet by mouth daily.   metFORMIN 500 MG 24 hr tablet Commonly known as:  GLUCOPHAGE-XR Take 1 tablet by mouth every other day.   metoprolol succinate 25 MG 24 hr tablet Commonly known as:  TOPROL XL Take 0.5 tablets (12.5 mg total) by mouth daily. Dr Curt Bears feels this Keyshawn Hellwig affect your blood pressure less than the Coreg.  Hold medication if your SBP is less than 100 (top number)   multivitamin tablet Take 1 tablet by mouth daily.       Disposition:  Discharge Instructions    Diet - low sodium heart healthy    Complete by:  As directed   Increase activity slowly    Complete by:  As directed     Follow-up Information    [facility] Follow up on 06/29/2016.   Specialty:  Cardiology Why:  at [facility] for wound check  Contact information: [address], [place] [phone]          Duration of Discharge Encounter: Greater than 30 minutes including physician time.  Signed, Chanetta Marshall, NP 06/17/2016 10:24 AM   I have seen and examined this patient with Chanetta Marshall.  Agree with above, note added to reflect my findings.  On exam, regular rhythm, no murmurs, lungs clear.  Had dual chamber ICD placed for ischemic cardiomyopathy.  CXR and interrogation without issue.  Plan for discharge today with follow up in device clinic in 10 days.    Lyndal Alamillo M. Anitta Tenny MD 06/17/2016 10:55 AM

## 2016-06-17 NOTE — Telephone Encounter (Signed)
New Message:    Needs a signature for prescription for Metoprolol written today please.

## 2016-06-17 NOTE — Discharge Instructions (Signed)
° ° °  Supplemental Discharge Instructions for  Pacemaker/Defibrillator Patients  Activity No heavy lifting or vigorous activity with your left/right arm for 6 to 8 weeks.  Do not raise your left/right arm above your head for one week.  Gradually raise your affected arm as drawn below.           __          06/19/16                     06/20/16                      06/21/16                06/22/16  NO DRIVING for   1 week  ; you may begin driving on  [id number]   .  WOUND CARE - Keep the wound area clean and dry.  Do not get this area wet for one week. No showers for one week; you may shower on  06/22/16   . - The tape/steri-strips on your wound will fall off; do not pull them off.  No bandage is needed on the site.  DO  NOT apply any creams, oils, or ointments to the wound area. - If you notice any drainage or discharge from the wound, any swelling or bruising at the site, or you develop a fever > 101? F after you are discharged home, call the office at once.  Special Instructions - You are still able to use cellular telephones; use the ear opposite the side where you have your pacemaker/defibrillator.  Avoid carrying your cellular phone near your device. - When traveling through airports, show security personnel your identification card to avoid being screened in the metal detectors.  Ask the security personnel to use the hand wand. - Avoid arc welding equipment, MRI testing (magnetic resonance imaging), TENS units (transcutaneous nerve stimulators).  Call the office for questions about other devices. - Avoid electrical appliances that are in poor condition or are not properly grounded. - Microwave ovens are safe to be near or to operate.  Additional information for defibrillator patients should your device go off: - If your device goes off ONCE and you feel fine afterward, notify the device clinic nurses. - If your device goes off ONCE and you do not feel well afterward, call 911. - If your device  goes off TWICE, call 911. - If your device goes off THREE times in one day, call 911.  DO NOT DRIVE YOURSELF OR A FAMILY MEMBER WITH A DEFIBRILLATOR TO THE HOSPITAL--CALL 911.

## 2016-06-20 ENCOUNTER — Encounter (HOSPITAL_COMMUNITY): Payer: Medicare Other

## 2016-06-21 NOTE — Telephone Encounter (Signed)
Spoke with [facility] who tells me this issue has been resolved and medication has been filled for patient.

## 2016-06-22 ENCOUNTER — Encounter (HOSPITAL_COMMUNITY)
Admission: RE | Admit: 2016-06-22 | Discharge: 2016-06-22 | Disposition: A | Discharge status: Home / Self Care | Payer: Medicare Other | Source: Ambulatory Visit | Attending: Cardiology | Admitting: Cardiology

## 2016-06-22 DIAGNOSIS — I213 ST elevation (STEMI) myocardial infarction of unspecified site: Secondary | ICD-10-CM | POA: Insufficient documentation

## 2016-06-22 DIAGNOSIS — I701 Atherosclerosis of renal artery: Secondary | ICD-10-CM | POA: Insufficient documentation

## 2016-06-22 DIAGNOSIS — Z7982 Long term (current) use of aspirin: Secondary | ICD-10-CM | POA: Insufficient documentation

## 2016-06-22 DIAGNOSIS — Z79899 Other long term (current) drug therapy: Secondary | ICD-10-CM | POA: Insufficient documentation

## 2016-06-22 DIAGNOSIS — I2510 Atherosclerotic heart disease of native coronary artery without angina pectoris: Secondary | ICD-10-CM | POA: Insufficient documentation

## 2016-06-22 DIAGNOSIS — E785 Hyperlipidemia, unspecified: Secondary | ICD-10-CM | POA: Insufficient documentation

## 2016-06-22 DIAGNOSIS — Z87891 Personal history of nicotine dependence: Secondary | ICD-10-CM | POA: Insufficient documentation

## 2016-06-22 DIAGNOSIS — Z951 Presence of aortocoronary bypass graft: Secondary | ICD-10-CM | POA: Insufficient documentation

## 2016-06-22 DIAGNOSIS — Z7984 Long term (current) use of oral hypoglycemic drugs: Secondary | ICD-10-CM | POA: Insufficient documentation

## 2016-06-24 ENCOUNTER — Encounter: Payer: Self-pay | Admitting: Cardiology

## 2016-06-24 ENCOUNTER — Encounter (HOSPITAL_COMMUNITY): Payer: Medicare Other

## 2016-06-27 ENCOUNTER — Encounter (HOSPITAL_COMMUNITY): Admission: RE | Admit: 2016-06-27 | Payer: Medicare Other | Source: Ambulatory Visit

## 2016-06-29 ENCOUNTER — Telehealth (HOSPITAL_COMMUNITY): Payer: Self-pay | Admitting: Cardiac Rehabilitation

## 2016-06-29 ENCOUNTER — Ambulatory Visit (INDEPENDENT_AMBULATORY_CARE_PROVIDER_SITE_OTHER): Payer: Medicare Other | Admitting: *Deleted

## 2016-06-29 ENCOUNTER — Encounter: Payer: Self-pay | Admitting: Cardiology

## 2016-06-29 ENCOUNTER — Encounter (HOSPITAL_COMMUNITY): Payer: Medicare Other

## 2016-06-29 DIAGNOSIS — I5022 Chronic systolic (congestive) heart failure: Secondary | ICD-10-CM | POA: Diagnosis not present

## 2016-06-29 DIAGNOSIS — I255 Ischemic cardiomyopathy: Secondary | ICD-10-CM | POA: Diagnosis not present

## 2016-06-29 NOTE — Progress Notes (Signed)

## 2016-06-29 NOTE — Telephone Encounter (Signed)
-----   Message from Will Meredith Leeds, MD sent at 06/29/2016 11:14 AM EDT ----- Regarding: RE: cardiac rehab  Needs to be seen at 10 day appointment post device.  If that looks OK should be able to return just fine.  There will be lifting restrictions on her left arm that she will know about which will be discussed at the device appointment. ----- Message ----- From: Lowell Guitar, RN Sent: 06/29/2016  10:58 AM To: Constance Haw, MD Subject: cardiac rehab                                  Dear Dr. Curt Bears,  [place] had ICD implant 06/17/16.  When is it appropriate for her to return to cardiac rehab?    Thank you,  Andi Hence, RN, BSN Cardiac Pulmonary Rehab

## 2016-06-30 LAB — CUP PACEART INCLINIC DEVICE CHECK
Battery Remaining Longevity: 96 mo
Brady Statistic RV Percent Paced: 0 %
Date Time Interrogation Session: 20170809103804
HighPow Impedance: 59.625
Implantable Lead Implant Date: 20170727
Implantable Lead Location: 753860
Lead Channel Impedance Value: 450 Ohm
Lead Channel Pacing Threshold Amplitude: 0.5 V
Lead Channel Pacing Threshold Pulse Width: 0.5 ms
Lead Channel Pacing Threshold Pulse Width: 0.5 ms
Lead Channel Pacing Threshold Pulse Width: 0.5 ms
Lead Channel Sensing Intrinsic Amplitude: 12 mV
Lead Channel Setting Pacing Amplitude: 3.5 V
Lead Channel Setting Pacing Amplitude: 3.5 V
Lead Channel Setting Sensing Sensitivity: 0.5 mV
MDC IDC LEAD IMPLANT DT: 20170727
MDC IDC LEAD LOCATION: 753859
MDC IDC MSMT LEADCHNL RA IMPEDANCE VALUE: 525 Ohm
MDC IDC MSMT LEADCHNL RA PACING THRESHOLD AMPLITUDE: 0.5 V
MDC IDC MSMT LEADCHNL RA SENSING INTR AMPL: 5 mV
MDC IDC MSMT LEADCHNL RV PACING THRESHOLD AMPLITUDE: 0.75 V
MDC IDC MSMT LEADCHNL RV PACING THRESHOLD AMPLITUDE: 0.75 V
MDC IDC MSMT LEADCHNL RV PACING THRESHOLD PULSEWIDTH: 0.5 ms
MDC IDC SET LEADCHNL RV PACING PULSEWIDTH: 0.5 ms
MDC IDC STAT BRADY RA PERCENT PACED: 3.1 %
Pulse Gen Serial Number: 7261095

## 2016-07-01 ENCOUNTER — Encounter (HOSPITAL_COMMUNITY): Admission: RE | Admit: 2016-07-01 | Payer: Medicare Other | Source: Ambulatory Visit

## 2016-07-04 ENCOUNTER — Encounter (HOSPITAL_COMMUNITY): Payer: Medicare Other

## 2016-07-06 ENCOUNTER — Encounter (HOSPITAL_COMMUNITY): Admission: RE | Admit: 2016-07-06 | Payer: Medicare Other | Source: Ambulatory Visit

## 2016-07-06 NOTE — Progress Notes (Signed)
Cardiac Individual Treatment Plan  Patient Details  Name: Hailey Wright MRN: [id number] Date of Birth: 1946/03/21 Referring Provider:   Flowsheet Row CARDIAC REHAB PHASE II ORIENTATION from 04/26/2016 in [facility]  Referring Provider  Minus Breeding MD      Initial Encounter Date:  [place] PHASE II ORIENTATION from 04/26/2016 in [facility]  Date  04/26/16  Referring Provider  Minus Breeding MD      Visit Diagnosis: ST elevation (STEMI) myocardial infarction of unspecified site ([place])  S/P CABG x 2  Patient's Home Medications on Admission:  Current Outpatient Prescriptions:  .  aspirin 81 MG tablet, Take 81 mg by mouth daily., Disp: , Rfl:  .  atorvastatin (LIPITOR) 80 MG tablet, Take 80 mg by mouth daily., Disp: , Rfl:  .  calcium-vitamin D (OSCAL WITH D) 500-200 MG-UNIT per tablet, Take 1 tablet by mouth 2 (two) times daily., Disp: , Rfl:  .  clopidogrel (PLAVIX) 75 MG tablet, Take 1 tablet (75 mg total) by mouth daily., Disp: 30 tablet, Rfl: 6 .  Co-Enzyme Q10 200 MG CAPS, Take 1 capsule by mouth daily. , Disp: , Rfl:  .  lisinopril (PRINIVIL,ZESTRIL) 2.5 MG tablet, Take 1 tablet (2.5 mg total) by mouth daily., Disp: 30 tablet, Rfl: 3 .  Magnesium 500 MG TABS, Take 1 tablet by mouth daily. , Disp: , Rfl:  .  metFORMIN (GLUCOPHAGE-XR) 500 MG 24 hr tablet, Take 1 tablet by mouth every other day. , Disp: , Rfl:  .  metoprolol succinate (TOPROL XL) 25 MG 24 hr tablet, Take 0.5 tablets (12.5 mg total) by mouth daily. Dr Curt Bears feels this will affect your blood pressure less than the Coreg.  Hold medication if your SBP is less than 100 (top number), Disp: 30 tablet, Rfl: 1 .  Multiple Vitamin (MULTIVITAMIN) tablet, Take 1 tablet by mouth daily., Disp: , Rfl:   Past Medical History: Past Medical History:  Diagnosis Date  . Acute ST elevation myocardial infarction (STEMI) involving left anterior  descending coronary artery ([place]) 02/18/2016  . AICD (automatic cardioverter/defibrillator) present   . CAD (coronary artery disease) 06/04/2013  . Coronary artery thrombosis ([place]) 02/18/2016  . DVT (deep venous thrombosis) ([place]) 1980s   RLE  . Hepatitis A infection 1987  . Hyperlipidemia   . Ischemic cardiomyopathy    a. Echo 5/17: EF 25-30%, anteroseptal, anterior, anteroseptal and apical akinesis, possible inferior hypokinesis, grade 1 diastolic dysfunction, no evidence of thrombus, trivial AI, mild MR, mild LAE  . MI (myocardial infarction) ([place]) 1991   Inferior treated with TPA 1991, POBA RCA 1996  . Pneumonia 1980s X 1  . RAS (renal artery stenosis) ([facility])    Right renal stent 2005  . S/P Emergency CABG x 2 02/18/2016   LIMA to LAD, SVG to OM1, EVH via right thigh  . Type II diabetes mellitus ([place])   . Unstable angina ([place]) 02/17/2016    Tobacco Use: History  Smoking Status  . Former Smoker  . Packs/day: 1.00  . Years: 40.00  . Types: Cigarettes  . Quit date: 06/05/2011  Smokeless Tobacco  . Never Used    Labs: Recent Review Flowsheet Data    Labs for ITP Cardiac and Pulmonary Rehab Latest Ref Rng & Units 02/19/2016 02/19/2016 02/19/2016 02/19/2016 02/20/2016   PHART 7.350 - 7.450 7.405 7.331(L) 7.330(L) - -   PCO2ART 35.0 - 45.0 mmHg 38.5 44.0 42.9 - -   HCO3  20.0 - 24.0 mEq/L 24.0 23.1 22.6 - -   TCO2 0 - 100 mmol/L 25 24 24 23 24    ACIDBASEDEF 0.0 - 2.0 mmol/L 1.0 3.0(H) 3.0(H) - -   O2SAT % 97.0 96.0 93.0 - -      Capillary Blood Glucose: Lab Results  Component Value Date   GLUCAP 98 06/17/2016   GLUCAP 116 (H) 06/16/2016   GLUCAP 114 (H) 06/16/2016   GLUCAP 118 (H) 06/16/2016   GLUCAP 89 05/09/2016     Exercise Target Goals:    Exercise Program Goal: Individual exercise prescription set with THRR, safety & activity barriers. Participant demonstrates ability to understand and report RPE using BORG scale, to self-measure pulse accurately, and to acknowledge the  importance of the exercise prescription.  Exercise Prescription Goal: Starting with aerobic activity 30 plus minutes a day, 3 days per week for initial exercise prescription. Provide home exercise prescription and guidelines that participant acknowledges understanding prior to discharge.  Activity Barriers & Risk Stratification:     Activity Barriers & Cardiac Risk Stratification - 04/26/16 1506      Activity Barriers & Cardiac Risk Stratification   Activity Barriers None   Cardiac Risk Stratification High      6 Minute Walk:     6 Minute Walk    Row Name 04/26/16 1654         6 Minute Walk   Phase Initial     Distance 1700 feet     Walk Time 6 minutes     # of Rest Breaks 0     MPH 3.22     METS 3.65     RPE 13     VO2 Peak 12.79     Symptoms No     Resting HR 71 bpm     Resting BP 102/68     Max Ex. HR 85 bpm     Max Ex. BP 118/80     2 Minute Post BP 110/60        Initial Exercise Prescription:     Initial Exercise Prescription - 04/26/16 1600      Date of Initial Exercise RX and Referring Provider   Date 04/26/16   Referring Provider Minus Breeding MD     Treadmill   MPH 2.8   Grade 1   Minutes 10   METs 3.53     Bike   Level 1   Minutes 10   METs 3.7     NuStep   Level 3   Minutes 10   METs 2.8     Prescription Details   Frequency (times per week) 3   Duration Progress to 30 minutes of continuous aerobic without signs/symptoms of physical distress     Intensity   THRR 40-80% of Max Heartrate 61-122 bpm   Ratings of Perceived Exertion 11-13   Perceived Dyspnea 0-4     Progression   Progression Continue to progress workloads to maintain intensity without signs/symptoms of physical distress.     Resistance Training   Training Prescription Yes   Weight 2 lb   Reps 10-12      Perform Capillary Blood Glucose checks as needed.  Exercise Prescription Changes:      Exercise Prescription Changes    Row Name 05/23/16 0900  06/02/16 1600 06/15/16 1600         Exercise Review   Progression Yes Yes Yes       Response to Exercise   Blood Pressure (Admit) 104/68  98/66 106/70     Blood Pressure (Exercise) 104/70 124/74 122/70     Blood Pressure (Exit) 98/62 98/68 100/62     Heart Rate (Admit) 67 bpm 67 bpm 81 bpm     Heart Rate (Exercise) 100 bpm 99 bpm 101 bpm     Heart Rate (Exit) 63 bpm 65 bpm 70 bpm     Rating of Perceived Exertion (Exercise) 12 13 12      Duration Progress to 30 minutes of continuous aerobic without signs/symptoms of physical distress Progress to 30 minutes of continuous aerobic without signs/symptoms of physical distress Progress to 30 minutes of continuous aerobic without signs/symptoms of physical distress     Intensity THRR unchanged THRR unchanged THRR unchanged       Progression   Progression Continue to progress workloads to maintain intensity without signs/symptoms of physical distress. Continue to progress workloads to maintain intensity without signs/symptoms of physical distress. Continue to progress workloads to maintain intensity without signs/symptoms of physical distress.     Average METs 2.8 3.5 3.8       Resistance Training   Training Prescription Yes Yes Yes     Weight 3lbs 3lbs 3lbs     Reps 10-12 10-12 10-12       Treadmill   MPH 3 3 3      Grade 2 2 2      Minutes 10 10 10      METs 4.12 4.12 4.12       Bike   Level 1 1 1      Minutes 10 10 10      METs 3.7 3.7 3.7       NuStep   Level 4 4 4      Minutes 10 10 10      METs 3 3.4 3.7       Home Exercise Plan   Plans to continue exercise at [facility] (comment)  Reviewed on 05/11/16. Plans to go to [facility] (comment)  Reviewed on 05/11/16. Plans to go to [facility] (comment)  Reviewed on 05/11/16. Plans to go to [facility]     Frequency Add 2 additional days to program exercise sessions. Add 2 additional days to program exercise sessions. Add 2 additional days to  program exercise sessions.        Exercise Comments:      Exercise Comments    Row Name 06/02/16 1628           Exercise Comments Reviewed METs and goals. Pt is tolerating exercise well; will continue to monitor exercise progression          Discharge Exercise Prescription (Final Exercise Prescription Changes):     Exercise Prescription Changes - 06/15/16 1600      Exercise Review   Progression Yes     Response to Exercise   Blood Pressure (Admit) 106/70   Blood Pressure (Exercise) 122/70   Blood Pressure (Exit) 100/62   Heart Rate (Admit) 81 bpm   Heart Rate (Exercise) 101 bpm   Heart Rate (Exit) 70 bpm   Rating of Perceived Exertion (Exercise) 12   Duration Progress to 30 minutes of continuous aerobic without signs/symptoms of physical distress   Intensity THRR unchanged     Progression   Progression Continue to progress workloads to maintain intensity without signs/symptoms of physical distress.   Average METs 3.8     Resistance Training   Training Prescription Yes   Weight 3lbs   Reps 10-12  Treadmill   MPH 3   Grade 2   Minutes 10   METs 4.12     Bike   Level 1   Minutes 10   METs 3.7     NuStep   Level 4   Minutes 10   METs 3.7     Home Exercise Plan   Plans to continue exercise at [facility] (comment)  Reviewed on 05/11/16. Plans to go to [facility]   Frequency Add 2 additional days to program exercise sessions.      Nutrition:  Target Goals: Understanding of nutrition guidelines, daily intake of sodium 1500mg , cholesterol 200mg , calories 30% from fat and 7% or less from saturated fats, daily to have 5 or more servings of fruits and vegetables.  Biometrics:     Pre Biometrics - 04/26/16 1656      Pre Biometrics   Height 5\' 7"  (1.702 m)   Weight 156 lb 4.9 oz (70.9 kg)   Waist Circumference 32 inches   Hip Circumference 39.5 inches   Waist to Hip Ratio 0.81 %   BMI (Calculated) 24.5   Triceps Skinfold 33.5 mm   %  Body Fat 36.8 %   Grip Strength 34.5 kg   Flexibility 12.25 in   Single Leg Stand 30 seconds       Nutrition Therapy Plan and Nutrition Goals:     Nutrition Therapy & Goals - 04/28/16 1018      Nutrition Therapy   Diet Carb Modified, Therapeutic Lifestyle Changes      Personal Nutrition Goals   Personal Goal #1 Maintain wt around 156 lb while in [place], educate and counsel regarding individualized specific dietary modifications aiming towards targeted core components such as weight, hypertension, lipid management, diabetes, heart failure and other comorbidities.   Expected Outcomes Short Term Goal: Understand basic principles of dietary content, such as calories, fat, sodium, cholesterol and nutrients.;Long Term Goal: Adherence to prescribed nutrition plan.      Nutrition Discharge: Nutrition Scores:     Nutrition Assessments - 04/28/16 1019      MEDFICTS Scores   Pre Score 0      Nutrition Goals Re-Evaluation:   Psychosocial: Target Goals: Acknowledge presence or absence of depression, maximize coping skills, provide positive support system. Participant is able to verbalize types and ability to use techniques and skills needed for reducing stress and depression.  Initial Review & Psychosocial Screening:     Initial Psych Review & Screening - 05/02/16 1002      Family Dynamics   Good Support System? Yes     Barriers   Psychosocial barriers to participate in program There are no identifiable barriers or psychosocial needs.     Screening Interventions   Interventions Encouraged to exercise      Quality of Life Scores:     Quality of Life - 04/26/16 1700      Quality of Life Scores   Health/Function Pre 26.89 %   Socioeconomic Pre 30 %   Psych/Spiritual Pre 28.29 %   Family Pre 30 %   GLOBAL Pre 28.27 %      PHQ-9: Recent Review Flowsheet Data    Depression screen [facility] 2/9 05/02/2016   Decreased  Interest 0   Down, Depressed, Hopeless 0   PHQ - 2 Score 0      Psychosocial Evaluation and Intervention:     Psychosocial Evaluation - 06/01/16 1730  Psychosocial Evaluation & Interventions   Interventions Stress management education;Relaxation education;Encouraged to exercise with the program and follow exercise prescription   Comments pt is exercising on her own at home. pt states she feels more comfortable and confident with exercising on her own.    Continued Psychosocial Services Needed Yes      Psychosocial Re-Evaluation:   Vocational Rehabilitation: Provide vocational rehab assistance to qualifying candidates.   Vocational Rehab Evaluation & Intervention:     Vocational Rehab - 04/26/16 1541      Initial Vocational Rehab Evaluation & Intervention   Assessment shows need for Vocational Rehabilitation No      Education: Education Goals: Education classes will be provided on a weekly basis, covering required topics. Participant will state understanding/return demonstration of topics presented.  Learning Barriers/Preferences:     Learning Barriers/Preferences - 04/26/16 1540      Learning Barriers/Preferences   Learning Barriers None   Learning Preferences Group Instruction;Skilled Demonstration      Education Topics: Count Your Pulse:  -Group instruction provided by verbal instruction, demonstration, patient participation and written materials to support subject.  Instructors address importance of being able to find your pulse and how to count your pulse when at home without a heart monitor.  Patients get hands on experience counting their pulse with staff help and individually. Flowsheet Row CARDIAC REHAB PHASE II EXERCISE from 06/15/2016 in [place]  Date  05/27/16  Educator  Maurice Small RN  Instruction Review Code  2- meets goals/outcomes      Heart Attack, Angina, and Risk Factor Modification:  -Group  instruction provided by verbal instruction, video, and written materials to support subject.  Instructors address signs and symptoms of angina and heart attacks.    Also discuss risk factors for heart disease and how to make changes to improve heart health risk factors.   Functional Fitness:  -Group instruction provided by verbal instruction, demonstration, patient participation, and written materials to support subject.  Instructors address safety measures for doing things around the house.  Discuss how to get up and down off the floor, how to pick things up properly, how to safely get out of a chair without assistance, and balance training. Flowsheet Row CARDIAC REHAB PHASE II EXERCISE from 06/15/2016 in [place]  Date  06/10/16  Instruction Review Code  2- meets goals/outcomes      Meditation and Mindfulness:  -Group instruction provided by verbal instruction, patient participation, and written materials to support subject.  Instructor addresses importance of mindfulness and meditation practice to help reduce stress and improve awareness.  Instructor also leads participants through a meditation exercise.  Flowsheet Row CARDIAC REHAB PHASE II EXERCISE from 06/15/2016 in [place]  Date  05/25/16  Instruction Review Code  2- meets goals/outcomes      Stretching for Flexibility and Mobility:  -Group instruction provided by verbal instruction, patient participation, and written materials to support subject.  Instructors lead participants through series of stretches that are designed to increase flexibility thus improving mobility.  These stretches are additional exercise for major muscle groups that are typically performed during regular warm up and cool down. Flowsheet Row CARDIAC REHAB PHASE II EXERCISE from 06/15/2016 in [place]  Date  05/11/16  Instruction Review Code  2- meets goals/outcomes       Hands Only CPR Anytime:  -Group instruction provided by verbal instruction, video, patient participation and  written materials to support subject.  Instructors co-teach with AHA video for hands only CPR.  Participants get hands on experience with mannequins. Flowsheet Row CARDIAC REHAB PHASE II EXERCISE from 06/15/2016 in [place]  Date  05/06/16  Instruction Review Code  2- meets goals/outcomes      Nutrition I class: Heart Healthy Eating:  -Group instruction provided by PowerPoint slides, verbal discussion, and written materials to support subject matter. The instructor gives an explanation and review of the Therapeutic Lifestyle Changes diet recommendations, which includes a discussion on lipid goals, dietary fat, sodium, fiber, plant stanol/sterol esters, sugar, and the components of a well-balanced, healthy diet.   Nutrition II class: Lifestyle Skills:  -Group instruction provided by PowerPoint slides, verbal discussion, and written materials to support subject matter. The instructor gives an explanation and review of label reading, grocery shopping for heart health, heart healthy recipe modifications, and ways to make healthier choices when eating out. Flowsheet Row CARDIAC REHAB PHASE II EXERCISE from 06/15/2016 in [place]  Date  06/14/16  Educator  RD  Instruction Review Code  2- meets goals/outcomes      Diabetes Question & Answer:  -Group instruction provided by PowerPoint slides, verbal discussion, and written materials to support subject matter. The instructor gives an explanation and review of diabetes co-morbidities, pre- and post-prandial blood glucose goals, pre-exercise blood glucose goals, signs, symptoms, and treatment of hypoglycemia and hyperglycemia, and foot care basics.   Diabetes Blitz:  -Group instruction provided by PowerPoint slides, verbal discussion, and written materials to support  subject matter. The instructor gives an explanation and review of the physiology behind type 1 and type 2 diabetes, diabetes medications and rational behind using different medications, pre- and post-prandial blood glucose recommendations and Hemoglobin A1c goals, diabetes diet, and exercise including blood glucose guidelines for exercising safely.    Portion Distortion:  -Group instruction provided by PowerPoint slides, verbal discussion, written materials, and food models to support subject matter. The instructor gives an explanation of serving size versus portion size, changes in portions sizes over the last 20 years, and what consists of a serving from each food group. Flowsheet Row CARDIAC REHAB PHASE II EXERCISE from 06/15/2016 in [place]  Date  05/18/16  Educator  RD  Instruction Review Code  2- meets goals/outcomes      Stress Management:  -Group instruction provided by verbal instruction, video, and written materials to support subject matter.  Instructors review role of stress in heart disease and how to cope with stress positively.     Exercising on Your Own:  -Group instruction provided by verbal instruction, power point, and written materials to support subject.  Instructors discuss benefits of exercise, components of exercise, frequency and intensity of exercise, and end points for exercise.  Also discuss use of nitroglycerin and activating EMS.  Review options of places to exercise outside of rehab.  Review guidelines for sex with heart disease.   Cardiac Drugs I:  -Group instruction provided by verbal instruction and written materials to support subject.  Instructor reviews cardiac drug classes: antiplatelets, anticoagulants, beta blockers, and statins.  Instructor discusses reasons, side effects, and lifestyle considerations for each drug class. Flowsheet Row CARDIAC REHAB PHASE II EXERCISE from 06/15/2016 in [place]  Date  05/04/16  Educator  Pharm D  Instruction Review Code  2- meets goals/outcomes      Cardiac Drugs II:  -  Group instruction provided by verbal instruction and written materials to support subject.  Instructor reviews cardiac drug classes: angiotensin converting enzyme inhibitors (ACE-I), angiotensin II receptor blockers (ARBs), nitrates, and calcium channel blockers.  Instructor discusses reasons, side effects, and lifestyle considerations for each drug class. Flowsheet Row CARDIAC REHAB PHASE II EXERCISE from 06/15/2016 in [place]  Date  06/01/16  Instruction Review Code  2- meets goals/outcomes      Anatomy and Physiology of the Circulatory System:  -Group instruction provided by verbal instruction, video, and written materials to support subject.  Reviews functional anatomy of heart, how it relates to various diagnoses, and what role the heart plays in the overall system.   Knowledge Questionnaire Score:     Knowledge Questionnaire Score - 04/26/16 1655      Knowledge Questionnaire Score   Pre Score 23/24      Core Components/Risk Factors/Patient Goals at Admission:     Personal Goals and Risk Factors at Admission - 04/26/16 1541      Core Components/Risk Factors/Patient Goals on Admission   Diabetes Yes   Intervention Provide education about proper nutrition, including hydration, and aerobic/resistive exercise prescription along with prescribed medications to achieve blood glucose in normal ranges: Fasting glucose 65-99 mg/dL   Expected Outcomes Short Term: Participant verbalizes understanding of the signs/symptoms and immediate care of hyper/hypoglycemia, proper foot care and importance of medication, aerobic/resistive exercise and nutrition plan for blood glucose control.   Lipids Yes   Expected Outcomes Short Term: Participant states understanding of desired cholesterol values and is compliant with medications prescribed.  Participant is following exercise prescription and nutrition guidelines.;Long Term: Cholesterol controlled with medications as prescribed, with individualized exercise RX and with personalized nutrition plan. Value goals: LDL < 70mg , HDL > 40 mg.      Core Components/Risk Factors/Patient Goals Review:      Goals and Risk Factor Review    Row Name 06/02/16 1629             Core Components/Risk Factors/Patient Goals Review   Personal Goals Review Other       Review Feeling good overall; really enjoy the nutrition classes and is getting good nutrition information       Expected Outcomes Pt will take nutrition information and apply to lifestyle to live a more heart healthy life          Core Components/Risk Factors/Patient Goals at Discharge (Final Review):      Goals and Risk Factor Review - 06/02/16 1629      Core Components/Risk Factors/Patient Goals Review   Personal Goals Review Other   Review Feeling good overall; really enjoy the nutrition classes and is getting good nutrition information   Expected Outcomes Pt will take nutrition information and apply to lifestyle to live a more heart healthy life      ITP Comments:     ITP Comments    Row Name 04/26/16 1539           ITP Comments Dr. Fransico Him, Medical Director           Comments: Pt is making expected progress toward personal goals after completing 17 sessions.  Pt has been out on medical leave due to ICD placement.  Pt returned to exercise today, cleared by Dr. Curt Bears with left arm restrictions. Pt tolerated light activity without difficulty.   Recommend continued exercise and life style modification education including  stress management and relaxation techniques to decrease cardiac risk  profile.

## 2016-07-06 NOTE — Addendum Note (Signed)
Encounter addended by: Lowell Guitar, RN on: 07/06/2016  5:41 PM<BR>    Actions taken: Pend clinical note

## 2016-07-08 ENCOUNTER — Encounter (HOSPITAL_COMMUNITY)
Admission: RE | Admit: 2016-07-08 | Discharge: 2016-07-08 | Disposition: A | Discharge status: Home / Self Care | Payer: Medicare Other | Source: Ambulatory Visit | Attending: Cardiology | Admitting: Cardiology

## 2016-07-08 DIAGNOSIS — I213 ST elevation (STEMI) myocardial infarction of unspecified site: Secondary | ICD-10-CM

## 2016-07-08 DIAGNOSIS — Z951 Presence of aortocoronary bypass graft: Secondary | ICD-10-CM | POA: Diagnosis not present

## 2016-07-08 DIAGNOSIS — I2510 Atherosclerotic heart disease of native coronary artery without angina pectoris: Secondary | ICD-10-CM | POA: Diagnosis not present

## 2016-07-08 DIAGNOSIS — Z7984 Long term (current) use of oral hypoglycemic drugs: Secondary | ICD-10-CM | POA: Diagnosis not present

## 2016-07-08 DIAGNOSIS — Z87891 Personal history of nicotine dependence: Secondary | ICD-10-CM | POA: Diagnosis not present

## 2016-07-08 DIAGNOSIS — I701 Atherosclerosis of renal artery: Secondary | ICD-10-CM | POA: Diagnosis not present

## 2016-07-08 DIAGNOSIS — Z79899 Other long term (current) drug therapy: Secondary | ICD-10-CM | POA: Diagnosis not present

## 2016-07-08 DIAGNOSIS — E785 Hyperlipidemia, unspecified: Secondary | ICD-10-CM | POA: Diagnosis not present

## 2016-07-08 DIAGNOSIS — Z7982 Long term (current) use of aspirin: Secondary | ICD-10-CM | POA: Diagnosis not present

## 2016-07-11 ENCOUNTER — Encounter (HOSPITAL_COMMUNITY)
Admission: RE | Admit: 2016-07-11 | Discharge: 2016-07-11 | Disposition: A | Discharge status: Home / Self Care | Payer: Medicare Other | Source: Ambulatory Visit | Attending: Cardiology | Admitting: Cardiology

## 2016-07-11 DIAGNOSIS — E785 Hyperlipidemia, unspecified: Secondary | ICD-10-CM | POA: Diagnosis not present

## 2016-07-11 DIAGNOSIS — Z79899 Other long term (current) drug therapy: Secondary | ICD-10-CM | POA: Diagnosis not present

## 2016-07-11 DIAGNOSIS — Z7982 Long term (current) use of aspirin: Secondary | ICD-10-CM | POA: Diagnosis not present

## 2016-07-11 DIAGNOSIS — Z951 Presence of aortocoronary bypass graft: Secondary | ICD-10-CM | POA: Diagnosis not present

## 2016-07-11 DIAGNOSIS — Z7984 Long term (current) use of oral hypoglycemic drugs: Secondary | ICD-10-CM | POA: Diagnosis not present

## 2016-07-11 DIAGNOSIS — I213 ST elevation (STEMI) myocardial infarction of unspecified site: Secondary | ICD-10-CM

## 2016-07-13 ENCOUNTER — Encounter (HOSPITAL_COMMUNITY)
Admission: RE | Admit: 2016-07-13 | Discharge: 2016-07-13 | Disposition: A | Discharge status: Home / Self Care | Payer: Medicare Other | Source: Ambulatory Visit | Attending: Cardiology | Admitting: Cardiology

## 2016-07-13 DIAGNOSIS — Z79899 Other long term (current) drug therapy: Secondary | ICD-10-CM | POA: Diagnosis not present

## 2016-07-13 DIAGNOSIS — Z951 Presence of aortocoronary bypass graft: Secondary | ICD-10-CM | POA: Diagnosis not present

## 2016-07-13 DIAGNOSIS — E785 Hyperlipidemia, unspecified: Secondary | ICD-10-CM | POA: Diagnosis not present

## 2016-07-13 DIAGNOSIS — Z7982 Long term (current) use of aspirin: Secondary | ICD-10-CM | POA: Diagnosis not present

## 2016-07-13 DIAGNOSIS — I213 ST elevation (STEMI) myocardial infarction of unspecified site: Secondary | ICD-10-CM

## 2016-07-13 DIAGNOSIS — Z7984 Long term (current) use of oral hypoglycemic drugs: Secondary | ICD-10-CM | POA: Diagnosis not present

## 2016-07-15 ENCOUNTER — Encounter (HOSPITAL_COMMUNITY)
Admission: RE | Admit: 2016-07-15 | Discharge: 2016-07-15 | Disposition: A | Discharge status: Home / Self Care | Payer: Medicare Other | Source: Ambulatory Visit | Attending: Cardiology | Admitting: Cardiology

## 2016-07-15 DIAGNOSIS — E785 Hyperlipidemia, unspecified: Secondary | ICD-10-CM | POA: Diagnosis not present

## 2016-07-15 DIAGNOSIS — Z951 Presence of aortocoronary bypass graft: Secondary | ICD-10-CM

## 2016-07-15 DIAGNOSIS — I213 ST elevation (STEMI) myocardial infarction of unspecified site: Secondary | ICD-10-CM

## 2016-07-15 DIAGNOSIS — Z7982 Long term (current) use of aspirin: Secondary | ICD-10-CM | POA: Diagnosis not present

## 2016-07-15 DIAGNOSIS — Z7984 Long term (current) use of oral hypoglycemic drugs: Secondary | ICD-10-CM | POA: Diagnosis not present

## 2016-07-15 DIAGNOSIS — Z79899 Other long term (current) drug therapy: Secondary | ICD-10-CM | POA: Diagnosis not present

## 2016-07-18 ENCOUNTER — Encounter (HOSPITAL_COMMUNITY)
Admission: RE | Admit: 2016-07-18 | Discharge: 2016-07-18 | Disposition: A | Discharge status: Home / Self Care | Payer: Medicare Other | Source: Ambulatory Visit | Attending: Cardiology | Admitting: Cardiology

## 2016-07-18 DIAGNOSIS — Z79899 Other long term (current) drug therapy: Secondary | ICD-10-CM | POA: Diagnosis not present

## 2016-07-18 DIAGNOSIS — I213 ST elevation (STEMI) myocardial infarction of unspecified site: Secondary | ICD-10-CM

## 2016-07-18 DIAGNOSIS — Z951 Presence of aortocoronary bypass graft: Secondary | ICD-10-CM | POA: Diagnosis not present

## 2016-07-18 DIAGNOSIS — Z7984 Long term (current) use of oral hypoglycemic drugs: Secondary | ICD-10-CM | POA: Diagnosis not present

## 2016-07-18 DIAGNOSIS — E785 Hyperlipidemia, unspecified: Secondary | ICD-10-CM | POA: Diagnosis not present

## 2016-07-18 DIAGNOSIS — Z7982 Long term (current) use of aspirin: Secondary | ICD-10-CM | POA: Diagnosis not present

## 2016-07-20 ENCOUNTER — Encounter (HOSPITAL_COMMUNITY)
Admission: RE | Admit: 2016-07-20 | Discharge: 2016-07-20 | Disposition: A | Discharge status: Home / Self Care | Payer: Medicare Other | Source: Ambulatory Visit | Attending: Cardiology | Admitting: Cardiology

## 2016-07-20 DIAGNOSIS — Z7984 Long term (current) use of oral hypoglycemic drugs: Secondary | ICD-10-CM | POA: Diagnosis not present

## 2016-07-20 DIAGNOSIS — Z951 Presence of aortocoronary bypass graft: Secondary | ICD-10-CM | POA: Diagnosis not present

## 2016-07-20 DIAGNOSIS — I213 ST elevation (STEMI) myocardial infarction of unspecified site: Secondary | ICD-10-CM

## 2016-07-20 DIAGNOSIS — Z7982 Long term (current) use of aspirin: Secondary | ICD-10-CM | POA: Diagnosis not present

## 2016-07-20 DIAGNOSIS — Z79899 Other long term (current) drug therapy: Secondary | ICD-10-CM | POA: Diagnosis not present

## 2016-07-20 DIAGNOSIS — E785 Hyperlipidemia, unspecified: Secondary | ICD-10-CM | POA: Diagnosis not present

## 2016-07-22 ENCOUNTER — Encounter (HOSPITAL_COMMUNITY)
Admission: RE | Admit: 2016-07-22 | Discharge: 2016-07-22 | Disposition: A | Discharge status: Home / Self Care | Payer: Medicare Other | Source: Ambulatory Visit | Attending: Cardiology | Admitting: Cardiology

## 2016-07-22 DIAGNOSIS — Z7982 Long term (current) use of aspirin: Secondary | ICD-10-CM | POA: Diagnosis not present

## 2016-07-22 DIAGNOSIS — I2510 Atherosclerotic heart disease of native coronary artery without angina pectoris: Secondary | ICD-10-CM | POA: Insufficient documentation

## 2016-07-22 DIAGNOSIS — Z79899 Other long term (current) drug therapy: Secondary | ICD-10-CM | POA: Diagnosis not present

## 2016-07-22 DIAGNOSIS — Z87891 Personal history of nicotine dependence: Secondary | ICD-10-CM | POA: Diagnosis not present

## 2016-07-22 DIAGNOSIS — Z951 Presence of aortocoronary bypass graft: Secondary | ICD-10-CM | POA: Diagnosis not present

## 2016-07-22 DIAGNOSIS — I213 ST elevation (STEMI) myocardial infarction of unspecified site: Secondary | ICD-10-CM | POA: Diagnosis not present

## 2016-07-22 DIAGNOSIS — E785 Hyperlipidemia, unspecified: Secondary | ICD-10-CM | POA: Insufficient documentation

## 2016-07-22 DIAGNOSIS — I701 Atherosclerosis of renal artery: Secondary | ICD-10-CM | POA: Diagnosis not present

## 2016-07-22 DIAGNOSIS — Z7984 Long term (current) use of oral hypoglycemic drugs: Secondary | ICD-10-CM | POA: Diagnosis not present

## 2016-07-23 ENCOUNTER — Other Ambulatory Visit: Payer: Self-pay | Admitting: Cardiology

## 2016-07-25 ENCOUNTER — Encounter (HOSPITAL_COMMUNITY): Payer: Medicare Other

## 2016-07-26 NOTE — Telephone Encounter (Signed)
Rx(s) sent to pharmacy electronically.  

## 2016-07-27 ENCOUNTER — Encounter (HOSPITAL_COMMUNITY)
Admission: RE | Admit: 2016-07-27 | Discharge: 2016-07-27 | Disposition: A | Discharge status: Home / Self Care | Payer: Medicare Other | Source: Ambulatory Visit | Attending: Cardiology | Admitting: Cardiology

## 2016-07-27 DIAGNOSIS — Z79899 Other long term (current) drug therapy: Secondary | ICD-10-CM | POA: Diagnosis not present

## 2016-07-27 DIAGNOSIS — I213 ST elevation (STEMI) myocardial infarction of unspecified site: Secondary | ICD-10-CM

## 2016-07-27 DIAGNOSIS — N390 Urinary tract infection, site not specified: Secondary | ICD-10-CM | POA: Diagnosis not present

## 2016-07-27 DIAGNOSIS — Z7984 Long term (current) use of oral hypoglycemic drugs: Secondary | ICD-10-CM | POA: Diagnosis not present

## 2016-07-27 DIAGNOSIS — R8299 Other abnormal findings in urine: Secondary | ICD-10-CM | POA: Diagnosis not present

## 2016-07-27 DIAGNOSIS — Z951 Presence of aortocoronary bypass graft: Secondary | ICD-10-CM

## 2016-07-27 DIAGNOSIS — E785 Hyperlipidemia, unspecified: Secondary | ICD-10-CM | POA: Diagnosis not present

## 2016-07-27 DIAGNOSIS — Z7982 Long term (current) use of aspirin: Secondary | ICD-10-CM | POA: Diagnosis not present

## 2016-07-29 ENCOUNTER — Encounter (HOSPITAL_COMMUNITY): Payer: Medicare Other

## 2016-08-01 ENCOUNTER — Encounter (HOSPITAL_COMMUNITY)
Admission: RE | Admit: 2016-08-01 | Discharge: 2016-08-01 | Disposition: A | Discharge status: Home / Self Care | Payer: Medicare Other | Source: Ambulatory Visit | Attending: Cardiology | Admitting: Cardiology

## 2016-08-01 DIAGNOSIS — I213 ST elevation (STEMI) myocardial infarction of unspecified site: Secondary | ICD-10-CM | POA: Diagnosis not present

## 2016-08-01 DIAGNOSIS — E785 Hyperlipidemia, unspecified: Secondary | ICD-10-CM | POA: Diagnosis not present

## 2016-08-01 DIAGNOSIS — Z7982 Long term (current) use of aspirin: Secondary | ICD-10-CM | POA: Diagnosis not present

## 2016-08-01 DIAGNOSIS — Z79899 Other long term (current) drug therapy: Secondary | ICD-10-CM | POA: Diagnosis not present

## 2016-08-01 DIAGNOSIS — Z951 Presence of aortocoronary bypass graft: Secondary | ICD-10-CM

## 2016-08-01 DIAGNOSIS — Z7984 Long term (current) use of oral hypoglycemic drugs: Secondary | ICD-10-CM | POA: Diagnosis not present

## 2016-08-03 ENCOUNTER — Other Ambulatory Visit: Payer: Self-pay | Admitting: *Deleted

## 2016-08-03 ENCOUNTER — Encounter (HOSPITAL_COMMUNITY)
Admission: RE | Admit: 2016-08-03 | Discharge: 2016-08-03 | Disposition: A | Discharge status: Home / Self Care | Payer: Medicare Other | Source: Ambulatory Visit | Attending: Cardiology | Admitting: Cardiology

## 2016-08-03 DIAGNOSIS — I213 ST elevation (STEMI) myocardial infarction of unspecified site: Secondary | ICD-10-CM

## 2016-08-03 DIAGNOSIS — Z7984 Long term (current) use of oral hypoglycemic drugs: Secondary | ICD-10-CM | POA: Diagnosis not present

## 2016-08-03 DIAGNOSIS — Z951 Presence of aortocoronary bypass graft: Secondary | ICD-10-CM | POA: Diagnosis not present

## 2016-08-03 DIAGNOSIS — E785 Hyperlipidemia, unspecified: Secondary | ICD-10-CM | POA: Diagnosis not present

## 2016-08-03 DIAGNOSIS — Z7982 Long term (current) use of aspirin: Secondary | ICD-10-CM | POA: Diagnosis not present

## 2016-08-03 DIAGNOSIS — Z79899 Other long term (current) drug therapy: Secondary | ICD-10-CM | POA: Diagnosis not present

## 2016-08-03 MED ORDER — METOPROLOL SUCCINATE ER 25 MG PO TB24: 12.5000 mg | ORAL_TABLET | Freq: Every day | ORAL | 2 refills | Status: DC

## 2016-08-05 ENCOUNTER — Encounter (HOSPITAL_COMMUNITY)
Admission: RE | Admit: 2016-08-05 | Discharge: 2016-08-05 | Disposition: A | Discharge status: Home / Self Care | Payer: Medicare Other | Source: Ambulatory Visit | Attending: Cardiology | Admitting: Cardiology

## 2016-08-05 DIAGNOSIS — Z951 Presence of aortocoronary bypass graft: Secondary | ICD-10-CM

## 2016-08-05 DIAGNOSIS — Z79899 Other long term (current) drug therapy: Secondary | ICD-10-CM | POA: Diagnosis not present

## 2016-08-05 DIAGNOSIS — Z7982 Long term (current) use of aspirin: Secondary | ICD-10-CM | POA: Diagnosis not present

## 2016-08-05 DIAGNOSIS — E785 Hyperlipidemia, unspecified: Secondary | ICD-10-CM | POA: Diagnosis not present

## 2016-08-05 DIAGNOSIS — Z7984 Long term (current) use of oral hypoglycemic drugs: Secondary | ICD-10-CM | POA: Diagnosis not present

## 2016-08-05 DIAGNOSIS — I213 ST elevation (STEMI) myocardial infarction of unspecified site: Secondary | ICD-10-CM | POA: Diagnosis not present

## 2016-08-05 NOTE — Progress Notes (Signed)
Cardiac Individual Treatment Plan  Patient Details  Name: Hailey Wright MRN: [id number] Date of Birth: [birth year] Referring Provider:   Flowsheet Row CARDIAC REHAB PHASE II ORIENTATION from 04/26/2016 in [place]  Referring Provider  Minus Breeding MD      Initial Encounter Date:  [place] PHASE II ORIENTATION from 04/26/2016 in [place]  Date  04/26/16  Referring Provider  Minus Breeding MD      Visit Diagnosis: S/P CABG x 2  ST elevation (STEMI) myocardial infarction of unspecified site [facility])  Patient's Home Medications on Admission:  Current Outpatient Prescriptions:  .  aspirin 81 MG tablet, Take 81 mg by mouth daily., Disp: , Rfl:  .  atorvastatin (LIPITOR) 80 MG tablet, Take 80 mg by mouth daily., Disp: , Rfl:  .  calcium-vitamin D (OSCAL WITH D) 500-200 MG-UNIT per tablet, Take 1 tablet by mouth 2 (two) times daily., Disp: , Rfl:  .  carvedilol (COREG) 3.125 MG tablet, TAKE 1 TABLET TWICE A DAY, Disp: 180 tablet, Rfl: 3 .  clopidogrel (PLAVIX) 75 MG tablet, Take 1 tablet (75 mg total) by mouth daily., Disp: 30 tablet, Rfl: 6 .  Co-Enzyme Q10 200 MG CAPS, Take 1 capsule by mouth daily. , Disp: , Rfl:  .  lisinopril (PRINIVIL,ZESTRIL) 2.5 MG tablet, Take 1 tablet (2.5 mg total) by mouth daily., Disp: 30 tablet, Rfl: 3 .  Magnesium 500 MG TABS, Take 1 tablet by mouth daily. , Disp: , Rfl:  .  metFORMIN (GLUCOPHAGE-XR) 500 MG 24 hr tablet, Take 1 tablet by mouth every other day. , Disp: , Rfl:  .  metoprolol succinate (TOPROL XL) 25 MG 24 hr tablet, Take 0.5 tablets (12.5 mg total) by mouth daily. Hold medication if your SBP is less than 100 (top number), Disp: 45 tablet, Rfl: 2 .  Multiple Vitamin (MULTIVITAMIN) tablet, Take 1 tablet by mouth daily., Disp: , Rfl:   Past Medical History: Past Medical History:  Diagnosis Date  . Acute ST elevation myocardial infarction (STEMI) involving left  anterior descending coronary artery ([place]) 02/18/2016  . AICD (automatic cardioverter/defibrillator) present   . CAD (coronary artery disease) 06/04/2013  . Coronary artery thrombosis ([place]) 02/18/2016  . DVT (deep venous thrombosis) ([place]) 1980s   RLE  . Hepatitis A infection 1987  . Hyperlipidemia   . Ischemic cardiomyopathy    a. Echo 5/17: EF 25-30%, anteroseptal, anterior, anteroseptal and apical akinesis, possible inferior hypokinesis, grade 1 diastolic dysfunction, no evidence of thrombus, trivial AI, mild MR, mild LAE  . MI (myocardial infarction) ([place]) 1991   Inferior treated with TPA 1991, POBA RCA 1996  . Pneumonia 1980s X 1  . RAS (renal artery stenosis) ([facility])    Right renal stent 2005  . S/P Emergency CABG x 2 02/18/2016   LIMA to LAD, SVG to OM1, EVH via right thigh  . Type II diabetes mellitus ([place])   . Unstable angina ([place]) 02/17/2016    Tobacco Use: History  Smoking Status  . Former Smoker  . Packs/day: 1.00  . Years: 40.00  . Types: Cigarettes  . Quit date: 06/05/2011  Smokeless Tobacco  . Never Used    Labs: Recent Review Flowsheet Data    Labs for ITP Cardiac and Pulmonary Rehab Latest Ref Rng & Units 02/19/2016 02/19/2016 02/19/2016 02/19/2016 02/20/2016   PHART 7.350 - 7.450 7.405 7.331(L) 7.330(L) - -   PCO2ART 35.0 - 45.0 mmHg 38.5 44.0 42.9 - -  HCO3 20.0 - 24.0 mEq/L 24.0 23.1 22.6 - -   TCO2 0 - 100 mmol/L 25 24 24 23 24    ACIDBASEDEF 0.0 - 2.0 mmol/L 1.0 3.0(H) 3.0(H) - -   O2SAT % 97.0 96.0 93.0 - -      Capillary Blood Glucose: Lab Results  Component Value Date   GLUCAP 98 06/17/2016   GLUCAP 116 (H) 06/16/2016   GLUCAP 114 (H) 06/16/2016   GLUCAP 118 (H) 06/16/2016   GLUCAP 89 05/09/2016     Exercise Target Goals:    Exercise Program Goal: Individual exercise prescription set with THRR, safety & activity barriers. Participant demonstrates ability to understand and report RPE using BORG scale, to self-measure pulse accurately, and to  acknowledge the importance of the exercise prescription.  Exercise Prescription Goal: Starting with aerobic activity 30 plus minutes a day, 3 days per week for initial exercise prescription. Provide home exercise prescription and guidelines that participant acknowledges understanding prior to discharge.  Activity Barriers & Risk Stratification:     Activity Barriers & Cardiac Risk Stratification - 04/26/16 1506      Activity Barriers & Cardiac Risk Stratification   Activity Barriers None   Cardiac Risk Stratification High      6 Minute Walk:     6 Minute Walk    Row Name 04/26/16 1654         6 Minute Walk   Phase Initial     Distance 1700 feet     Walk Time 6 minutes     # of Rest Breaks 0     MPH 3.22     METS 3.65     RPE 13     VO2 Peak 12.79     Symptoms No     Resting HR 71 bpm     Resting BP 102/68     Max Ex. HR 85 bpm     Max Ex. BP 118/80     2 Minute Post BP 110/60        Initial Exercise Prescription:     Initial Exercise Prescription - 04/26/16 1600      Date of Initial Exercise RX and Referring Provider   Date 04/26/16   Referring Provider Minus Breeding MD     Treadmill   MPH 2.8   Grade 1   Minutes 10   METs 3.53     Bike   Level 1   Minutes 10   METs 3.7     NuStep   Level 3   Minutes 10   METs 2.8     Prescription Details   Frequency (times per week) 3   Duration Progress to 30 minutes of continuous aerobic without signs/symptoms of physical distress     Intensity   THRR 40-80% of Max Heartrate 61-122 bpm   Ratings of Perceived Exertion 11-13   Perceived Dyspnea 0-4     Progression   Progression Continue to progress workloads to maintain intensity without signs/symptoms of physical distress.     Resistance Training   Training Prescription Yes   Weight 2 lb   Reps 10-12      Perform Capillary Blood Glucose checks as needed.  Exercise Prescription Changes:      Exercise Prescription Changes    Row Name  05/23/16 0900 06/02/16 1600 06/15/16 1600 07/14/16 1200 07/29/16 1100     Exercise Review   Progression Yes Yes Yes No No     Response to Exercise   Blood Pressure (Admit)  104/68 98/66 106/70 112/67 98/60   Blood Pressure (Exercise) 104/70 124/74 122/70 110/52 118/62   Blood Pressure (Exit) 98/62 98/68 100/62 97/64 114/70   Heart Rate (Admit) 67 bpm 67 bpm 81 bpm 69 bpm 65 bpm   Heart Rate (Exercise) 100 bpm 99 bpm 101 bpm 93 bpm 81 bpm   Heart Rate (Exit) 63 bpm 65 bpm 70 bpm 60 bpm 61 bpm   Rating of Perceived Exertion (Exercise) 12 13 12 12 13    Duration Progress to 30 minutes of continuous aerobic without signs/symptoms of physical distress Progress to 30 minutes of continuous aerobic without signs/symptoms of physical distress Progress to 30 minutes of continuous aerobic without signs/symptoms of physical distress Progress to 30 minutes of continuous aerobic without signs/symptoms of physical distress Progress to 30 minutes of continuous aerobic without signs/symptoms of physical distress   Intensity THRR unchanged THRR unchanged THRR unchanged THRR unchanged THRR unchanged     Progression   Progression Continue to progress workloads to maintain intensity without signs/symptoms of physical distress. Continue to progress workloads to maintain intensity without signs/symptoms of physical distress. Continue to progress workloads to maintain intensity without signs/symptoms of physical distress. Continue to progress workloads to maintain intensity without signs/symptoms of physical distress. Continue to progress workloads to maintain intensity without signs/symptoms of physical distress.   Average METs 2.8 3.5 3.8 3.4 4     Resistance Training   Training Prescription Yes Yes Yes Yes Yes   Weight 3lbs 3lbs 3lbs 3lbs 3lbs   Reps 10-12 10-12 10-12 10-12 10-12     Treadmill   MPH 3 3 3 3  3.2   Grade 2 2 2 2 3    Minutes 10 10 10 10 10    METs 4.12 4.12 4.12 4.12 4.77     Bike   Level 1 1 1  1 1    Minutes 10 10 10 10 10    METs 3.7 3.7 3.7 3.7 3.84     NuStep   Level 4 4 4 4 4    Minutes 10 10 10 10 10    METs 3 3.4 3.7 3.2 3.3     Home Exercise Plan   Plans to continue exercise at [facility] (comment)  Reviewed on 05/11/16. Plans to go to [facility] (comment)  Reviewed on 05/11/16. Plans to go to [facility] (comment)  Reviewed on 05/11/16. Plans to go to [facility] (comment)  Reviewed on 05/11/16. Plans to go to [facility] (comment)  Reviewed on 05/11/16. Plans to go to [facility]   Frequency Add 2 additional days to program exercise sessions. Add 2 additional days to program exercise sessions. Add 2 additional days to program exercise sessions. Add 2 additional days to program exercise sessions. Add 2 additional days to program exercise sessions.      Exercise Comments:      Exercise Comments    Row Name 06/02/16 1628 07/14/16 1213 07/29/16 1105       Exercise Comments Reviewed METs and goals. Pt is tolerating exercise well; will continue to monitor exercise progression Reviewed MET's  Pt is tolerating exercise well; will continue to monitor exercise progression Reviewed MET's and goals. Pt is tolerating exercise well; will continue to monitor exercise progression        Discharge Exercise Prescription (Final Exercise Prescription Changes):     Exercise Prescription Changes - 07/29/16 1100      Exercise Review   Progression No  Response to Exercise   Blood Pressure (Admit) 98/60   Blood Pressure (Exercise) 118/62   Blood Pressure (Exit) 114/70   Heart Rate (Admit) 65 bpm   Heart Rate (Exercise) 81 bpm   Heart Rate (Exit) 61 bpm   Rating of Perceived Exertion (Exercise) 13   Duration Progress to 30 minutes of continuous aerobic without signs/symptoms of physical distress   Intensity THRR unchanged     Progression   Progression Continue to progress workloads to maintain  intensity without signs/symptoms of physical distress.   Average METs 4     Resistance Training   Training Prescription Yes   Weight 3lbs   Reps 10-12     Treadmill   MPH 3.2   Grade 3   Minutes 10   METs 4.77     Bike   Level 1   Minutes 10   METs 3.84     NuStep   Level 4   Minutes 10   METs 3.3     Home Exercise Plan   Plans to continue exercise at [facility] (comment)  Reviewed on 05/11/16. Plans to go to [facility]   Frequency Add 2 additional days to program exercise sessions.      Nutrition:  Target Goals: Understanding of nutrition guidelines, daily intake of sodium <1544m, cholesterol <2075m calories 30% from fat and 7% or less from saturated fats, daily to have 5 or more servings of fruits and vegetables.  Biometrics:     Pre Biometrics - 04/26/16 1656      Pre Biometrics   Height 5' 7"  (1.702 m)   Weight 156 lb 4.9 oz (70.9 kg)   Waist Circumference 32 inches   Hip Circumference 39.5 inches   Waist to Hip Ratio 0.81 %   BMI (Calculated) 24.5   Triceps Skinfold 33.5 mm   % Body Fat 36.8 %   Grip Strength 34.5 kg   Flexibility 12.25 in   Single Leg Stand 30 seconds       Nutrition Therapy Plan and Nutrition Goals:     Nutrition Therapy & Goals - 04/28/16 1018      Nutrition Therapy   Diet Carb Modified, Therapeutic Lifestyle Changes      Personal Nutrition Goals   Personal Goal #1 Maintain wt around 156 lb [place] regarding individualized specific dietary modifications aiming towards targeted core components such as weight, hypertension, lipid management, diabetes, heart failure and other comorbidities.   Expected Outcomes Short Term Goal: Understand basic principles of dietary content, such as calories, fat, sodium, cholesterol and nutrients.;Long Term Goal: Adherence to prescribed nutrition plan.      Nutrition Discharge: Nutrition Scores:      Nutrition Assessments - 04/28/16 1019      MEDFICTS Scores   Pre Score 0      Nutrition Goals Re-Evaluation:   Psychosocial: Target Goals: Acknowledge presence or absence of depression, maximize coping skills, provide positive support system. Participant is able to verbalize types and ability to use techniques and skills needed for reducing stress and depression.  Initial Review & Psychosocial Screening:     Initial Psych Review & Screening - 05/02/16 1002      Family Dynamics   Good Support System? Yes     Barriers   Psychosocial barriers to participate in program There are no identifiable barriers or psychosocial needs.     Screening Interventions   Interventions  Encouraged to exercise      Quality of Life Scores:     Quality of Life - 04/26/16 1700      Quality of Life Scores   Health/Function Pre 26.89 %   Socioeconomic Pre 30 %   Psych/Spiritual Pre 28.29 %   Family Pre 30 %   GLOBAL Pre 28.27 %      PHQ-9: Recent Review Flowsheet Data    Depression screen [facility] 2/9 05/02/2016   Decreased Interest 0   Down, Depressed, Hopeless 0   PHQ - 2 Score 0      Psychosocial Evaluation and Intervention:     Psychosocial Evaluation - 06/01/16 1730      Psychosocial Evaluation & Interventions   Interventions Stress management education;Relaxation education;Encouraged to exercise with the program and follow exercise prescription   Comments pt is exercising on her own at home. pt states she feels more comfortable and confident with exercising on her own.    Continued Psychosocial Services Needed Yes      Psychosocial Re-Evaluation:     Psychosocial Re-Evaluation    [place] Name 08/05/16 1204             Psychosocial Re-Evaluation   Interventions Stress management education;Relaxation education;Encouraged to attend Cardiac Rehabilitation for the exercise       Comments no psychosocial needs identified, no intervention necessary. pt reports she is very  pleased that she is able to be involved in home activites without discomfort, fatigue or dyspnea. She is especially pleased that she is able to change bed linens without difficulty or assistance from her husband. pt reports doing simple tasks such as scooping cat liter were difficult for her but she is able to tolerate now.        Continued Psychosocial Services Needed No          Vocational Rehabilitation: Provide vocational rehab assistance to qualifying candidates.   Vocational Rehab Evaluation & Intervention:     Vocational Rehab - 04/26/16 1541      Initial Vocational Rehab Evaluation & Intervention   Assessment shows need for Vocational Rehabilitation No      Education: Education Goals: Education classes will be provided on a weekly basis, covering required topics. Participant will state understanding/return demonstration of topics presented.  Learning Barriers/Preferences:     Learning Barriers/Preferences - 04/26/16 1540      Learning Barriers/Preferences   Learning Barriers None   Learning Preferences Group Instruction;Skilled Demonstration      Education Topics: Count Your Pulse:  -Group instruction provided by verbal instruction, demonstration, patient participation and written materials to support subject.  Instructors address importance of being able to find your pulse and how to count your pulse when at home without a heart monitor.  Patients get hands on experience counting their pulse with staff help and individually. Flowsheet Row CARDIAC REHAB PHASE II EXERCISE from 08/05/2016 in [place]  Date  05/27/16  Educator  Maurice Small RN  Instruction Review Code  2- meets goals/outcomes      Heart Attack, Angina, and Risk Factor Modification:  -Group instruction provided by verbal instruction, video, and written materials to support subject.  Instructors address signs and symptoms of angina and heart attacks.    Also discuss  risk factors for heart disease and how to make changes to improve heart health risk factors. Flowsheet Row CARDIAC REHAB PHASE II EXERCISE from 08/05/2016 in [place]  Date  07/13/16  Instruction  Review Code  2- meets goals/outcomes      Functional Fitness:  -Group instruction provided by verbal instruction, demonstration, patient participation, and written materials to support subject.  Instructors address safety measures for doing things around the house.  Discuss how to get up and down off the floor, how to pick things up properly, how to safely get out of a chair without assistance, and balance training. Flowsheet Row CARDIAC REHAB PHASE II EXERCISE from 08/05/2016 in [place]  Date  08/05/16  Instruction Review Code  2- meets goals/outcomes      Meditation and Mindfulness:  -Group instruction provided by verbal instruction, patient participation, and written materials to support subject.  Instructor addresses importance of mindfulness and meditation practice to help reduce stress and improve awareness.  Instructor also leads participants through a meditation exercise.  Flowsheet Row CARDIAC REHAB PHASE II EXERCISE from 08/05/2016 in [place]  Date  07/20/16  Educator  RD  Instruction Review Code  2- meets goals/outcomes      Stretching for Flexibility and Mobility:  -Group instruction provided by verbal instruction, patient participation, and written materials to support subject.  Instructors lead participants through series of stretches that are designed to increase flexibility thus improving mobility.  These stretches are additional exercise for major muscle groups that are typically performed during regular warm up and cool down. Flowsheet Row CARDIAC REHAB PHASE II EXERCISE from 08/05/2016 in [place]  Date  05/11/16  Instruction Review Code  2-  meets goals/outcomes      Hands Only CPR Anytime:  -Group instruction provided by verbal instruction, video, patient participation and written materials to support subject.  Instructors co-teach with AHA video for hands only CPR.  Participants get hands on experience with mannequins. Flowsheet Row CARDIAC REHAB PHASE II EXERCISE from 08/05/2016 in [place]  Date  05/06/16  Instruction Review Code  2- meets goals/outcomes      Nutrition I class: Heart Healthy Eating:  -Group instruction provided by PowerPoint slides, verbal discussion, and written materials to support subject matter. The instructor gives an explanation and review of the Therapeutic Lifestyle Changes diet recommendations, which includes a discussion on lipid goals, dietary fat, sodium, fiber, plant stanol/sterol esters, sugar, and the components of a well-balanced, healthy diet. Flowsheet Row CARDIAC REHAB PHASE II EXERCISE from 08/05/2016 in [place]  Date  06/21/16  Educator  RD  Instruction Review Code  2- meets goals/outcomes      Nutrition II class: Lifestyle Skills:  -Group instruction provided by PowerPoint slides, verbal discussion, and written materials to support subject matter. The instructor gives an explanation and review of label reading, grocery shopping for heart health, heart healthy recipe modifications, and ways to make healthier choices when eating out. Flowsheet Row CARDIAC REHAB PHASE II EXERCISE from 08/05/2016 in [place]  Date  06/14/16  Educator  RD  Instruction Review Code  2- meets goals/outcomes      Diabetes Question & Answer:  -Group instruction provided by PowerPoint slides, verbal discussion, and written materials to support subject matter. The instructor gives an explanation and review of diabetes co-morbidities, pre- and post-prandial blood glucose goals, pre-exercise blood glucose goals,  signs, symptoms, and treatment of hypoglycemia and hyperglycemia, and foot care basics.   Diabetes Blitz:  -Group instruction provided by [employer], verbal discussion, and written materials to support  subject matter. The instructor gives an explanation and review of the physiology behind type 1 and type 2 diabetes, diabetes medications and rational behind using different medications, pre- and post-prandial blood glucose recommendations and Hemoglobin A1c goals, diabetes diet, and exercise including blood glucose guidelines for exercising safely.    Portion Distortion:  -Group instruction provided by PowerPoint slides, verbal discussion, written materials, and food models to support subject matter. The instructor gives an explanation of serving size versus portion size, changes in portions sizes over the last 20 years, and what consists of a serving from each food group. Flowsheet Row CARDIAC REHAB PHASE II EXERCISE from 08/05/2016 in [place]  Date  05/18/16  Educator  RD  Instruction Review Code  2- meets goals/outcomes      Stress Management:  -Group instruction provided by verbal instruction, video, and written materials to support subject matter.  Instructors review role of stress in heart disease and how to cope with stress positively.     Exercising on Your Own:  -Group instruction provided by verbal instruction, power point, and written materials to support subject.  Instructors discuss benefits of exercise, components of exercise, frequency and intensity of exercise, and end points for exercise.  Also discuss use of nitroglycerin and activating EMS.  Review options of places to exercise outside of rehab.  Review guidelines for sex with heart disease.   Cardiac Drugs I:  -Group instruction provided by verbal instruction and written materials to support subject.  Instructor reviews cardiac drug classes: antiplatelets, anticoagulants, beta  blockers, and statins.  Instructor discusses reasons, side effects, and lifestyle considerations for each drug class. Flowsheet Row CARDIAC REHAB PHASE II EXERCISE from 08/05/2016 in [place]  Date  05/04/16  Educator  Pharm D  Instruction Review Code  2- meets goals/outcomes      Cardiac Drugs II:  -Group instruction provided by verbal instruction and written materials to support subject.  Instructor reviews cardiac drug classes: angiotensin converting enzyme inhibitors (ACE-I), angiotensin II receptor blockers (ARBs), nitrates, and calcium channel blockers.  Instructor discusses reasons, side effects, and lifestyle considerations for each drug class. Flowsheet Row CARDIAC REHAB PHASE II EXERCISE from 08/05/2016 in [place]  Date  08/03/16  Instruction Review Code  2- meets goals/outcomes      Anatomy and Physiology of the Circulatory System:  -Group instruction provided by verbal instruction, video, and written materials to support subject.  Reviews functional anatomy of heart, how it relates to various diagnoses, and what role the heart plays in the overall system. Flowsheet Row CARDIAC REHAB PHASE II EXERCISE from 08/05/2016 in [place]  Date  07/27/16  Instruction Review Code  2- meets goals/outcomes      Knowledge Questionnaire Score:     Knowledge Questionnaire Score - 04/26/16 1655      Knowledge Questionnaire Score   Pre Score 23/24      Core Components/Risk Factors/Patient Goals at Admission:     Personal Goals and Risk Factors at Admission - 04/26/16 1541      Core Components/Risk Factors/Patient Goals on Admission   Diabetes Yes   Intervention Provide education about proper nutrition, including hydration, and aerobic/resistive exercise prescription along with prescribed medications to achieve blood glucose in normal ranges: Fasting glucose 65-99 mg/dL   Expected  Outcomes Short Term: Participant verbalizes understanding of the signs/symptoms and immediate care of hyper/hypoglycemia, proper foot care and importance of  medication, aerobic/resistive exercise and nutrition plan for blood glucose control.   Lipids Yes   Expected Outcomes Short Term: Participant states understanding of desired cholesterol values and is compliant with medications prescribed. Participant is following exercise prescription and nutrition guidelines.;Long Term: Cholesterol controlled with medications as prescribed, with individualized exercise RX and with personalized nutrition plan. Value goals: LDL < 58m, HDL > 40 mg.      Core Components/Risk Factors/Patient Goals Review:      Goals and Risk Factor Review    Row Name 06/02/16 1629 07/27/16 1227           Core Components/Risk Factors/Patient Goals Review   Personal Goals Review Other Other      Review Feeling good overall; really enjoy the nutrition classes and is getting good nutrition information Feeling better than before cardiac event. Learning more about nutrition and feeling less fatigue throughtout the day      Expected Outcomes Pt will take nutrition information and apply to lifestyle to live a more heart healthy life Pt will continue to feel good and have less fatigue.         Core Components/Risk Factors/Patient Goals at Discharge (Final Review):      Goals and Risk Factor Review - 07/27/16 1227      Core Components/Risk Factors/Patient Goals Review   Personal Goals Review Other   Review Feeling better than before cardiac event. Learning more about nutrition and feeling less fatigue throughtout the day   Expected Outcomes Pt will continue to feel good and have less fatigue.      ITP Comments:     ITP Comments    Row Name 04/26/16 1539           ITP Comments Dr. TFransico Him Medical Director           Comments: Pt is making expected progress toward personal goals after completing 26 sessions.  Recommend continued exercise and life style modification education including  stress management and relaxation techniques to decrease cardiac risk profile.

## 2016-08-10 ENCOUNTER — Encounter (HOSPITAL_COMMUNITY)
Admission: RE | Admit: 2016-08-10 | Discharge: 2016-08-10 | Disposition: A | Discharge status: Home / Self Care | Payer: Medicare Other | Source: Ambulatory Visit | Attending: Cardiology | Admitting: Cardiology

## 2016-08-10 DIAGNOSIS — I213 ST elevation (STEMI) myocardial infarction of unspecified site: Secondary | ICD-10-CM

## 2016-08-10 DIAGNOSIS — Z951 Presence of aortocoronary bypass graft: Secondary | ICD-10-CM | POA: Diagnosis not present

## 2016-08-10 DIAGNOSIS — E785 Hyperlipidemia, unspecified: Secondary | ICD-10-CM | POA: Diagnosis not present

## 2016-08-10 DIAGNOSIS — Z7984 Long term (current) use of oral hypoglycemic drugs: Secondary | ICD-10-CM | POA: Diagnosis not present

## 2016-08-10 DIAGNOSIS — Z7982 Long term (current) use of aspirin: Secondary | ICD-10-CM | POA: Diagnosis not present

## 2016-08-10 DIAGNOSIS — Z79899 Other long term (current) drug therapy: Secondary | ICD-10-CM | POA: Diagnosis not present

## 2016-08-12 ENCOUNTER — Encounter (HOSPITAL_COMMUNITY)
Admission: RE | Admit: 2016-08-12 | Discharge: 2016-08-12 | Disposition: A | Discharge status: Home / Self Care | Payer: Medicare Other | Source: Ambulatory Visit | Attending: Cardiology | Admitting: Cardiology

## 2016-08-12 DIAGNOSIS — Z7984 Long term (current) use of oral hypoglycemic drugs: Secondary | ICD-10-CM | POA: Diagnosis not present

## 2016-08-12 DIAGNOSIS — I213 ST elevation (STEMI) myocardial infarction of unspecified site: Secondary | ICD-10-CM

## 2016-08-12 DIAGNOSIS — Z951 Presence of aortocoronary bypass graft: Secondary | ICD-10-CM

## 2016-08-12 DIAGNOSIS — E785 Hyperlipidemia, unspecified: Secondary | ICD-10-CM | POA: Diagnosis not present

## 2016-08-12 DIAGNOSIS — Z79899 Other long term (current) drug therapy: Secondary | ICD-10-CM | POA: Diagnosis not present

## 2016-08-12 DIAGNOSIS — Z7982 Long term (current) use of aspirin: Secondary | ICD-10-CM | POA: Diagnosis not present

## 2016-08-15 ENCOUNTER — Encounter (HOSPITAL_COMMUNITY)
Admission: RE | Admit: 2016-08-15 | Discharge: 2016-08-15 | Disposition: A | Discharge status: Home / Self Care | Payer: Medicare Other | Source: Ambulatory Visit | Attending: Cardiology | Admitting: Cardiology

## 2016-08-15 DIAGNOSIS — I213 ST elevation (STEMI) myocardial infarction of unspecified site: Secondary | ICD-10-CM

## 2016-08-15 DIAGNOSIS — Z7984 Long term (current) use of oral hypoglycemic drugs: Secondary | ICD-10-CM | POA: Diagnosis not present

## 2016-08-15 DIAGNOSIS — E785 Hyperlipidemia, unspecified: Secondary | ICD-10-CM | POA: Diagnosis not present

## 2016-08-15 DIAGNOSIS — Z951 Presence of aortocoronary bypass graft: Secondary | ICD-10-CM | POA: Diagnosis not present

## 2016-08-15 DIAGNOSIS — Z79899 Other long term (current) drug therapy: Secondary | ICD-10-CM | POA: Diagnosis not present

## 2016-08-15 DIAGNOSIS — Z7982 Long term (current) use of aspirin: Secondary | ICD-10-CM | POA: Diagnosis not present

## 2016-08-17 ENCOUNTER — Encounter (HOSPITAL_COMMUNITY)
Admission: RE | Admit: 2016-08-17 | Discharge: 2016-08-17 | Disposition: A | Discharge status: Home / Self Care | Payer: Medicare Other | Source: Ambulatory Visit | Attending: Cardiology | Admitting: Cardiology

## 2016-08-17 DIAGNOSIS — E785 Hyperlipidemia, unspecified: Secondary | ICD-10-CM | POA: Diagnosis not present

## 2016-08-17 DIAGNOSIS — I213 ST elevation (STEMI) myocardial infarction of unspecified site: Secondary | ICD-10-CM

## 2016-08-17 DIAGNOSIS — Z951 Presence of aortocoronary bypass graft: Secondary | ICD-10-CM | POA: Diagnosis not present

## 2016-08-17 DIAGNOSIS — Z7984 Long term (current) use of oral hypoglycemic drugs: Secondary | ICD-10-CM | POA: Diagnosis not present

## 2016-08-17 DIAGNOSIS — Z7982 Long term (current) use of aspirin: Secondary | ICD-10-CM | POA: Diagnosis not present

## 2016-08-17 DIAGNOSIS — Z79899 Other long term (current) drug therapy: Secondary | ICD-10-CM | POA: Diagnosis not present

## 2016-08-19 ENCOUNTER — Encounter (HOSPITAL_COMMUNITY)
Admission: RE | Admit: 2016-08-19 | Discharge: 2016-08-19 | Disposition: A | Discharge status: Home / Self Care | Payer: Medicare Other | Source: Ambulatory Visit | Attending: Cardiology | Admitting: Cardiology

## 2016-08-19 DIAGNOSIS — Z7982 Long term (current) use of aspirin: Secondary | ICD-10-CM | POA: Diagnosis not present

## 2016-08-19 DIAGNOSIS — E785 Hyperlipidemia, unspecified: Secondary | ICD-10-CM | POA: Diagnosis not present

## 2016-08-19 DIAGNOSIS — Z79899 Other long term (current) drug therapy: Secondary | ICD-10-CM | POA: Diagnosis not present

## 2016-08-19 DIAGNOSIS — I213 ST elevation (STEMI) myocardial infarction of unspecified site: Secondary | ICD-10-CM | POA: Diagnosis not present

## 2016-08-19 DIAGNOSIS — Z7984 Long term (current) use of oral hypoglycemic drugs: Secondary | ICD-10-CM | POA: Diagnosis not present

## 2016-08-19 DIAGNOSIS — Z951 Presence of aortocoronary bypass graft: Secondary | ICD-10-CM

## 2016-08-22 ENCOUNTER — Encounter (HOSPITAL_COMMUNITY)
Admission: RE | Admit: 2016-08-22 | Discharge: 2016-08-22 | Disposition: A | Discharge status: Home / Self Care | Payer: Medicare Other | Source: Ambulatory Visit | Attending: Cardiology | Admitting: Cardiology

## 2016-08-22 DIAGNOSIS — I2510 Atherosclerotic heart disease of native coronary artery without angina pectoris: Secondary | ICD-10-CM | POA: Diagnosis not present

## 2016-08-22 DIAGNOSIS — Z7984 Long term (current) use of oral hypoglycemic drugs: Secondary | ICD-10-CM | POA: Diagnosis not present

## 2016-08-22 DIAGNOSIS — I213 ST elevation (STEMI) myocardial infarction of unspecified site: Secondary | ICD-10-CM | POA: Diagnosis not present

## 2016-08-22 DIAGNOSIS — E785 Hyperlipidemia, unspecified: Secondary | ICD-10-CM | POA: Insufficient documentation

## 2016-08-22 DIAGNOSIS — Z87891 Personal history of nicotine dependence: Secondary | ICD-10-CM | POA: Insufficient documentation

## 2016-08-22 DIAGNOSIS — Z7982 Long term (current) use of aspirin: Secondary | ICD-10-CM | POA: Insufficient documentation

## 2016-08-22 DIAGNOSIS — I701 Atherosclerosis of renal artery: Secondary | ICD-10-CM | POA: Insufficient documentation

## 2016-08-22 DIAGNOSIS — Z951 Presence of aortocoronary bypass graft: Secondary | ICD-10-CM | POA: Diagnosis not present

## 2016-08-22 DIAGNOSIS — Z79899 Other long term (current) drug therapy: Secondary | ICD-10-CM | POA: Insufficient documentation

## 2016-08-24 ENCOUNTER — Encounter (HOSPITAL_COMMUNITY): Payer: Self-pay

## 2016-08-24 ENCOUNTER — Encounter (HOSPITAL_COMMUNITY)
Admission: RE | Admit: 2016-08-24 | Discharge: 2016-08-24 | Disposition: A | Discharge status: Home / Self Care | Payer: Medicare Other | Source: Ambulatory Visit | Attending: Cardiology | Admitting: Cardiology

## 2016-08-24 VITALS — Wt 146.2 lb

## 2016-08-24 DIAGNOSIS — Z951 Presence of aortocoronary bypass graft: Secondary | ICD-10-CM

## 2016-08-24 DIAGNOSIS — I213 ST elevation (STEMI) myocardial infarction of unspecified site: Secondary | ICD-10-CM | POA: Diagnosis not present

## 2016-08-24 DIAGNOSIS — Z7982 Long term (current) use of aspirin: Secondary | ICD-10-CM | POA: Diagnosis not present

## 2016-08-24 DIAGNOSIS — Z7984 Long term (current) use of oral hypoglycemic drugs: Secondary | ICD-10-CM | POA: Diagnosis not present

## 2016-08-24 DIAGNOSIS — Z79899 Other long term (current) drug therapy: Secondary | ICD-10-CM | POA: Diagnosis not present

## 2016-08-24 DIAGNOSIS — E785 Hyperlipidemia, unspecified: Secondary | ICD-10-CM | POA: Diagnosis not present

## 2016-08-24 NOTE — Progress Notes (Signed)
Cardiac Individual Treatment Plan  Patient Details  Name: Hailey Wright MRN: [id number] Date of Birth: [birth year] Referring Provider:   Flowsheet Row CARDIAC REHAB PHASE II ORIENTATION from 04/26/2016 in [place]  Referring Provider  Minus Breeding MD      Initial Encounter Date:  [place] PHASE II ORIENTATION from 04/26/2016 in [place]  Date  04/26/16  Referring Provider  Minus Breeding MD      Visit Diagnosis: S/P CABG x 2  Patient's Home Medications on Admission:  Current Outpatient Prescriptions:  .  aspirin 81 MG tablet, Take 81 mg by mouth daily., Disp: , Rfl:  .  atorvastatin (LIPITOR) 80 MG tablet, Take 80 mg by mouth daily., Disp: , Rfl:  .  calcium-vitamin D (OSCAL WITH D) 500-200 MG-UNIT per tablet, Take 1 tablet by mouth 2 (two) times daily., Disp: , Rfl:  .  clopidogrel (PLAVIX) 75 MG tablet, Take 1 tablet (75 mg total) by mouth daily., Disp: 30 tablet, Rfl: 6 .  Co-Enzyme Q10 200 MG CAPS, Take 1 capsule by mouth daily. , Disp: , Rfl:  .  lisinopril (PRINIVIL,ZESTRIL) 2.5 MG tablet, Take 1 tablet (2.5 mg total) by mouth daily., Disp: 30 tablet, Rfl: 3 .  Magnesium 500 MG TABS, Take 1 tablet by mouth daily. , Disp: , Rfl:  .  metFORMIN (GLUCOPHAGE-XR) 500 MG 24 hr tablet, Take 1 tablet by mouth every other day. , Disp: , Rfl:  .  metoprolol succinate (TOPROL XL) 25 MG 24 hr tablet, Take 0.5 tablets (12.5 mg total) by mouth daily. Hold medication if your SBP is less than 100 (top number), Disp: 45 tablet, Rfl: 2 .  Multiple Vitamin (MULTIVITAMIN) tablet, Take 1 tablet by mouth daily., Disp: , Rfl:  .  NITROSTAT 0.4 MG SL tablet, Place 1 tablet under the tongue every 5 (five) minutes x 3 doses as needed for chest pain., Disp: , Rfl:   Past Medical History: Past Medical History:  Diagnosis Date  . Acute ST elevation myocardial infarction (STEMI) involving left anterior descending coronary  artery ([place]) 02/18/2016  . AICD (automatic cardioverter/defibrillator) present   . CAD (coronary artery disease) 06/04/2013  . Coronary artery thrombosis ([place]) 02/18/2016  . DVT (deep venous thrombosis) ([place]) 1980s   RLE  . Hepatitis A infection 1987  . Hyperlipidemia   . Ischemic cardiomyopathy    a. Echo 5/17: EF 25-30%, anteroseptal, anterior, anteroseptal and apical akinesis, possible inferior hypokinesis, grade 1 diastolic dysfunction, no evidence of thrombus, trivial AI, mild MR, mild LAE  . MI (myocardial infarction) 1991   Inferior treated with TPA 1991, POBA RCA 1996  . Pneumonia 1980s X 1  . RAS (renal artery stenosis) ([facility])    Right renal stent 2005  . S/P Emergency CABG x 2 02/18/2016   LIMA to LAD, SVG to OM1, EVH via right thigh  . Type II diabetes mellitus ([place])   . Unstable angina ([place]) 02/17/2016    Tobacco Use: History  Smoking Status  . Former Smoker  . Packs/day: 1.00  . Years: 40.00  . Types: Cigarettes  . Quit date: 06/05/2011  Smokeless Tobacco  . Never Used    Labs: Recent Review Flowsheet Data    Labs for ITP Cardiac and Pulmonary Rehab Latest Ref Rng & Units 02/19/2016 02/19/2016 02/19/2016 02/19/2016 02/20/2016   PHART 7.350 - 7.450 7.405 7.331(L) 7.330(L) - -   PCO2ART 35.0 - 45.0 mmHg 38.5 44.0 42.9 - -  HCO3 20.0 - 24.0 mEq/L 24.0 23.1 22.6 - -   TCO2 0 - 100 mmol/L _0 ACIDBASEDEF 0.0 - 2.0 mmol/L 1.0 3.0(H) 3.0(H) - -   O2SAT % 97.0 96.0 93.0 - -      Capillary Blood Glucose: Lab Results  Component Value Date   GLUCAP 98 06/17/2016   GLUCAP 116 (H) 06/16/2016   GLUCAP 114 (H) 06/16/2016   GLUCAP 118 (H) 06/16/2016   GLUCAP 89 05/09/2016     Exercise Target Goals:    Exercise Program Goal: Individual exercise prescription set with THRR, safety & activity barriers. Participant demonstrates ability to understand and report RPE using BORG scale, to self-measure pulse accurately, and to acknowledge the importance of the exercise  prescription.  Exercise Prescription Goal: Starting with aerobic activity 30 plus minutes a day, 3 days per week for initial exercise prescription. Provide home exercise prescription and guidelines that participant acknowledges understanding prior to discharge.  Activity Barriers & Risk Stratification:     Activity Barriers & Cardiac Risk Stratification - 04/26/16 1506      Activity Barriers & Cardiac Risk Stratification   Activity Barriers None   Cardiac Risk Stratification High      6 Minute Walk:     6 Minute Walk    Row Name 04/26/16 1654 08/15/16 1710       6 Minute Walk   Phase Initial Discharge    Distance 1700 feet 1943 feet    Walk Time 6 minutes 6 minutes    # of Rest Breaks 0 0    MPH 3.22 3.67    METS 3.65 4.3    RPE 13 12    VO2 Peak 12.79 15.07    Symptoms No No    Resting HR 71 bpm 64 bpm    Resting BP 102/68 106/72    Max Ex. HR 85 bpm 96 bpm    Max Ex. BP 118/80 124/72    2 Minute Post BP 110/60 98/60       Initial Exercise Prescription:     Initial Exercise Prescription - 04/26/16 1600      Date of Initial Exercise RX and Referring Provider   Date 04/26/16   Referring Provider Minus Breeding MD     Treadmill   MPH 2.8   Grade 1   Minutes 10   METs 3.53     Bike   Level 1   Minutes 10   METs 3.7     NuStep   Level 3   Minutes 10   METs 2.8     Prescription Details   Frequency (times per week) 3   Duration Progress to 30 minutes of continuous aerobic without signs/symptoms of physical distress     Intensity   THRR 40-80% of Max Heartrate 61-122 bpm   Ratings of Perceived Exertion 11-13   Perceived Dyspnea 0-4     Progression   Progression Continue to progress workloads to maintain intensity without signs/symptoms of physical distress.     Resistance Training   Training Prescription Yes   Weight 2 lb   Reps 10-12      Perform Capillary Blood Glucose checks as needed.  Exercise Prescription Changes:       Exercise Prescription Changes    Row Name 05/23/16 0900 06/02/16 1600 06/15/16 1600 07/14/16 1200 07/29/16 1100     Exercise Review   Progression Yes Yes Yes No No     Response to Exercise  Blood Pressure (Admit) 104/68 98/66 106/70 112/67 98/60   Blood Pressure (Exercise) 104/70 124/74 122/70 110/52 118/62   Blood Pressure (Exit) 98/62 98/68 100/62 97/64 114/70   Heart Rate (Admit) 67 bpm 67 bpm 81 bpm 69 bpm 65 bpm   Heart Rate (Exercise) 100 bpm 99 bpm 101 bpm 93 bpm 81 bpm   Heart Rate (Exit) 63 bpm 65 bpm 70 bpm 60 bpm 61 bpm   Rating of Perceived Exertion (Exercise) _0 Duration Progress to 30 minutes of continuous aerobic without signs/symptoms of physical distress Progress to 30 minutes of continuous aerobic without signs/symptoms of physical distress Progress to 30 minutes of continuous aerobic without signs/symptoms of physical distress Progress to 30 minutes of continuous aerobic without signs/symptoms of physical distress Progress to 30 minutes of continuous aerobic without signs/symptoms of physical distress   Intensity _1      Progression   Progression Continue to progress workloads to maintain intensity without signs/symptoms of physical distress. Continue to progress workloads to maintain intensity without signs/symptoms of physical distress. Continue to progress workloads to maintain intensity without signs/symptoms of physical distress. Continue to progress workloads to maintain intensity without signs/symptoms of physical distress. Continue to progress workloads to maintain intensity without signs/symptoms of physical distress.   Average METs 2.8 3.5 3.8 3.4 4     Resistance Training   Training Prescription _2    Weight 3lbs 3lbs 3lbs 3lbs 3lbs   Reps 10-12 10-12 10-12 10-12 10-12     Treadmill   MPH _3 3.2   Grade _4 Minutes _5 METs 4.12  4.12 4.12 4.12 4.77     Bike   Level _6 Minutes _7 METs 3.7 3.7 3.7 3.7 3.84     NuStep   Level _8 Minutes _9 METs 3 3.4 3.7 3.2 3.3     Home Exercise Plan   Plans to continue exercise at [facility] (comment)  Reviewed on 05/11/16. Plans to go to [facility] (comment)  Reviewed on 05/11/16. Plans to go to [facility] (comment)  Reviewed on 05/11/16. Plans to go to [facility] (comment)  Reviewed on 05/11/16. Plans to go to [facility] (comment)  Reviewed on 05/11/16. Plans to go to [facility]   Frequency Add 2 additional days to program exercise sessions. Add 2 additional days to program exercise sessions. Add 2 additional days to program exercise sessions. Add 2 additional days to program exercise sessions. Add 2 additional days to program exercise sessions.   [place] Name 08/24/16 1028             Response to Exercise   Blood Pressure (Admit) 104/60       Blood Pressure (Exercise) 118/70       Blood Pressure (Exit) 108/70       Heart Rate (Admit) 65 bpm       Heart Rate (Exercise) 99 bpm       Heart Rate (Exit) 61 bpm       Rating of Perceived Exertion (Exercise) 12       Duration Progress to 30 minutes of continuous aerobic without signs/symptoms of physical distress  Intensity THRR New         Progression   Progression Continue to progress workloads to maintain intensity without signs/symptoms of physical distress.       Average METs 4.5         Resistance Training   Training Prescription Yes       Weight 4lbs       Reps 10-12         Treadmill   MPH 3.2       Grade 3       Minutes 10       METs 4.77         Bike   Level 1.4       Minutes 10       METs 5         NuStep   Level 4       Minutes 10       METs 3.6         Home Exercise Plan   Plans to continue exercise at [employer] (comment)  Plans to go [facility].        Frequency Add 2 additional days to program exercise sessions.          Exercise Comments:      Exercise Comments    Row Name 06/02/16 1628 07/14/16 1213 07/29/16 1105 09/09/16 1030     Exercise Comments Reviewed METs and goals. Pt is tolerating exercise well; will continue to monitor exercise progression Reviewed MET's  Pt is tolerating exercise well; will continue to monitor exercise progression Reviewed MET's and goals. Pt is tolerating exercise well; will continue to monitor exercise progression Pt completed 36 sessions of cardiac rehab and plans to continue exercise at [place] 3x/week and outdoor walking 2-3x/week. Discussed temperature and emergency precautions.       Discharge Exercise Prescription (Final Exercise Prescription Changes):     Exercise Prescription Changes - 08/24/16 1028      Response to Exercise   Blood Pressure (Admit) 104/60   Blood Pressure (Exercise) 118/70   Blood Pressure (Exit) 108/70   Heart Rate (Admit) 65 bpm   Heart Rate (Exercise) 99 bpm   Heart Rate (Exit) 61 bpm   Rating of Perceived Exertion (Exercise) 12   Duration Progress to 30 minutes of continuous aerobic without signs/symptoms of physical distress   Intensity THRR New     Progression   Progression Continue to progress workloads to maintain intensity without signs/symptoms of physical distress.   Average METs 4.5     Resistance Training   Training Prescription Yes   Weight 4lbs   Reps 10-12     Treadmill   MPH 3.2   Grade 3   Minutes 10   METs 4.77     Bike   Level 1.4   Minutes 10   METs 5     NuStep   Level 4   Minutes 10   METs 3.6     Home Exercise Plan   Plans to continue exercise at [employer] [employer] (comment)  Plans to go [facility].   Frequency Add 2 additional days to program exercise sessions.      Nutrition:  Target Goals: Understanding of nutrition guidelines, daily intake of sodium <1559m, cholesterol <2021m calories 30% from fat and 7% or  less from saturated fats, daily to have 5 or more servings of fruits and vegetables.  Biometrics:     Pre Biometrics - 04/26/16 1656  Pre Biometrics   Height _0  (1.702 m)   Weight 156 lb 4.9 oz (70.9 kg)   Waist Circumference 32 inches   Hip Circumference 39.5 inches   Waist to Hip Ratio 0.81 %   BMI (Calculated) 24.5   Triceps Skinfold 33.5 mm   % Body Fat 36.8 %   Grip Strength 34.5 kg   Flexibility 12.25 in   Single Leg Stand 30 seconds         Post Biometrics - 09/09/16 1422       Post  Biometrics   Weight 146 lb 2.6 oz (66.3 kg)   Waist Circumference 31.25 inches   Hip Circumference 39.5 inches   Waist to Hip Ratio 0.79 %   Triceps Skinfold 23 mm   % Body Fat 33.8 %   Grip Strength 36 kg   Flexibility 13 in   Single Leg Stand 30 seconds      Nutrition Therapy Plan and Nutrition Goals:     Nutrition Therapy & Goals - 04/28/16 1018      Nutrition Therapy   Diet Carb Modified, Therapeutic Lifestyle Changes      Personal Nutrition Goals   Personal Goal #1 Maintain wt around 156 lb while in [place], educate and counsel regarding individualized specific dietary modifications aiming towards targeted core components such as weight, hypertension, lipid management, diabetes, heart failure and other comorbidities.   Expected Outcomes Short Term Goal: Understand basic principles of dietary content, such as calories, fat, sodium, cholesterol and nutrients.;Long Term Goal: Adherence to prescribed nutrition plan.      Nutrition Discharge: Nutrition Scores:     Nutrition Assessments - 09/02/16 0945      MEDFICTS Scores   Pre Score 0   Post Score 0   Score Difference 0      Nutrition Goals Re-Evaluation:     Nutrition Goals Re-Evaluation    Row Name 09/12/16 0904             Personal Goal #1 Re-Evaluation   Personal Goal #1 Maintain wt around 156 lb while in Cardiac Rehab       Goal Progress  Seen No       Comments Pt wt is down ~11 lb since admission.         Weight   Current Weight 145 lb 3.2 oz (65.9 kg)          Psychosocial: Target Goals: Acknowledge presence or absence of depression, maximize coping skills, provide positive support system. Participant is able to verbalize types and ability to use techniques and skills needed for reducing stress and depression.  Initial Review & Psychosocial Screening:     Initial Psych Review & Screening - 05/02/16 1002      Family Dynamics   Good Support System? Yes     Barriers   Psychosocial barriers to participate in program There are no identifiable barriers or psychosocial needs.     Screening Interventions   Interventions Encouraged to exercise      Quality of Life Scores:     Quality of Life - 09/09/16 1423      Quality of Life Scores   Health/Function Pre 26.89 %   Health/Function Post 28 %   Health/Function % Change 4.13 %   Socioeconomic Pre 30 %   Socioeconomic Post 30 %   Socioeconomic % Change  0 %   Psych/Spiritual Pre 28.29 %   Psych/Spiritual Post  27.43 %   Psych/Spiritual % Change -3.04 %   Family Pre 30 %   Family Post 27.6 %   Family % Change -8 %   GLOBAL Pre 28.27 %   GLOBAL Post 28.24 %   GLOBAL % Change -0.11 %      PHQ-9: Recent Review Flowsheet Data    Depression screen [facility] 2/9 08/24/2016 05/02/2016   Decreased Interest 0 0   Down, Depressed, Hopeless 0 0   PHQ - 2 Score 0 0      Psychosocial Evaluation and Intervention:     Psychosocial Evaluation - 08/24/16 1008      Discharge Psychosocial Assessment & Intervention   Comments no psychosocial needs identified, no intervention necessary       Psychosocial Re-Evaluation:     Psychosocial Re-Evaluation    [place] Name 08/05/16 1204             Psychosocial Re-Evaluation   Interventions Stress management education;Relaxation education;Encouraged to attend Cardiac Rehabilitation for the exercise       Comments no  psychosocial needs identified, no intervention necessary. pt reports she is very pleased that she is able to be involved in home activites without discomfort, fatigue or dyspnea. She is especially pleased that she is able to change bed linens without difficulty or assistance from her husband. pt reports doing simple tasks such as scooping cat liter were difficult for her but she is able to tolerate now.        Continued Psychosocial Services Needed No          Vocational Rehabilitation: Provide vocational rehab assistance to qualifying candidates.   Vocational Rehab Evaluation & Intervention:     Vocational Rehab - 04/26/16 1541      Initial Vocational Rehab Evaluation & Intervention   Assessment shows need for Vocational Rehabilitation No      Education: Education Goals: Education classes will be provided on a weekly basis, covering required topics. Participant will state understanding/return demonstration of topics presented.  Learning Barriers/Preferences:     Learning Barriers/Preferences - 04/26/16 1540      Learning Barriers/Preferences   Learning Barriers None   Learning Preferences Group Instruction;Skilled Demonstration      Education Topics: Count Your Pulse:  -Group instruction provided by verbal instruction, demonstration, patient participation and written materials to support subject.  Instructors address importance of being able to find your pulse and how to count your pulse when at home without a heart monitor.  Patients get hands on experience counting their pulse with staff help and individually. Flowsheet Row CARDIAC REHAB PHASE II EXERCISE from 08/24/2016 in [place]  Date  05/27/16  Educator  Maurice Small RN  Instruction Review Code  2- meets goals/outcomes      Heart Attack, Angina, and Risk Factor Modification:  -Group instruction provided by verbal instruction, video, and written materials to support subject.   Instructors address signs and symptoms of angina and heart attacks.    Also discuss risk factors for heart disease and how to make changes to improve heart health risk factors. Flowsheet Row CARDIAC REHAB PHASE II EXERCISE from 08/24/2016 in [place]  Date  07/13/16  Instruction Review Code  2- meets goals/outcomes      Functional Fitness:  -Group instruction provided by verbal instruction, demonstration, patient participation, and written materials to support subject.  Instructors address safety measures for doing things around the house.  Discuss how to get up  and down off the floor, how to pick things up properly, how to safely get out of a chair without assistance, and balance training. Flowsheet Row CARDIAC REHAB PHASE II EXERCISE from 08/24/2016 in [place]  Date  08/05/16  Instruction Review Code  2- meets goals/outcomes      Meditation and Mindfulness:  -Group instruction provided by verbal instruction, patient participation, and written materials to support subject.  Instructor addresses importance of mindfulness and meditation practice to help reduce stress and improve awareness.  Instructor also leads participants through a meditation exercise.  Flowsheet Row CARDIAC REHAB PHASE II EXERCISE from 08/24/2016 in [place]  Date  07/20/16  Educator  RD  Instruction Review Code  2- meets goals/outcomes      Stretching for Flexibility and Mobility:  -Group instruction provided by verbal instruction, patient participation, and written materials to support subject.  Instructors lead participants through series of stretches that are designed to increase flexibility thus improving mobility.  These stretches are additional exercise for major muscle groups that are typically performed during regular warm up and cool down. Flowsheet Row CARDIAC REHAB PHASE II EXERCISE from 08/24/2016 in [place]  Date  08/19/16  Instruction Review Code  2- meets goals/outcomes      Hands Only CPR Anytime:  -Group instruction provided by verbal instruction, video, patient participation and written materials to support subject.  Instructors co-teach with AHA video for hands only CPR.  Participants get hands on experience with mannequins. Flowsheet Row CARDIAC REHAB PHASE II EXERCISE from 08/24/2016 in [place]  Date  05/06/16  Instruction Review Code  2- meets goals/outcomes      Nutrition I class: Heart Healthy Eating:  -Group instruction provided by PowerPoint slides, verbal discussion, and written materials to support subject matter. The instructor gives an explanation and review of the Therapeutic Lifestyle Changes diet recommendations, which includes a discussion on lipid goals, dietary fat, sodium, fiber, plant stanol/sterol esters, sugar, and the components of a well-balanced, healthy diet. Flowsheet Row CARDIAC REHAB PHASE II EXERCISE from 08/24/2016 in [place]  Date  06/21/16  Educator  RD  Instruction Review Code  2- meets goals/outcomes      Nutrition II class: Lifestyle Skills:  -Group instruction provided by PowerPoint slides, verbal discussion, and written materials to support subject matter. The instructor gives an explanation and review of label reading, grocery shopping for heart health, heart healthy recipe modifications, and ways to make healthier choices when eating out. Flowsheet Row CARDIAC REHAB PHASE II EXERCISE from 08/24/2016 in [place]  Date  06/14/16  Educator  RD  Instruction Review Code  2- meets goals/outcomes      Diabetes Question & Answer:  -Group instruction provided by PowerPoint slides, verbal discussion, and written materials to support subject matter. The instructor gives an explanation and review of diabetes co-morbidities,  pre- and post-prandial blood glucose goals, pre-exercise blood glucose goals, signs, symptoms, and treatment of hypoglycemia and hyperglycemia, and foot care basics.   Diabetes Blitz:  -Group instruction provided by PowerPoint slides, verbal discussion, and written materials to support subject matter. The instructor gives an explanation and review of the physiology behind type 1 and type 2 diabetes, diabetes medications and rational behind using different medications, pre- and post-prandial blood glucose recommendations and Hemoglobin A1c goals, diabetes diet, and exercise including blood glucose guidelines for  exercising safely.    Portion Distortion:  -Group instruction provided by PowerPoint slides, verbal discussion, written materials, and food models to support subject matter. The instructor gives an explanation of serving size versus portion size, changes in portions sizes over the last 20 years, and what consists of a serving from each food group. Flowsheet Row CARDIAC REHAB PHASE II EXERCISE from 08/24/2016 in [place]  Date  08/24/16  Educator  RD  Instruction Review Code  2- meets goals/outcomes      Stress Management:  -Group instruction provided by verbal instruction, video, and written materials to support subject matter.  Instructors review role of stress in heart disease and how to cope with stress positively.     Exercising on Your Own:  -Group instruction provided by verbal instruction, power point, and written materials to support subject.  Instructors discuss benefits of exercise, components of exercise, frequency and intensity of exercise, and end points for exercise.  Also discuss use of nitroglycerin and activating EMS.  Review options of places to exercise outside of rehab.  Review guidelines for sex with heart disease. Flowsheet Row CARDIAC REHAB PHASE II EXERCISE from 08/24/2016 in [place]  Date   08/10/16  Instruction Review Code  2- meets goals/outcomes      Cardiac Drugs I:  -Group instruction provided by verbal instruction and written materials to support subject.  Instructor reviews cardiac drug classes: antiplatelets, anticoagulants, beta blockers, and statins.  Instructor discusses reasons, side effects, and lifestyle considerations for each drug class. Flowsheet Row CARDIAC REHAB PHASE II EXERCISE from 08/24/2016 in [place]  Date  05/04/16  Educator  Pharm D  Instruction Review Code  2- meets goals/outcomes      Cardiac Drugs II:  -Group instruction provided by verbal instruction and written materials to support subject.  Instructor reviews cardiac drug classes: angiotensin converting enzyme inhibitors (ACE-I), angiotensin II receptor blockers (ARBs), nitrates, and calcium channel blockers.  Instructor discusses reasons, side effects, and lifestyle considerations for each drug class. Flowsheet Row CARDIAC REHAB PHASE II EXERCISE from 08/24/2016 in [place]  Date  08/03/16  Instruction Review Code  2- meets goals/outcomes      Anatomy and Physiology of the Circulatory System:  -Group instruction provided by verbal instruction, video, and written materials to support subject.  Reviews functional anatomy of heart, how it relates to various diagnoses, and what role the heart plays in the overall system. Flowsheet Row CARDIAC REHAB PHASE II EXERCISE from 08/24/2016 in [place]  Date  07/27/16  Instruction Review Code  2- meets goals/outcomes      Knowledge Questionnaire Score:     Knowledge Questionnaire Score - 08/24/16 0759      Knowledge Questionnaire Score   Post Score 23/24      Core Components/Risk Factors/Patient Goals at Admission:     Personal Goals and Risk Factors at Admission - 04/26/16 1541      Core Components/Risk Factors/Patient Goals on Admission    Diabetes Yes   Intervention Provide education about proper nutrition, including hydration, and aerobic/resistive exercise prescription along with prescribed medications to achieve blood glucose in normal ranges: Fasting glucose 65-99 mg/dL   Expected Outcomes Short Term: Participant verbalizes understanding of the signs/symptoms and immediate care of hyper/hypoglycemia, proper foot care and importance of medication, aerobic/resistive exercise and nutrition plan for blood glucose control.   Lipids Yes  Expected Outcomes Short Term: Participant states understanding of desired cholesterol values and is compliant with medications prescribed. Participant is following exercise prescription and nutrition guidelines.;Long Term: Cholesterol controlled with medications as prescribed, with individualized exercise RX and with personalized nutrition plan. Value goals: LDL < 12m, HDL > 40 mg.      Core Components/Risk Factors/Patient Goals Review:      Goals and Risk Factor Review    Row Name 06/02/16 1629 07/27/16 1227           Core Components/Risk Factors/Patient Goals Review   Personal Goals Review Other Other      Review Feeling good overall; really enjoy the nutrition classes and is getting good nutrition information Feeling better than before cardiac event. Learning more about nutrition and feeling less fatigue throughtout the day      Expected Outcomes Pt will take nutrition information and apply to lifestyle to live a more heart healthy life Pt will continue to feel good and have less fatigue.         Core Components/Risk Factors/Patient Goals at Discharge (Final Review):      Goals and Risk Factor Review - 07/27/16 1227      Core Components/Risk Factors/Patient Goals Review   Personal Goals Review Other   Review Feeling better than before cardiac event. Learning more about nutrition and feeling less fatigue throughtout the day   Expected Outcomes Pt will continue to feel good and  have less fatigue.      ITP Comments:     ITP Comments    Row Name 04/26/16 1539           ITP Comments Dr. TFransico Him Medical Director           Comments: Pt graduated from cardiac rehab program today with completion of 36 exercise sessions in Phase II. Pt maintained good attendance and progressed nicely during his participation in rehab as evidenced by increased MET level.   Medication list reconciled. Repeat  PHQ score-  .  Pt has made significant lifestyle changes and should be commended for her success. Pt feels she has achieved her  goals during cardiac rehab, which include regaining self confidence and physical strength/stamina.  Pt was concerned she would become a "cardiac cripple" and is very thankful that she has learned how to live a healthy, productive lifestyle that safely includes physical activity.    Pt plans to continue exercise in her own.

## 2016-08-26 ENCOUNTER — Encounter (HOSPITAL_COMMUNITY): Admission: RE | Admit: 2016-08-26 | Payer: Medicare Other | Source: Ambulatory Visit

## 2016-09-01 DIAGNOSIS — Z23 Encounter for immunization: Secondary | ICD-10-CM | POA: Diagnosis not present

## 2016-09-07 ENCOUNTER — Telehealth (HOSPITAL_COMMUNITY): Payer: Self-pay

## 2016-09-07 NOTE — Telephone Encounter (Signed)
-----   Message from Minus Breeding, MD sent at 08/18/2016  4:17 PM EDT ----- Regarding: RE: strength training request OK for strength training.    ----- Message ----- From: Lequita Halt Sent: 08/17/2016  11:13 AM To: Minus Breeding, MD Subject: strength training request                      Patient has been in cardiac rehab approximately 3 months and doing is well. Pt has been averaging 4.0 METS and BP/HR has been in stabled with exercise. Pt is interested in doing strength training. If you feel this patient is appropriate to begin strength training please f/u with cardiac rehab staff.   Thank you!   Stiles Maxcy [employer]

## 2016-09-08 ENCOUNTER — Encounter: Payer: Self-pay | Admitting: Cardiology

## 2016-09-12 NOTE — Addendum Note (Signed)
Encounter addended by: Jewel Baize, RD on: 09/12/2016  9:06 AM<BR>    Actions taken: Flowsheet data copied forward, Visit Navigator Flowsheet section accepted

## 2016-09-19 NOTE — Progress Notes (Signed)
Electrophysiology Office Note   Date:  09/20/2016   ID:  Hailey Wright, DOB 10/28/1946, MRN [id number]  PCP:  Donnajean Lopes, MD  Cardiologist:  Hochrein Primary Electrophysiologist:  Sharel Behne Meredith Leeds, MD    Chief Complaint  Patient presents with  . Cardiomyopathy     History of Present Illness: Hailey Wright is a 70 y.o. female who presents today for electrophysiology evaluation.   Hx CAD and ischemic cardiomyopathy. In March 2017, had emergent CABG with LIMA to the LAD and SVG to OM1. She is left with an ejection fraction of approximately 30%. BP meds have been unable to be titrated due to hypotension. Had ICD placed 06/17/16. Since that time has felt well without issues.  Has noted that her BP cuff at times says that her pulse is irregular but has no symptoms. Has had blood in her urine and has follow up in urology clinic upcoming. Has noted that she can walk without angina.  Today, she denies symptoms of palpitations, chest pain, PND, lower extremity edema, claudication, dizziness, presyncope, syncope, bleeding, or neurologic sequela. The patient is tolerating medications without difficulties and is otherwise without complaint today.    Past Medical History:  Diagnosis Date  . Acute ST elevation myocardial infarction (STEMI) involving left anterior descending coronary artery ([place]) 02/18/2016  . AICD (automatic cardioverter/defibrillator) present   . CAD (coronary artery disease) 06/04/2013  . 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 DVT (deep venous thrombosis) ([place]) 1980s   RLE  . Hepatitis A infection 1987  . Hyperlipidemia   . Ischemic cardiomyopathy    a. Echo 5/17: EF 25-30%, anteroseptal, anterior, anteroseptal and apical akinesis, possible inferior hypokinesis, grade 1 diastolic dysfunction, no evidence of thrombus, trivial AI, mild MR, mild LAE  . MI (myocardial infarction) 1991   Inferior treated with TPA 1991, POBA RCA 1996  . Pneumonia 1980s X 1  . RAS  (renal artery stenosis) ([facility])    Right renal stent 2005  . S/P Emergency CABG x 2 02/18/2016   LIMA to LAD, SVG to OM1, EVH via right thigh  . Type II diabetes mellitus ([place])   . Unstable angina ([place]) 02/17/2016   Past Surgical History:  Procedure Laterality Date  . CARDIAC CATHETERIZATION N/A 02/18/2016   Procedure: Left Heart Cath and Coronary Angiography;  Surgeon: Leonie Man, MD;  Location: [facility];  Service: Cardiovascular;  Laterality: N/A;  . CORONARY ANGIOPLASTY  1991  . CORONARY ARTERY BYPASS GRAFT N/A 02/18/2016   Procedure: CORONARY ARTERY BYPASS GRAFTING (CABG) times 2 using left internal mammary and right greater saphenous vein;  Surgeon: Rexene Alberts, MD;  Location: [facility];  Service: Open Heart Surgery;  Laterality: N/A;  . EP IMPLANTABLE DEVICE N/A 06/16/2016   Procedure: ICD Implant;  Surgeon: Leslie Jester Meredith Leeds, MD;  Location: [facility];  Service: Cardiovascular;  Laterality: N/A;  . RENAL ARTERY STENT Right 2005  . RHINOPLASTY Bilateral 1968  . TONSILLECTOMY AND ADENOIDECTOMY  1950s     Current Outpatient Prescriptions  Medication Sig Dispense Refill  . aspirin 81 MG tablet Take 81 mg by mouth daily.    Marland Kitchen atorvastatin (LIPITOR) 80 MG tablet Take 80 mg by mouth daily.    . calcium-vitamin D (OSCAL WITH D) 500-200 MG-UNIT per tablet Take 1 tablet by mouth 2 (two) times daily.    . clopidogrel (PLAVIX) 75 MG tablet Take 1 tablet (75 mg total) by mouth daily. 30 tablet 6  . Co-Enzyme Q10  200 MG CAPS Take 1 capsule by mouth daily.     Marland Kitchen lisinopril (PRINIVIL,ZESTRIL) 2.5 MG tablet Take 1 tablet (2.5 mg total) by mouth daily. 30 tablet 3  . Magnesium 500 MG TABS Take 1 tablet by mouth daily.     . metFORMIN (GLUCOPHAGE-XR) 500 MG 24 hr tablet Take 1 tablet by mouth every other day.     . metoprolol succinate (TOPROL XL) 25 MG 24 hr tablet Take 0.5 tablets (12.5 mg total) by mouth daily. Hold medication if your SBP is less than 100 (top number) 45  tablet 2  . Multiple Vitamin (MULTIVITAMIN) tablet Take 1 tablet by mouth daily.    Marland Kitchen NITROSTAT 0.4 MG SL tablet Place 1 tablet under the tongue every 5 (five) minutes x 3 doses as needed for chest pain.     No current facility-administered medications for this visit.     Allergies:   Review of patient's allergies indicates no known allergies.   Social History:  The patient  reports that she quit smoking about 5 years ago. Her smoking use included Cigarettes. She has a 40.00 pack-year smoking history. She has never used smokeless tobacco. She reports that she does not drink alcohol or use drugs.   Family History:  The patient's family history includes Heart attack (age of onset: 68) in her brother; Heart attack (age of onset: 76) in her father.    ROS:  Please see the history of present illness.   Otherwise, review of systems is positive for palpitations, cough, blood in urine, easy bruising.   All other systems are reviewed and negative.    PHYSICAL EXAM: VS:  BP 108/70   Pulse (!) 58   Ht 5' 6.5" (1.689 m)   Wt 144 lb 6.4 oz (65.5 kg)   LMP  (LMP Unknown)   SpO2 98%   BMI 22.96 kg/m  , BMI Body mass index is 22.96 kg/m. GEN: Well nourished, well developed, in no acute distress  HEENT: normal  Neck: no JVD, carotid bruits, or masses Cardiac: RRR; no murmurs, rubs, or gallops,no edema  Respiratory:  clear to auscultation bilaterally, normal work of breathing GI: soft, nontender, nondistended, + BS MS: no deformity or atrophy  Skin: warm and dry Neuro:  Strength and sensation are intact Psych: euthymic mood, full affect  EKG:  EKG is ordered today. The ekg ordered today shows sinus rhythm, rate 58, anterior Q waves, lateral TWI  Recent Labs: 02/18/2016: ALT 50 02/20/2016: Magnesium 2.1 06/15/2016: BUN 16; Creat 0.94; Hemoglobin 13.9; Platelets 202; Potassium 5.1; Sodium 139    Lipid Panel  No results found for: CHOL, TRIG, HDL, CHOLHDL, VLDL, LDLCALC, LDLDIRECT   Wt  Readings from Last 3 Encounters:  09/20/16 144 lb 6.4 oz (65.5 kg)  09/09/16 146 lb 2.6 oz (66.3 kg)  06/17/16 147 lb 6.4 oz (66.9 kg)      Other studies Reviewed: Additional studies/ records that were reviewed today include:  TTE 05/20/16 - Left ventricle: The cavity size was mildly dilated. Wall   thickness was normal. Systolic function was severely reduced. The   estimated ejection fraction was in the range of 25% to 30%. There   is akinesis of the anteroseptal and apical myocardium. Features   are consistent with a pseudonormal left ventricular filling   pattern, with concomitant abnormal relaxation and increased   filling pressure (grade 2 diastolic dysfunction). - Aortic valve: There was trivial regurgitation. - Mitral valve: Calcified annulus. Mildly thickened leaflets .  There was mild regurgitation. - Left atrium: The atrium was moderately dilated.   ASSESSMENT AND PLAN:  1.  Ischemic cardiomyopathy: ICD placed 06/16/16. No atrial or ventricular arrhythmias noted.  No changes made today.  2. CAD: Currently without angina and feeling much improved post CABG  3. Hyperlipidemia: continue statin  Current medicines are reviewed at length with the patient today.   The patient does not have concerns regarding her medicines.  The following changes were made today:  none  Labs/ tests ordered today include:  Orders Placed This Encounter  Procedures  . EKG 12-Lead     Disposition:   FU with Zamaya Rapaport 9 months  Signed, Delwyn Scoggin Meredith Leeds, MD  09/20/2016 8:30 AM     [facility] [address] [place] [address] [phone] (office) [phone] (fax)

## 2016-09-20 ENCOUNTER — Encounter: Payer: Self-pay | Admitting: Cardiology

## 2016-09-20 ENCOUNTER — Ambulatory Visit (INDEPENDENT_AMBULATORY_CARE_PROVIDER_SITE_OTHER): Payer: Medicare Other | Admitting: Cardiology

## 2016-09-20 VITALS — BP 108/70 | HR 58 | Ht 66.5 in | Wt 144.4 lb

## 2016-09-20 DIAGNOSIS — I2589 Other forms of chronic ischemic heart disease: Secondary | ICD-10-CM | POA: Diagnosis not present

## 2016-09-20 DIAGNOSIS — I5022 Chronic systolic (congestive) heart failure: Secondary | ICD-10-CM | POA: Diagnosis not present

## 2016-09-20 DIAGNOSIS — Z95810 Presence of automatic (implantable) cardiac defibrillator: Secondary | ICD-10-CM

## 2016-09-20 DIAGNOSIS — I200 Unstable angina: Secondary | ICD-10-CM | POA: Diagnosis not present

## 2016-09-20 DIAGNOSIS — I255 Ischemic cardiomyopathy: Secondary | ICD-10-CM

## 2016-09-20 LAB — CUP PACEART INCLINIC DEVICE CHECK
Battery Remaining Longevity: 94 mo
Date Time Interrogation Session: 20171031100142
HighPow Impedance: 76.5 Ohm
Implantable Lead Implant Date: 20170727
Implantable Lead Location: 753859
Implantable Pulse Generator Implant Date: 20170727
Lead Channel Pacing Threshold Amplitude: 0.75 V
Lead Channel Pacing Threshold Amplitude: 0.75 V
Lead Channel Pacing Threshold Amplitude: 0.75 V
Lead Channel Pacing Threshold Pulse Width: 0.5 ms
Lead Channel Pacing Threshold Pulse Width: 0.5 ms
Lead Channel Pacing Threshold Pulse Width: 0.5 ms
Lead Channel Sensing Intrinsic Amplitude: 5 mV
Lead Channel Setting Pacing Amplitude: 2.5 V
Lead Channel Setting Pacing Pulse Width: 0.5 ms
MDC IDC LEAD IMPLANT DT: 20170727
MDC IDC LEAD LOCATION: 753860
MDC IDC MSMT LEADCHNL RA IMPEDANCE VALUE: 475 Ohm
MDC IDC MSMT LEADCHNL RA PACING THRESHOLD AMPLITUDE: 0.75 V
MDC IDC MSMT LEADCHNL RA PACING THRESHOLD PULSEWIDTH: 0.5 ms
MDC IDC MSMT LEADCHNL RV IMPEDANCE VALUE: 537.5 Ohm
MDC IDC MSMT LEADCHNL RV SENSING INTR AMPL: 12 mV
MDC IDC SET LEADCHNL RA PACING AMPLITUDE: 2 V
MDC IDC SET LEADCHNL RV SENSING SENSITIVITY: 0.5 mV
MDC IDC STAT BRADY RA PERCENT PACED: 6.9 %
MDC IDC STAT BRADY RV PERCENT PACED: 0 %
Pulse Gen Serial Number: 7261095

## 2016-09-20 NOTE — Patient Instructions (Addendum)
Medication Instructions:    Your physician recommends that you continue on your current medications as directed. Please refer to the Current Medication list given to you today.  --- If you need a refill on your cardiac medications before your next appointment, please call your pharmacy. ---  Labwork:  None ordered  Testing/Procedures:  None ordered  Follow-Up: Remote monitoring is used to monitor your Pacemaker of ICD from home. This monitoring reduces the number of office visits required to check your device to one time per year. It allows [place] to keep an eye on the functioning of your device to ensure it is working properly. You are scheduled for a device check from home on 12/20/2016. You may send your transmission at any time that day. If you have a wireless device, the transmission will be sent automatically. After your physician reviews your transmission, you will receive a postcard with your next transmission date.   Your physician wants you to follow-up in: 9 months with Dr. Curt Bears.  You will receive a reminder letter in the mail two months in advance. If you don't receive a letter, please call our office to schedule the follow-up appointment.  Thank you for choosing [facility]!!   Trinidad Curet, RN [phone]

## 2016-09-21 NOTE — Progress Notes (Addendum)
Cardiology Office Note   Date:  09/22/2016   ID:  Hailey Wright, DOB 1946-11-15, MRN [id number]  PCP:  Donnajean Lopes, MD  Cardiologist:   Minus Breeding, MD   Chief Complaint  Patient presents with  . Cardiomyopathy      History of Present Illness: Hailey Wright is a 70 y.o. female who presents for follow of CAD and ischemic cardiomyopathy.  She has a history of known coronary disease as previously described. She had a negative stress perfusion study in the fall of last year. However, she came back with symptoms of acute onset chest pain. The plan was an elective cardiac catheterization. However, prior to that presented with an acute anterior microinfarction. She had occlusion of the LAD with acute thrombus acute thrombus in the circumflex. She did have chronic occlusion of her right coronary artery. He was placed on intra-aortic balloon pump and had emergent CABG with a LIMA to the LAD and SVG to OM1. She is left with an ejection fraction of approximately 30%. Unfortunately she's not had room in her blood pressure for med titration.  In July she had an ICD placed. She returns for follow up.  She is doing well and recently [place] without any chest pain.  She denies any SOB/PND or orthopnea.    Of note she's not tolerated increased meds as her blood pressure has run low.   Past Medical History:  Diagnosis Date  . Acute ST elevation myocardial infarction (STEMI) involving left anterior descending coronary artery ([place]) 02/18/2016  . AICD (automatic cardioverter/defibrillator) present   . CAD (coronary artery disease) 06/04/2013  . 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 DVT (deep venous thrombosis) ([place]) 1980s   RLE  . Hepatitis A infection 1987  . Hyperlipidemia   . Ischemic cardiomyopathy    a. Echo 5/17: EF 25-30%, anteroseptal, anterior, anteroseptal and apical akinesis, possible inferior hypokinesis, grade 1 diastolic dysfunction, no evidence of  thrombus, trivial AI, mild MR, mild LAE  . MI (myocardial infarction) 1991   Inferior treated with TPA 1991, POBA RCA 1996  . Pneumonia 1980s X 1  . RAS (renal artery stenosis) ([facility])    Right renal stent 2005  . S/P Emergency CABG x 2 02/18/2016   LIMA to LAD, SVG to OM1, EVH via right thigh  . Type II diabetes mellitus ([place])     Past Surgical History:  Procedure Laterality Date  . CARDIAC CATHETERIZATION N/A 02/18/2016   Procedure: Left Heart Cath and Coronary Angiography;  Surgeon: Leonie Man, MD;  Location: [facility];  Service: Cardiovascular;  Laterality: N/A;  . CORONARY ANGIOPLASTY  1991  . CORONARY ARTERY BYPASS GRAFT N/A 02/18/2016   Procedure: CORONARY ARTERY BYPASS GRAFTING (CABG) times 2 using left internal mammary and right greater saphenous vein;  Surgeon: Rexene Alberts, MD;  Location: [facility];  Service: Open Heart Surgery;  Laterality: N/A;  . EP IMPLANTABLE DEVICE N/A 06/16/2016   Procedure: ICD Implant;  Surgeon: Will Meredith Leeds, MD;  Location: [facility];  Service: Cardiovascular;  Laterality: N/A;  . RENAL ARTERY STENT Right 2005  . RHINOPLASTY Bilateral 1968  . TONSILLECTOMY AND ADENOIDECTOMY  1950s     Current Outpatient Prescriptions  Medication Sig Dispense Refill  . aspirin 81 MG tablet Take 81 mg by mouth daily.    Marland Kitchen atorvastatin (LIPITOR) 80 MG tablet Take 80 mg by mouth daily.    . calcium-vitamin D (OSCAL WITH  D) 500-200 MG-UNIT per tablet Take 1 tablet by mouth 2 (two) times daily.    . clopidogrel (PLAVIX) 75 MG tablet Take 1 tablet (75 mg total) by mouth daily. 30 tablet 6  . Co-Enzyme Q10 200 MG CAPS Take 1 capsule by mouth daily.     Marland Kitchen lisinopril (PRINIVIL,ZESTRIL) 2.5 MG tablet Take 1 tablet (2.5 mg total) by mouth daily. 30 tablet 3  . Magnesium 500 MG TABS Take 1 tablet by mouth daily.     . metFORMIN (GLUCOPHAGE-XR) 500 MG 24 hr tablet Take 1 tablet by mouth every other day.     . metoprolol succinate (TOPROL XL) 25 MG 24  hr tablet Take 0.5 tablets (12.5 mg total) by mouth daily. Hold medication if your SBP is less than 100 (top number) 45 tablet 2  . Multiple Vitamin (MULTIVITAMIN) tablet Take 1 tablet by mouth daily.    Marland Kitchen NITROSTAT 0.4 MG SL tablet Place 1 tablet under the tongue every 5 (five) minutes x 3 doses as needed for chest pain.     No current facility-administered medications for this visit.     Allergies:   Review of patient's allergies indicates no known allergies.    ROS:  Please see the history of present illness.   Otherwise, review of systems are positive for none.   All other systems are reviewed and negative.    PHYSICAL EXAM: VS:  BP 114/70   Pulse 64   Ht 5\' 6"  (1.676 m)   Wt 143 lb (64.9 kg)   LMP  (LMP Unknown)   BMI 23.08 kg/m  , BMI Body mass index is 23.08 kg/m. GENERAL:  Well appearing HEENT:  Pupils equal round and reactive, fundi not visualized, oral mucosa unremarkable NECK:  No jugular venous distention, waveform within normal limits, carotid upstroke brisk and symmetric, no bruits, no thyromegaly LYMPHATICS:  No cervical, inguinal adenopathy LUNGS:  Clear to auscultation bilaterally BACK:  No CVA tenderness CHEST:  Well healed sternotomy scar, ICD pocket well healed.  Marland Kitchen HEART:  PMI not displaced or sustained,S1 and S2 within normal limits, no S3, no S4, no clicks, no rubs, no murmurs ABD:  Flat, positive bowel sounds normal in frequency in pitch, no bruits, no rebound, no guarding, no midline pulsatile mass, no hepatomegaly, no splenomegaly EXT:  2 plus pulses throughout, no edema, no cyanosis no clubbing SKIN:  No rashes no nodules    EKG:  EKG is not  ordered today.    Recent Labs: 02/18/2016: ALT 50 02/20/2016: Magnesium 2.1 06/15/2016: BUN 16; Creat 0.94; Hemoglobin 13.9; Platelets 202; Potassium 5.1; Sodium 139    Lipid Panel No results found for: CHOL, TRIG, HDL, CHOLHDL, VLDL, LDLCALC, LDLDIRECT    Wt Readings from Last 3 Encounters:  09/22/16 143  lb (64.9 kg)  09/20/16 144 lb 6.4 oz (65.5 kg)  09/09/16 146 lb 2.6 oz (66.3 kg)       Other studies Reviewed: Additional studies/ records that were reviewed today include: None Review of the above records demonstrates:      ASSESSMENT AND PLAN:   CAD -   The patient has no ongoing symptoms. She'll continue with risk reduction. She's about to start cardiac rehabilitation.  Ischemic CM -   hHr blood pressure might allow the change in her medications. She is going to stop her atenolol and start carvedilol 3.125 mg twice daily. If she tolerates this in several days following that she will start lisinopril 2.5 mg daily. She would then  have a follow-up basic metabolic profile.   I will refer her to EP for consideration of an ICD.  We had a log discussion about this.   HL -   This is followed by Donnajean Lopes, MD.  The goal should be an LDL less than 70.  For now I will not change her meds.   ICD - she is up to date with follow up.     Current medicines are reviewed at length with the patient today.  The patient does not have concerns regarding medicines.  The following changes have been made:  None  Labs/ tests ordered today include:  None  No orders of the defined types were placed in this encounter.    Disposition:   FU with me in six months.     Signed, Minus Breeding, MD  09/22/2016 12:58 PM    [facility]

## 2016-09-22 ENCOUNTER — Ambulatory Visit (INDEPENDENT_AMBULATORY_CARE_PROVIDER_SITE_OTHER): Payer: Medicare Other | Admitting: Cardiology

## 2016-09-22 ENCOUNTER — Encounter: Payer: Self-pay | Admitting: Cardiology

## 2016-09-22 VITALS — BP 114/70 | HR 64 | Ht 66.0 in | Wt 143.0 lb

## 2016-09-22 DIAGNOSIS — I2589 Other forms of chronic ischemic heart disease: Secondary | ICD-10-CM

## 2016-09-22 DIAGNOSIS — I255 Ischemic cardiomyopathy: Secondary | ICD-10-CM | POA: Diagnosis not present

## 2016-09-22 DIAGNOSIS — R3121 Asymptomatic microscopic hematuria: Secondary | ICD-10-CM | POA: Diagnosis not present

## 2016-09-22 DIAGNOSIS — I200 Unstable angina: Secondary | ICD-10-CM | POA: Diagnosis not present

## 2016-09-22 IMAGING — NM NM MISC PROCEDURE
6 series · 36 of 36 positions shown · non-contrast
Comparison: none

[Series 1: wbr_r-proj_st wbr rest · 6.40mm/px · 6 of 64 frames shown]
[frame 6/64]
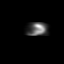
[frame 16/64]
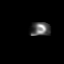
[frame 27/64]
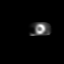
[frame 38/64]
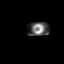
[frame 48/64]
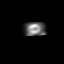
[frame 59/64]
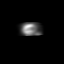

[Series 1: wbr rest · 6.40mm/px · 6 of 64 frames shown]
[frame 6/64]
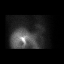
[frame 16/64]
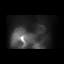
[frame 27/64]
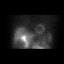
[frame 38/64]
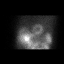
[frame 48/64]
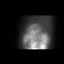
[frame 59/64]
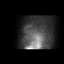

[Series 2: wbr stress-gsp · 6.40mm/px · 6 of 512 frames shown]
[frame 43/512]
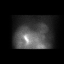
[frame 128/512]
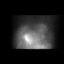
[frame 214/512]
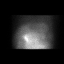
[frame 299/512]
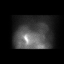
[frame 384/512]
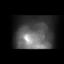
[frame 470/512]
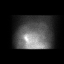

[Series 2: wbr_s-proj_st wbr stress-gsp · 6.40mm/px · 6 of 512 frames shown]
[frame 43/512]
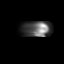
[frame 128/512]
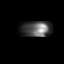
[frame 214/512]
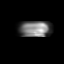
[frame 299/512]
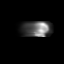
[frame 384/512]
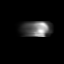
[frame 470/512]
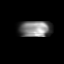

[Series 3: wbr_s-proj_st wbr stress-sum-em · 6.40mm/px · 6 of 64 frames shown]
[frame 6/64]
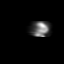
[frame 16/64]
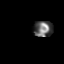
[frame 27/64]
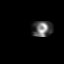
[frame 38/64]
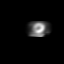
[frame 48/64]
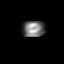
[frame 59/64]
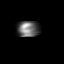

[Series 3: wbr stress-sum-em · 6.40mm/px · 6 of 64 frames shown]
[frame 6/64]
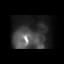
[frame 16/64]
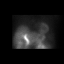
[frame 27/64]
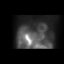
[frame 38/64]
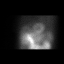
[frame 48/64]
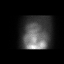
[frame 59/64]
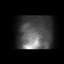

[36 of 36 positions shown; findings below may reference images not displayed]

Canned report from images found in remote index.

Refer to host system for actual result text.

## 2016-09-22 NOTE — Patient Instructions (Signed)
Your physician wants you to follow-up in: 6 Months You will receive a reminder letter in the mail two months in advance. If you don't receive a letter, please call our office to schedule the follow-up appointment.  

## 2016-09-23 ENCOUNTER — Telehealth: Payer: Self-pay

## 2016-09-23 NOTE — Telephone Encounter (Signed)
Referred to [facility] clinic by Dr Curt Bears.  Attempted call to patient and no answer, no message left.

## 2016-09-26 DIAGNOSIS — R319 Hematuria, unspecified: Secondary | ICD-10-CM | POA: Diagnosis not present

## 2016-09-26 DIAGNOSIS — Z1231 Encounter for screening mammogram for malignant neoplasm of breast: Secondary | ICD-10-CM | POA: Diagnosis not present

## 2016-09-26 DIAGNOSIS — R3121 Asymptomatic microscopic hematuria: Secondary | ICD-10-CM | POA: Diagnosis not present

## 2016-10-05 DIAGNOSIS — R3121 Asymptomatic microscopic hematuria: Secondary | ICD-10-CM | POA: Diagnosis not present

## 2016-10-20 NOTE — Telephone Encounter (Signed)
Patient left voice mail and attempted return call.  No answer and left voice mail message with ICM number.

## 2016-10-21 NOTE — Telephone Encounter (Signed)
Spoke with patient and ICM intro given.  She agreed to monthly ICM follow up.  She reported she is doing well at this time.  She weighs daily at home.  Provided ICM direct number and encouraged to call for fluid symptoms.

## 2016-10-24 DIAGNOSIS — R7309 Other abnormal glucose: Secondary | ICD-10-CM | POA: Diagnosis not present

## 2016-10-24 DIAGNOSIS — I10 Essential (primary) hypertension: Secondary | ICD-10-CM | POA: Diagnosis not present

## 2016-10-24 DIAGNOSIS — E784 Other hyperlipidemia: Secondary | ICD-10-CM | POA: Diagnosis not present

## 2016-10-24 DIAGNOSIS — Z0000 Encounter for general adult medical examination without abnormal findings: Secondary | ICD-10-CM | POA: Diagnosis not present

## 2016-10-25 DIAGNOSIS — R7309 Other abnormal glucose: Secondary | ICD-10-CM | POA: Diagnosis not present

## 2016-10-31 DIAGNOSIS — R7309 Other abnormal glucose: Secondary | ICD-10-CM | POA: Diagnosis not present

## 2016-10-31 DIAGNOSIS — Z87891 Personal history of nicotine dependence: Secondary | ICD-10-CM | POA: Diagnosis not present

## 2016-10-31 DIAGNOSIS — Z6823 Body mass index (BMI) 23.0-23.9, adult: Secondary | ICD-10-CM | POA: Diagnosis not present

## 2016-10-31 DIAGNOSIS — R05 Cough: Secondary | ICD-10-CM | POA: Diagnosis not present

## 2016-10-31 DIAGNOSIS — E784 Other hyperlipidemia: Secondary | ICD-10-CM | POA: Diagnosis not present

## 2016-10-31 DIAGNOSIS — Z1389 Encounter for screening for other disorder: Secondary | ICD-10-CM | POA: Diagnosis not present

## 2016-10-31 DIAGNOSIS — I10 Essential (primary) hypertension: Secondary | ICD-10-CM | POA: Diagnosis not present

## 2016-10-31 DIAGNOSIS — Z9861 Coronary angioplasty status: Secondary | ICD-10-CM | POA: Diagnosis not present

## 2016-10-31 DIAGNOSIS — Z0000 Encounter for general adult medical examination without abnormal findings: Secondary | ICD-10-CM | POA: Diagnosis not present

## 2016-10-31 DIAGNOSIS — J438 Other emphysema: Secondary | ICD-10-CM | POA: Diagnosis not present

## 2016-11-04 ENCOUNTER — Ambulatory Visit (INDEPENDENT_AMBULATORY_CARE_PROVIDER_SITE_OTHER): Payer: Medicare Other

## 2016-11-04 DIAGNOSIS — Z95810 Presence of automatic (implantable) cardiac defibrillator: Secondary | ICD-10-CM | POA: Diagnosis not present

## 2016-11-04 DIAGNOSIS — I5022 Chronic systolic (congestive) heart failure: Secondary | ICD-10-CM

## 2016-11-04 NOTE — Progress Notes (Signed)
EPIC Encounter for ICM Monitoring  Patient Name: Hailey Wright is a 70 y.o. female Date: 11/04/2016 Primary Care Physican: Donnajean Lopes, MD Primary Cardiologist: Hochrein Electrophysiologist: Camnitz Dry Weight: 140 lbs         Heart Failure questions reviewed, pt asymptomatic   Thoracic impedance normal.  Recommendations: No changes.  Reinforced to limit low salt food choices to 2000 mg day and limiting fluid intake to < 2 liters per day. Encouraged to call for fluid symptoms.    Follow-up plan: ICM clinic phone appointment on 12/21/2015.  Copy of ICM check sent to device physician.   ICM trend: 11/04/2016       [name], RN 11/04/2016 3:13 PM

## 2016-11-06 ENCOUNTER — Encounter: Payer: Self-pay | Admitting: Cardiology

## 2016-11-08 DIAGNOSIS — Z1212 Encounter for screening for malignant neoplasm of rectum: Secondary | ICD-10-CM | POA: Diagnosis not present

## 2016-11-11 ENCOUNTER — Ambulatory Visit
Admission: RE | Admit: 2016-11-11 | Discharge: 2016-11-11 | Disposition: A | Discharge status: Home / Self Care | Payer: Medicare Other | Source: Ambulatory Visit | Attending: Acute Care | Admitting: Acute Care

## 2016-11-11 ENCOUNTER — Telehealth: Payer: Self-pay | Admitting: Acute Care

## 2016-11-11 DIAGNOSIS — Z87891 Personal history of nicotine dependence: Secondary | ICD-10-CM

## 2016-11-11 NOTE — Telephone Encounter (Signed)
I called to give Hailey Wright the results of her low-dose screening CT. There was no answer. I left a message requesting that she call the office for results. We will await return call.

## 2016-12-03 ENCOUNTER — Other Ambulatory Visit: Payer: Self-pay | Admitting: Cardiology

## 2016-12-20 ENCOUNTER — Ambulatory Visit (INDEPENDENT_AMBULATORY_CARE_PROVIDER_SITE_OTHER): Payer: Medicare Other | Admitting: *Deleted

## 2016-12-20 DIAGNOSIS — Z95810 Presence of automatic (implantable) cardiac defibrillator: Secondary | ICD-10-CM

## 2016-12-20 DIAGNOSIS — I255 Ischemic cardiomyopathy: Secondary | ICD-10-CM | POA: Diagnosis not present

## 2016-12-20 DIAGNOSIS — I5022 Chronic systolic (congestive) heart failure: Secondary | ICD-10-CM | POA: Diagnosis not present

## 2016-12-20 NOTE — Progress Notes (Signed)
EPIC Encounter for ICM Monitoring  Patient Name: Hailey Wright is a 71 y.o. female Date: 12/20/2016 Primary Care Physican: Donnajean Lopes, MD Primary Cardiologist: Hochrein Electrophysiologist: Curt Bears Dry Weight: 145 lbs       Heart Failure questions reviewed, pt asymptomatic   Thoracic impedance normal but was abnormal suggesting fluid accumulation from 1/12 to 1/20 and 1/25 to 1/28.  Recommendations: No changes. Reminded to limit dietary salt intake to 2000 mg/day and fluid intake to < 2 liters/day. Encouraged to call for fluid symptoms.  Follow-up plan: ICM clinic phone appointment on 01/20/2017.  Copy of ICM check sent to device physician.   3 month ICM trend: 12/20/2016   1 Year ICM trend:      Hailey Billings, RN 12/20/2016 11:15 AM

## 2016-12-20 NOTE — Progress Notes (Signed)
Remote ICD transmission.   

## 2016-12-21 ENCOUNTER — Encounter: Payer: Self-pay | Admitting: Cardiology

## 2016-12-29 LAB — CUP PACEART REMOTE DEVICE CHECK
Battery Remaining Longevity: 85 mo
Battery Remaining Percentage: 87 %
Brady Statistic AP VP Percent: 1 %
Brady Statistic AP VS Percent: 19 %
Brady Statistic AS VS Percent: 72 %
Date Time Interrogation Session: 20180130070019
HIGH POWER IMPEDANCE MEASURED VALUE: 74 Ohm
HighPow Impedance: 74 Ohm
Implantable Lead Implant Date: 20170727
Implantable Lead Location: 753860
Implantable Pulse Generator Implant Date: 20170727
Lead Channel Impedance Value: 460 Ohm
Lead Channel Pacing Threshold Amplitude: 0.75 V
Lead Channel Pacing Threshold Pulse Width: 0.5 ms
Lead Channel Sensing Intrinsic Amplitude: 5 mV
Lead Channel Setting Sensing Sensitivity: 0.5 mV
MDC IDC LEAD IMPLANT DT: 20170727
MDC IDC LEAD LOCATION: 753859
MDC IDC MSMT BATTERY VOLTAGE: 3.04 V
MDC IDC MSMT LEADCHNL RA IMPEDANCE VALUE: 460 Ohm
MDC IDC MSMT LEADCHNL RV PACING THRESHOLD AMPLITUDE: 0.75 V
MDC IDC MSMT LEADCHNL RV PACING THRESHOLD PULSEWIDTH: 0.5 ms
MDC IDC MSMT LEADCHNL RV SENSING INTR AMPL: 12 mV
MDC IDC PG SERIAL: 7261095
MDC IDC SET LEADCHNL RA PACING AMPLITUDE: 2 V
MDC IDC SET LEADCHNL RV PACING AMPLITUDE: 2.5 V
MDC IDC SET LEADCHNL RV PACING PULSEWIDTH: 0.5 ms
MDC IDC STAT BRADY AS VP PERCENT: 1 %
MDC IDC STAT BRADY RA PERCENT PACED: 5.6 %
MDC IDC STAT BRADY RV PERCENT PACED: 1 %

## 2017-01-04 ENCOUNTER — Encounter: Payer: Self-pay | Admitting: Cardiology

## 2017-01-20 ENCOUNTER — Ambulatory Visit (INDEPENDENT_AMBULATORY_CARE_PROVIDER_SITE_OTHER): Payer: Medicare Other

## 2017-01-20 DIAGNOSIS — I5022 Chronic systolic (congestive) heart failure: Secondary | ICD-10-CM | POA: Diagnosis not present

## 2017-01-20 DIAGNOSIS — Z95810 Presence of automatic (implantable) cardiac defibrillator: Secondary | ICD-10-CM | POA: Diagnosis not present

## 2017-01-20 NOTE — Progress Notes (Signed)
EPIC Encounter for ICM Monitoring  Patient Name: Hailey Wright is a 71 y.o. female Date: 01/20/2017 Primary Care Physican: Donnajean Lopes, MD Primary Cardiologist: Hochrein Electrophysiologist: Camnitz Dry Weight:145lbs        Heart Failure questions reviewed, pt asymptomatic.   Thoracic impedance normal but was abnormal suggesting fluid accumulation from 01/04/2017 to 01/06/2017 and 01/09/2017 to 01/14/2017.  Not prescribed diuretic  Recommendations: No changes. Reminded to limit dietary salt intake to 2000 mg/day and fluid intake to < 2 liters/day. Encouraged to call for fluid symptoms.  Follow-up plan: ICM clinic phone appointment on 02/20/2017.  Copy of ICM check sent to device physician.   3 month ICM trend: 01/20/2017   1 Year ICM trend:      [name], RN 01/20/2017 8:42 AM

## 2017-02-20 ENCOUNTER — Ambulatory Visit (INDEPENDENT_AMBULATORY_CARE_PROVIDER_SITE_OTHER): Payer: Medicare Other

## 2017-02-20 DIAGNOSIS — Z95810 Presence of automatic (implantable) cardiac defibrillator: Secondary | ICD-10-CM | POA: Diagnosis not present

## 2017-02-20 DIAGNOSIS — I5022 Chronic systolic (congestive) heart failure: Secondary | ICD-10-CM | POA: Diagnosis not present

## 2017-02-20 NOTE — Progress Notes (Signed)
EPIC Encounter for ICM Monitoring  Patient Name: Hailey Wright is a 71 y.o. female Date: 02/20/2017 Primary Care Physican: Donnajean Lopes, MD Primary Cardiologist: Hochrein Electrophysiologist: Camnitz Dry Weight:145lbs           Heart Failure questions reviewed, pt asymptomatic.   Thoracic impedance normal.  Not prescribed diuretic  Recommendations: No changes.She will be out of the country [place] 2 weeks. Encouraged to call for fluid symptoms.  Follow-up plan: ICM clinic phone appointment on 03/27/2017.  Office appointment scheduled on 03/29/2017 with Dr Percival Spanish.  Copy of ICM check sent to physician.   3 month ICM trend: 02/20/2017   1 Year ICM trend:      [name], RN 02/20/2017 3:47 PM

## 2017-02-25 IMAGING — CR DG CHEST 1V PORT
1 series · 1 of 1 positions shown · non-contrast
Comparison: Chest radiograph performed 02/18/2016

CLINICAL DATA: Intra-aortic balloon pump placement. Initial
encounter.

EXAM:
PORTABLE CHEST 1 VIEW

[AP]
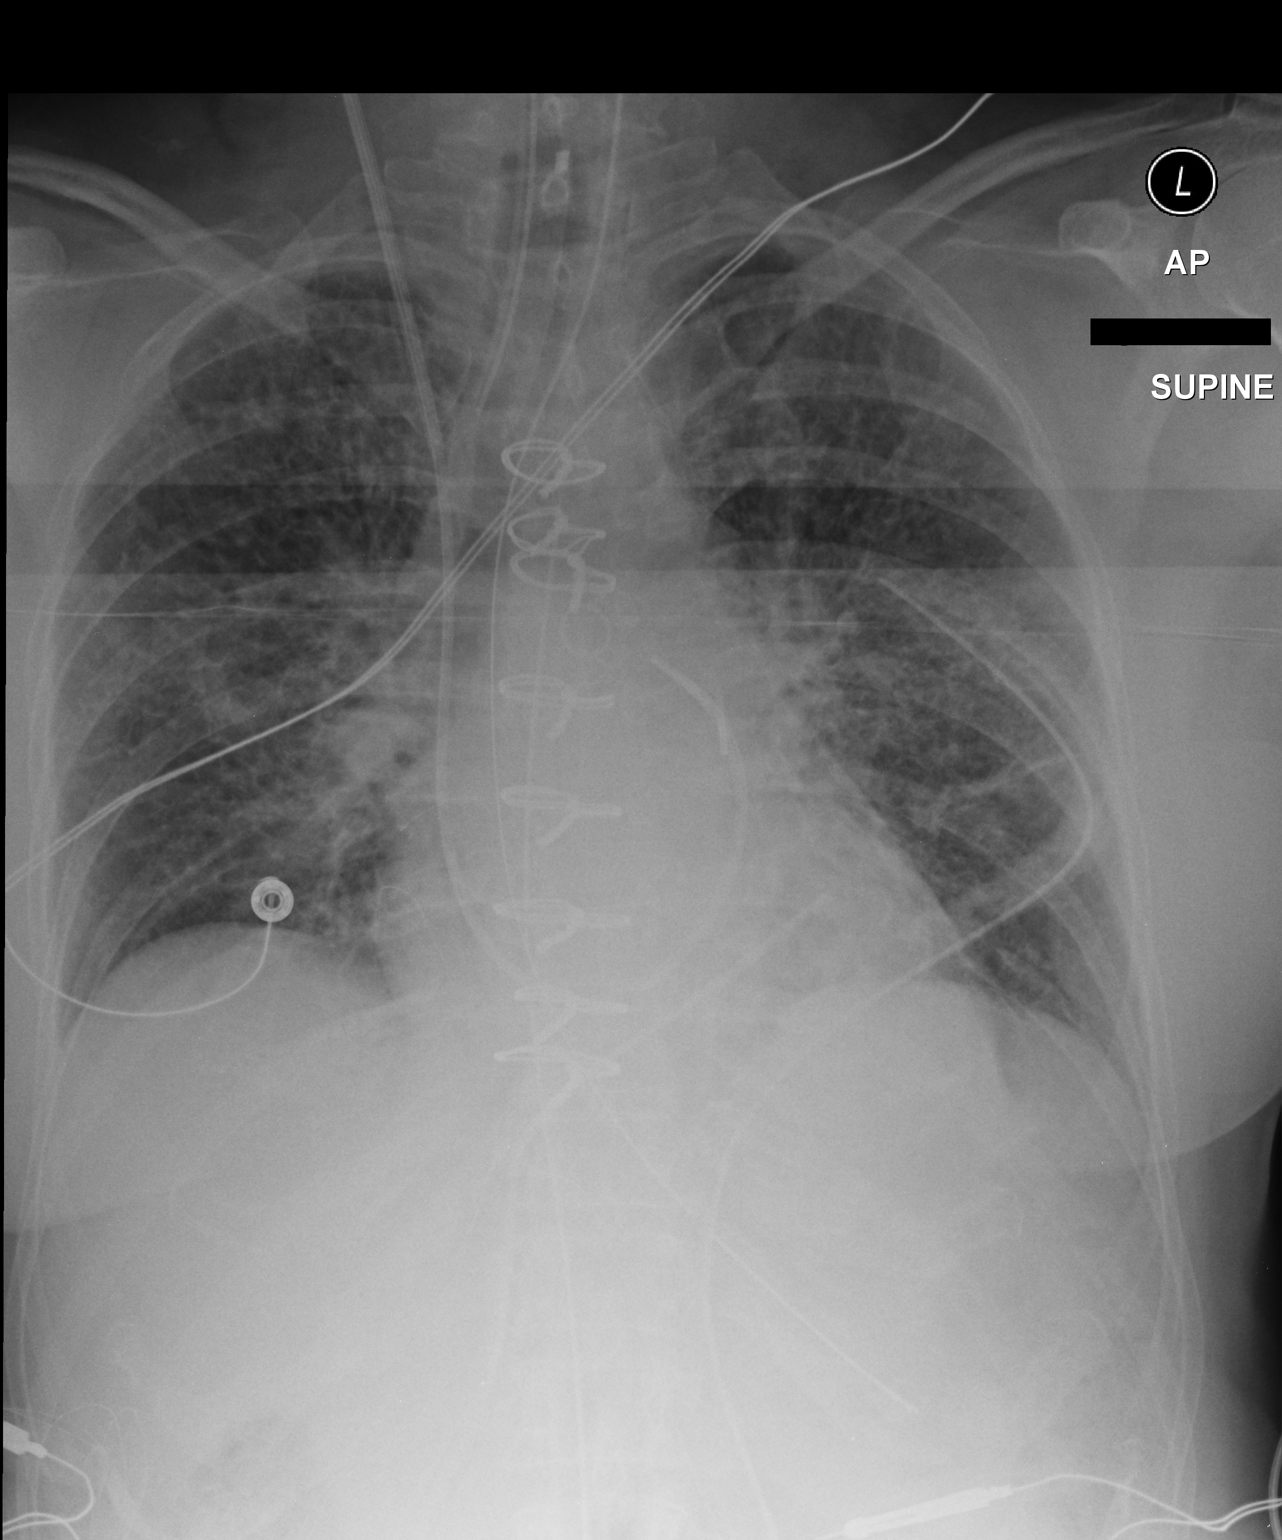

[1 of 1 positions shown; findings below may reference images not displayed]

FINDINGS: The patient's intra-aortic balloon pump tip is noted 2-3 cm below
the aortic knob.

The patient's endotracheal tube is seen ending 4 cm above the
carina. An enteric tube is noted ending overlying the body of the
stomach, with the side port about the gastroesophageal junction.
Mediastinal drains and a left-sided chest tube are noted. A right IJ
Swan-Ganz catheter is noted ending at the pulmonary outflow tract.

Vascular congestion is noted, with increased interstitial markings,
concerning for mild interstitial edema. No pleural effusion or
pneumothorax is seen.

The cardiomediastinal silhouette is borderline normal in size. The
patient is status post median sternotomy, with evidence of prior
CABG. No acute osseous abnormalities are seen.
IMPRESSION: 1. Intra-aortic balloon pump tip noted 2-3 cm below the aortic knob.
This could be advanced 2-3 cm.
2. Endotracheal tube seen ending 4 cm above the carina.
3. Vascular congestion, with increased interstitial markings,
concerning for mild pulmonary edema.

These results were called by telephone at the time of interpretation
on 02/19/2016 at [DATE] to Kenda Chef on [id number], who verbally
acknowledged these results.

## 2017-02-25 IMAGING — CR DG CHEST 1V PORT
1 series · 1 of 1 positions shown · non-contrast
Comparison: Portable film earlier in the day.

CLINICAL DATA: Status post CABG.

EXAM:
PORTABLE CHEST 1 VIEW

[AP]
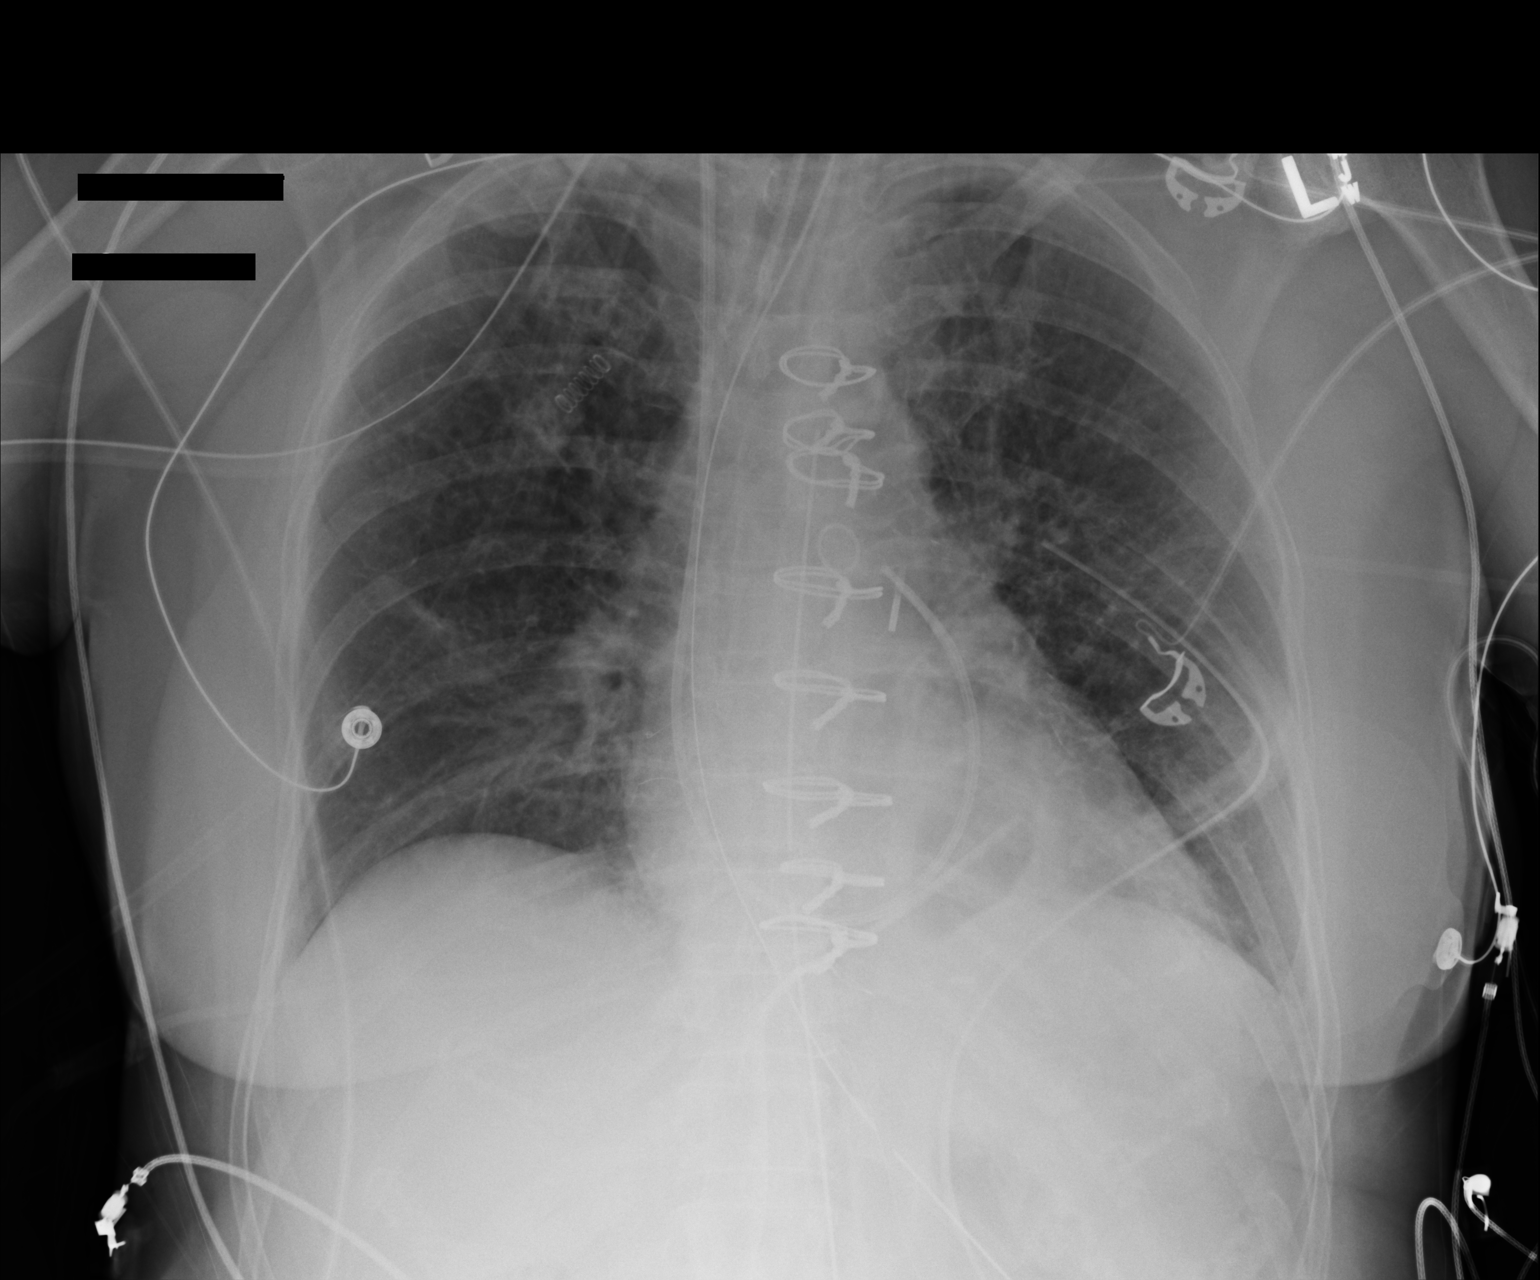

[1 of 1 positions shown; findings below may reference images not displayed]

FINDINGS: Stable cardiomediastinal silhouette. Mild vascular congestion is
improved. Swan-Ganz catheter tip main pulmonary artery. ET tube
stable 4 cm above carina. Mild LEFT basilar subsegmental
atelectasis. Trace LEFT effusion. LEFT chest tube. Mediastinal tube.
Orogastric tube. No pneumothorax.
IMPRESSION: Improved aeration.

## 2017-02-27 IMAGING — CR DG CHEST 1V PORT
1 series · 1 of 1 positions shown · non-contrast
Comparison: 02/19/2016 and prior exams

CLINICAL DATA: Status post CABG.

EXAM:
PORTABLE CHEST 1 VIEW

[AP]
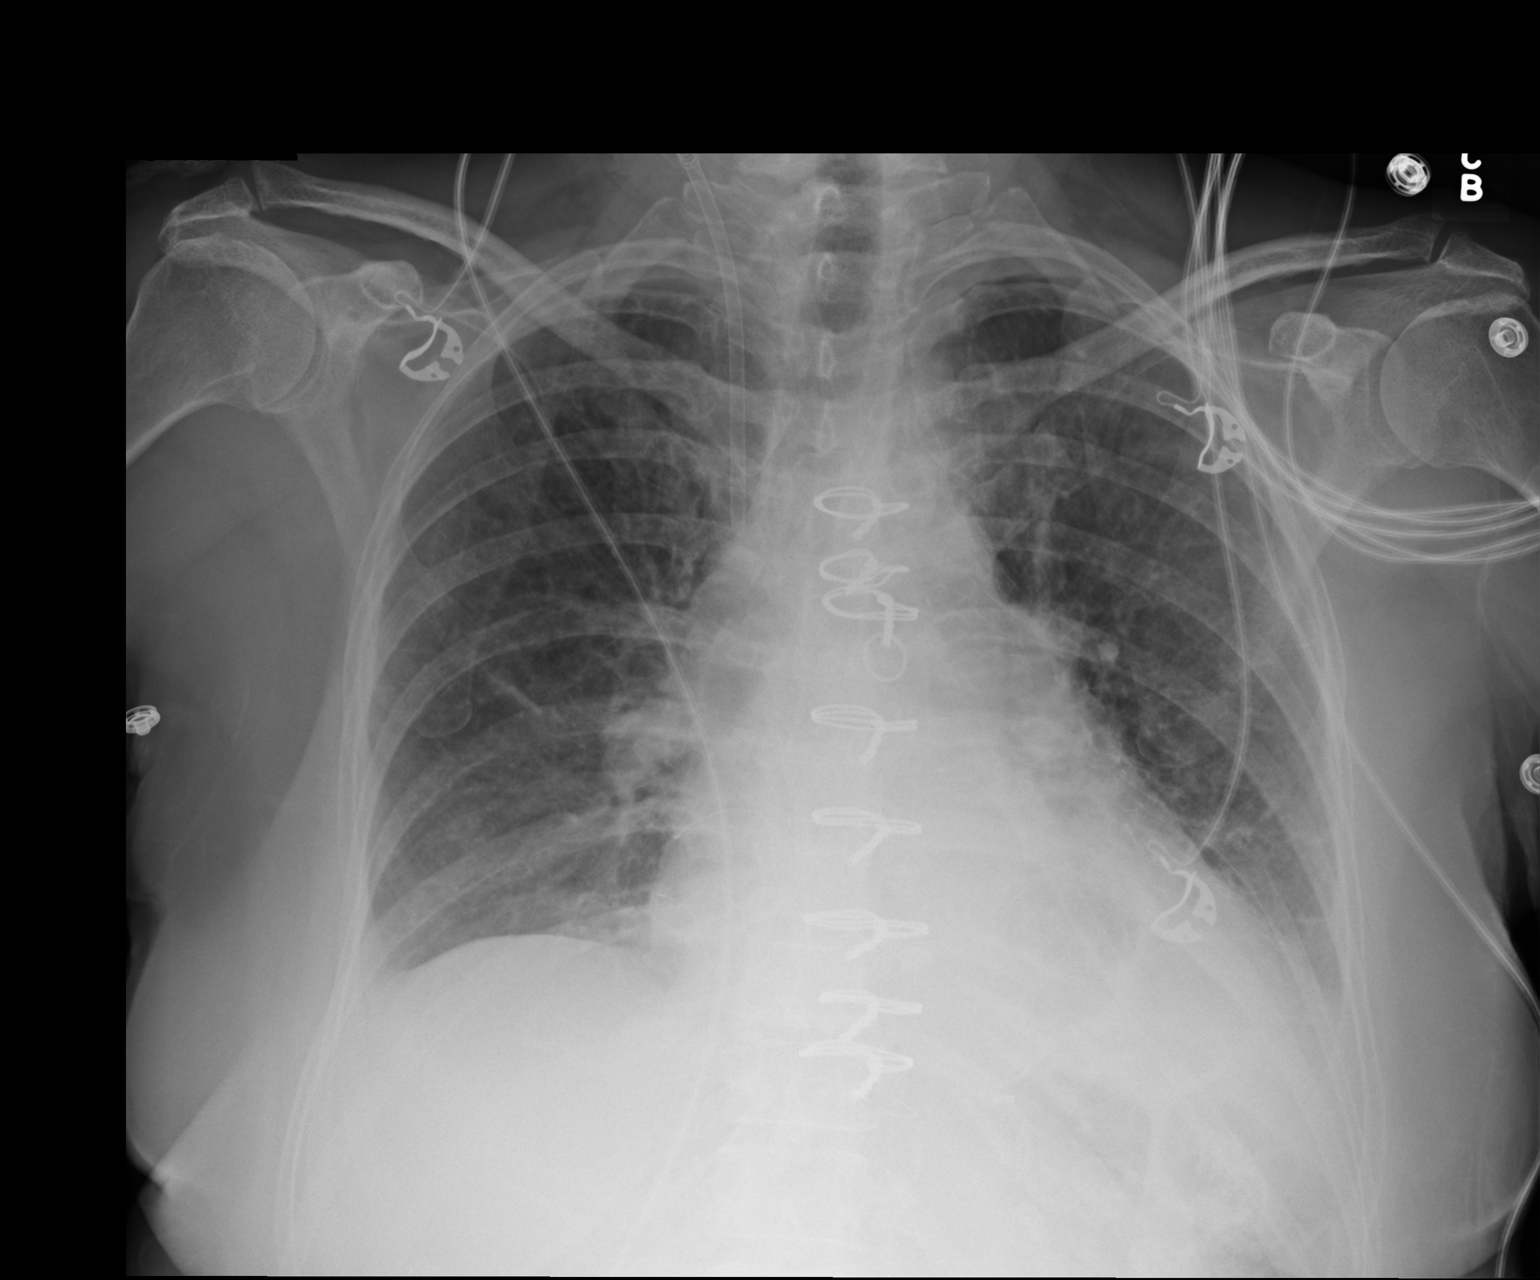

[1 of 1 positions shown; findings below may reference images not displayed]

FINDINGS: Cardiomegaly and CABG changes identified.

Right IJ Swan-Ganz catheter has been removed with sheath remaining.

There has been interval removal of the endotracheal, NG, mediastinal
and left thoracostomy tubes.

There is no evidence of pneumothorax or pulmonary edema.

Left basilar atelectasis is noted with trace effusions.
IMPRESSION: Support apparatus removal- no evidence of pneumothorax.

Continued left basilar atelectasis and trace effusions.

## 2017-03-20 ENCOUNTER — Ambulatory Visit: Payer: Medicare Other | Admitting: Thoracic Surgery (Cardiothoracic Vascular Surgery)

## 2017-03-27 ENCOUNTER — Ambulatory Visit (INDEPENDENT_AMBULATORY_CARE_PROVIDER_SITE_OTHER): Payer: Medicare Other | Admitting: Thoracic Surgery (Cardiothoracic Vascular Surgery)

## 2017-03-27 ENCOUNTER — Ambulatory Visit: Payer: Medicare Other | Admitting: Thoracic Surgery (Cardiothoracic Vascular Surgery)

## 2017-03-27 ENCOUNTER — Ambulatory Visit (INDEPENDENT_AMBULATORY_CARE_PROVIDER_SITE_OTHER): Payer: Medicare Other | Admitting: *Deleted

## 2017-03-27 ENCOUNTER — Encounter: Payer: Self-pay | Admitting: Thoracic Surgery (Cardiothoracic Vascular Surgery)

## 2017-03-27 VITALS — BP 114/68 | HR 59 | Resp 16 | Ht 66.0 in | Wt 157.0 lb

## 2017-03-27 DIAGNOSIS — I5022 Chronic systolic (congestive) heart failure: Secondary | ICD-10-CM | POA: Diagnosis not present

## 2017-03-27 DIAGNOSIS — Z95810 Presence of automatic (implantable) cardiac defibrillator: Secondary | ICD-10-CM | POA: Diagnosis not present

## 2017-03-27 DIAGNOSIS — I255 Ischemic cardiomyopathy: Secondary | ICD-10-CM

## 2017-03-27 DIAGNOSIS — Z951 Presence of aortocoronary bypass graft: Secondary | ICD-10-CM

## 2017-03-27 NOTE — Progress Notes (Signed)
EPIC Encounter for ICM Monitoring  Patient Name: Hailey Wright is a 71 y.o. female Date: 03/27/2017 Primary Care Physican: Leanna Battles, MD Primary Cardiologist: Hochrein Electrophysiologist: Curt Bears Dry Weight:145lbs       Heart Failure questions reviewed, pt asymptomatic.   Thoracic impedance normal but was abnormal suggesting fluid accumulation from 03/14/2017 to 03/20/2017.  She was traveling in [place] from 03/12/17 and to 03/23/2017  No diuretic  Recommendations: No changes. Discussed to limit salt intake to 2000 mg/day and fluid intake to < 2 liters/day.  Encouraged to call for fluid symptoms or use local ER for any urgent symptoms.  Follow-up plan: ICM clinic phone appointment on 04/27/2017.  Office appointment scheduled on 03/29/2017 with Dr Percival Spanish.  Copy of ICM check sent to primary cardiologist and device physician.   3 month ICM trend: 03/27/2017        [name], RN 03/27/2017 1:56 PM

## 2017-03-27 NOTE — Progress Notes (Signed)
Cardiology Office Note   Date:  03/30/2017   ID:  Hailey Wright, DOB [birth year], MRN [id number]  PCP:  Hailey Battles, MD  Cardiologist:   Hailey Breeding, MD   Chief Complaint  Patient presents with  . Cardiomyopathy     History of Present Illness: Hailey Wright is a 71 y.o. female who presents for follow of CAD and ischemic cardiomyopathy.  She has a history of known coronary disease as previously described. She had a negative stress perfusion study in the fall of 2016.  However, she came back with symptoms of acute onset chest pain. The plan was an elective cardiac catheterization. However, prior to that presented with an acute anterior myocardial infarction. She had occlusion of the LAD with acute thrombus in the circumflex. She did have chronic occlusion of her right coronary artery. She was placed on intra-aortic balloon pump and had emergent CABG with a LIMA to the LAD and SVG to OM1. She is left with an ejection fraction of approximately 30%. In July of last year she had an ICD placed.    Since I last saw her she has done well.  She saw Dr.Owen recently.  She has had remote follow up of her ICD.  She recently traveled to [place] and was able to do significant walking without any symptoms.  The patient denies any new symptoms such as chest discomfort, neck or arm discomfort. There has been no new shortness of breath, PND or orthopnea. There have been no reported palpitations, presyncope or syncope.  She did have a cough on ACE inhibitor and is now on Cozaar.  She has not tolerated beta blocker titration.  In fact she has to hold her beta blocker at times because her BP is less than 100.  She might get light headed.  The patient denies any new symptoms such as chest discomfort, neck or arm discomfort. There has been no new shortness of breath, PND or orthopnea. There have been no reported palpitations, presyncope or syncope.   Past Medical History:  Diagnosis Date  . Acute ST elevation  myocardial infarction (STEMI) involving left anterior descending coronary artery ([facility]) 02/18/2016  . AICD (automatic cardioverter/defibrillator) present   . CAD (coronary artery disease) 06/04/2013  . Coronary artery thrombosis ([facility]) 02/18/2016  . DVT (deep venous thrombosis) ([facility]) 1980s   RLE  . Hepatitis A infection 1987  . Hyperlipidemia   . Ischemic cardiomyopathy    a. Echo 5/17: EF 25-30%, anteroseptal, anterior, anteroseptal and apical akinesis, possible inferior hypokinesis, grade 1 diastolic dysfunction, no evidence of thrombus, trivial AI, mild MR, mild LAE  . MI (myocardial infarction) ([facility]) 1991   Inferior treated with TPA 1991, POBA RCA 1996  . Pneumonia 1980s X 1  . RAS (renal artery stenosis) ([facility])    Right renal stent 2005  . S/P Emergency CABG x 2 02/18/2016   LIMA to LAD, SVG to OM1, EVH via right thigh  . Type II diabetes mellitus ([facility])     Past Surgical History:  Procedure Laterality Date  . CARDIAC CATHETERIZATION N/A 02/18/2016   Procedure: Left Heart Cath and Coronary Angiography;  Surgeon: Leonie Man, MD;  Location: [facility];  Service: Cardiovascular;  Laterality: N/A;  . CORONARY ANGIOPLASTY  1991  . CORONARY ARTERY BYPASS GRAFT N/A 02/18/2016   Procedure: CORONARY ARTERY BYPASS GRAFTING (CABG) times 2 using left internal mammary and right greater saphenous vein;  Surgeon: Rexene Alberts, MD;  Location: [facility];  Service:  Open Heart Surgery;  Laterality: N/A;  . EP IMPLANTABLE DEVICE N/A 06/16/2016   Procedure: ICD Implant;  Surgeon: Will Meredith Leeds, MD;  Location: [facility];  Service: Cardiovascular;  Laterality: N/A;  . RENAL ARTERY STENT Right 2005  . RHINOPLASTY Bilateral 1968  . TONSILLECTOMY AND ADENOIDECTOMY  1950s     Current Outpatient Prescriptions  Medication Sig Dispense Refill  . aspirin 81 MG tablet Take 81 mg by mouth daily.    Marland Kitchen atorvastatin (LIPITOR) 80 MG tablet Take 80 mg by mouth daily.    . calcium-vitamin D  (OSCAL WITH D) 500-200 MG-UNIT per tablet Take 1 tablet by mouth 2 (two) times daily.    Marland Kitchen Co-Enzyme Q10 200 MG CAPS Take 1 capsule by mouth daily.     Marland Kitchen ezetimibe (ZETIA) 10 MG tablet Take 10 mg by mouth daily.    Marland Kitchen losartan (COZAAR) 25 MG tablet Take 25 mg by mouth daily.    . Magnesium 500 MG TABS Take 1 tablet by mouth daily.     . metoprolol succinate (TOPROL XL) 25 MG 24 hr tablet Take 0.5 tablets (12.5 mg total) by mouth daily. Hold medication if your SBP is less than 100 (top number) 45 tablet 2  . Multiple Vitamin (MULTIVITAMIN) tablet Take 1 tablet by mouth daily.    Marland Kitchen NITROSTAT 0.4 MG SL tablet Place 1 tablet under the tongue every 5 (five) minutes x 3 doses as needed for chest pain.     No current facility-administered medications for this visit.     Allergies:   Patient has no known allergies.    ROS:  Please see the history of present illness.   Otherwise, review of systems are positive for none.   All other systems are reviewed and negative.    PHYSICAL EXAM: VS:  BP 116/72   Pulse 71   Ht 5\' 7"  (1.702 m)   Wt 158 lb (71.7 kg)   LMP  (LMP Unknown)   BMI 24.75 kg/m  , BMI Body mass index is 24.75 kg/m. GENERAL:  Well appearing NECK:  No jugular venous distention, waveform within normal limits, carotid upstroke brisk and symmetric, no bruits, no thyromegaly LUNGS:  Clear to auscultation bilaterally CHEST:  Well healed ICD pocket HEART:  PMI not displaced or sustained,S1 and S2 within normal limits, no S3, no S4, no clicks, no rubs, soft brief left lower sternal border murmur, no diastolic murmurs ABD:  Flat, positive bowel sounds normal in frequency in pitch, no bruits, no rebound, no guarding, no midline pulsatile mass, no hepatomegaly, no splenomegaly EXT:  2 plus pulses throughout, no edema, no cyanosis no clubbing   EKG:  EKG is not  ordered today.   Recent Labs: 06/15/2016: BUN 16; Creat 0.94; Hemoglobin 13.9; Platelets 202; Potassium 5.1; Sodium 139     Lipid Panel No results found for: CHOL, TRIG, HDL, CHOLHDL, VLDL, LDLCALC, LDLDIRECT    Wt Readings from Last 3 Encounters:  03/29/17 158 lb (71.7 kg)  03/27/17 157 lb (71.2 kg)  09/22/16 143 lb (64.9 kg)       Other studies Reviewed: Additional studies/ records that were reviewed today include: Labs Review of the above records demonstrates:      ASSESSMENT AND PLAN:   CAD -   The patient has no new sypmtoms.  No further cardiovascular testing is indicated.  We will continue with aggressive risk reduction and meds as listed.  Ischemic CM -    She is euvolemic.  She has not tolerated any titration of her meds.  She will remain on the meds as listed.  I will consider an echo when I see her again.   HL -    I was able to review recent labs. LDL is 24 with an HDL of 100 and January. No change in therapy is indicated.   ICD -     She is up to date with follow up.   She did have some increased volume when she traveled in [place] but this returned to baseline.   Current medicines are reviewed at length with the patient today.  The patient does not have concerns regarding medicines.  The following changes have been made:  None  Labs/ tests ordered today include:  None  No orders of the defined types were placed in this encounter.    Disposition:   FU with me in October.   Signed, Hailey Breeding, MD  03/30/2017 8:42 AM    [facility]

## 2017-03-27 NOTE — Progress Notes (Signed)
[address]       [place] [address]             [phone]     CARDIOTHORACIC SURGERY OFFICE NOTE  Primary Cardiologist is Minus Breeding, MD PCP is Leanna Battles, MD   HPI:  Patient is a 71 year old female with coronary artery disease and ischemic cardiomyopathy who returns to the office today for routine follow-up more than one year status post emergency coronary artery bypass grafting 2 on 02/19/2016 for critical left main coronary artery disease with acute evolving ST segment elevation myocardial infarction. Her postoperative recovery was remarkably uneventful and she was last seen here in our office on 03/21/2016 at which time she was doing quite well. Since then she has continued to do very well from a cardiac standpoint. She underwent implantation of a ICD last July for primary prevention and she completed the outpatient cardiac rehabilitation program. She has been followed regularly by Dr. Percival Spanish line and is scheduled to see him later this week. She reports that she is doing exceptionally well. She states that she has normal exercise tolerance with no limitations whatsoever. She specifically denies any symptoms of exertional chest pain, chest tightness, or shortness of breath. She just got back from a trip to [place] which included a great deal of walking and she states that she had a fabulous time with normal exercise tolerance. She is not having palpitations or dizzy spells and her defibrillator has never fired. Overall she feels well and has no complaints. She reports no physical limitations whatsoever.   Current Outpatient Prescriptions  Medication Sig Dispense Refill  . aspirin 81 MG tablet Take 81 mg by mouth daily.    Marland Kitchen atorvastatin (LIPITOR) 80 MG tablet Take 80 mg by mouth daily.    . calcium-vitamin D (OSCAL WITH D) 500-200 MG-UNIT per tablet Take 1 tablet by mouth 2 (two) times daily.    . clopidogrel (PLAVIX) 75 MG tablet TAKE 1 TABLET  DAILY 30 tablet 6  . Co-Enzyme Q10 200 MG CAPS Take 1 capsule by mouth daily.     Marland Kitchen ezetimibe (ZETIA) 10 MG tablet Take 10 mg by mouth daily.    Marland Kitchen losartan (COZAAR) 25 MG tablet Take 25 mg by mouth daily.    . Magnesium 500 MG TABS Take 1 tablet by mouth daily.     . metoprolol succinate (TOPROL XL) 25 MG 24 hr tablet Take 0.5 tablets (12.5 mg total) by mouth daily. Hold medication if your SBP is less than 100 (top number) 45 tablet 2  . Multiple Vitamin (MULTIVITAMIN) tablet Take 1 tablet by mouth daily.    Marland Kitchen NITROSTAT 0.4 MG SL tablet Place 1 tablet under the tongue every 5 (five) minutes x 3 doses as needed for chest pain.     No current facility-administered medications for this visit.       Physical Exam:   BP 114/68 (BP Location: Right Arm, Patient Position: Sitting, Cuff Size: Large)   Pulse (!) 59   Resp 16   Ht 5\' 6"  (1.676 m)   Wt 157 lb (71.2 kg)   LMP  (LMP Unknown)   SpO2 99% Comment: ON RA  BMI 25.34 kg/m   General:  Well-appearing  Chest:   Clear to auscultation  CV:   Regular rate and rhythm without murmur  Incisions:  Completely healed  Abdomen:  Soft nontender  Extremities:  Warm and well-perfused, no edema  Diagnostic Tests:  n/a  Impression:  Patient is doing very well more than one year status post emergency coronary artery bypass grafting 2 for critical left main disease with acute evolving myocardial infarction.  Plan:  We have not recommended any changes to the patient's current medications. However, it might be reasonable to consider stopping Plavix in the near future.  We will leave this to Dr. Rosezella Florida discretion. All the patient's questions have been addressed. In the future she will call and return to see [place] only should specific problems or questions arise.  I spent in excess of 15 minutes during the conduct of this office consultation and >50% of this time involved direct face-to-face encounter with the patient for counseling and/or  coordination of their care.    Valentina Gu. Roxy Manns, MD 03/27/2017 10:13 AM

## 2017-03-27 NOTE — Patient Instructions (Signed)
Continue all previous medications without any changes at this time  Make every effort to stay physically active, get some type of exercise on a regular basis, and stick to a "heart healthy diet".  The long term benefits for regular exercise and a healthy diet are critically important to your overall health and wellbeing.  

## 2017-03-28 NOTE — Progress Notes (Signed)
Remote ICD transmission.   

## 2017-03-29 ENCOUNTER — Ambulatory Visit (INDEPENDENT_AMBULATORY_CARE_PROVIDER_SITE_OTHER): Payer: Medicare Other | Admitting: Cardiology

## 2017-03-29 ENCOUNTER — Encounter: Payer: Self-pay | Admitting: Cardiology

## 2017-03-29 VITALS — BP 116/72 | HR 71 | Ht 67.0 in | Wt 158.0 lb

## 2017-03-29 DIAGNOSIS — E785 Hyperlipidemia, unspecified: Secondary | ICD-10-CM | POA: Diagnosis not present

## 2017-03-29 DIAGNOSIS — I255 Ischemic cardiomyopathy: Secondary | ICD-10-CM | POA: Diagnosis not present

## 2017-03-29 DIAGNOSIS — I2510 Atherosclerotic heart disease of native coronary artery without angina pectoris: Secondary | ICD-10-CM

## 2017-03-29 LAB — CUP PACEART REMOTE DEVICE CHECK
Battery Remaining Longevity: 83 mo
Battery Remaining Percentage: 85 %
Battery Voltage: 2.99 V
Brady Statistic AP VP Percent: 1 %
Brady Statistic AP VS Percent: 17 %
Brady Statistic AS VS Percent: 75 %
Date Time Interrogation Session: 20180507060018
HIGH POWER IMPEDANCE MEASURED VALUE: 74 Ohm
HighPow Impedance: 74 Ohm
Implantable Lead Implant Date: 20170727
Implantable Lead Location: 753859
Implantable Lead Location: 753860
Implantable Pulse Generator Implant Date: 20170727
Lead Channel Impedance Value: 430 Ohm
Lead Channel Pacing Threshold Amplitude: 0.75 V
Lead Channel Pacing Threshold Pulse Width: 0.5 ms
Lead Channel Setting Pacing Amplitude: 2 V
Lead Channel Setting Pacing Amplitude: 2.5 V
Lead Channel Setting Pacing Pulse Width: 0.5 ms
Lead Channel Setting Sensing Sensitivity: 0.5 mV
MDC IDC LEAD IMPLANT DT: 20170727
MDC IDC MSMT LEADCHNL RA IMPEDANCE VALUE: 440 Ohm
MDC IDC MSMT LEADCHNL RA SENSING INTR AMPL: 5 mV
MDC IDC MSMT LEADCHNL RV PACING THRESHOLD AMPLITUDE: 0.75 V
MDC IDC MSMT LEADCHNL RV PACING THRESHOLD PULSEWIDTH: 0.5 ms
MDC IDC MSMT LEADCHNL RV SENSING INTR AMPL: 12 mV
MDC IDC PG SERIAL: 7261095
MDC IDC STAT BRADY AS VP PERCENT: 1 %
MDC IDC STAT BRADY RA PERCENT PACED: 4.5 %
MDC IDC STAT BRADY RV PERCENT PACED: 1 %

## 2017-03-29 NOTE — Patient Instructions (Signed)
Medication Instructions:  STOP: Plavix  Labwork: None Ordered  Testing/Procedures: None Ordered  Follow-Up: Your physician recommends that you schedule a follow-up appointment in: October 2018   Any Other Special Instructions Will Be Listed Below (If Applicable).   If you need a refill on your cardiac medications before your next appointment, please call your pharmacy.

## 2017-03-30 ENCOUNTER — Encounter: Payer: Self-pay | Admitting: Cardiology

## 2017-03-31 ENCOUNTER — Encounter: Payer: Self-pay | Admitting: Cardiology

## 2017-04-14 ENCOUNTER — Other Ambulatory Visit: Payer: Self-pay | Admitting: Nurse Practitioner

## 2017-04-14 ENCOUNTER — Encounter: Payer: Self-pay | Admitting: Cardiology

## 2017-04-27 ENCOUNTER — Ambulatory Visit (INDEPENDENT_AMBULATORY_CARE_PROVIDER_SITE_OTHER): Payer: Medicare Other

## 2017-04-27 ENCOUNTER — Telehealth: Payer: Self-pay

## 2017-04-27 DIAGNOSIS — I5022 Chronic systolic (congestive) heart failure: Secondary | ICD-10-CM

## 2017-04-27 DIAGNOSIS — Z95810 Presence of automatic (implantable) cardiac defibrillator: Secondary | ICD-10-CM | POA: Diagnosis not present

## 2017-04-27 NOTE — Progress Notes (Signed)
EPIC Encounter for ICM Monitoring  Patient Name: Hailey Wright is a 71 y.o. female Date: 04/27/2017 Primary Care Physican: Leanna Battles, MD Primary Cardiologist: Hochrein Electrophysiologist: Curt Bears Dry Weight:last ICM known weight 145lbs         Attempted call to patient and unable to reach.  Left detailed message regarding transmission.  Transmission reviewed.    Thoracic impedance normal but was abnormal suggesting fluid accumulation from 04/03/2017 to 04/05/2017 and 04/13/2017 to 04/19/2017.  No diuretic  Labs: 12/15/2016 Creatinine 0.97, BUN 17, Potassium 5.0, Sodium 144, EGFR 57  Recommendations: Left voice mail with ICM number and encouraged to call for fluid symptoms.  Follow-up plan: ICM clinic phone appointment on 06/26/2017.  Office appointment scheduled on 05/31/2017 with Dr. Curt Bears.  Copy of ICM check sent to device physician.   3 month ICM trend: 04/27/2017   1 Year ICM trend:      [name], RN 04/27/2017 11:44 AM

## 2017-04-27 NOTE — Telephone Encounter (Signed)
Remote ICM transmission received.  Attempted patient call and left detailed message regarding transmission and next ICM scheduled for 06/26/2017 and defib office check with Dr Curt Bears 05/31/2017.  Advised to return call for any fluid symptoms or questions.

## 2017-05-12 ENCOUNTER — Encounter: Payer: Self-pay | Admitting: Cardiology

## 2017-05-16 ENCOUNTER — Encounter: Payer: Self-pay | Admitting: Cardiology

## 2017-05-16 NOTE — Telephone Encounter (Signed)
Spoke with pt letting her know Dr Percival Spanish want her to stop plavix when she is finish with her last pill.. Pt voice understanding

## 2017-05-25 ENCOUNTER — Encounter: Payer: Self-pay | Admitting: Cardiology

## 2017-05-25 ENCOUNTER — Ambulatory Visit (INDEPENDENT_AMBULATORY_CARE_PROVIDER_SITE_OTHER): Payer: Medicare Other | Admitting: Cardiology

## 2017-05-25 VITALS — BP 124/64 | HR 63 | Ht 67.0 in | Wt 163.0 lb

## 2017-05-25 DIAGNOSIS — I5022 Chronic systolic (congestive) heart failure: Secondary | ICD-10-CM | POA: Diagnosis not present

## 2017-05-25 DIAGNOSIS — R0600 Dyspnea, unspecified: Secondary | ICD-10-CM | POA: Diagnosis not present

## 2017-05-25 DIAGNOSIS — Z79899 Other long term (current) drug therapy: Secondary | ICD-10-CM | POA: Diagnosis not present

## 2017-05-25 DIAGNOSIS — Z6826 Body mass index (BMI) 26.0-26.9, adult: Secondary | ICD-10-CM | POA: Diagnosis not present

## 2017-05-25 DIAGNOSIS — M7062 Trochanteric bursitis, left hip: Secondary | ICD-10-CM | POA: Diagnosis not present

## 2017-05-25 DIAGNOSIS — I5023 Acute on chronic systolic (congestive) heart failure: Secondary | ICD-10-CM

## 2017-05-25 DIAGNOSIS — I255 Ischemic cardiomyopathy: Secondary | ICD-10-CM

## 2017-05-25 MED ORDER — FUROSEMIDE 40 MG PO TABS: 40.0000 mg | ORAL_TABLET | Freq: Every day | ORAL | 3 refills | Status: DC

## 2017-05-25 NOTE — Patient Instructions (Signed)
Medication Instructions:  START lasix 40mg  once daily  Labwork: BMET, BNP today  Testing/Procedures: Your physician has requested that you have an echocardiogram. Echocardiography is a painless test that uses sound waves to create images of your heart. It provides your doctor with information about the size and shape of your heart and how well your heart's chambers and valves are working. This procedure takes approximately one hour. There are no restrictions for this procedure. -this will done at our [facility] location: [place]: Your physician recommends that you schedule a follow-up appointment in: 2 weeks with APP   Any Other Special Instructions Will Be Listed Below (If Applicable).     If you need a refill on your cardiac medications before your next appointment, please call your pharmacy.

## 2017-05-25 NOTE — Progress Notes (Signed)
Cardiology Office Note   Date:  05/25/2017   ID:  Hailey Wright, DOB 11/12/1946, MRN [id number]  PCP:  Leanna Battles, MD  Cardiologist:   Minus Breeding, MD   Chief Complaint  Patient presents with  . Shortness of Breath     History of Present Illness: Hailey Wright is a 71 y.o. female who presents for follow of CAD and ischemic cardiomyopathy.  She has a history of known coronary disease as previously described. She had a negative stress perfusion study in the fall of 2016.  However, she came back with symptoms of acute onset chest pain. The plan was an elective cardiac catheterization. However, prior to that presented with an acute anterior myocardial infarction. She had occlusion of the LAD with acute thrombus in the circumflex. She did have chronic occlusion of her right coronary artery. She was placed on intra-aortic balloon pump and had emergent CABG with a LIMA to the LAD and SVG to OM1. She is left with an ejection fraction of approximately 30%. In July of last year she had an ICD placed.    She was doing well and I saw her in May. However, over the last 2 weeks she's had increasing shortness of breath. She feels like she can't get a deep breath. She did have a device interrogation in early June and her impedance was normal. Her weights have been stable. However, last night she was doing some coughing when she was lying down and had to sleep propped up on pillows. She's not gotten a hold of any salt. She thinks her fluid intake has been consistent and low. She's not having any palpitations, presyncope or syncope. She denies any chest pressure, neck or arm discomfort. Her cough is nonproductive. She's not had any fevers or chills.   Past Medical History:  Diagnosis Date  . Acute ST elevation myocardial infarction (STEMI) involving left anterior descending coronary artery ([place]) 02/18/2016  . AICD (automatic cardioverter/defibrillator) present   . CAD (coronary artery disease)  06/04/2013  . Coronary artery thrombosis ([place]) 02/18/2016  . DVT (deep venous thrombosis) ([place]) 1980s   RLE  . Hepatitis A infection 1987  . Hyperlipidemia   . Ischemic cardiomyopathy    a. Echo 5/17: EF 25-30%, anteroseptal, anterior, anteroseptal and apical akinesis, possible inferior hypokinesis, grade 1 diastolic dysfunction, no evidence of thrombus, trivial AI, mild MR, mild LAE  . MI (myocardial infarction) ([place]) 1991   Inferior treated with TPA 1991, POBA RCA 1996  . Pneumonia 1980s X 1  . RAS (renal artery stenosis) ([facility])    Right renal stent 2005  . S/P Emergency CABG x 2 02/18/2016   LIMA to LAD, SVG to OM1, EVH via right thigh  . Type II diabetes mellitus ([place])     Past Surgical History:  Procedure Laterality Date  . CARDIAC CATHETERIZATION N/A 02/18/2016   Procedure: Left Heart Cath and Coronary Angiography;  Surgeon: Leonie Man, MD;  Location: [facility];  Service: Cardiovascular;  Laterality: N/A;  . CORONARY ANGIOPLASTY  1991  . CORONARY ARTERY BYPASS GRAFT N/A 02/18/2016   Procedure: CORONARY ARTERY BYPASS GRAFTING (CABG) times 2 using left internal mammary and right greater saphenous vein;  Surgeon: Rexene Alberts, MD;  Location: [facility];  Service: Open Heart Surgery;  Laterality: N/A;  . EP IMPLANTABLE DEVICE N/A 06/16/2016   Procedure: ICD Implant;  Surgeon: Will Meredith Leeds, MD;  Location: [facility];  Service: Cardiovascular;  Laterality: N/A;  .  RENAL ARTERY STENT Right 2005  . RHINOPLASTY Bilateral 1968  . TONSILLECTOMY AND ADENOIDECTOMY  1950s     Current Outpatient Prescriptions  Medication Sig Dispense Refill  . aspirin 81 MG tablet Take 81 mg by mouth daily.    Marland Kitchen atorvastatin (LIPITOR) 80 MG tablet Take 80 mg by mouth daily.    . calcium-vitamin D (OSCAL WITH D) 500-200 MG-UNIT per tablet Take 1 tablet by mouth 2 (two) times daily.    Marland Kitchen Co-Enzyme Q10 200 MG CAPS Take 1 capsule by mouth daily.     Marland Kitchen ezetimibe (ZETIA) 10 MG tablet Take  10 mg by mouth daily.    . furosemide (LASIX) 40 MG tablet Take 1 tablet (40 mg total) by mouth daily. 90 tablet 3  . losartan (COZAAR) 25 MG tablet Take 25 mg by mouth daily.    . Magnesium 500 MG TABS Take 1 tablet by mouth daily.     . Multiple Vitamin (MULTIVITAMIN) tablet Take 1 tablet by mouth daily.    Marland Kitchen NITROSTAT 0.4 MG SL tablet Place 1 tablet under the tongue every 5 (five) minutes x 3 doses as needed for chest pain.    . TOPROL XL 25 MG 24 hr tablet TAKE ONE-HALF (1/2) TABLET DAILY, HOLD MEDICATION IF SYSTOLIC BLOOD PRESSURE IS LESS THAN 100 (TOP NUMBER) 45 tablet 2   No current facility-administered medications for this visit.     Allergies:   Patient has no known allergies.    ROS:  Please see the history of present illness.   Otherwise, review of systems are positive for none.   All other systems are reviewed and negative.    PHYSICAL EXAM: VS:  BP 124/64   Pulse 63   Ht 5\' 7"  (1.702 m)   Wt 163 lb (73.9 kg)   LMP  (LMP Unknown)   BMI 25.53 kg/m  , BMI Body mass index is 25.53 kg/m. GENERAL:  Well appearing NECK:  No jugular venous distention, waveform within normal limits, carotid upstroke brisk and symmetric, no bruits, no thyromegaly LUNGS:  Clear to auscultation bilaterally CHEST:  Well healed ICD pocket HEART:  PMI not displaced or sustained,S1 and S2 within normal limits, no S3, no S4, no clicks, no rubs, soft brief left lower sternal border murmur, no diastolic murmurs ABD:  Flat, positive bowel sounds normal in frequency in pitch, no bruits, no rebound, no guarding, no midline pulsatile mass, no hepatomegaly, no splenomegaly EXT:  2 plus pulses throughout, no edema, no cyanosis no clubbing   EKG:  EKG is  ordered today. Atrial paced rhythm, premature ectopic complexes, axis within normal limits, intervals within normal limits, no acute ST-T wave changes.  Recent Labs: 06/15/2016: BUN 16; Creat 0.94; Hemoglobin 13.9; Platelets 202; Potassium 5.1; Sodium 139      Lipid Panel No results found for: CHOL, TRIG, HDL, CHOLHDL, VLDL, LDLCALC, LDLDIRECT    Wt Readings from Last 3 Encounters:  05/25/17 163 lb (73.9 kg)  03/29/17 158 lb (71.7 kg)  03/27/17 157 lb (71.2 kg)       Other studies Reviewed: Additional studies/ records that were reviewed today include: Labs Review of the above records demonstrates:      ASSESSMENT AND PLAN:   CAD -   The patient has no new symptoms consistent with ischemia .      At this point I'm not planning an ischemia work up.     Ischemic CM -    Symptomatically she seems to have acute  on chronic systolic heart failure.  I am going to give her Lasix 40 mg daily. I'm going to check a basic metabolic profile and a BNP today. I'm going to repeat an echocardiogram. She'll let me know if she has any increasing shortness of breath or she responds. She is actually seeing Dr. Curt Bears in 6 days and can have her symptoms reassessed. She's not tolerated a lot of med titration but were going to have to try to do this in the future. Pending her blood pressure and response to the med changes listed which try to increase her Cozaar when she seen back.  HL -  her labs were as above. She'll continue on the meds as listed.  ICD -     She is  up to date with follow up.  I did review her tracing for her impedance which was normal last month but this will be checked again next week.    Current medicines are reviewed at length with the patient today.  The patient does not have concerns regarding medicines.  The following changes have been made:  As above.  Labs/ tests ordered today include:    Orders Placed This Encounter  Procedures  . Basic metabolic panel  . Pro b natriuretic peptide (BNP)  . EKG 12-Lead  . ECHOCARDIOGRAM COMPLETE     Disposition:   FU with with Dr. Curt Bears next week and with an APP in this office 1 - 2 weeks after that.    Signed, Minus Breeding, MD  05/25/2017 9:18 PM    [facility]

## 2017-05-26 LAB — BASIC METABOLIC PANEL
BUN/Creatinine Ratio: 25 (ref 12–28)
BUN: 22 mg/dL (ref 8–27)
CO2: 23 mmol/L (ref 20–29)
Calcium: 10.4 mg/dL — ABNORMAL HIGH (ref 8.7–10.3)
Chloride: 101 mmol/L (ref 96–106)
Creatinine, Ser: 0.88 mg/dL (ref 0.57–1.00)
GFR, EST AFRICAN AMERICAN: 78 mL/min/{1.73_m2} (ref >59)
GFR, EST NON AFRICAN AMERICAN: 67 mL/min/{1.73_m2} (ref >59)
Glucose: 96 mg/dL (ref 65–99)
POTASSIUM: 4.6 mmol/L (ref 3.5–5.2)
Sodium: 142 mmol/L (ref 134–144)

## 2017-05-26 LAB — PRO B NATRIURETIC PEPTIDE: NT-Pro BNP: 316 pg/mL — ABNORMAL HIGH (ref 0–301)

## 2017-05-31 ENCOUNTER — Encounter: Payer: Self-pay | Admitting: Cardiology

## 2017-05-31 ENCOUNTER — Ambulatory Visit (INDEPENDENT_AMBULATORY_CARE_PROVIDER_SITE_OTHER): Payer: Medicare Other | Admitting: Cardiology

## 2017-05-31 VITALS — BP 120/72 | HR 60 | Ht 67.0 in | Wt 164.0 lb

## 2017-05-31 DIAGNOSIS — I255 Ischemic cardiomyopathy: Secondary | ICD-10-CM

## 2017-05-31 DIAGNOSIS — I25810 Atherosclerosis of coronary artery bypass graft(s) without angina pectoris: Secondary | ICD-10-CM | POA: Diagnosis not present

## 2017-05-31 DIAGNOSIS — E782 Mixed hyperlipidemia: Secondary | ICD-10-CM | POA: Diagnosis not present

## 2017-05-31 DIAGNOSIS — Z95810 Presence of automatic (implantable) cardiac defibrillator: Secondary | ICD-10-CM

## 2017-05-31 DIAGNOSIS — I5022 Chronic systolic (congestive) heart failure: Secondary | ICD-10-CM | POA: Diagnosis not present

## 2017-05-31 LAB — CUP PACEART INCLINIC DEVICE CHECK
Battery Remaining Longevity: 88 mo
Brady Statistic RV Percent Paced: 0 %
Date Time Interrogation Session: 20180711105713
HIGH POWER IMPEDANCE MEASURED VALUE: 81 Ohm
Implantable Lead Implant Date: 20170727
Implantable Lead Location: 753859
Implantable Lead Location: 753860
Implantable Pulse Generator Implant Date: 20170727
Lead Channel Impedance Value: 425 Ohm
Lead Channel Pacing Threshold Amplitude: 1 V
Lead Channel Pacing Threshold Pulse Width: 0.5 ms
Lead Channel Sensing Intrinsic Amplitude: 12 mV
Lead Channel Sensing Intrinsic Amplitude: 5 mV
Lead Channel Setting Pacing Amplitude: 2 V
Lead Channel Setting Pacing Amplitude: 2.5 V
Lead Channel Setting Sensing Sensitivity: 0.5 mV
MDC IDC LEAD IMPLANT DT: 20170727
MDC IDC MSMT LEADCHNL RA IMPEDANCE VALUE: 437.5 Ohm
MDC IDC MSMT LEADCHNL RA PACING THRESHOLD AMPLITUDE: 0.5 V
MDC IDC MSMT LEADCHNL RA PACING THRESHOLD PULSEWIDTH: 0.5 ms
MDC IDC PG SERIAL: 7261095
MDC IDC SET LEADCHNL RV PACING PULSEWIDTH: 0.5 ms
MDC IDC STAT BRADY RA PERCENT PACED: 4.5 %

## 2017-05-31 NOTE — Patient Instructions (Signed)
Medication Instructions:    Your physician recommends that you continue on your current medications as directed. Please refer to the Current Medication list given to you today.  --- If you need a refill on your cardiac medications before your next appointment, please call your pharmacy. ---  Labwork:  None ordered  Testing/Procedures:  None ordered  Follow-Up: Remote monitoring is used to monitor your Pacemaker of ICD from home. This monitoring reduces the number of office visits required to check your device to one time per year. It allows [place] to keep an eye on the functioning of your device to ensure it is working properly. You are scheduled for a device check from home on 06/26/2017. You may send your transmission at any time that day. If you have a wireless device, the transmission will be sent automatically. After your physician reviews your transmission, you will receive a postcard with your next transmission date.   Your physician wants you to follow-up in: 1 year with Dr. Curt Bears.  You will receive a reminder letter in the mail two months in advance. If you don't receive a letter, please call our office to schedule the follow-up appointment.   Thank you for choosing [facility]!!   Trinidad Curet, RN [phone]

## 2017-05-31 NOTE — Progress Notes (Signed)
Electrophysiology Office Note   Date:  05/31/2017   ID:  Hailey Wright, DOB 1946/11/01, MRN [id number]  PCP:  Leanna Battles, MD  Cardiologist:  [place] Primary Electrophysiologist:  Hulda Reddix Meredith Leeds, MD    Chief Complaint  Patient presents with  . Defib Check    Ischemic cardiomyopathy/Chronic systolic HF  . Shortness of Breath  . Dizziness     History of Present Illness: Hailey Wright is a 71 y.o. female who presents today for electrophysiology evaluation.   Hx CAD and ischemic cardiomyopathy. In March 2017, had emergent CABG with LIMA to the LAD and SVG to OM1. She is left with an ejection fraction of approximately 30%.  Had ICD placed 06/17/16.   Today, denies symptoms of palpitations, chest pain, orthopnea, PND, lower extremity edema, claudication,  presyncope, syncope, bleeding, or neurologic sequela. The patient is tolerating medications without difficulties and is otherwise without complaint today. He was recently seen for shortness of breath. Her shortness of breath is improved with Lasix daily. She feels that she is back to her baseline weight. She is able to do most of her daily activities. She does complain of occasional dizziness, this occurs when she is going from a sitting to standing position.   Past Medical History:  Diagnosis Date  . Acute ST elevation myocardial infarction (STEMI) involving left anterior descending coronary artery ([place]) 02/18/2016  . AICD (automatic cardioverter/defibrillator) present   . CAD (coronary artery disease) 06/04/2013  . Coronary artery thrombosis ([place]) 02/18/2016  . DVT (deep venous thrombosis) ([place]) 1980s   RLE  . Hepatitis A infection 1987  . Hyperlipidemia   . Ischemic cardiomyopathy    a. Echo 5/17: EF 25-30%, anteroseptal, anterior, anteroseptal and apical akinesis, possible inferior hypokinesis, grade 1 diastolic dysfunction, no evidence of thrombus, trivial AI, mild MR, mild LAE  . MI (myocardial infarction) ([place]) 1991   Inferior treated with TPA 1991, POBA RCA 1996  . Pneumonia 1980s X 1  . RAS (renal artery stenosis) ([facility])    Right renal stent 2005  . S/P Emergency CABG x 2 02/18/2016   LIMA to LAD, SVG to OM1, EVH via right thigh  . Type II diabetes mellitus ([place])    Past Surgical History:  Procedure Laterality Date  . CARDIAC CATHETERIZATION N/A 02/18/2016   Procedure: Left Heart Cath and Coronary Angiography;  Surgeon: Leonie Man, MD;  Location: [facility];  Service: Cardiovascular;  Laterality: N/A;  . CORONARY ANGIOPLASTY  1991  . CORONARY ARTERY BYPASS GRAFT N/A 02/18/2016   Procedure: CORONARY ARTERY BYPASS GRAFTING (CABG) times 2 using left internal mammary and right greater saphenous vein;  Surgeon: Rexene Alberts, MD;  Location: [facility];  Service: Open Heart Surgery;  Laterality: N/A;  . EP IMPLANTABLE DEVICE N/A 06/16/2016   Procedure: ICD Implant;  Surgeon: Shareen Capwell Meredith Leeds, MD;  Location: [facility];  Service: Cardiovascular;  Laterality: N/A;  . RENAL ARTERY STENT Right 2005  . RHINOPLASTY Bilateral 1968  . TONSILLECTOMY AND ADENOIDECTOMY  1950s     Current Outpatient Prescriptions  Medication Sig Dispense Refill  . aspirin 81 MG tablet Take 81 mg by mouth daily.    Marland Kitchen atorvastatin (LIPITOR) 80 MG tablet Take 80 mg by mouth daily.    . calcium-vitamin D (OSCAL WITH D) 500-200 MG-UNIT per tablet Take 1 tablet by mouth 2 (two) times daily.    Marland Kitchen Co-Enzyme Q10 200 MG CAPS Take 1 capsule by mouth daily.     Marland Kitchen  ezetimibe (ZETIA) 10 MG tablet Take 10 mg by mouth daily.    . furosemide (LASIX) 40 MG tablet Take 1 tablet (40 mg total) by mouth daily. 90 tablet 3  . losartan (COZAAR) 25 MG tablet Take 25 mg by mouth daily.    . Magnesium 500 MG TABS Take 1 tablet by mouth daily.     . Multiple Vitamin (MULTIVITAMIN) tablet Take 1 tablet by mouth daily.    Marland Kitchen NITROSTAT 0.4 MG SL tablet Place 1 tablet under the tongue every 5 (five) minutes x 3 doses as needed for chest pain.      . TOPROL XL 25 MG 24 hr tablet TAKE ONE-HALF (1/2) TABLET DAILY, HOLD MEDICATION IF SYSTOLIC BLOOD PRESSURE IS LESS THAN 100 (TOP NUMBER) 45 tablet 2   No current facility-administered medications for this visit.     Allergies:   Patient has no known allergies.   Social History:  The patient  reports that she quit smoking about 5 years ago. Her smoking use included Cigarettes. She has a 40.00 pack-year smoking history. She has never used smokeless tobacco. She reports that she does not drink alcohol or use drugs.   Family History:  The patient's family history includes Heart attack (age of onset: 17) in her brother; Heart attack (age of onset: 38) in her father.    ROS:  Please see the history of present illness.   Otherwise, review of systems is positive for shortness of breath, cough, balance problems, dizziness.   All other systems are reviewed and negative.     PHYSICAL EXAM: VS:  BP 120/72   Pulse 60   Ht 5\' 7"  (1.702 m)   Wt 164 lb (74.4 kg)   LMP  (LMP Unknown)   BMI 25.69 kg/m  , BMI Body mass index is 25.69 kg/m. GEN: Well nourished, well developed, in no acute distress  HEENT: normal  Neck: no JVD, carotid bruits, or masses Cardiac: RRR; no murmurs, rubs, or gallops,no edema  Respiratory:  clear to auscultation bilaterally, normal work of breathing GI: soft, nontender, nondistended, + BS MS: no deformity or atrophy  Skin: warm and dry, device site well healed Neuro:  Strength and sensation are intact Psych: euthymic mood, full affect  EKG:  EKG is not ordered today. Personal review of the ekg ordered 05/25/17 shows A paced, V sense, PVCs, old septal infarct   Personal review of the device interrogation today. Results in [place]: 06/15/2016: Hemoglobin 13.9; Platelets 202 05/25/2017: BUN 22; Creatinine, Ser 0.88; NT-Pro BNP 316; Potassium 4.6; Sodium 142    Lipid Panel  No results found for: CHOL, TRIG, HDL, CHOLHDL, VLDL, LDLCALC,  LDLDIRECT   Wt Readings from Last 3 Encounters:  05/31/17 164 lb (74.4 kg)  05/25/17 163 lb (73.9 kg)  03/29/17 158 lb (71.7 kg)      Other studies Reviewed: Additional studies/ records that were reviewed today include:  TTE 05/20/16 - Left ventricle: The cavity size was mildly dilated. Wall   thickness was normal. Systolic function was severely reduced. The   estimated ejection fraction was in the range of 25% to 30%. There   is akinesis of the anteroseptal and apical myocardium. Features   are consistent with a pseudonormal left ventricular filling   pattern, with concomitant abnormal relaxation and increased   filling pressure (grade 2 diastolic dysfunction). - Aortic valve: There was trivial regurgitation. - Mitral valve: Calcified annulus. Mildly thickened leaflets .   There was mild  regurgitation. - Left atrium: The atrium was moderately dilated.   ASSESSMENT AND PLAN:  1.  Ischemic cardiomyopathy: ICD placed 06/16/16. No ventricular arrhythmias noted. She did have 4 seconds of what appears to be in SVT on her device. She was asymptomatic. No changes at this time. She is on optimal medical therapy. I did talk to her about daily weights and adjusting her Lasix based on her weight changes.  2. CAD status post CABG: Currently no angina. Continue current therapy.  3. Hyperlipidemia: Continue statin  Current medicines are reviewed at length with the patient today.   The patient does not have concerns regarding her medicines.  The following changes were made today:  none  Labs/ tests ordered today include:  No orders of the defined types were placed in this encounter.    Disposition:   FU with Vesta [place] 12 months  Signed, Chalene Treu Meredith Leeds, MD  05/31/2017 9:12 AM     [facility] [address] [place] [address] [phone] (office) [phone] (fax)

## 2017-06-05 ENCOUNTER — Ambulatory Visit (HOSPITAL_COMMUNITY): Payer: Medicare Other | Attending: Cardiovascular Disease

## 2017-06-05 ENCOUNTER — Other Ambulatory Visit: Payer: Self-pay

## 2017-06-05 DIAGNOSIS — R0600 Dyspnea, unspecified: Secondary | ICD-10-CM | POA: Diagnosis not present

## 2017-06-05 DIAGNOSIS — I5023 Acute on chronic systolic (congestive) heart failure: Secondary | ICD-10-CM

## 2017-06-05 DIAGNOSIS — I081 Rheumatic disorders of both mitral and tricuspid valves: Secondary | ICD-10-CM | POA: Diagnosis not present

## 2017-06-07 NOTE — Progress Notes (Signed)
Cardiology Office Note    Date:  06/08/2017   ID:  Hailey Wright, DOB 12/26/1945, MRN [id number]  PCP:  Leanna Battles, MD  Cardiologist:  Dr. Percival Spanish Primary Electrophysiologist:  Will Meredith Leeds, MD        Chief Complaint  Patient presents with  . Follow-up    seen for Dr. Percival Spanish    History of Present Illness:  Hailey Wright is a 71 y.o. female with PMH of CAD s/p CABG x 2 (LIMA to LAD, SVG to OM1), ICM, h/o DVT, HLD, renal artery stenosis s/p R renal stent, DM II. She had a negative stress test in full 2016. Unfortunately, she came back with onset of acute chest pain. Before he underwent elective cardiac catheterization, he returned with acute anterior MI. She had occlusion of the LAD with acute thrombus in the left circumflex. She did have chronic occlusion of her RCA. She was placed on intra-aortic balloon pump and underwent emergent CABG with LIMA to LAD and SVG to OM1. She had a EF of 30%. She underwent ICD placement in July 2017.  She was seen by Dr. Percival Spanish on 05/25/2017 with increasing shortness of breath. Device interrogation in early June showed impedance was normal. Her weight was stable. However she deteriorated with increasing orthopnea. She was seen by Dr. Percival Spanish and was felt to be in acute on chronic systolic heart failure. Lasix 40 mg daily was started. She was evaluated by Dr. Curt Bears on 05/31/2017, she appears to have 4 seconds of SVT. Otherwise she was doing well and tolerating the Lasix dose. Repeat echocardiogram obtained on 06/05/2017 showed EF 35-40%, mild MR, mild-to-moderate TR.  She presents today for cardiology office visit. She has been compliant on Lasix 40 mg daily. She appears to be euvolemic on physical exam. Despite the recent shortness of breath, she did not have any weight gain. Even after she was placed on Lasix, she did not have any weight loss either. We discussed the importance of sodium and fluid restriction. She is no longer short of breath. She  says she has been switched to 40 mg Lasix on as-needed basis as device interrogation at Dr. Macky Lower office suggested she was dry. Since her visit with EP clinic, she has taken one dose of Lasix on Sunday and another dose yesterday. I will continue her on an as-needed dose of Lasix, however she has been instructed to contact cardiology if she requires more than 3 doses of 40 mg Lasix during one week. If so, I likely will switch her to 20 mg daily of scheduled Lasix instead.   Past Medical History:  Diagnosis Date  . Acute ST elevation myocardial infarction (STEMI) involving left anterior descending coronary artery ([place]) 02/18/2016  . AICD (automatic cardioverter/defibrillator) present   . CAD (coronary artery disease) 06/04/2013  . Coronary artery thrombosis ([place]) 02/18/2016  . DVT (deep venous thrombosis) ([place]) 1980s   RLE  . Hepatitis A infection 1987  . Hyperlipidemia   . Ischemic cardiomyopathy    a. Echo 5/17: EF 25-30%, anteroseptal, anterior, anteroseptal and apical akinesis, possible inferior hypokinesis, grade 1 diastolic dysfunction, no evidence of thrombus, trivial AI, mild MR, mild LAE  . MI (myocardial infarction) ([place]) 1991   Inferior treated with TPA 1991, POBA RCA 1996  . Pneumonia 1980s X 1  . RAS (renal artery stenosis) ([facility])    Right renal stent 2005  . S/P Emergency CABG x 2 02/18/2016   LIMA to LAD, SVG to OM1, EVH via right thigh  .  Type II diabetes mellitus ([place])     Past Surgical History:  Procedure Laterality Date  . CARDIAC CATHETERIZATION N/A 02/18/2016   Procedure: Left Heart Cath and Coronary Angiography;  Surgeon: Leonie Man, MD;  Location: [facility];  Service: Cardiovascular;  Laterality: N/A;  . CORONARY ANGIOPLASTY  1991  . CORONARY ARTERY BYPASS GRAFT N/A 02/18/2016   Procedure: CORONARY ARTERY BYPASS GRAFTING (CABG) times 2 using left internal mammary and right greater saphenous vein;  Surgeon: Rexene Alberts, MD;  Location: [facility];  Service:  Open Heart Surgery;  Laterality: N/A;  . EP IMPLANTABLE DEVICE N/A 06/16/2016   Procedure: ICD Implant;  Surgeon: Will Meredith Leeds, MD;  Location: [facility];  Service: Cardiovascular;  Laterality: N/A;  . RENAL ARTERY STENT Right 2005  . RHINOPLASTY Bilateral 1968  . TONSILLECTOMY AND ADENOIDECTOMY  1950s    Current Medications: Outpatient Medications Prior to Visit  Medication Sig Dispense Refill  . aspirin 81 MG tablet Take 81 mg by mouth daily.    Marland Kitchen atorvastatin (LIPITOR) 80 MG tablet Take 80 mg by mouth daily.    . calcium-vitamin D (OSCAL WITH D) 500-200 MG-UNIT per tablet Take 1 tablet by mouth 2 (two) times daily.    Marland Kitchen Co-Enzyme Q10 200 MG CAPS Take 1 capsule by mouth daily.     Marland Kitchen ezetimibe (ZETIA) 10 MG tablet Take 10 mg by mouth daily.    Marland Kitchen losartan (COZAAR) 25 MG tablet Take 25 mg by mouth daily.    . Magnesium 500 MG TABS Take 1 tablet by mouth daily.     . Multiple Vitamin (MULTIVITAMIN) tablet Take 1 tablet by mouth daily.    Marland Kitchen NITROSTAT 0.4 MG SL tablet Place 1 tablet under the tongue every 5 (five) minutes x 3 doses as needed for chest pain.    . TOPROL XL 25 MG 24 hr tablet TAKE ONE-HALF (1/2) TABLET DAILY, HOLD MEDICATION IF SYSTOLIC BLOOD PRESSURE IS LESS THAN 100 (TOP NUMBER) 45 tablet 2  . furosemide (LASIX) 40 MG tablet Take 1 tablet (40 mg total) by mouth daily. 90 tablet 3   No facility-administered medications prior to visit.      Allergies:   Patient has no known allergies.   Social History   Social History  . Marital status: Married    Spouse name: N/A  . Number of children: 1  . Years of education: N/A   Social History Main Topics  . Smoking status: Former Smoker    Packs/day: 1.00    Years: 40.00    Types: Cigarettes    Quit date: 06/05/2011  . Smokeless tobacco: Never Used  . Alcohol use No  . Drug use: No  . Sexual activity: Not Asked   Other Topics Concern  . None   Social History Narrative   Lives with husband.     Family  History:  The patient's family history includes Heart attack (age of onset: 52) in her brother; Heart attack (age of onset: 28) in her father.   ROS:   Please see the history of present illness.    ROS All other systems reviewed and are negative.   PHYSICAL EXAM:   VS:  BP 110/70   Pulse 68   Ht 5\' 7"  (1.702 m)   Wt 164 lb (74.4 kg)   LMP  (LMP Unknown)   BMI 25.69 kg/m    GEN: Well nourished, well developed, in no acute distress  HEENT: normal  Neck: no JVD, carotid bruits, or masses Cardiac: RRR; no murmurs, rubs, or gallops,no edema  Respiratory:  clear to auscultation bilaterally, normal work of breathing GI: soft, nontender, nondistended, + BS MS: no deformity or atrophy  Skin: warm and dry, no rash Neuro:  Alert and Oriented x 3, Strength and sensation are intact Psych: euthymic mood, full affect  Wt Readings from Last 3 Encounters:  06/08/17 164 lb (74.4 kg)  05/31/17 164 lb (74.4 kg)  05/25/17 163 lb (73.9 kg)      Studies/Labs Reviewed:   EKG:  EKG is not ordered today.    Recent Labs: 06/15/2016: Hemoglobin 13.9; Platelets 202 05/25/2017: NT-Pro BNP 316 06/08/2017: BUN 25; Creatinine, Ser 1.04; Potassium 4.7; Sodium 142   Lipid Panel No results found for: CHOL, TRIG, HDL, CHOLHDL, VLDL, LDLCALC, LDLDIRECT  Additional studies/ records that were reviewed today include:   Cath 02/18/2016 Conclusion    Ost LM to LM lesion, 60% stenosed.  Ost LAD-1 lesion, 99% stenosed. Ost LAD-2 lesion, 70% stenosed. Mid LAD lesion, 40% stenosed. Dist LAD lesion, 40% stenosed.  Prox Cx lesion, 95% stenosed - prior to OM1. Mid Cx lesion, 70% stenosed just prior to OM 2 takeoff  Ost RCA to Dist RCA lesion, 100% stenosed. PDA and posterolateral branch are filled via left-to-right collaterals  There is severe left ventricular systolic dysfunction. Elevated LVEDP    Patient has severe multivessel disease with thrombotic stenosis of the ostial LAD with proximal stenosis  distal to the thrombus. There is also apparent thrombus in the mid LAD as well as proximal circumflex with 95% stenosis of the early proximal circumflex. Additional thrombus is noted in the bifurcation of AV groove circumflex and OM 2. The RCA appears to be chronically occluded with left-to-right collaterals filling the PDA and posterior lateral branches.  Appears to be severe ischemic cardiomyopathy with acute combined systolic and diastolic heart failure.  After reviewing the angiographic images with severe LV dysfunction, it was clear the course of action was to contact CT surgery. Dr. Roxy Manns arrived rapidly to the Cath Lab and agreed to go patient to the OR emergently. As she was having ongoing 6/10 anginal pain, an intra-aortic balloon pump was placed.  The patient was taken directly from the Cath Lab to the operating room for emergent CABG.   Radial sheath will be removed with TR band to be applied in the surgical ICU following the operation     Echo 06/05/2017 LV EF: 35% -   40%  Study Conclusions  - Left ventricle: Systolic function was moderately reduced. The   estimated ejection fraction was in the range of 35% to 40%.   Severe hypokinesis of the mid-apicalanteroseptal and apical   myocardium. Akinesis of the basalinferior myocardium. - Aortic valve: Mildly to moderately calcified annulus. - Mitral valve: There was mild regurgitation. - Tricuspid valve: There was mild-moderate regurgitation   ASSESSMENT:    1. Chronic systolic CHF (congestive heart failure) ([place])   2. Coronary artery disease involving coronary bypass graft of native heart without angina pectoris   3. Hyperlipidemia, unspecified hyperlipidemia type   4. Controlled type 2 diabetes mellitus without complication, without long-term current use of insulin ([place])   5. ICD (implantable cardioverter-defibrillator) in place   6. Ischemic cardiomyopathy      PLAN:  In order of problems listed  above:  1. Chronic systolic heart failure: Blood pressure stable, she has been transitioned to 40 mg as needed dose of Lasix during recent EP visit.  I will continue her on the current dose, however she has been instructed to contact cardiology if she will require at least 3 doses of Lasix in a single week, if so, I will transition her to 20 mg daily of Lasix then. I will obtain a basic metabolic panel today.  2. CAD s/p CABG: No obvious angina. Recent echocardiogram actually shows ejection fraction improved.  3. ICM s/p ICD: followed by Dr. Curt Bears  4. Hyperlipidemia: On Lipitor and Zetia  5. DM 2: Not currently on any medication, will defer to primary care provider.    Medication Adjustments/Labs and Tests Ordered: Current medicines are reviewed at length with the patient today.  Concerns regarding medicines are outlined above.  Medication changes, Labs and Tests ordered today are listed in the Patient Instructions below. Patient Instructions  Medication Instructions:  CONTINUE Lasix; IF YOU HAVE TO TAKE LASIX AT LEAST 3 TIMES IN 1 WEEK CALL THE OFFICE   Labwork: Your physician recommends that you return for lab work in: TODAY-BMP  Testing/Procedures: None   Follow-Up: Your physician recommends that you schedule a follow-up appointment in: 3 Months with Dr Percival Spanish  Any Other Special Instructions Will Be Listed Below (If Applicable).  If you need a refill on your cardiac medications before your next appointment, please call your pharmacy.     Hilbert Corrigan, [place]  06/08/2017 6:36 PM    [facility], [place], [place]  [address] Phone: [phone]; Fax: [phone]

## 2017-06-08 ENCOUNTER — Ambulatory Visit (INDEPENDENT_AMBULATORY_CARE_PROVIDER_SITE_OTHER): Payer: Medicare Other | Admitting: Physician Assistant

## 2017-06-08 ENCOUNTER — Encounter: Payer: Self-pay | Admitting: Physician Assistant

## 2017-06-08 VITALS — BP 110/70 | HR 68 | Ht 67.0 in | Wt 164.0 lb

## 2017-06-08 DIAGNOSIS — Z95810 Presence of automatic (implantable) cardiac defibrillator: Secondary | ICD-10-CM

## 2017-06-08 DIAGNOSIS — E119 Type 2 diabetes mellitus without complications: Secondary | ICD-10-CM | POA: Diagnosis not present

## 2017-06-08 DIAGNOSIS — I255 Ischemic cardiomyopathy: Secondary | ICD-10-CM | POA: Diagnosis not present

## 2017-06-08 DIAGNOSIS — I25810 Atherosclerosis of coronary artery bypass graft(s) without angina pectoris: Secondary | ICD-10-CM | POA: Diagnosis not present

## 2017-06-08 DIAGNOSIS — E785 Hyperlipidemia, unspecified: Secondary | ICD-10-CM | POA: Diagnosis not present

## 2017-06-08 DIAGNOSIS — I5022 Chronic systolic (congestive) heart failure: Secondary | ICD-10-CM | POA: Diagnosis not present

## 2017-06-08 LAB — BASIC METABOLIC PANEL
BUN / CREAT RATIO: 24 (ref 12–28)
BUN: 25 mg/dL (ref 8–27)
CHLORIDE: 103 mmol/L (ref 96–106)
CO2: 24 mmol/L (ref 20–29)
Calcium: 9.9 mg/dL (ref 8.7–10.3)
Creatinine, Ser: 1.04 mg/dL — ABNORMAL HIGH (ref 0.57–1.00)
GFR calc Af Amer: 63 mL/min/{1.73_m2} (ref >59)
GFR calc non Af Amer: 55 mL/min/{1.73_m2} — ABNORMAL LOW (ref >59)
Glucose: 95 mg/dL (ref 65–99)
POTASSIUM: 4.7 mmol/L (ref 3.5–5.2)
Sodium: 142 mmol/L (ref 134–144)

## 2017-06-08 MED ORDER — FUROSEMIDE 40 MG PO TABS: 40.0000 mg | ORAL_TABLET | ORAL | 3 refills | Status: DC | PRN

## 2017-06-08 NOTE — Patient Instructions (Signed)
Medication Instructions:  CONTINUE Lasix; IF YOU HAVE TO TAKE LASIX AT LEAST 3 TIMES IN 1 WEEK CALL THE OFFICE   Labwork: Your physician recommends that you return for lab work in: TODAY-BMP  Testing/Procedures: None   Follow-Up: Your physician recommends that you schedule a follow-up appointment in: 3 Months with Dr Percival Spanish  Any Other Special Instructions Will Be Listed Below (If Applicable).  If you need a refill on your cardiac medications before your next appointment, please call your pharmacy.

## 2017-06-26 ENCOUNTER — Encounter: Payer: Self-pay | Admitting: Physician Assistant

## 2017-06-26 ENCOUNTER — Ambulatory Visit (INDEPENDENT_AMBULATORY_CARE_PROVIDER_SITE_OTHER): Payer: Medicare Other | Admitting: *Deleted

## 2017-06-26 DIAGNOSIS — I255 Ischemic cardiomyopathy: Secondary | ICD-10-CM

## 2017-06-26 DIAGNOSIS — Z95810 Presence of automatic (implantable) cardiac defibrillator: Secondary | ICD-10-CM

## 2017-06-26 DIAGNOSIS — I5022 Chronic systolic (congestive) heart failure: Secondary | ICD-10-CM | POA: Diagnosis not present

## 2017-06-26 NOTE — Progress Notes (Signed)
EPIC Encounter for ICM Monitoring  Patient Name: Hailey Wright is a 71 y.o. female Date: 06/26/2017 Primary Care Physican: Leanna Battles, MD Primary Cardiologist: Hochrein Electrophysiologist: Curt Bears Dry Weight: 160lbs       Heart Failure questions reviewed, pt asymptomatic at this time.  She took Furosemide on 7/22 and 7/24 due she thought she was retaining fluid.     Thoracic impedance above baseline since 06/21/2017 suggesting dryness.  Impedance was abnormal suggesting fluid accumulation from 7/11 to 7/21 and 7/26 to 7/30.   Patient has multiple peaks and valleys from being dry to fluid accumulation.   Prescribed dosage: Furosemide 40 mg 1 tablet daily as needed.  Has taken 3 times in last 2 weeks.  Labs: 12/15/2016 Creatinine 0.97, BUN 17, Potassium 5.0, Sodium 144, EGFR 57  Recommendations:  Patient said Almyra Deforest, [place] has been working with her to adjust Lasix.  She said he may start having her take Lasix 20 mg on daily basis if she is using prn Lasix more than 3 times a week.  Discussed that she should check her foods for salt content and to limit to 2000 mg daily.  She said they do not eat in restaurants very much.  Advised to drink enough fluids to stay hydrated but limit to 64 oz daily.     Follow-up plan: ICM clinic phone appointment on 07/27/2017.  Office appointment scheduled 09/28/2017 with Dr. Percival Spanish.  Copy of ICM check sent to Almyra Deforest, [place], Dr Percival Spanish and Dr Curt Bears for view.  Advised if any recommendations will call back.    3 month ICM trend: 06/26/2017   1 Year ICM trend:      [name], RN 06/26/2017 2:10 PM

## 2017-06-26 NOTE — Progress Notes (Signed)
ICD remote transmission received 

## 2017-06-27 NOTE — Progress Notes (Signed)
Remote ICD transmission.   

## 2017-06-28 ENCOUNTER — Encounter: Payer: Self-pay | Admitting: Cardiology

## 2017-07-17 ENCOUNTER — Encounter: Payer: Self-pay | Admitting: Cardiology

## 2017-07-18 LAB — CUP PACEART REMOTE DEVICE CHECK
Date Time Interrogation Session: 20180828192625
Implantable Lead Implant Date: 20170727
Implantable Lead Location: 753860
MDC IDC LEAD IMPLANT DT: 20170727
MDC IDC LEAD LOCATION: 753859
MDC IDC PG IMPLANT DT: 20170727
MDC IDC PG SERIAL: 7261095

## 2017-07-27 ENCOUNTER — Ambulatory Visit (INDEPENDENT_AMBULATORY_CARE_PROVIDER_SITE_OTHER): Payer: Medicare Other

## 2017-07-27 DIAGNOSIS — I5022 Chronic systolic (congestive) heart failure: Secondary | ICD-10-CM | POA: Diagnosis not present

## 2017-07-27 DIAGNOSIS — Z95810 Presence of automatic (implantable) cardiac defibrillator: Secondary | ICD-10-CM

## 2017-07-27 NOTE — Progress Notes (Signed)
EPIC Encounter for ICM Monitoring  Patient Name: Hailey Wright is a 71 y.o. female Date: 07/27/2017 Primary Care Physican: Leanna Battles, MD Primary Cardiologist: Hochrein Electrophysiologist: Curt Bears Dry Weight: 160lbs      Heart Failure questions reviewed, pt asymptomatic.   Thoracic impedance normal but was abnormal suggesting fluid accumulation from 8/16 to 8/20.  Prescribed dosage: Furosemide 40 mg 1 tablet daily as needed.  She took 1 Furosemide dose on 8/14 for the month.  Labs: 06/08/2017 Creatinine 1.04, BUN 25, Potassium 4.7, Sodium 142, EGFR 55-63 05/25/2017 Creatinine 0.88, BUN 22, Potassium 4.6, Sodium 142, EGFR 67-78 12/15/2016 Creatinine 0.97, BUN 17, Potassium 5.0, Sodium 144, EGFR 57  Recommendations: No changes.  Advised to limit salt intake to 2000 mg/day and fluid intake to < 2 liters/day.  Encouraged to call for fluid symptoms.  Follow-up plan: ICM clinic phone appointment on 09/01/2017.    Copy of ICM check sent to Dr. Curt Bears.   3 month ICM trend: 07/27/2017   1 Year ICM trend:      [name], RN 07/27/2017 10:49 AM

## 2017-08-05 DIAGNOSIS — Z23 Encounter for immunization: Secondary | ICD-10-CM | POA: Diagnosis not present

## 2017-09-01 ENCOUNTER — Ambulatory Visit (INDEPENDENT_AMBULATORY_CARE_PROVIDER_SITE_OTHER): Payer: Medicare Other

## 2017-09-01 ENCOUNTER — Telehealth: Payer: Self-pay

## 2017-09-01 DIAGNOSIS — I5022 Chronic systolic (congestive) heart failure: Secondary | ICD-10-CM

## 2017-09-01 DIAGNOSIS — Z95810 Presence of automatic (implantable) cardiac defibrillator: Secondary | ICD-10-CM

## 2017-09-01 NOTE — Progress Notes (Signed)
EPIC Encounter for ICM Monitoring  Patient Name: Hailey Wright is a 71 y.o. female Date: 09/01/2017 Primary Care Physican: Leanna Battles, MD Primary Cardiologist: Hochrein Electrophysiologist: Curt Bears Dry [place] ICM weight 160lbs           Attempted call to patient and unable to reach.  Left detailed message regarding transmission.  Transmission reviewed.    Thoracic impedance above normal from 10/4 until today suggesting dryness.  Impedance was abnormal suggesting fluid accumulation from 08/24/2017 to 08/27/2017.  Prescribed dosage: Furosemide 40 mg 1 tablet daily as needed.    Labs: 06/08/2017 Creatinine 1.04, BUN 25, Potassium 4.7, Sodium 142, EGFR 55-63 05/25/2017 Creatinine 0.88, BUN 22, Potassium 4.6, Sodium 142, EGFR 67-78 12/15/2016 Creatinine 0.97, BUN 17, Potassium 5.0, Sodium 144, EGFR 57  Recommendations: Left voice mail with ICM number and encouraged to call if experiencing any fluid symptoms.  Follow-up plan: ICM clinic phone appointment on 10/02/2017.  Office appointment scheduled 09/28/2017 with Dr. Percival Spanish.  Copy of ICM check sent to Dr. Curt Bears.   3 month ICM trend: 09/01/2017   1 Year ICM trend:      [name], RN 09/01/2017 10:34 AM

## 2017-09-01 NOTE — Telephone Encounter (Signed)
Remote ICM transmission received.  Attempted call to patient and left detailed message per DPR regarding transmission and next ICM scheduled for 10/02/2017.  Advised to return call for any fluid symptoms or questions.    

## 2017-09-27 NOTE — Progress Notes (Signed)
Cardiology Office Note   Date:  09/28/2017   ID:  Hailey Wright, DOB 1946/05/03, MRN [id number]  PCP:  Leanna Battles, MD  Cardiologist:   Minus Breeding, MD   Chief Complaint  Patient presents with  . Coronary Artery Disease     History of Present Illness: Hailey Wright is a 71 y.o. female who presents for follow of CAD and ischemic cardiomyopathy.  She has a history of known coronary disease as previously described. She had a negative stress perfusion study in the fall of 2016.  However, she came back with symptoms of acute onset chest pain. The plan was an elective cardiac catheterization. However, prior to that presented with an acute anterior myocardial infarction. She had occlusion of the LAD with acute thrombus in the circumflex. She did have chronic occlusion of her right coronary artery. She was placed on intra-aortic balloon pump and had emergent CABG with a LIMA to the LAD and SVG to OM1. She is left with an ejection fraction of approximately 30%. In July of last year she had an ICD placed.   Her EF improved on echo in July to 35%.    She returns for follow up.  She is doing well and going to the [facility].  The patient denies any new symptoms such as chest discomfort, neck or arm discomfort. There has been no new shortness of breath, PND or orthopnea. There have been no reported palpitations, presyncope or syncope.  She is rarely needing to use Lasix.   Past Medical History:  Diagnosis Date  . Acute ST elevation myocardial infarction (STEMI) involving left anterior descending coronary artery ([place]) 02/18/2016  . AICD (automatic cardioverter/defibrillator) present   . CAD (coronary artery disease) 06/04/2013  . Coronary artery thrombosis ([place]) 02/18/2016  . DVT (deep venous thrombosis) ([facility]) 1980s   RLE  . Hepatitis A infection 1987  . Hyperlipidemia   . Ischemic cardiomyopathy    a. Echo 5/17: EF 25-30%, anteroseptal, anterior, anteroseptal and apical akinesis, possible inferior  hypokinesis, grade 1 diastolic dysfunction, no evidence of thrombus, trivial AI, mild MR, mild LAE  . MI (myocardial infarction) ([place]) 1991   Inferior treated with TPA 1991, POBA RCA 1996  . Pneumonia 1980s X 1  . RAS (renal artery stenosis) ([facility])    Right renal stent 2005  . S/P Emergency CABG x 2 02/18/2016   LIMA to LAD, SVG to OM1, EVH via right thigh  . Type II diabetes mellitus ([place])     Past Surgical History:  Procedure Laterality Date  . CORONARY ANGIOPLASTY  1991  . RENAL ARTERY STENT Right 2005  . RHINOPLASTY Bilateral 1968  . TONSILLECTOMY AND ADENOIDECTOMY  1950s     Current Outpatient Medications  Medication Sig Dispense Refill  . aspirin 81 MG tablet Take 81 mg by mouth daily.    Marland Kitchen atorvastatin (LIPITOR) 80 MG tablet Take 80 mg by mouth daily.    . calcium-vitamin D (OSCAL WITH D) 500-200 MG-UNIT per tablet Take 1 tablet by mouth 2 (two) times daily.    . carboxymethylcellulose (REFRESH PLUS) 0.5 % SOLN Place 0.5 drops into both eyes 4 (four) times daily.    Marland Kitchen Co-Enzyme Q10 200 MG CAPS Take 1 capsule by mouth daily.     Marland Kitchen ezetimibe (ZETIA) 10 MG tablet Take 10 mg by mouth daily.    . furosemide (LASIX) 40 MG tablet Take 1 tablet (40 mg total) by mouth as needed. 90 tablet 3  . losartan (COZAAR) 25  MG tablet Take 1 tablet (25 mg total) 2 (two) times daily by mouth. 180 tablet 3  . Magnesium 500 MG TABS Take 1 tablet by mouth daily.     . Multiple Vitamin (MULTIVITAMIN) tablet Take 1 tablet by mouth daily.    Marland Kitchen NITROSTAT 0.4 MG SL tablet Place 1 tablet under the tongue every 5 (five) minutes x 3 doses as needed for chest pain.    . TOPROL XL 25 MG 24 hr tablet TAKE ONE-HALF (1/2) TABLET DAILY, HOLD MEDICATION IF SYSTOLIC BLOOD PRESSURE IS LESS THAN 100 (TOP NUMBER) 45 tablet 2   No current facility-administered medications for this visit.     Allergies:   Patient has no known allergies.    ROS:  Please see the history of present illness.   Otherwise, review of  systems are positive for none.   All other systems are reviewed and negative.    PHYSICAL EXAM: VS:  BP 120/74   Pulse (!) 55   Ht 5\' 7"  (1.702 m)   Wt 166 lb 12.8 oz (75.7 kg)   LMP  (LMP Unknown)   SpO2 96%   BMI 26.12 kg/m  , BMI Body mass index is 26.12 kg/m.  GENERAL:  Well appearing NECK:  No jugular venous distention, waveform within normal limits, carotid upstroke brisk and symmetric, no bruits, no thyromegaly LUNGS:  Clear to auscultation bilaterally CHEST:   Healed ICD scar.   HEART:  PMI not displaced or sustained,S1 and S2 within normal limits, no S3, no S4, no clicks, no rubs, soft brief apical systolic murmur left sternal border murmur, no diastolic murmurs ABD:  Flat, positive bowel sounds normal in frequency in pitch, no bruits, no rebound, no guarding, no midline pulsatile mass, no hepatomegaly, no splenomegaly EXT:  2 plus pulses throughout, no edema, no cyanosis no clubbing   EKG:  EKG is not  ordered today.   Recent Labs: 05/25/2017: NT-Pro BNP 316 06/08/2017: BUN 25; Creatinine, Ser 1.04; Potassium 4.7; Sodium 142    Lipid Panel No results found for: CHOL, TRIG, HDL, CHOLHDL, VLDL, LDLCALC, LDLDIRECT    Wt Readings from Last 3 Encounters:  09/28/17 166 lb 12.8 oz (75.7 kg)  06/08/17 164 lb (74.4 kg)  05/31/17 164 lb (74.4 kg)       Other studies Reviewed: Additional studies/ records that were reviewed today include: None Review of the above records demonstrates:      ASSESSMENT AND PLAN:   CAD -   The patient has no new sypmtoms.  No further cardiovascular testing is indicated.  We will continue with aggressive risk reduction and meds as listed.  Ischemic CM -    I am going to try to slightly titrate her Cozaar to 25 mg twice daily.  Probably her BP will not allow much in the way of med titration.   HL -   Lipid target is less than her age.  I will defer to Leanna Battles, MD  ICD -     She is  up to date with follow up.    Current  medicines are reviewed at length with the patient today.  The patient does not have concerns regarding medicines.  The following changes have been made:   As above Labs/ tests ordered today include:     No orders of the defined types were placed in this encounter.    Disposition:   FU with with me in Jan  Signed, Trasean Delima, MD  09/28/2017 10:37 AM  [facility]

## 2017-09-28 ENCOUNTER — Other Ambulatory Visit: Payer: Self-pay | Admitting: Acute Care

## 2017-09-28 ENCOUNTER — Encounter: Payer: Self-pay | Admitting: Cardiology

## 2017-09-28 ENCOUNTER — Ambulatory Visit (INDEPENDENT_AMBULATORY_CARE_PROVIDER_SITE_OTHER): Payer: Medicare Other | Admitting: Cardiology

## 2017-09-28 VITALS — BP 120/74 | HR 55 | Ht 67.0 in | Wt 166.8 lb

## 2017-09-28 DIAGNOSIS — Z87891 Personal history of nicotine dependence: Secondary | ICD-10-CM

## 2017-09-28 DIAGNOSIS — I2510 Atherosclerotic heart disease of native coronary artery without angina pectoris: Secondary | ICD-10-CM | POA: Diagnosis not present

## 2017-09-28 DIAGNOSIS — Z122 Encounter for screening for malignant neoplasm of respiratory organs: Secondary | ICD-10-CM

## 2017-09-28 DIAGNOSIS — I255 Ischemic cardiomyopathy: Secondary | ICD-10-CM | POA: Diagnosis not present

## 2017-09-28 DIAGNOSIS — E785 Hyperlipidemia, unspecified: Secondary | ICD-10-CM | POA: Diagnosis not present

## 2017-09-28 DIAGNOSIS — Z1231 Encounter for screening mammogram for malignant neoplasm of breast: Secondary | ICD-10-CM | POA: Diagnosis not present

## 2017-09-28 MED ORDER — LOSARTAN POTASSIUM 25 MG PO TABS: 25.0000 mg | ORAL_TABLET | Freq: Two times a day (BID) | ORAL | 3 refills | Status: DC

## 2017-09-28 NOTE — Patient Instructions (Signed)
Medication Instructions:  INCREASE- Losartan 25 mg twice a day  If you need a refill on your cardiac medications before your next appointment, please call your pharmacy.  Labwork: None Ordered   Testing/Procedures: None Ordered   Follow-Up: Your physician wants you to follow-up in: January.    Thank you for choosing [facility] at [facility]!!

## 2017-10-02 ENCOUNTER — Telehealth: Payer: Self-pay | Admitting: Cardiology

## 2017-10-02 ENCOUNTER — Ambulatory Visit (INDEPENDENT_AMBULATORY_CARE_PROVIDER_SITE_OTHER): Payer: Medicare Other | Admitting: *Deleted

## 2017-10-02 DIAGNOSIS — Z95810 Presence of automatic (implantable) cardiac defibrillator: Secondary | ICD-10-CM

## 2017-10-02 DIAGNOSIS — I5022 Chronic systolic (congestive) heart failure: Secondary | ICD-10-CM

## 2017-10-02 DIAGNOSIS — I255 Ischemic cardiomyopathy: Secondary | ICD-10-CM

## 2017-10-02 NOTE — Progress Notes (Signed)
Remote ICD transmission.   

## 2017-10-02 NOTE — Progress Notes (Addendum)
EPIC Encounter for ICM Monitoring  Patient Name: Hailey Wright is a 71 y.o. female Date: 10/02/2017 Primary Care Physican: Leanna Battles, MD Primary Cardiologist: Hochrein Electrophysiologist: Curt Bears Dry Weight:160lbs      Heart Failure questions reviewed, pt asymptomatic.   [place]: Thoracic impedance normal.  She did not have any symptoms during decreased impedance.  Prescribed dosage: Furosemide 40 mg 1 tablet daily as needed.   Labs: 06/08/2017 Creatinine 1.04, BUN 25, Potassium 4.7, Sodium 142, EGFR 55-63 05/25/2017 Creatinine 0.88, BUN 22, Potassium 4.6, Sodium 142, EGFR 67-78 12/15/2016 Creatinine 0.97, BUN 17, Potassium 5.0, Sodium 144, EGFR 57  Recommendations: No changes.    Encouraged to call for fluid symptoms.  Follow-up plan: ICM clinic phone appointment on 11/02/2017.    Copy of ICM check sent to Dr. Curt Bears.   3 month ICM trend: 10/02/2017    1 Year ICM trend:       [name], RN 10/02/2017 3:43 PM

## 2017-10-02 NOTE — Telephone Encounter (Signed)
Spoke with pt and reminded pt of remote transmission that is due today. Pt verbalized understanding.   

## 2017-10-04 LAB — CUP PACEART REMOTE DEVICE CHECK
Battery Remaining Percentage: 81 %
Battery Voltage: 2.98 V
Brady Statistic AP VP Percent: 1 %
Brady Statistic AS VP Percent: 1 %
Brady Statistic AS VS Percent: 75 %
Brady Statistic RV Percent Paced: 1 %
HighPow Impedance: 83 Ohm
HighPow Impedance: 83 Ohm
Implantable Lead Implant Date: 20170727
Implantable Lead Location: 753860
Implantable Pulse Generator Implant Date: 20170727
Lead Channel Impedance Value: 430 Ohm
Lead Channel Impedance Value: 440 Ohm
Lead Channel Pacing Threshold Amplitude: 0.5 V
Lead Channel Pacing Threshold Pulse Width: 0.5 ms
Lead Channel Pacing Threshold Pulse Width: 0.5 ms
Lead Channel Sensing Intrinsic Amplitude: 12 mV
Lead Channel Setting Pacing Amplitude: 2.5 V
MDC IDC LEAD IMPLANT DT: 20170727
MDC IDC LEAD LOCATION: 753859
MDC IDC MSMT BATTERY REMAINING LONGEVITY: 79 mo
MDC IDC MSMT LEADCHNL RA SENSING INTR AMPL: 5 mV
MDC IDC MSMT LEADCHNL RV PACING THRESHOLD AMPLITUDE: 1 V
MDC IDC PG SERIAL: 7261095
MDC IDC SESS DTM: 20181112162548
MDC IDC SET LEADCHNL RA PACING AMPLITUDE: 2 V
MDC IDC SET LEADCHNL RV PACING PULSEWIDTH: 0.5 ms
MDC IDC SET LEADCHNL RV SENSING SENSITIVITY: 0.5 mV
MDC IDC STAT BRADY AP VS PERCENT: 18 %
MDC IDC STAT BRADY RA PERCENT PACED: 5.9 %

## 2017-10-06 ENCOUNTER — Encounter: Payer: Self-pay | Admitting: Cardiology

## 2017-10-31 DIAGNOSIS — R7309 Other abnormal glucose: Secondary | ICD-10-CM | POA: Diagnosis not present

## 2017-10-31 DIAGNOSIS — E7849 Other hyperlipidemia: Secondary | ICD-10-CM | POA: Diagnosis not present

## 2017-10-31 DIAGNOSIS — I10 Essential (primary) hypertension: Secondary | ICD-10-CM | POA: Diagnosis not present

## 2017-10-31 DIAGNOSIS — R82998 Other abnormal findings in urine: Secondary | ICD-10-CM | POA: Diagnosis not present

## 2017-11-02 ENCOUNTER — Ambulatory Visit (INDEPENDENT_AMBULATORY_CARE_PROVIDER_SITE_OTHER): Payer: Medicare Other

## 2017-11-02 DIAGNOSIS — Z95810 Presence of automatic (implantable) cardiac defibrillator: Secondary | ICD-10-CM | POA: Diagnosis not present

## 2017-11-02 DIAGNOSIS — I5022 Chronic systolic (congestive) heart failure: Secondary | ICD-10-CM | POA: Diagnosis not present

## 2017-11-07 DIAGNOSIS — M25519 Pain in unspecified shoulder: Secondary | ICD-10-CM | POA: Diagnosis not present

## 2017-11-07 DIAGNOSIS — Z6826 Body mass index (BMI) 26.0-26.9, adult: Secondary | ICD-10-CM | POA: Diagnosis not present

## 2017-11-07 DIAGNOSIS — Z0000 Encounter for general adult medical examination without abnormal findings: Secondary | ICD-10-CM | POA: Diagnosis not present

## 2017-11-07 DIAGNOSIS — N816 Rectocele: Secondary | ICD-10-CM | POA: Diagnosis not present

## 2017-11-07 DIAGNOSIS — Z9861 Coronary angioplasty status: Secondary | ICD-10-CM | POA: Diagnosis not present

## 2017-11-07 DIAGNOSIS — E7849 Other hyperlipidemia: Secondary | ICD-10-CM | POA: Diagnosis not present

## 2017-11-07 DIAGNOSIS — J439 Emphysema, unspecified: Secondary | ICD-10-CM | POA: Diagnosis not present

## 2017-11-07 DIAGNOSIS — Z1389 Encounter for screening for other disorder: Secondary | ICD-10-CM | POA: Diagnosis not present

## 2017-11-07 DIAGNOSIS — R0600 Dyspnea, unspecified: Secondary | ICD-10-CM | POA: Diagnosis not present

## 2017-11-07 DIAGNOSIS — M533 Sacrococcygeal disorders, not elsewhere classified: Secondary | ICD-10-CM | POA: Diagnosis not present

## 2017-11-07 DIAGNOSIS — I10 Essential (primary) hypertension: Secondary | ICD-10-CM | POA: Diagnosis not present

## 2017-11-07 DIAGNOSIS — R7309 Other abnormal glucose: Secondary | ICD-10-CM | POA: Diagnosis not present

## 2017-11-07 NOTE — Progress Notes (Addendum)
EPIC Encounter for ICM Monitoring  Patient Name: Hailey Wright is a 71 y.o. female Date: 11/07/2017 Primary Care Physican: Leanna Battles, MD Primary Cardiologist: Hochrein Electrophysiologist: Curt Bears Dry Weight: Previous weight 160lbs          Transmission received.   Thoracic impedance normal.  Prescribed dosage: Furosemide 40 mg 1 tablet daily as needed.   Labs: 06/08/2017 Creatinine 1.04, BUN 25, Potassium 4.7, Sodium 142, EGFR 55-63 05/25/2017 Creatinine 0.88, BUN 22, Potassium 4.6, Sodium 142, EGFR 67-78 12/15/2016 Creatinine 0.97, BUN 17, Potassium 5.0, Sodium 144, EGFR 57  Recommendations: None.  Follow-up plan: ICM clinic phone appointment on 12/05/2017.    Copy of ICM check sent to Dr. Curt Bears.   3 month ICM trend: 11/02/2017    1 Year ICM trend:       [name], RN 11/07/2017 9:18 AM

## 2017-11-09 DIAGNOSIS — Z1212 Encounter for screening for malignant neoplasm of rectum: Secondary | ICD-10-CM | POA: Diagnosis not present

## 2017-11-16 ENCOUNTER — Ambulatory Visit
Admission: RE | Admit: 2017-11-16 | Discharge: 2017-11-16 | Disposition: A | Discharge status: Home / Self Care | Payer: Medicare Other | Source: Ambulatory Visit | Attending: Acute Care | Admitting: Acute Care

## 2017-11-16 DIAGNOSIS — Z122 Encounter for screening for malignant neoplasm of respiratory organs: Secondary | ICD-10-CM

## 2017-11-16 DIAGNOSIS — Z87891 Personal history of nicotine dependence: Secondary | ICD-10-CM | POA: Diagnosis not present

## 2017-11-20 DIAGNOSIS — M7541 Impingement syndrome of right shoulder: Secondary | ICD-10-CM | POA: Diagnosis not present

## 2017-11-21 HISTORY — PX: SHOULDER SURGERY: SHX246

## 2017-11-27 DIAGNOSIS — M7541 Impingement syndrome of right shoulder: Secondary | ICD-10-CM | POA: Diagnosis not present

## 2017-12-01 DIAGNOSIS — M7541 Impingement syndrome of right shoulder: Secondary | ICD-10-CM | POA: Diagnosis not present

## 2017-12-05 ENCOUNTER — Ambulatory Visit (INDEPENDENT_AMBULATORY_CARE_PROVIDER_SITE_OTHER): Payer: Medicare Other

## 2017-12-05 ENCOUNTER — Other Ambulatory Visit: Payer: Self-pay | Admitting: Acute Care

## 2017-12-05 DIAGNOSIS — M7541 Impingement syndrome of right shoulder: Secondary | ICD-10-CM | POA: Diagnosis not present

## 2017-12-05 DIAGNOSIS — Z87891 Personal history of nicotine dependence: Secondary | ICD-10-CM

## 2017-12-05 DIAGNOSIS — Z122 Encounter for screening for malignant neoplasm of respiratory organs: Secondary | ICD-10-CM

## 2017-12-05 DIAGNOSIS — I5022 Chronic systolic (congestive) heart failure: Secondary | ICD-10-CM

## 2017-12-05 DIAGNOSIS — Z95810 Presence of automatic (implantable) cardiac defibrillator: Secondary | ICD-10-CM

## 2017-12-05 NOTE — Progress Notes (Signed)
EPIC Encounter for ICM Monitoring  Patient Name: Hailey Wright is a 72 y.o. female Date: 12/05/2017 Primary Care Physican: Leanna Battles, MD Primary Cardiologist: Hochrein Electrophysiologist: Curt Bears Dry Weight: 167lbs      Heart Failure questions reviewed, pt symptomatic with weight gain of 3-4 lbs in the last 4 days.      Thoracic impedance abnormal suggesting fluid accumulation from starting 12/03/17 and also decreased impedance from 11/14/17 to 11/28/17.  Prescribed dosage: Furosemide 40 mg 1 tablet daily as needed. PCP has ordered patient to take Furosemide 40 mg 1 tablet every Tuesday and Thursday instead of PRN due to high potassium.  Potassium was 5.2 that was taken by Dr Shon Baton office.    Labs:  Labs to be drawn by Dr Shon Baton office week of 12/11/17 to recheck Potassium  06/08/2017 Creatinine 1.04, BUN 25, Potassium 4.7, Sodium 142, EGFR 55-63 05/25/2017 Creatinine 0.88, BUN 22, Potassium 4.6, Sodium 142, EGFR 67-78 12/15/2016 Creatinine 0.97, BUN 17, Potassium 5.0, Sodium 144, EGFR 57  Recommendations:  Advised to take Furosemide 40 mg 1 tablet for next 2 days.  Education given on foods high in Potassium and any seasoning blends that are salt free contain high amount of Potassium.   Follow-up plan: ICM clinic phone appointment on 12/08/2017 (manual send).  Office appointment scheduled 12/21/2017 with Dr. Percival Spanish.  Copy of ICM check sent to Dr. Curt Bears and Dr. Percival Spanish for review.   3 month ICM trend: 12/05/2017    1 Year ICM trend:       [name], RN 12/05/2017 8:08 AM

## 2017-12-07 DIAGNOSIS — M7541 Impingement syndrome of right shoulder: Secondary | ICD-10-CM | POA: Diagnosis not present

## 2017-12-08 ENCOUNTER — Ambulatory Visit (INDEPENDENT_AMBULATORY_CARE_PROVIDER_SITE_OTHER): Payer: Self-pay

## 2017-12-08 DIAGNOSIS — I5022 Chronic systolic (congestive) heart failure: Secondary | ICD-10-CM

## 2017-12-08 DIAGNOSIS — Z95810 Presence of automatic (implantable) cardiac defibrillator: Secondary | ICD-10-CM

## 2017-12-08 NOTE — Progress Notes (Addendum)
EPIC Encounter for ICM Monitoring  Patient Name: Hailey Wright is a 72 y.o. female Date: 12/08/2017 Primary Care Physican: Leanna Battles, MD Primary Cardiologist: Hochrein Electrophysiologist: Curt Bears Dry Weight: 165lbs              Heart Failure questions reviewed, pt asymptomatic.   Thoracic impedance returned to normal after taking 2 consecutive days of Furosemide 40 mg.    Prescribed dosage: Furosemide 40 mg 1 tablet daily as needed. PCP has ordered patient to take Furosemide 40 mg 1 tablet every Tuesday and Thursday instead of PRN due to high potassium.  Potassium was 5.2 that was taken by Dr Shon Baton office.    Labs:  Labs to be drawn by Dr Shon Baton office week of 12/11/17 to recheck Potassium  06/08/2017 Creatinine 1.04, BUN 25, Potassium 4.7, Sodium 142, EGFR 55-63 05/25/2017 Creatinine 0.88, BUN 22, Potassium 4.6, Sodium 142, EGFR 67-78 12/15/2016 Creatinine 0.97, BUN 17, Potassium 5.0, Sodium 144, EGFR 57  Recommendations:  She has resumed Furosemide every Tuesday and Thursday as instructed by PCP until labs on 12/11/2016.    Follow-up plan: ICM clinic phone appointment on 01/08/2018.   Office appointment scheduled 12/21/2017 with Dr. Percival Spanish.  Copy of ICM check sent to Dr. Curt Bears and Dr. Percival Spanish.   3 month ICM trend: 12/08/2017    1 Year ICM trend:       [name], RN 12/08/2017 2:49 PM

## 2017-12-14 DIAGNOSIS — I10 Essential (primary) hypertension: Secondary | ICD-10-CM | POA: Diagnosis not present

## 2017-12-14 DIAGNOSIS — M7541 Impingement syndrome of right shoulder: Secondary | ICD-10-CM | POA: Diagnosis not present

## 2017-12-18 DIAGNOSIS — M7541 Impingement syndrome of right shoulder: Secondary | ICD-10-CM | POA: Diagnosis not present

## 2017-12-19 NOTE — Progress Notes (Signed)
Cardiology Office Note   Date:  12/21/2017   ID:  Hailey Wright, DOB [birth year], MRN [id number]  PCP:  Leanna Battles, MD  Cardiologist:   Minus Breeding, MD   Chief Complaint  Patient presents with  . Coronary Artery Disease     History of Present Illness: Hailey Wright is a 72 y.o. female who presents for follow of CAD and ischemic cardiomyopathy.  She has a history of known coronary disease as previously described. She had a negative stress perfusion study in the fall of 2016.  However, she came back with symptoms of acute onset chest pain. The plan was an elective cardiac catheterization. However, prior to that presented with an acute anterior myocardial infarction. She had occlusion of the LAD with acute thrombus in the circumflex. She did have chronic occlusion of her right coronary artery. She was placed on intra-aortic balloon pump and had emergent CABG with a LIMA to the LAD and SVG to OM1. She is left with an ejection fraction of approximately 30%. In July of last year she had an ICD placed.   Her EF improved on echo in July 2018 to 35%.     Since I last saw her she has done really well.  He did have a lipid profile and her LDL is at 87.  Her potassium was slightly elevated.  She was short of breath previously and I gave her some Lasix but a follow-up BMP was normal.  She was told this might of been related to emphysema.  She is no longer short of breath.  She is not having any palpitations, presyncope or syncope.  She denies any chest pressure, neck or arm discomfort.  Past Medical History:  Diagnosis Date  . Acute ST elevation myocardial infarction (STEMI) involving left anterior descending coronary artery ([place]) 02/18/2016  . AICD (automatic cardioverter/defibrillator) present   . CAD (coronary artery disease) 06/04/2013  . Coronary artery thrombosis ([place]) 02/18/2016  . DVT (deep venous thrombosis) ([place]) 1980s   RLE  . Hepatitis A infection 1987  . Hyperlipidemia   . Ischemic  cardiomyopathy    a. Echo 5/17: EF 25-30%, anteroseptal, anterior, anteroseptal and apical akinesis, possible inferior hypokinesis, grade 1 diastolic dysfunction, no evidence of thrombus, trivial AI, mild MR, mild LAE  . MI (myocardial infarction) ([place]) 1991   Inferior treated with TPA 1991, POBA RCA 1996  . Pneumonia 1980s X 1  . RAS (renal artery stenosis) ([facility])    Right renal stent 2005  . S/P Emergency CABG x 2 02/18/2016   LIMA to LAD, SVG to OM1, EVH via right thigh  . Type II diabetes mellitus ([place])     Past Surgical History:  Procedure Laterality Date  . CARDIAC CATHETERIZATION N/A 02/18/2016   Procedure: Left Heart Cath and Coronary Angiography;  Surgeon: Leonie Man, MD;  Location: [facility];  Service: Cardiovascular;  Laterality: N/A;  . CORONARY ANGIOPLASTY  1991  . CORONARY ARTERY BYPASS GRAFT N/A 02/18/2016   Procedure: CORONARY ARTERY BYPASS GRAFTING (CABG) times 2 using left internal mammary and right greater saphenous vein;  Surgeon: Rexene Alberts, MD;  Location: [facility];  Service: Open Heart Surgery;  Laterality: N/A;  . EP IMPLANTABLE DEVICE N/A 06/16/2016   Procedure: ICD Implant;  Surgeon: Will Meredith Leeds, MD;  Location: [facility];  Service: Cardiovascular;  Laterality: N/A;  . RENAL ARTERY STENT Right 2005  . RHINOPLASTY Bilateral 1968  . TONSILLECTOMY AND ADENOIDECTOMY  1950s  Current Outpatient Medications  Medication Sig Dispense Refill  . aspirin 81 MG tablet Take 81 mg by mouth daily.    . calcium-vitamin D (OSCAL WITH D) 500-200 MG-UNIT per tablet Take 1 tablet by mouth 2 (two) times daily.    . carboxymethylcellulose (REFRESH PLUS) 0.5 % SOLN Place 0.5 drops into both eyes 4 (four) times daily.    Marland Kitchen Co-Enzyme Q10 200 MG CAPS Take 1 capsule by mouth daily.     Marland Kitchen ezetimibe (ZETIA) 10 MG tablet Take 10 mg by mouth daily.    . furosemide (LASIX) 40 MG tablet Take 1 tablet (40 mg total) by mouth as needed. 90 tablet 3  . losartan  (COZAAR) 25 MG tablet Take 1 tablet (25 mg total) by mouth 3 (three) times daily. 270 tablet 3  . Magnesium 500 MG TABS Take 1 tablet by mouth daily.     . Multiple Vitamin (MULTIVITAMIN) tablet Take 1 tablet by mouth daily.    Marland Kitchen NITROSTAT 0.4 MG SL tablet Place 1 tablet under the tongue every 5 (five) minutes x 3 doses as needed for chest pain.    . TOPROL XL 25 MG 24 hr tablet TAKE ONE-HALF (1/2) TABLET DAILY, HOLD MEDICATION IF SYSTOLIC BLOOD PRESSURE IS LESS THAN 100 (TOP NUMBER) 45 tablet 2  . rosuvastatin (CRESTOR) 40 MG tablet Take 1 tablet (40 mg total) by mouth daily. 90 tablet 3   No current facility-administered medications for this visit.     Allergies:   Patient has no known allergies.    ROS:  Please see the history of present illness.   Otherwise, review of systems are positive for none.   All other systems are reviewed and negative.    PHYSICAL EXAM: VS:  BP 122/73   Pulse 63   Ht 5\' 6"  (1.676 m)   Wt 169 lb (76.7 kg)   LMP  (LMP Unknown)   BMI 27.28 kg/m  , BMI Body mass index is 27.28 kg/m.  GENERAL:  Well appearing NECK:  No jugular venous distention, waveform within normal limits, carotid upstroke brisk and symmetric, no bruits, no thyromegaly LUNGS:  Clear to auscultation bilaterally CHEST:  ICD scar well healed.   HEART:  PMI not displaced or sustained,S1 and S2 within normal limits, no S3, no S4, no clicks, no rubs, no murmurs ABD:  Flat, positive bowel sounds normal in frequency in pitch, no bruits, no rebound, no guarding, no midline pulsatile mass, no hepatomegaly, no splenomegaly EXT:  2 plus pulses throughout, no edema, no cyanosis no clubbing   EKG:  EKG is not ordered today.   Recent Labs: 05/25/2017: NT-Pro BNP 316 06/08/2017: BUN 25; Creatinine, Ser 1.04; Potassium 4.7; Sodium 142    Lipid Panel No results found for: CHOL, TRIG, HDL, CHOLHDL, VLDL, LDLCALC, LDLDIRECT    Wt Readings from Last 3 Encounters:  12/21/17 169 lb (76.7 kg)    09/28/17 166 lb 12.8 oz (75.7 kg)  06/08/17 164 lb (74.4 kg)       Other studies Reviewed: Additional studies/ records that were reviewed today include: Labs Review of the above records demonstrates:   See elsewhere   ASSESSMENT AND PLAN:   CAD -   The patient has no new sypmtoms.  No further cardiovascular testing is indicated.  We will continue with aggressive risk reduction and meds as listed.  Ischemic CM -      I am going to try to slightly titrate her Cozaar again to 75 mg daily  HL -    Lipids are not at target.  I am going to change her from Lipitor to Crestor 40 mg daily.  ICD -       She is up-to-date with follow-up.  Current medicines are reviewed at length with the patient today.  The patient does not have concerns regarding medicines.  The following changes have been made:   As above Labs/ tests ordered today include:     No orders of the defined types were placed in this encounter.    Disposition:   FU with with me in one month.   Signed, Minus Breeding, MD  12/21/2017 9:19 AM    [facility]

## 2017-12-20 DIAGNOSIS — M7541 Impingement syndrome of right shoulder: Secondary | ICD-10-CM | POA: Diagnosis not present

## 2017-12-21 ENCOUNTER — Encounter: Payer: Self-pay | Admitting: Cardiology

## 2017-12-21 ENCOUNTER — Ambulatory Visit (INDEPENDENT_AMBULATORY_CARE_PROVIDER_SITE_OTHER): Payer: Medicare Other | Admitting: Cardiology

## 2017-12-21 VITALS — BP 122/73 | HR 63 | Ht 66.0 in | Wt 169.0 lb

## 2017-12-21 DIAGNOSIS — I2510 Atherosclerotic heart disease of native coronary artery without angina pectoris: Secondary | ICD-10-CM | POA: Diagnosis not present

## 2017-12-21 DIAGNOSIS — I5022 Chronic systolic (congestive) heart failure: Secondary | ICD-10-CM

## 2017-12-21 DIAGNOSIS — E785 Hyperlipidemia, unspecified: Secondary | ICD-10-CM

## 2017-12-21 MED ORDER — LOSARTAN POTASSIUM 25 MG PO TABS: 25.0000 mg | ORAL_TABLET | Freq: Three times a day (TID) | ORAL | 3 refills | Status: DC

## 2017-12-21 MED ORDER — ROSUVASTATIN CALCIUM 40 MG PO TABS: 40.0000 mg | ORAL_TABLET | Freq: Every day | ORAL | 3 refills | Status: DC

## 2017-12-21 NOTE — Patient Instructions (Addendum)
Medication Instructions:  STOP- Atorvastatin(Lipitor) START- Crestor 40 mg daily INCREASE- Losartan 25 mg three times a day  If you need a refill on your cardiac medications before your next appointment, please call your pharmacy.  Labwork: Getting fasting Lipids done at PCP office    Testing/Procedures: None Ordered  Follow-Up: Your physician wants you to follow-up in: 1 Month.    Thank you for choosing [facility] at [facility]!!

## 2017-12-25 DIAGNOSIS — M7541 Impingement syndrome of right shoulder: Secondary | ICD-10-CM | POA: Diagnosis not present

## 2018-01-03 DIAGNOSIS — R159 Full incontinence of feces: Secondary | ICD-10-CM | POA: Diagnosis not present

## 2018-01-03 DIAGNOSIS — N8181 Perineocele: Secondary | ICD-10-CM | POA: Diagnosis not present

## 2018-01-03 DIAGNOSIS — N952 Postmenopausal atrophic vaginitis: Secondary | ICD-10-CM | POA: Diagnosis not present

## 2018-01-03 DIAGNOSIS — K5902 Outlet dysfunction constipation: Secondary | ICD-10-CM | POA: Diagnosis not present

## 2018-01-03 DIAGNOSIS — N816 Rectocele: Secondary | ICD-10-CM | POA: Diagnosis not present

## 2018-01-03 DIAGNOSIS — N393 Stress incontinence (female) (male): Secondary | ICD-10-CM | POA: Diagnosis not present

## 2018-01-08 ENCOUNTER — Ambulatory Visit (INDEPENDENT_AMBULATORY_CARE_PROVIDER_SITE_OTHER): Payer: Medicare Other | Admitting: *Deleted

## 2018-01-08 DIAGNOSIS — I255 Ischemic cardiomyopathy: Secondary | ICD-10-CM

## 2018-01-08 DIAGNOSIS — I5022 Chronic systolic (congestive) heart failure: Secondary | ICD-10-CM

## 2018-01-08 DIAGNOSIS — Z95810 Presence of automatic (implantable) cardiac defibrillator: Secondary | ICD-10-CM

## 2018-01-08 DIAGNOSIS — M7541 Impingement syndrome of right shoulder: Secondary | ICD-10-CM | POA: Diagnosis not present

## 2018-01-08 NOTE — Progress Notes (Signed)
EPIC Encounter for ICM Monitoring  Patient Name: Hailey Wright is a 72 y.o. female Date: 01/08/2018 Primary Care Physican: Leanna Battles, MD Primary Cardiologist: Hochrein Electrophysiologist: Curt Bears Dry Weight: 165lbs      Heart Failure questions reviewed, pt asymptomatic.   Thoracic impedance normal.  Prescribed dosage: Furosemide 40 mg 1 tablet daily as needed.    Labs: 06/08/2017 Creatinine 1.04, BUN 25, Potassium 4.7, Sodium 142, EGFR 55-63 05/25/2017 Creatinine 0.88, BUN 22, Potassium 4.6, Sodium 142, EGFR 67-78 12/15/2016 Creatinine 0.97, BUN 17, Potassium 5.0, Sodium 144, EGFR 57  Recommendations: No changes.   Encouraged to call for fluid symptoms.  Follow-up plan: ICM clinic phone appointment on 02/08/2018.  Office appointment scheduled 01/22/2018 with Dr. Percival Spanish.  Copy of ICM check sent to Dr. Curt Bears.   3 month ICM trend: 01/08/2018    1 Year ICM trend:       [name], RN 01/08/2018 12:53 PM

## 2018-01-08 NOTE — Progress Notes (Signed)
Remote ICD transmission.   

## 2018-01-09 ENCOUNTER — Other Ambulatory Visit: Payer: Self-pay | Admitting: Cardiology

## 2018-01-09 ENCOUNTER — Telehealth: Payer: Self-pay | Admitting: *Deleted

## 2018-01-09 LAB — CUP PACEART REMOTE DEVICE CHECK
Brady Statistic AP VS Percent: 11 %
Brady Statistic AS VP Percent: 1 %
Date Time Interrogation Session: 20190218070032
HighPow Impedance: 70 Ohm
HighPow Impedance: 70 Ohm
Implantable Lead Location: 753859
Implantable Lead Location: 753860
Lead Channel Impedance Value: 390 Ohm
Lead Channel Pacing Threshold Amplitude: 0.5 V
Lead Channel Pacing Threshold Pulse Width: 0.5 ms
Lead Channel Sensing Intrinsic Amplitude: 12 mV
Lead Channel Sensing Intrinsic Amplitude: 4.8 mV
Lead Channel Setting Pacing Amplitude: 2 V
MDC IDC LEAD IMPLANT DT: 20170727
MDC IDC LEAD IMPLANT DT: 20170727
MDC IDC MSMT BATTERY REMAINING LONGEVITY: 76 mo
MDC IDC MSMT BATTERY REMAINING PERCENTAGE: 78 %
MDC IDC MSMT BATTERY VOLTAGE: 2.98 V
MDC IDC MSMT LEADCHNL RV IMPEDANCE VALUE: 410 Ohm
MDC IDC MSMT LEADCHNL RV PACING THRESHOLD AMPLITUDE: 1 V
MDC IDC MSMT LEADCHNL RV PACING THRESHOLD PULSEWIDTH: 0.5 ms
MDC IDC PG IMPLANT DT: 20170727
MDC IDC SET LEADCHNL RV PACING AMPLITUDE: 2.5 V
MDC IDC SET LEADCHNL RV PACING PULSEWIDTH: 0.5 ms
MDC IDC SET LEADCHNL RV SENSING SENSITIVITY: 0.5 mV
MDC IDC STAT BRADY AP VP PERCENT: 1 %
MDC IDC STAT BRADY AS VS PERCENT: 85 %
MDC IDC STAT BRADY RA PERCENT PACED: 3.8 %
MDC IDC STAT BRADY RV PERCENT PACED: 1 %
Pulse Gen Serial Number: 7261095

## 2018-01-09 NOTE — Telephone Encounter (Signed)
   [facility] Pre-operative Risk Assessment    Request for surgical clearance:  1. What type of surgery is being performed? Right shoulder: right SA, SAD, DCR, SA-DCR   2. When is this surgery scheduled? Pending    3. What type of clearance is required (medical clearance vs. Pharmacy clearance to hold med vs. Both)? medical  4. Are there any medications that need to be held prior to surgery and how long?ASA   5. Practice name and name of physician performing surgery? [facility] Dr. Onnie Graham   6. What is your office phone and fax number? [phone]   7. Anesthesia type (None, local, MAC, general) ? General with intra scalene block   Hailey Wright Hailey Wright 01/09/2018, 1:27 PM  _________________________________________________________________   (provider comments below)

## 2018-01-09 NOTE — Telephone Encounter (Signed)
Rx(s) sent to pharmacy electronically.  

## 2018-01-10 ENCOUNTER — Encounter: Payer: Self-pay | Admitting: Cardiology

## 2018-01-20 NOTE — Progress Notes (Addendum)
Cardiology Office Note   Date:  01/22/2018   ID:  Shaunice Levitan, DOB 1946-09-07, MRN [id number]  PCP:  Leanna Battles, MD  Cardiologist:   Minus Breeding, MD   Chief Complaint  Patient presents with  . Coronary Artery Disease     History of Present Illness: Hailey Wright is a 72 y.o. female who presents for follow of CAD and ischemic cardiomyopathy.  She has a history of known coronary disease as previously described. She had a negative stress perfusion study in the fall of 2016.  However, she came back with symptoms of acute onset chest pain. The plan was an elective cardiac catheterization. However, prior to that presented with an acute anterior myocardial infarction. She had occlusion of the LAD with acute thrombus in the circumflex. She did have chronic occlusion of her right coronary artery. She was placed on intra-aortic balloon pump and had emergent CABG with a LIMA to the LAD and SVG to OM1. She is left with an ejection fraction of approximately 30%. She had an ICD placed.   Her EF improved on echo in July 2018 to 35%.     At the last visit I increased her Cozaar.   She is to have shoulder surgery.  She is had some trouble with a higher potassium it was suggested by her primary provider that she reduce to 50 mg of Cozaar but she has yet to do this.  She is felt well.  The patient denies any new symptoms such as chest discomfort, neck or arm discomfort. There has been no new shortness of breath, PND or orthopnea. There have been no reported palpitations, presyncope or syncope.  Past Medical History:  Diagnosis Date  . Acute ST elevation myocardial infarction (STEMI) involving left anterior descending coronary artery ([place]) 02/18/2016  . AICD (automatic cardioverter/defibrillator) present   . CAD (coronary artery disease) 06/04/2013  . Coronary artery thrombosis ([place]) 02/18/2016  . DVT (deep venous thrombosis) ([place]) 1980s   RLE  . Hepatitis A infection 1987  . Hyperlipidemia   .  Ischemic cardiomyopathy    a. Echo 5/17: EF 25-30%, anteroseptal, anterior, anteroseptal and apical akinesis, possible inferior hypokinesis, grade 1 diastolic dysfunction, no evidence of thrombus, trivial AI, mild MR, mild LAE  . MI (myocardial infarction) ([place]) 1991   Inferior treated with TPA 1991, POBA RCA 1996  . Pneumonia 1980s X 1  . RAS (renal artery stenosis) ([facility])    Right renal stent 2005  . S/P Emergency CABG x 2 02/18/2016   LIMA to LAD, SVG to OM1, EVH via right thigh  . Type II diabetes mellitus ([place])     Past Surgical History:  Procedure Laterality Date  . CARDIAC CATHETERIZATION N/A 02/18/2016   Procedure: Left Heart Cath and Coronary Angiography;  Surgeon: Leonie Man, MD;  Location: [place] [facility];  Service: Cardiovascular;  Laterality: N/A;  . CORONARY ANGIOPLASTY  1991  . CORONARY ARTERY BYPASS GRAFT N/A 02/18/2016   Procedure: CORONARY ARTERY BYPASS GRAFTING (CABG) times 2 using left internal mammary and right greater saphenous vein;  Surgeon: Rexene Alberts, MD;  Location: [facility];  Service: Open Heart Surgery;  Laterality: N/A;  . EP IMPLANTABLE DEVICE N/A 06/16/2016   Procedure: ICD Implant;  Surgeon: Will Meredith Leeds, MD;  Location: [facility];  Service: Cardiovascular;  Laterality: N/A;  . RENAL ARTERY STENT Right 2005  . RHINOPLASTY Bilateral 1968  . TONSILLECTOMY AND ADENOIDECTOMY  1950s     Current  Outpatient Medications  Medication Sig Dispense Refill  . aspirin 81 MG tablet Take 81 mg by mouth daily.    . calcium-vitamin D (OSCAL WITH D) 500-200 MG-UNIT per tablet Take 1 tablet by mouth 2 (two) times daily.    . carboxymethylcellulose (REFRESH PLUS) 0.5 % SOLN Place 0.5 drops into both eyes 4 (four) times daily.    Marland Kitchen Co-Enzyme Q10 200 MG CAPS Take 1 capsule by mouth daily.     Marland Kitchen ezetimibe (ZETIA) 10 MG tablet Take 10 mg by mouth daily.    Marland Kitchen losartan (COZAAR) 25 MG tablet Take 1 tablet (25 mg total) by mouth 3 (three) times daily. 270  tablet 3  . Magnesium 500 MG TABS Take 1 tablet by mouth daily.     . metoprolol succinate (TOPROL-XL) 25 MG 24 hr tablet TAKE ONE-HALF (1/2) TABLET DAILY. HOLD MEDICATION IF SYSTOLIC BLOOD PRESSURE IS LESS THAN 100 (TOP NUMBER) 45 tablet 3  . Multiple Vitamin (MULTIVITAMIN) tablet Take 1 tablet by mouth daily.    Marland Kitchen NITROSTAT 0.4 MG SL tablet Place 1 tablet under the tongue every 5 (five) minutes x 3 doses as needed for chest pain.    . rosuvastatin (CRESTOR) 40 MG tablet Take 1 tablet (40 mg total) by mouth daily. 90 tablet 3  . furosemide (LASIX) 40 MG tablet Take 1 tablet (40 mg total) by mouth as needed. 90 tablet 3   No current facility-administered medications for this visit.     Allergies:   Patient has no known allergies.    ROS:  Please see the history of present illness.   Otherwise, review of systems are positive for none.   All other systems are reviewed and negative.    PHYSICAL EXAM: VS:  BP 99/62   Pulse 61   Ht 5\' 6"  (1.676 m)   Wt 175 lb 6.4 oz (79.6 kg)   LMP  (LMP Unknown)   BMI 28.31 kg/m  , BMI Body mass index is 28.31 kg/m.  GENERAL:  Well appearing NECK:  No jugular venous distention, waveform within normal limits, carotid upstroke brisk and symmetric, no bruits, no thyromegaly LUNGS:  Clear to auscultation bilaterally CHEST:  Well healed sternotomy scar, ICD HEART:  PMI not displaced or sustained,S1 and S2 within normal limits, no S3, no S4, no clicks, no rubs, no murmurs ABD:  Flat, positive bowel sounds normal in frequency in pitch, no bruits, no rebound, no guarding, no midline pulsatile mass, no hepatomegaly, no splenomegaly EXT:  2 plus pulses throughout, no edema, no cyanosis no clubbing   EKG:  EKG is not ordered today.   Recent Labs: 05/25/2017: NT-Pro BNP 316 06/08/2017: BUN 25; Creatinine, Ser 1.04; Potassium 4.7; Sodium 142    Lipid Panel No results found for: CHOL, TRIG, HDL, CHOLHDL, VLDL, LDLCALC, LDLDIRECT    Wt Readings from Last 3  Encounters:  01/22/18 175 lb 6.4 oz (79.6 kg)  12/21/17 169 lb (76.7 kg)  09/28/17 166 lb 12.8 oz (75.7 kg)       Other studies Reviewed: Additional studies/ records that were reviewed today include: BMET Review of the above records demonstrates:     ASSESSMENT AND PLAN:   CAD -   The patient has no new sypmtoms.  No further cardiovascular testing is indicated.  We will continue with aggressive risk reduction and meds as listed.  Ischemic CM -       I am going to check the potassium again today and likely leave her on  the current dose of Cozaar unless it is elevated which point I would reduce it.  Her meds will not allow up titration otherwise.  I am going to check an echocardiogram in July and see her back after that.   HL -       She is to get repeat lipids in a few weeks at her primary provider office.  I increase her to Cozaar recently.   ICD -       She is up to date with follow up.   PREOP -      She is to have shoulder surgery.  According to ACC/AHA guidelines she would be acceptable risk for the planned procedure.  Current medicines are reviewed at length with the patient today.  The patient does not have concerns regarding medicines.  The following changes have been made:   None Labs/ tests ordered today include:      Orders Placed This Encounter  Procedures  . Basic Metabolic Panel (BMET)  . ECHOCARDIOGRAM COMPLETE     Disposition:   FU with with me in July after the echo.   Signed, Minus Breeding, MD  01/22/2018 10:25 AM    [facility]

## 2018-01-22 ENCOUNTER — Ambulatory Visit (INDEPENDENT_AMBULATORY_CARE_PROVIDER_SITE_OTHER): Payer: Medicare Other | Admitting: Cardiology

## 2018-01-22 ENCOUNTER — Encounter: Payer: Self-pay | Admitting: Cardiology

## 2018-01-22 VITALS — BP 99/62 | HR 61 | Ht 66.0 in | Wt 175.4 lb

## 2018-01-22 DIAGNOSIS — E785 Hyperlipidemia, unspecified: Secondary | ICD-10-CM | POA: Diagnosis not present

## 2018-01-22 DIAGNOSIS — I255 Ischemic cardiomyopathy: Secondary | ICD-10-CM

## 2018-01-22 DIAGNOSIS — I25810 Atherosclerosis of coronary artery bypass graft(s) without angina pectoris: Secondary | ICD-10-CM

## 2018-01-22 DIAGNOSIS — Z79899 Other long term (current) drug therapy: Secondary | ICD-10-CM | POA: Diagnosis not present

## 2018-01-22 LAB — BASIC METABOLIC PANEL
BUN/Creatinine Ratio: 17 (ref 12–28)
BUN: 16 mg/dL (ref 8–27)
CO2: 24 mmol/L (ref 20–29)
CREATININE: 0.93 mg/dL (ref 0.57–1.00)
Calcium: 9.7 mg/dL (ref 8.7–10.3)
Chloride: 105 mmol/L (ref 96–106)
GFR calc Af Amer: 72 mL/min/{1.73_m2} (ref >59)
GFR, EST NON AFRICAN AMERICAN: 62 mL/min/{1.73_m2} (ref >59)
GLUCOSE: 91 mg/dL (ref 65–99)
Potassium: 4.5 mmol/L (ref 3.5–5.2)
Sodium: 143 mmol/L (ref 134–144)

## 2018-01-22 NOTE — Patient Instructions (Signed)
Medication Instructions:  Continue current medications  If you need a refill on your cardiac medications before your next appointment, please call your pharmacy.  Labwork: BMP Today HERE IN OUR OFFICE AT [facility]  Take the provided lab slips for you to take with you to the lab for you blood draw.   You will NOT need to fast   Testing/Procedures: Your physician has requested that you have an echocardiogram in July. Echocardiography is a painless test that uses sound waves to create images of your heart. It provides your doctor with information about the size and shape of your heart and how well your heart's chambers and valves are working. This procedure takes approximately one hour. There are no restrictions for this procedure.   Follow-Up: Your physician wants you to follow-up in: July after Echo.   Thank you for choosing [facility] at [facility]!!

## 2018-02-08 ENCOUNTER — Ambulatory Visit (INDEPENDENT_AMBULATORY_CARE_PROVIDER_SITE_OTHER): Payer: Medicare Other

## 2018-02-08 DIAGNOSIS — Z95810 Presence of automatic (implantable) cardiac defibrillator: Secondary | ICD-10-CM

## 2018-02-08 DIAGNOSIS — I5022 Chronic systolic (congestive) heart failure: Secondary | ICD-10-CM

## 2018-02-08 NOTE — Progress Notes (Signed)
EPIC Encounter for ICM Monitoring  Patient Name: Hailey Wright is a 72 y.o. female Date: 02/08/2018 Primary Care Physican: Leanna Battles, MD Primary Cardiologist: Hochrein Electrophysiologist: Curt Bears Dry Weight: Previous weight 165lbs        Attempted call to patient and unable to reach.  Left detailed message regarding transmission.  Transmission reviewed.    Thoracic impedance normal but was abnormal suggesting fluid accumulation from 01/20/2018 through 01/23/2018 and 01/29/2018 through 02/06/2018.  Prescribed dosage: Furosemide 40 mg 1 tablet daily as needed.    Labs: 01/22/2018 Creatinine .093, BUN 16, Potassium 4.5, Sodium 143, EGFR 62-72 06/08/2017 Creatinine 1.04, BUN 25, Potassium 4.7, Sodium 142, EGFR 55-63 05/25/2017 Creatinine 0.88, BUN 22, Potassium 4.6, Sodium 142, EGFR 67-78 12/15/2016 Creatinine 0.97, BUN 17, Potassium 5.0, Sodium 144, EGFR 57  Recommendations: Left voice mail with ICM number and encouraged to call if experiencing any fluid symptoms.  Follow-up plan: ICM clinic phone appointment on 03/12/2018.    Copy of ICM check sent to Dr. Curt Bears.   3 month ICM trend: 02/09/2018    1 Year ICM trend:       [name], RN 02/08/2018 11:10 AM

## 2018-02-14 DIAGNOSIS — N952 Postmenopausal atrophic vaginitis: Secondary | ICD-10-CM | POA: Diagnosis not present

## 2018-02-14 DIAGNOSIS — K598 Other specified functional intestinal disorders: Secondary | ICD-10-CM | POA: Diagnosis not present

## 2018-02-14 DIAGNOSIS — N816 Rectocele: Secondary | ICD-10-CM | POA: Diagnosis not present

## 2018-02-14 DIAGNOSIS — N8181 Perineocele: Secondary | ICD-10-CM | POA: Diagnosis not present

## 2018-02-14 DIAGNOSIS — N393 Stress incontinence (female) (male): Secondary | ICD-10-CM | POA: Diagnosis not present

## 2018-02-15 ENCOUNTER — Telehealth: Payer: Self-pay | Admitting: Cardiology

## 2018-02-15 NOTE — Telephone Encounter (Signed)
New Message:       [facility] Pre-operative Risk Assessment    Request for surgical clearance:  1. What type of surgery is being performed? Repair a partially torn rotator cuff  2. When is this surgery scheduled? Not yet waiting for authorization  3. What type of clearance is required (medical clearance vs. Pharmacy clearance to hold med vs. Both)?Medical clearance  4. Are there any medications that need to be held prior to surgery and how long?No  5. Practice name and name of physician performing surgery? [facility]/Dr.Supple  6. What is your office phone and fax number? Pt doesn't know this information  7. Anesthesia type (None, local, MAC, general) ? Local  Pt also states that off of Dr. Onnie Graham has faxed over a pre-op fax as well. Pt has ICD and is a pt of dr. Curt Bears as well  Hailey Wright 02/15/2018, 4:13 PM  _________________________________________________________________   (provider comments below)

## 2018-02-19 ENCOUNTER — Telehealth: Payer: Self-pay

## 2018-02-19 NOTE — Telephone Encounter (Signed)
Received voice mail message from patient. She will be out of town on 03/12/2018 and would like to change the remote transmission date until after 03/18/2018.  Rescheduled ICM transmission for 03/19/2018.

## 2018-02-20 ENCOUNTER — Encounter: Payer: Self-pay | Admitting: Cardiology

## 2018-02-22 NOTE — Telephone Encounter (Signed)
walked medical clearance over to [facility]

## 2018-02-22 NOTE — Telephone Encounter (Signed)
See Dr Hochrein's office note from 01/22/18- pt cleared for shoulder surgery from a cardiac standpoint.   Kerin Ransom PA-C 02/22/2018 9:43 AM

## 2018-02-28 ENCOUNTER — Encounter: Payer: Self-pay | Admitting: Cardiology

## 2018-03-01 DIAGNOSIS — I10 Essential (primary) hypertension: Secondary | ICD-10-CM | POA: Diagnosis not present

## 2018-03-19 ENCOUNTER — Ambulatory Visit (INDEPENDENT_AMBULATORY_CARE_PROVIDER_SITE_OTHER): Payer: Medicare Other

## 2018-03-19 ENCOUNTER — Telehealth: Payer: Self-pay

## 2018-03-19 DIAGNOSIS — Z95810 Presence of automatic (implantable) cardiac defibrillator: Secondary | ICD-10-CM | POA: Diagnosis not present

## 2018-03-19 DIAGNOSIS — I5022 Chronic systolic (congestive) heart failure: Secondary | ICD-10-CM | POA: Diagnosis not present

## 2018-03-19 NOTE — Telephone Encounter (Signed)
LMOVM requesting that pt send manual transmission 

## 2018-03-20 ENCOUNTER — Encounter (HOSPITAL_COMMUNITY): Payer: Self-pay | Admitting: *Deleted

## 2018-03-20 NOTE — Progress Notes (Signed)
Call to patient.  Advised there is a chart note saying she was cleared for surgery from cardiac stand point by Kerin Ransom, PA for Dr Percival Spanish.  Advised her to call Dr Susie Cassette office tomorrow to check on clearance and if any further problems to call Dr Percival Spanish or Dr Curt Bears office.

## 2018-03-20 NOTE — Progress Notes (Signed)
Message return from Dr [employer] nurse regarding surgical clearance.  Received: Today  Message Contents  Stanton Kidney, RN  Charmel Pronovost Panda, RN        See note on 3/28 -- clearance is under there.  We have a pre op pool is does this. It does not come to the doctor directly anymore.  And, if there is no request on file, it is the patient's responsibility to get it sent to our office.   Let me know if you still need anything,  I did not call the patient,  Trinidad Curet, RN

## 2018-03-20 NOTE — Progress Notes (Addendum)
Patient denies chest pain or shortness of breath. Advised to stay on ASA by cardiologist. States her DM is controlled on diet. Requested that patient stop calcium with vitamin D, Co- Enzyme 10, Magnesium, and MVI. Device form faxed to cardiology clinic.

## 2018-03-20 NOTE — Progress Notes (Signed)
EPIC Encounter for ICM Monitoring  Patient Name: Hailey Wright is a 72 y.o. female Date: 03/20/2018 Primary Care Physican: Leanna Battles, MD Primary Cardiologist: Hochrein Electrophysiologist: Curt Bears Dry Weight:165lbs      Heart Failure questions reviewed, pt asymptomatic now but was symptomatic during decreased impedance while on vacation.  The took a couple of PRN Furosemide in the last couple of weeks.  Advised to increase fluids up to 64 oz due to report suggest she may be dehydrated.  She had rotator cuff surgery scheduled for 03/23/2018 but waiting on clearance from Dr Curt Bears office.  Message sent to Dr Curt Bears nurse for follow up.    Thoracic impedance abnormal suggesting dryness since 03/12/2018.  Prescribed dosage: Furosemide 40 mg 1 tablet daily as needed.    Labs: 01/22/2018 Creatinine .093, BUN 16, Potassium 4.5, Sodium 143, EGFR 62-72 06/08/2017 Creatinine 1.04, BUN 25, Potassium 4.7, Sodium 142, EGFR 55-63 05/25/2017 Creatinine 0.88, BUN 22, Potassium 4.6, Sodium 142, EGFR 67-78 12/15/2016 Creatinine 0.97, BUN 17, Potassium 5.0, Sodium 144, EGFR 57  Recommendations:  Advised to call if she has any fluid symptoms especially after outpatient surgery on 03/23/2018.   Follow-up plan: ICM clinic phone appointment on 04/19/2018.    Copy of ICM check sent to Dr. Curt Bears.   3 month ICM trend: 03/19/2018    1 Year ICM trend:       [name], RN 03/20/2018 10:14 AM

## 2018-03-21 NOTE — Progress Notes (Signed)
Anesthesia Chart Review: SAME DAY WORK-UP.  Case:  [id number] Date/Time:  03/22/18 1530   Procedure:  SHOULDER ARTHROSCOPY WITH SUBACROMIAL DECOMPRESSION AND DISTAL CLAVICLE EXCISION (Right )   Anesthesia type:  General   Pre-op diagnosis:  Right Shoulder Impingment, Acromioclavicular osteoarthritis   Location:  [facility] OR ROOM 06 / [place] [facility]   Surgeon:  Justice Britain, MD      DISCUSSION: Patient is a 72 year old female scheduled for the above procedure. History includes CAD/MI (inferior MI '91 s/p TPA, POBA RCA '96, anterior STEMI s/p emergency CABG 02/18/16: LIMA-LAD, SVG-OM1, EF 30%), ischemic cardiomyopathy, chronic systolic CHF, [employer]. Jude dual chamber ICD (06/16/16), former smoker (quit '12), HLD, RLE DVT '80's, DM2 (diet controlled), renal artery stenosis (s/p right renal artery stent '05), rhinoplasty.   Cardiologist Dr. Percival Spanish felt she was acceptable CV risk for surgery. She will need labs on arrival. ICD perioperative form is still pending from EP. Her ICD is located on the left side.   PROVIDERS: Leanna Battles, MD is PCP. Minus Breeding, MD is cardiologist. Last visit 01/22/18. He wrote, "She is to have shoulder surgery.  According to ACC/AHA guidelines she would be acceptable risk for the planned procedure." - Camnitz, Will, MD is EP cardiologist. ICM monitoring on 03/12/18 suggested dryness by thoracic impedance. Diuretic dosing reviewed and staff advised her to call if she had any fluid symptoms especially after her shoulder surgery. Her ICD perioperative device form is still pending. It looks like last interrogation may have been 10/02/17.   LABS: To be done on the day of surgery.   IMAGES: CT Chest lung cancer screen 11/06/17: IMPRESSION: Lung-RADS 2, benign appearance or behavior. Continue annual screening with low-dose chest CT without contrast in 12 months. Aortic Atherosclerosis (ICD10-I70.0) and Emphysema (ICD10-J43.9).  EKG: 05/25/17: Atrial paced rhythm with frequent PVCs.  Septal  infarct, age undetermined. T wave abnormality, consider inferolateral ischemia.    CV: Echo 06/05/17: Study Conclusions - Left ventricle: Systolic function was moderately reduced. The   estimated ejection fraction was in the range of 35% to 40%.   Severe hypokinesis of the mid-apicalanteroseptal and apical   myocardium. Akinesis of the basalinferior myocardium. - Aortic valve: Mildly to moderately calcified annulus. - Mitral valve: There was mild regurgitation. - Tricuspid valve: There was mild-moderate regurgitation. (Comparison EF 25-30% 05/20/16, 04/01/16)  PRE-CABG LHC 02/18/16:   Ost LM to LM lesion, 60% stenosed.  Ost LAD-1 lesion, 99% stenosed. Ost LAD-2 lesion, 70% stenosed. Mid LAD lesion, 40% stenosed. Dist LAD lesion, 40% stenosed.  Prox Cx lesion, 95% stenosed - prior to OM1. Mid Cx lesion, 70% stenosed just prior to OM 2 takeoff  Ost RCA to Dist RCA lesion, 100% stenosed. PDA and posterolateral branch are filled via left-to-right collaterals  There is severe left ventricular systolic dysfunction. Elevated LVEDP She was referred for emergent CABG and underwent LIMA-LAD, SVG-OM1 02/18/16 by Dr. Darylene Price.   Past Medical History:  Diagnosis Date  . Acute ST elevation myocardial infarction (STEMI) involving left anterior descending coronary artery ([place]) 02/18/2016  . AICD (automatic cardioverter/defibrillator) present   . CAD (coronary artery disease) 06/04/2013  . CHF (congestive heart failure) ([place])   . Coronary artery thrombosis ([place]) 02/18/2016  . DVT (deep venous thrombosis) ([place]) 1980s   RLE  . Hepatitis A infection 1987  . Hyperlipidemia   . Ischemic cardiomyopathy    a. Echo 5/17: EF 25-30%, anteroseptal, anterior, anteroseptal and apical akinesis, possible inferior hypokinesis, grade 1 diastolic dysfunction, no evidence  of thrombus, trivial AI, mild MR, mild LAE  . MI (myocardial infarction) ([place]) 1991   Inferior treated with TPA 1991, POBA RCA 1996  .  Pneumonia 1980s X 1  . RAS (renal artery stenosis) ([facility])    Right renal stent 2005  . S/P Emergency CABG x 2 02/18/2016   LIMA to LAD, SVG to OM1, EVH via right thigh  . Type II diabetes mellitus ([place])     Past Surgical History:  Procedure Laterality Date  . CARDIAC CATHETERIZATION N/A 02/18/2016   Procedure: Left Heart Cath and Coronary Angiography;  Surgeon: Leonie Man, MD;  Location: [facility];  Service: Cardiovascular;  Laterality: N/A;  . CORONARY ANGIOPLASTY  1991  . CORONARY ARTERY BYPASS GRAFT N/A 02/18/2016   Procedure: CORONARY ARTERY BYPASS GRAFTING (CABG) times 2 using left internal mammary and right greater saphenous vein;  Surgeon: Rexene Alberts, MD;  Location: [facility];  Service: Open Heart Surgery;  Laterality: N/A;  . EP IMPLANTABLE DEVICE N/A 06/16/2016   Procedure: ICD Implant;  Surgeon: Will Meredith Leeds, MD;  Location: [facility];  Service: Cardiovascular;  Laterality: N/A;  . RENAL ARTERY STENT Right 2005  . RHINOPLASTY Bilateral 1968  . TONSILLECTOMY AND ADENOIDECTOMY  1950s    MEDICATIONS: No current facility-administered medications for this encounter.    Marland Kitchen acetaminophen (TYLENOL) 500 MG tablet  . aspirin 81 MG tablet  . calcium-vitamin D (OSCAL WITH D) 500-200 MG-UNIT per tablet  . carboxymethylcellulose (REFRESH PLUS) 0.5 % SOLN  . Co-Enzyme Q10 200 MG CAPS  . ezetimibe (ZETIA) 10 MG tablet  . furosemide (LASIX) 40 MG tablet  . losartan (COZAAR) 25 MG tablet  . Magnesium 500 MG TABS  . metoprolol succinate (TOPROL-XL) 25 MG 24 hr tablet  . Multiple Vitamin (MULTIVITAMIN) tablet  . NITROSTAT 0.4 MG SL tablet  . rosuvastatin (CRESTOR) 40 MG tablet   Reported she was advised to stay on ASA per cardiology.  [facility] Short Stay Center/Anesthesiology Phone [phone] 03/21/2018 1:28 PM

## 2018-03-22 ENCOUNTER — Ambulatory Visit (HOSPITAL_COMMUNITY)
Admission: RE | Admit: 2018-03-22 | Discharge: 2018-03-22 | Disposition: A | Discharge status: Home / Self Care | Payer: Medicare Other | Source: Ambulatory Visit | Attending: Orthopedic Surgery | Admitting: Orthopedic Surgery

## 2018-03-22 ENCOUNTER — Encounter (HOSPITAL_COMMUNITY)
Admission: RE | Disposition: A | Discharge status: Home / Self Care | Payer: Self-pay | Source: Ambulatory Visit | Attending: Orthopedic Surgery

## 2018-03-22 ENCOUNTER — Encounter (HOSPITAL_COMMUNITY): Payer: Self-pay | Admitting: Urology

## 2018-03-22 ENCOUNTER — Ambulatory Visit (HOSPITAL_COMMUNITY): Payer: Medicare Other | Admitting: Vascular Surgery

## 2018-03-22 DIAGNOSIS — X58XXXA Exposure to other specified factors, initial encounter: Secondary | ICD-10-CM | POA: Insufficient documentation

## 2018-03-22 DIAGNOSIS — I252 Old myocardial infarction: Secondary | ICD-10-CM | POA: Insufficient documentation

## 2018-03-22 DIAGNOSIS — Z7984 Long term (current) use of oral hypoglycemic drugs: Secondary | ICD-10-CM | POA: Diagnosis not present

## 2018-03-22 DIAGNOSIS — Z951 Presence of aortocoronary bypass graft: Secondary | ICD-10-CM | POA: Diagnosis not present

## 2018-03-22 DIAGNOSIS — I2510 Atherosclerotic heart disease of native coronary artery without angina pectoris: Secondary | ICD-10-CM | POA: Insufficient documentation

## 2018-03-22 DIAGNOSIS — Z79899 Other long term (current) drug therapy: Secondary | ICD-10-CM | POA: Diagnosis not present

## 2018-03-22 DIAGNOSIS — Z87891 Personal history of nicotine dependence: Secondary | ICD-10-CM | POA: Insufficient documentation

## 2018-03-22 DIAGNOSIS — I509 Heart failure, unspecified: Secondary | ICD-10-CM | POA: Insufficient documentation

## 2018-03-22 DIAGNOSIS — M7541 Impingement syndrome of right shoulder: Secondary | ICD-10-CM | POA: Insufficient documentation

## 2018-03-22 DIAGNOSIS — S46291A Other injury of muscle, fascia and tendon of other parts of biceps, right arm, initial encounter: Secondary | ICD-10-CM | POA: Diagnosis not present

## 2018-03-22 DIAGNOSIS — M75121 Complete rotator cuff tear or rupture of right shoulder, not specified as traumatic: Secondary | ICD-10-CM | POA: Diagnosis not present

## 2018-03-22 DIAGNOSIS — S43401A Unspecified sprain of right shoulder joint, initial encounter: Secondary | ICD-10-CM | POA: Diagnosis not present

## 2018-03-22 DIAGNOSIS — Z95828 Presence of other vascular implants and grafts: Secondary | ICD-10-CM | POA: Diagnosis not present

## 2018-03-22 DIAGNOSIS — M19011 Primary osteoarthritis, right shoulder: Secondary | ICD-10-CM | POA: Insufficient documentation

## 2018-03-22 DIAGNOSIS — M659 Synovitis and tenosynovitis, unspecified: Secondary | ICD-10-CM | POA: Diagnosis not present

## 2018-03-22 DIAGNOSIS — G8918 Other acute postprocedural pain: Secondary | ICD-10-CM | POA: Diagnosis not present

## 2018-03-22 DIAGNOSIS — Z7982 Long term (current) use of aspirin: Secondary | ICD-10-CM | POA: Insufficient documentation

## 2018-03-22 DIAGNOSIS — S46211A Strain of muscle, fascia and tendon of other parts of biceps, right arm, initial encounter: Secondary | ICD-10-CM | POA: Diagnosis not present

## 2018-03-22 DIAGNOSIS — E1151 Type 2 diabetes mellitus with diabetic peripheral angiopathy without gangrene: Secondary | ICD-10-CM | POA: Insufficient documentation

## 2018-03-22 DIAGNOSIS — M7501 Adhesive capsulitis of right shoulder: Secondary | ICD-10-CM | POA: Diagnosis not present

## 2018-03-22 DIAGNOSIS — S46011D Strain of muscle(s) and tendon(s) of the rotator cuff of right shoulder, subsequent encounter: Secondary | ICD-10-CM

## 2018-03-22 DIAGNOSIS — M13811 Other specified arthritis, right shoulder: Secondary | ICD-10-CM | POA: Diagnosis not present

## 2018-03-22 HISTORY — DX: Heart failure, unspecified: I50.9

## 2018-03-22 LAB — BASIC METABOLIC PANEL
Anion gap: 6 (ref 5–15)
BUN: 18 mg/dL (ref 6–20)
CALCIUM: 8.7 mg/dL — AB (ref 8.9–10.3)
CHLORIDE: 109 mmol/L (ref 101–111)
CO2: 28 mmol/L (ref 22–32)
CREATININE: 0.87 mg/dL (ref 0.44–1.00)
GFR calc Af Amer: >60 mL/min (ref >60)
GFR calc non Af Amer: >60 mL/min (ref >60)
Glucose, Bld: 97 mg/dL (ref 65–99)
Potassium: 4 mmol/L (ref 3.5–5.1)
SODIUM: 143 mmol/L (ref 135–145)

## 2018-03-22 LAB — CBC
HCT: 41.1 % (ref 36.0–46.0)
Hemoglobin: 13.3 g/dL (ref 12.0–15.0)
MCH: 30 pg (ref 26.0–34.0)
MCHC: 32.4 g/dL (ref 30.0–36.0)
MCV: 92.6 fL (ref 78.0–100.0)
PLATELETS: 163 10*3/uL (ref 150–400)
RBC: 4.44 MIL/uL (ref 3.87–5.11)
RDW: 13.8 % (ref 11.5–15.5)
WBC: 6.3 10*3/uL (ref 4.0–10.5)

## 2018-03-22 LAB — HEMOGLOBIN A1C
Hgb A1c MFr Bld: 5.9 % — ABNORMAL HIGH (ref 4.8–5.6)
Mean Plasma Glucose: 122.63 mg/dL

## 2018-03-22 SURGERY — SHOULDER ARTHROSCOPY WITH SUBACROMIAL DECOMPRESSION AND DISTAL CLAVICLE EXCISION
Anesthesia: General | Site: Shoulder | Laterality: Right

## 2018-03-22 MED ORDER — PHENYLEPHRINE 40 MCG/ML (10ML) SYRINGE FOR IV PUSH (FOR BLOOD PRESSURE SUPPORT)
PREFILLED_SYRINGE | INTRAVENOUS | Status: AC
  Filled 2018-03-22: qty 10

## 2018-03-22 MED ORDER — DEXAMETHASONE SODIUM PHOSPHATE 10 MG/ML IJ SOLN
INTRAMUSCULAR | Status: DC | PRN
  Administered 2018-03-22: 10 mg via INTRAVENOUS

## 2018-03-22 MED ORDER — HYDROMORPHONE HCL 2 MG/ML IJ SOLN: 0.2500 mg | INTRAMUSCULAR | Status: DC | PRN

## 2018-03-22 MED ORDER — BUPIVACAINE LIPOSOME 1.3 % IJ SUSP
INTRAMUSCULAR | Status: DC | PRN
  Administered 2018-03-22: 10 mL via PERINEURAL

## 2018-03-22 MED ORDER — ONDANSETRON HCL 4 MG/2ML IJ SOLN
INTRAMUSCULAR | Status: DC | PRN
  Administered 2018-03-22: 4 mg via INTRAVENOUS

## 2018-03-22 MED ORDER — MIDAZOLAM HCL 2 MG/2ML IJ SOLN
2.0000 mg | Freq: Once | INTRAMUSCULAR | Status: AC
  Administered 2018-03-22: 2 mg via INTRAVENOUS

## 2018-03-22 MED ORDER — EPHEDRINE 5 MG/ML INJ
INTRAVENOUS | Status: AC
  Filled 2018-03-22: qty 10

## 2018-03-22 MED ORDER — PROPOFOL 10 MG/ML IV BOLUS
INTRAVENOUS | Status: DC | PRN
  Administered 2018-03-22: 120 mg via INTRAVENOUS

## 2018-03-22 MED ORDER — ONDANSETRON HCL 4 MG/2ML IJ SOLN
INTRAMUSCULAR | Status: AC
  Filled 2018-03-22: qty 4

## 2018-03-22 MED ORDER — METOCLOPRAMIDE HCL 5 MG/ML IJ SOLN: 10.0000 mg | Freq: Once | INTRAMUSCULAR | Status: DC | PRN

## 2018-03-22 MED ORDER — LIDOCAINE 2% (20 MG/ML) 5 ML SYRINGE
INTRAMUSCULAR | Status: AC
  Filled 2018-03-22: qty 5

## 2018-03-22 MED ORDER — CEFAZOLIN SODIUM-DEXTROSE 2-4 GM/100ML-% IV SOLN
2.0000 g | INTRAVENOUS | Status: AC
  Administered 2018-03-22: 2 g via INTRAVENOUS
  Filled 2018-03-22: qty 100

## 2018-03-22 MED ORDER — TIZANIDINE HCL 4 MG PO CAPS: 4.0000 mg | ORAL_CAPSULE | Freq: Three times a day (TID) | ORAL | 0 refills | Status: DC

## 2018-03-22 MED ORDER — ROCURONIUM BROMIDE 10 MG/ML (PF) SYRINGE
PREFILLED_SYRINGE | INTRAVENOUS | Status: AC
  Filled 2018-03-22: qty 10

## 2018-03-22 MED ORDER — SODIUM CHLORIDE 0.9 % IJ SOLN
INTRAMUSCULAR | Status: AC
  Filled 2018-03-22: qty 10

## 2018-03-22 MED ORDER — PHENYLEPHRINE HCL 10 MG/ML IJ SOLN
INTRAVENOUS | Status: DC | PRN
  Administered 2018-03-22: 25 ug/min via INTRAVENOUS

## 2018-03-22 MED ORDER — DEXAMETHASONE SODIUM PHOSPHATE 10 MG/ML IJ SOLN
INTRAMUSCULAR | Status: AC
  Filled 2018-03-22: qty 1

## 2018-03-22 MED ORDER — OXYCODONE-ACETAMINOPHEN 5-325 MG PO TABS: 1.0000 | ORAL_TABLET | ORAL | 0 refills | Status: DC | PRN

## 2018-03-22 MED ORDER — CHLORHEXIDINE GLUCONATE 4 % EX LIQD: 60.0000 mL | Freq: Once | CUTANEOUS | Status: DC

## 2018-03-22 MED ORDER — LIDOCAINE 2% (20 MG/ML) 5 ML SYRINGE
INTRAMUSCULAR | Status: AC
  Filled 2018-03-22: qty 10

## 2018-03-22 MED ORDER — ROCURONIUM BROMIDE 100 MG/10ML IV SOLN
INTRAVENOUS | Status: DC | PRN
  Administered 2018-03-22: 50 mg via INTRAVENOUS

## 2018-03-22 MED ORDER — LACTATED RINGERS IV SOLN
INTRAVENOUS | Status: DC
  Administered 2018-03-22 (×3): via INTRAVENOUS

## 2018-03-22 MED ORDER — SUGAMMADEX SODIUM 200 MG/2ML IV SOLN
INTRAVENOUS | Status: DC | PRN
  Administered 2018-03-22: 150 mg via INTRAVENOUS

## 2018-03-22 MED ORDER — FENTANYL CITRATE (PF) 100 MCG/2ML IJ SOLN
INTRAMUSCULAR | Status: AC
  Administered 2018-03-22: 50 ug via INTRAVENOUS
  Filled 2018-03-22: qty 2

## 2018-03-22 MED ORDER — PROPOFOL 10 MG/ML IV BOLUS
INTRAVENOUS | Status: AC
  Filled 2018-03-22: qty 20

## 2018-03-22 MED ORDER — PHENYLEPHRINE 40 MCG/ML (10ML) SYRINGE FOR IV PUSH (FOR BLOOD PRESSURE SUPPORT)
PREFILLED_SYRINGE | INTRAVENOUS | Status: DC | PRN
  Administered 2018-03-22: 40 ug via INTRAVENOUS

## 2018-03-22 MED ORDER — SODIUM CHLORIDE 0.9 % IR SOLN
Status: DC | PRN
  Administered 2018-03-22 (×2): 3000 mL

## 2018-03-22 MED ORDER — MIDAZOLAM HCL 2 MG/2ML IJ SOLN
INTRAMUSCULAR | Status: AC
  Administered 2018-03-22: 2 mg via INTRAVENOUS
  Filled 2018-03-22: qty 2

## 2018-03-22 MED ORDER — ONDANSETRON HCL 4 MG PO TABS: 4.0000 mg | ORAL_TABLET | Freq: Three times a day (TID) | ORAL | 0 refills | Status: DC | PRN

## 2018-03-22 MED ORDER — BUPIVACAINE HCL (PF) 0.5 % IJ SOLN
INTRAMUSCULAR | Status: DC | PRN
  Administered 2018-03-22: 15 mL via PERINEURAL

## 2018-03-22 MED ORDER — MEPERIDINE HCL 50 MG/ML IJ SOLN: 6.2500 mg | INTRAMUSCULAR | Status: DC | PRN

## 2018-03-22 MED ORDER — LIDOCAINE 2% (20 MG/ML) 5 ML SYRINGE
INTRAMUSCULAR | Status: DC | PRN
  Administered 2018-03-22: 60 mg via INTRAVENOUS

## 2018-03-22 MED ORDER — ROCURONIUM BROMIDE 10 MG/ML (PF) SYRINGE
PREFILLED_SYRINGE | INTRAVENOUS | Status: AC
  Filled 2018-03-22: qty 5

## 2018-03-22 MED ORDER — FENTANYL CITRATE (PF) 100 MCG/2ML IJ SOLN
50.0000 ug | Freq: Once | INTRAMUSCULAR | Status: AC
  Administered 2018-03-22: 50 ug via INTRAVENOUS

## 2018-03-22 SURGICAL SUPPLY — 57 items
ANCHOR PEEK 4.75X19.1 SWLK C (Anchor) ×10 IMPLANT
ANCHOR PUSHLOCK PEEK 3.5X19.5 (Anchor) ×2 IMPLANT
BLADE CUTTER GATOR 3.5 (BLADE) ×2 IMPLANT
BLADE GREAT WHITE 4.2 (BLADE) ×2 IMPLANT
BLADE SURG 11 STRL SS (BLADE) ×2 IMPLANT
BOOTCOVER CLEANROOM LRG (PROTECTIVE WEAR) ×4 IMPLANT
BUR OVAL 4.0 (BURR) ×2 IMPLANT
CANISTER SUCT LVC 12 LTR MEDI- (MISCELLANEOUS) ×2 IMPLANT
CANNULA ACUFLEX KIT 5X76 (CANNULA) ×2 IMPLANT
CANNULA DRILOCK 5.0X75 (CANNULA) ×2 IMPLANT
CANNULA TWIST IN 8.25X7CM (CANNULA) ×4 IMPLANT
CONNECTOR 5 IN 1 STRAIGHT STRL (MISCELLANEOUS) ×2 IMPLANT
DRAPE INCISE 23X17 IOBAN STRL (DRAPES)
DRAPE INCISE IOBAN 23X17 STRL (DRAPES) IMPLANT
DRAPE INCISE IOBAN 66X45 STRL (DRAPES) ×2 IMPLANT
DRAPE ORTHO SPLIT 77X108 STRL (DRAPES) ×2
DRAPE STERI 35X30 U-POUCH (DRAPES) IMPLANT
DRAPE SURG 17X11 SM STRL (DRAPES) ×2 IMPLANT
DRAPE SURG ORHT 6 SPLT 77X108 (DRAPES) ×2 IMPLANT
DRAPE U-SHAPE 47X51 STRL (DRAPES) IMPLANT
DRSG PAD ABDOMINAL 8X10 ST (GAUZE/BANDAGES/DRESSINGS) ×4 IMPLANT
DURAPREP 26ML APPLICATOR (WOUND CARE) ×2 IMPLANT
GAUZE SPONGE 4X4 12PLY STRL (GAUZE/BANDAGES/DRESSINGS) ×2 IMPLANT
GLOVE BIO SURGEON STRL SZ7.5 (GLOVE) ×2 IMPLANT
GLOVE BIO SURGEON STRL SZ8 (GLOVE) ×2 IMPLANT
GLOVE EUDERMIC 7 POWDERFREE (GLOVE) ×2 IMPLANT
GLOVE SS BIOGEL STRL SZ 7.5 (GLOVE) ×1 IMPLANT
GLOVE SUPERSENSE BIOGEL SZ 7.5 (GLOVE) ×1
GLOVE SURG SS PI 6.5 STRL IVOR (GLOVE) ×2 IMPLANT
GOWN STRL REUS W/ TWL LRG LVL3 (GOWN DISPOSABLE) ×1 IMPLANT
GOWN STRL REUS W/ TWL XL LVL3 (GOWN DISPOSABLE) ×2 IMPLANT
GOWN STRL REUS W/TWL LRG LVL3 (GOWN DISPOSABLE) ×1
GOWN STRL REUS W/TWL XL LVL3 (GOWN DISPOSABLE) ×2
KIT BASIN OR (CUSTOM PROCEDURE TRAY) ×2 IMPLANT
KIT SHOULDER TRACTION (DRAPES) ×2 IMPLANT
KIT TURNOVER KIT B (KITS) ×2 IMPLANT
MANIFOLD NEPTUNE II (INSTRUMENTS) ×2 IMPLANT
NEEDLE SCORPION MULTI FIRE (NEEDLE) ×2 IMPLANT
NEEDLE SPNL 18GX3.5 QUINCKE PK (NEEDLE) ×4 IMPLANT
NS IRRIG 1000ML POUR BTL (IV SOLUTION) ×2 IMPLANT
PACK SHOULDER (CUSTOM PROCEDURE TRAY) ×2 IMPLANT
PAD ARMBOARD 7.5X6 YLW CONV (MISCELLANEOUS) ×4 IMPLANT
SET ARTHROSCOPY TUBING (MISCELLANEOUS) ×1
SET ARTHROSCOPY TUBING LN (MISCELLANEOUS) ×1 IMPLANT
SLING ARM FOAM STRAP LRG (SOFTGOODS) IMPLANT
SLING ARM FOAM STRAP MED (SOFTGOODS) ×2 IMPLANT
SLING ULTRA II LG (MISCELLANEOUS) ×2 IMPLANT
SPONGE LAP 4X18 X RAY DECT (DISPOSABLE) IMPLANT
STRIP CLOSURE SKIN 1/2X4 (GAUZE/BANDAGES/DRESSINGS) ×2 IMPLANT
SUT MNCRL AB 3-0 PS2 18 (SUTURE) ×2 IMPLANT
SUT PDS AB 0 CT 36 (SUTURE) IMPLANT
SYR 20CC LL (SYRINGE) IMPLANT
TAPE PAPER 3X10 WHT MICROPORE (GAUZE/BANDAGES/DRESSINGS) ×2 IMPLANT
TOWEL OR 17X26 10 PK STRL BLUE (TOWEL DISPOSABLE) ×2 IMPLANT
TUBE CONNECTING 12X1/4 (SUCTIONS) ×2 IMPLANT
WAND SUCTION MAX 4MM 90S (SURGICAL WAND) ×2 IMPLANT
WATER STERILE IRR 1000ML POUR (IV SOLUTION) ×2 IMPLANT

## 2018-03-22 NOTE — H&P (Signed)
Hailey Wright    Chief Complaint: Right Shoulder Impingment, Acromioclavicular osteoarthritis HPI: The patient is a 72 y.o. female with chronic right shoulder impingement syndrome and symptoms refractory to prolonged attempts at conservative management.  Past Medical History:  Diagnosis Date  . Acute ST elevation myocardial infarction (STEMI) involving left anterior descending coronary artery ([place]) 02/18/2016  . AICD (automatic cardioverter/defibrillator) present   . CAD (coronary artery disease) 06/04/2013  . CHF (congestive heart failure) ([place])   . Coronary artery thrombosis ([place]) 02/18/2016  . DVT (deep venous thrombosis) ([place]) 1980s   RLE  . Hepatitis A infection 1987  . Hyperlipidemia   . Ischemic cardiomyopathy    a. Echo 5/17: EF 25-30%, anteroseptal, anterior, anteroseptal and apical akinesis, possible inferior hypokinesis, grade 1 diastolic dysfunction, no evidence of thrombus, trivial AI, mild MR, mild LAE  . MI (myocardial infarction) ([place]) 1991   Inferior treated with TPA 1991, POBA RCA 1996  . Pneumonia 1980s X 1  . RAS (renal artery stenosis) ([facility])    Right renal stent 2005  . S/P Emergency CABG x 2 02/18/2016   LIMA to LAD, SVG to OM1, EVH via right thigh  . Type II diabetes mellitus ([place])    pt denies    Past Surgical History:  Procedure Laterality Date  . CARDIAC CATHETERIZATION N/A 02/18/2016   Procedure: Left Heart Cath and Coronary Angiography;  Surgeon: Leonie Man, MD;  Location: [facility];  Service: Cardiovascular;  Laterality: N/A;  . CORONARY ANGIOPLASTY  1991  . CORONARY ARTERY BYPASS GRAFT N/A 02/18/2016   Procedure: CORONARY ARTERY BYPASS GRAFTING (CABG) times 2 using left internal mammary and right greater saphenous vein;  Surgeon: Rexene Alberts, MD;  Location: [facility];  Service: Open Heart Surgery;  Laterality: N/A;  . EP IMPLANTABLE DEVICE N/A 06/16/2016   Procedure: ICD Implant;  Surgeon: Will Meredith Leeds, MD;  Location: [facility];   Service: Cardiovascular;  Laterality: N/A;  . RENAL ARTERY STENT Right 2005  . RHINOPLASTY Bilateral 1968  . TONSILLECTOMY AND ADENOIDECTOMY  1950s    Family History  Problem Relation Age of Onset  . Heart attack Father 10       Fatal MI 25  . Heart attack Brother 22    Social History:  reports that she quit smoking about 6 years ago. Her smoking use included cigarettes. She has a 40.00 pack-year smoking history. She has never used smokeless tobacco. She reports that she does not drink alcohol or use drugs.   Medications Prior to Admission  Medication Sig Dispense Refill  . acetaminophen (TYLENOL) 500 MG tablet Take 1,000 mg by mouth every 6 (six) hours as needed for moderate pain or headache.    Marland Kitchen aspirin 81 MG tablet Take 81 mg by mouth daily.    . calcium-vitamin D (OSCAL WITH D) 500-200 MG-UNIT per tablet Take 1 tablet by mouth 2 (two) times daily.    . carboxymethylcellulose (REFRESH PLUS) 0.5 % SOLN Place 2 drops into both eyes daily as needed (for dry eyes).     Marland Kitchen Co-Enzyme Q10 200 MG CAPS Take 200 mg by mouth daily.     Marland Kitchen ezetimibe (ZETIA) 10 MG tablet Take 10 mg by mouth daily.    . furosemide (LASIX) 40 MG tablet Take 1 tablet (40 mg total) by mouth as needed. (Patient taking differently: Take 40 mg by mouth as needed for fluid. ) 90 tablet 3  . losartan (COZAAR) 25 MG tablet Take 1  tablet (25 mg total) by mouth 3 (three) times daily. 270 tablet 3  . Magnesium 500 MG TABS Take 500 mg by mouth daily.     . metoprolol succinate (TOPROL-XL) 25 MG 24 hr tablet TAKE ONE-HALF (1/2) TABLET DAILY. HOLD MEDICATION IF SYSTOLIC BLOOD PRESSURE IS LESS THAN 100 (TOP NUMBER) 45 tablet 3  . Multiple Vitamin (MULTIVITAMIN) tablet Take 1 tablet by mouth daily.    Marland Kitchen NITROSTAT 0.4 MG SL tablet Place 0.4 mg under the tongue every 5 (five) minutes as needed for chest pain.     . rosuvastatin (CRESTOR) 40 MG tablet Take 1 tablet (40 mg total) by mouth daily. 90 tablet 3     Physical Exam: right  shoulder with painful and restricted motion as noted at recent office visits  Vitals  Temp:  [98.4 F (36.9 C)] 98.4 F (36.9 C) (05/02 1221) Pulse Rate:  [51-55] 55 (05/02 1400) Resp:  [12-18] 15 (05/02 1400) BP: (97-131)/(37-49) 131/49 (05/02 1411) SpO2:  [94 %-100 %] 98 % (05/02 1400) Weight:  [74.8 kg (165 lb)] 74.8 kg (165 lb) (05/02 1221)  Assessment/Plan  Impression: Right Shoulder Impingment, Acromioclavicular osteoarthritis  Plan of Action: Procedure(s): SHOULDER ARTHROSCOPY WITH SUBACROMIAL DECOMPRESSION AND DISTAL CLAVICLE EXCISION  Hailey Wright 03/22/2018, 2:21 PM Contact # [phone]

## 2018-03-22 NOTE — Op Note (Signed)
03/22/2018  4:50 PM  PATIENT:   Hailey Wright  73 y.o. female  PRE-OPERATIVE DIAGNOSIS:  Right Shoulder Impingment, Acromioclavicular osteoarthritis  POST-OPERATIVE DIAGNOSIS:  Same with adhesive capsulitis, bicep tear, labral tear and rotator cuff tear  PROCEDURE:  RSA, mua, labral debridement, synovectomy, SAD, DCR, bicep tenodesis, RCR  SURGEON:  Tameya Kuznia, Metta Clines M.D.  ASSISTANTS: [place] pac   ANESTHESIA:   GET + ISB  EBL: min  SPECIMEN:  none  Drains: none   PATIENT DISPOSITION:  PACU - hemodynamically stable.    PLAN OF CARE: Discharge to home after PACU  Dictation# [id number]   Contact # [phone]

## 2018-03-22 NOTE — Anesthesia Procedure Notes (Signed)
Anesthesia Regional Block: Interscalene brachial plexus block   Pre-Anesthetic Checklist: ,, timeout performed, Correct Patient, Correct Site, Correct Laterality, Correct Procedure, Correct Position, site marked, Risks and benefits discussed,  Surgical consent,  Pre-op evaluation,  At surgeon's request and post-op pain management  Laterality: Right  Prep: chloraprep       Needles:  Injection technique: Single-shot  Needle Type: Echogenic Stimulator Needle     Needle Length: 9cm  Needle Gauge: 21   Needle insertion depth: 5 cm   Additional Needles:   Procedures:,,,, ultrasound used (permanent image in chart),,,,  Narrative:  Start time: 03/22/2018 1:48 PM End time: 03/22/2018 1:43 PM Injection made incrementally with aspirations every 5 mL.  Performed by: Personally  Anesthesiologist: Josephine Igo, MD  Additional Notes: Timeout performed. Patient sedated. Relevant anatomy ID'd using [place]. Incremental 2-34ml injection of LA with frequent aspiration. Patient tolerated procedure well.        Right Interscalene Block

## 2018-03-22 NOTE — Anesthesia Procedure Notes (Addendum)

## 2018-03-22 NOTE — Anesthesia Postprocedure Evaluation (Signed)
Anesthesia Post Note  Patient: Stefannie Defeo  Procedure(s) Performed: SHOULDER ARTHROSCOPY WITH SUBACROMIAL DECOMPRESSION AND DISTAL CLAVICLE EXCISION (Right Shoulder)     Patient location during evaluation: PACU Anesthesia Type: General and Regional Level of consciousness: awake and alert, oriented and patient cooperative Pain management: pain level controlled Vital Signs Assessment: post-procedure vital signs reviewed and stable Respiratory status: spontaneous breathing, nonlabored ventilation and respiratory function stable Cardiovascular status: blood pressure returned to baseline and stable Postop Assessment: no apparent nausea or vomiting Anesthetic complications: no    Last Vitals:  Vitals:   03/22/18 1706 03/22/18 1735  BP: (!) 112/53 (!) 124/55  Pulse: (!) 50 (!) 55  Resp: 13   Temp: (!) 36.3 C   SpO2: 100%     Last Pain:  Vitals:   03/22/18 1706  TempSrc:   PainSc: 0-No pain                 Shamel Galyean,E. Marzelle Rutten

## 2018-03-22 NOTE — Anesthesia Preprocedure Evaluation (Signed)
Anesthesia Evaluation  Patient identified by MRN, date of birth, ID band Patient awake    Reviewed: Allergy & Precautions, NPO status , Patient's Chart, lab work & pertinent test results, reviewed documented beta blocker date and time   Airway Mallampati: II  TM Distance: >3 FB Neck ROM: Full    Dental  (+) Teeth Intact   Pulmonary shortness of breath and with exertion, pneumonia, resolved, former smoker,    Pulmonary exam normal breath sounds clear to auscultation       Cardiovascular + CAD, + Past MI, + CABG, + Peripheral Vascular Disease and +CHF  + Cardiac Defibrillator  Rhythm:Irregular Rate:Bradycardia  2v CABG 2017, LIMA to LAD, SVG to OM1 STEMI 02/19/2016 AICD Ischemic Cardiomyopathy- improved Echo 06/05/2017- Left ventricle: Systolic function was moderately reduced. The  estimated ejection fraction was in the range of 35% to 40%.   Severe hypokinesis of the mid-apicalanteroseptal and apical  myocardium. Akinesis of the basalinferior myocardium. - Aortic valve: Mildly to moderately calcified annulus. - Mitral valve: There was mild regurgitation. - Tricuspid valve: There was mild-moderate regurgitation   Neuro/Psych negative neurological ROS  negative psych ROS   GI/Hepatic (+) Hepatitis -, A  Endo/Other  diabetes, Well Controlled, Type 2, Oral Hypoglycemic Agents  Renal/GU Renal InsufficiencyRenal disease  negative genitourinary   Musculoskeletal Impingement syndrome right shoulder   Abdominal   Peds  Hematology negative hematology ROS (+)   Anesthesia Other Findings   Reproductive/Obstetrics                             Anesthesia Physical Anesthesia Plan  ASA: III  Anesthesia Plan: General   Post-op Pain Management:  Regional for Post-op pain   Induction: Intravenous  PONV Risk Score and Plan: Dexamethasone, Ondansetron and Treatment may vary due to age or medical  condition  Airway Management Planned: Oral ETT and Natural Airway  Additional Equipment:   Intra-op Plan:   Post-operative Plan: Extubation in OR  Informed Consent: I have reviewed the patients History and Physical, chart, labs and discussed the procedure including the risks, benefits and alternatives for the proposed anesthesia with the patient or authorized representative who has indicated his/her understanding and acceptance.   Dental advisory given  Plan Discussed with: CRNA, Anesthesiologist and Surgeon  Anesthesia Plan Comments:         Anesthesia Quick Evaluation

## 2018-03-22 NOTE — Transfer of Care (Signed)
Immediate Anesthesia Transfer of Care Note  Patient: Hailey Wright  Procedure(s) Performed: SHOULDER ARTHROSCOPY WITH SUBACROMIAL DECOMPRESSION AND DISTAL CLAVICLE EXCISION (Right Shoulder)  Patient Location: PACU  Anesthesia Type:GA combined with regional for post-op pain  Level of Consciousness: awake, alert , oriented, drowsy and patient cooperative  Airway & Oxygen Therapy: Patient Spontanous Breathing and Patient connected to nasal cannula oxygen  Post-op Assessment: Report given to RN and Post -op Vital signs reviewed and stable  Post vital signs: Reviewed and stable  Last Vitals:  Vitals Value Taken Time  BP 112/53 03/22/2018  5:09 PM  Temp    Pulse 50 03/22/2018  5:11 PM  Resp 5 03/22/2018  5:11 PM  SpO2 100 % 03/22/2018  5:11 PM  Vitals shown include unvalidated device data.  Last Pain:  Vitals:   03/22/18 1229  TempSrc:   PainSc: 5       Patients Stated Pain Goal: 5 (74/16/38 [id number])  Complications: No apparent anesthesia complications

## 2018-03-22 NOTE — Discharge Instructions (Signed)
° °Kevin M. Supple, M.D., F.A.A.O.S. °Orthopaedic Surgery °Specializing in Arthroscopic and Reconstructive °Surgery of the Shoulder and Knee °[phone] °[address] - [place], [place] [address] - Fax [phone] ° °POST-OP SHOULDER ARTHROSCOPIC ROTATOR CUFF REPAIR INSTRUCTIONS ° °1. Call the office at [phone] to schedule your first post-op appointment 7-10 days from the date of your surgery. ° °2. Leave the steri-strips in place over your incisions when performing dressing changes and showering. You may remove your dressings and begin showering 72 hours from surgery. You can expect drainage that is clear to bloody in nature that occasionally will soak through your dressings. If this occurs go ahead and perform a dressing change. The drainage should lessen daily and when there is no drainage from your incisions feel free to go without a dressing. ° °3. Wear your sling/immobilizer at all times except to perform the exercises below or to occasionally let your arm dangle by your side to stretch your elbow. You also need to sleep in your sling immobilizer until instructed otherwise. ° °4. Range of motion to your elbow, wrist, and hand are encouraged 3-5 times daily. Exercise to your hand and fingers helps to reduce swelling you may experience. ° °5. Utilize ice to the shoulder 3-4 times minimum a day and additionally if you are experiencing pain. ° °6. You may one-armed drive when safely off of narcotics and muscle relaxants. You may use your hand that is in the sling to support the steering wheel only. However, should it be your right arm that is in the sling it is not to be used for gear shifting in a manual transmission. ° °7. If you had a block pre-operatively to provide post-op pain relief you may want to go ahead and begin utilizing your pain meds as your arm begins to wake up. Blocks can sometimes last up to 16-18 hours. If you are still pain-free prior to going to bed you may want to  strongly consider taking a pain medication to avoid being awakened in the night with the onset of pain. A muscle relaxant is also provided for you should you experience muscle spasms. It is recommended that if you are experiencing pain that your pain medication alone is not controlling, add the muscle relaxant along with the pain medication which can give additional pain relief. The first one to two days is generally the most severe of your pain and then should gradually decrease. As your pain lessens it is recommended that you decrease your use of the pain medications to an "as needed basis" only and to always comply with the recommended dosages of the pain medications. ° °8. Pain medications can produce constipation along with their use. If you experience this, the use of an over the counter stool softener or laxative daily is recommended.  ° °9. For additional questions or concerns, please do not hesitate to call the office. If after hours there is an answering service to forward your concerns to the physician on call. ° °10.Pain control following an exparel block ° °To help control your post-operative pain you received a nerve block  performed with Exparel which is a long acting anesthetic (numbing agent) which can provide pain relief and sensations of numbness (and relief of pain) in the operative shoulder and arm for up to 3 days. Sometimes it provides mixed relief, meaning you may still have numbness in certain areas of the arm but can still be able to move  parts of that arm, hand, and   fingers. We recommend that your prescribed pain medications  be used as needed. We do not feel it is necessary to "pre medicate" and "stay ahead" of pain.  Taking narcotic pain medications when you are not having any pain can lead to unnecessary and potentially dangerous side effects.  ° °POST-OP EXERCISES ° °Pendulum Exercises ° °Perform pendulum exercises while standing and bending at the waist. Support your uninvolved arm  on a table or chair and allow your operated arm to hang freely. Make sure to do these exercises passively - not using you shoulder muscle. ° °Repeat 20 times. Do 3 sessions per day. ° ° °

## 2018-03-22 NOTE — Progress Notes (Signed)
Resumed care of patient at 1750 from [name], RN.  Per previous RN, patient needed one size down in her shoulder immobilizer and this was supposed to be delivered from [employer] by [employer].  Made multiple phone calls to [employer], who responded to notify us that they no longer carry the product.  Spoke with [employer] at both [employer] and [employer], both of which indicated that they do not have the product either.  Since we were unable to locate a smaller immobilizer, patient was sent home with instructions to contact the office during normal business hours tomorrow for further instruction on obtaining the smaller immobilizer.

## 2018-03-23 NOTE — Op Note (Signed)
[name], Wright MEDICAL RECORD LX:[id number] ACCOUNT [id number] DATE OF BIRTH:04/30/1946 FACILITY: [facility] LOCATION: [facility] PHYSICIAN:Atiya Yera M. Fernie Grimm, MD  OPERATIVE REPORT  DATE OF PROCEDURE:  03/22/2018  PREOPERATIVE DIAGNOSES: 1.  Chronic right shoulder impingement syndrome. 2.  Right shoulder acromioclavicular joint arthropathy.  POSTOPERATIVE DIAGNOSES: 1.  Right shoulder adhesive capsulitis. 2.  Right shoulder impingement. 3.  Right shoulder acromioclavicular joint arthritis. 4.  Right shoulder biceps tendon tearing. 5.  Synovitis. 6.  Labral tear. 7.  Full-thickness rotator cuff tear.  PROCEDURE: 1.  Right shoulder examination under anesthesia. 2.  Right shoulder manipulation under anesthesia. 3.  Right shoulder glenohumeral joint diagnostic arthroscopy.  4.  Labral debridement. 5.  Extensive synovectomy. 6.  Arthroscopic subacromial decompression and bursectomy. 7.  Arthroscopic distal clavicle resection. 8.  Arthroscopic biceps tendon tenodesis. 9.  Arthroscopic rotator cuff repair using a double-row suture bridge repair construct.  SURGEON:  Justice Britain, MD  ASSISTANT:  Jenetta Loges, PA-C   ANESTHESIA:  General endotracheal as well as interscalene block.  ESTIMATED BLOOD LOSS:  Minimal.  DRAINS:  None.  HISTORY:  The patient is a 72 year old female who has had chronic progressive increasing right shoulder pain with increasing functional limitations.  Preoperative ultrasound showed evidence for what is read as a "partial" rotator cuff tear.  Due to her  ongoing pain and functional limitations, she is brought to the operating room at this time for planned right shoulder arthroscopy as described below.  We counseled the patient regarding treatment options as well as the potential risks versus benefits thereof.  Possible surgical complications were reviewed including potential for bleeding, infection, neurovascular injury, persistent pain, loss of  motion,  anesthetic complication, recurrence of rotator cuff tear and possible need for additional surgery.  She understands, accepts and agrees with our planned procedure.  PROCEDURE IN DETAIL:  After undergoing routine preoperative evaluation, the patient received prophylactic antibiotics.  An Exparel block was established in the holding area by the Anesthesia Department.  Placed supine on the operating table and underwent  smooth induction of a general endotracheal anesthesia.  Turned to the left lateral decubitus position on a beanbag and appropriately padded and protected.  Right shoulder examination under anesthesia revealed modest restriction of mobility,  approximately 140 degrees forward elevation.  Manipulation was performed with palpable and audible release of adhesions, achieving full motion.  Right arm was then suspended at 70 degrees of abduction with 10 pounds of traction and sterilely prepped and  draped in standard fashion.  A timeout was called.  A posterior portal was established in the glenohumeral joint.  Anterior portal was established under direct visualization.  There was diffuse synovitis noted with moderate capsule reduction, but  capsular volume reduction and evidence for capsular rupture from the manipulation.  Hemarthrosis was evacuated, and extensive synovectomy was performed.  There was circumferential labral degeneration, which was debrided with a shaver.  Viewed from the  intraarticular side, the biceps tendon proximally was unremarkable.  Rotator cuff showed what appeared initially to be some articular-sided fraying.  We completed the debridement within the glenohumeral joint.  Fluid and instruments were then removed.   The arm was then dropped down to 30 degrees of abduction.  Arthroscope was introduced in the subacromial space of the posterior portal, and a direct lateral portal was established in the subacromial space.  Abundant dense bursal tissue and multiple  adhesions were  encountered.  These were all divided and excised with a shaver and a Stryker wand.  The wand  was then used to remove the periosteum from the undersurface of the anterior half of the acromion, and then a subacromial decompression was  performed with a bur, creating a type 1 morphology.  A portal was then established directly anterior to the distal clavicle, and a distal clavicle resection was performed with a bur, creating a type 1 morphology.  The portal was then established directly  anterior to the distal clavicle, and distal clavicle resection was performed, removing approximately 8 mm of bone.  Care was taken to confirm visualization of the entire circumference of the distal clavicle to ensure adequate mobile bone.  We then  completed the subacromial-subdeltoid bursectomy, and visualizing from the bursal side, there was clearly a full-thickness tear of the supraspinatus, upper subscap and infraspinatus.  In addition, the biceps tendon traversed this area which was markedly  attenuated and frayed, and given these findings, we elected to proceed with both a biceps tenodesis as well as rotator cuff repair.  I grasped the long head biceps tendon.  It was amputated from its superior labral attachment and then passed a whipstitch  through the tendon at the apex of the bicipital groove using the scorpion suture passer and a #2 FiberWire.  This was then transfixed at the apex of the bicipital groove after we had prepared a bony bed using a bur.  This was transfixed with a 3.5  PushLock, and then the proximal segment was removed with a shaver.  The balance of the footprint on the tuberosity was then prepared with a bur, creating a bony bed.  Then we debrided the rotator cuff tendon back to healthy tissue, and it was also  mobilized, both articular and bursal sides.  We confirmed that it could be brought back over the footprint with good elasticity of the tissues.  I used a double-row repair, 2 medial and 2 lateral  anchors, both loaded with a FiberTape, and so a series of  8 suture limbs were passed through the margin of the rotator cuff tear with the medial-row anchors placed equidistant across the width of the footprint, which again was approximately 4 to 4.5 cm.  Once all suture limbs were then passed, we then placed 2  lateral-row anchors, crossing our suture limbs and allowing a double-row fixation which nicely compressed the free margin of the rotator cuff tendon against the _____   tuberosity.  Overall construct was much to our satisfaction.  At this time, suture  limbs were all then clipped.  Overall construct was much to our satisfaction.  Bursectomy was completed.  Hemostasis was obtained.  The fluid and instruments were removed.  Portals closed with Monocryl and Steri-Strips, a dry dressing about the right  shoulder.  The arm was placed into a sling, immobilized with a pillow.  The patient was then awakened, extubated, and taken to the recovery room in stable condition.  Jenetta Loges, PA-C, was used as an Environmental consultant throughout this case and essential for help with positioning the patient, positioning of extremity, tissue manipulation, manipulation of the extremity, suture management, wound closure and intraoperative  decision making.  LN/NUANCE  D:03/22/2018 T:03/22/2018 JOB:[id number]

## 2018-04-12 DIAGNOSIS — M25511 Pain in right shoulder: Secondary | ICD-10-CM | POA: Diagnosis not present

## 2018-04-18 DIAGNOSIS — M25511 Pain in right shoulder: Secondary | ICD-10-CM | POA: Diagnosis not present

## 2018-04-19 ENCOUNTER — Telehealth: Payer: Self-pay

## 2018-04-19 ENCOUNTER — Ambulatory Visit (INDEPENDENT_AMBULATORY_CARE_PROVIDER_SITE_OTHER): Payer: Medicare Other | Admitting: *Deleted

## 2018-04-19 DIAGNOSIS — Z95810 Presence of automatic (implantable) cardiac defibrillator: Secondary | ICD-10-CM | POA: Diagnosis not present

## 2018-04-19 DIAGNOSIS — I255 Ischemic cardiomyopathy: Secondary | ICD-10-CM

## 2018-04-19 DIAGNOSIS — I5022 Chronic systolic (congestive) heart failure: Secondary | ICD-10-CM | POA: Diagnosis not present

## 2018-04-19 NOTE — Telephone Encounter (Signed)
Spoke with pt and reminded pt of remote transmission that is due today. Pt verbalized understanding.   

## 2018-04-20 NOTE — Progress Notes (Signed)
Remote ICD transmission.   

## 2018-04-20 NOTE — Progress Notes (Signed)
EPIC Encounter for ICM Monitoring  Patient Name: Hailey Wright is a 72 y.o. female Date: 04/20/2018 Primary Care Physican: Leanna Battles, MD Primary Cardiologist: Hochrein Electrophysiologist: Curt Bears Dry Weight: Previous weight 165lbs       Heart Failure questions reviewed, pt symptomatic with swelling in fingers after shoulder surgery but no symptoms now.  [place] at time of next transmission in July so scheduled later in the month.   Thoracic impedance normal but was abnormal suggesting fluid accumulation after shoulder surgery.  She took a PRN Furosemide after discharge and returned to normal.   Prescribed dosage: Furosemide 40 mg 1 tablet daily as needed.    Labs: 01/22/2018 Creatinine .093, BUN 16, Potassium 4.5, Sodium 143, EGFR 62-72 06/08/2017 Creatinine 1.04, BUN 25, Potassium 4.7, Sodium 142, EGFR 55-63 05/25/2017 Creatinine 0.88, BUN 22, Potassium 4.6, Sodium 142, EGFR 67-78 12/15/2016 Creatinine 0.97, BUN 17, Potassium 5.0, Sodium 144, EGFR 57  Recommendations: No changes.   Encouraged to call for fluid symptoms.  Follow-up plan: ICM clinic phone appointment on 06/04/2018.  Advised to call Dr Macky Lower office for yearly appointment that is due in July.  Explained there is no reminder in the system so encouraged her to go ahead and call.  Copy of ICM check sent to Dr. Curt Bears.   3 month ICM trend: 04/19/2018    1 Year ICM trend:       [name], RN 04/20/2018 11:23 AM

## 2018-04-23 ENCOUNTER — Encounter: Payer: Self-pay | Admitting: Cardiology

## 2018-04-25 DIAGNOSIS — M25511 Pain in right shoulder: Secondary | ICD-10-CM | POA: Diagnosis not present

## 2018-05-02 DIAGNOSIS — M25511 Pain in right shoulder: Secondary | ICD-10-CM | POA: Diagnosis not present

## 2018-05-04 DIAGNOSIS — M25511 Pain in right shoulder: Secondary | ICD-10-CM | POA: Diagnosis not present

## 2018-05-09 DIAGNOSIS — M25511 Pain in right shoulder: Secondary | ICD-10-CM | POA: Diagnosis not present

## 2018-05-09 LAB — CUP PACEART REMOTE DEVICE CHECK
Battery Remaining Longevity: 74 mo
Battery Remaining Percentage: 76 %
Battery Voltage: 2.96 V
Brady Statistic AP VP Percent: 1 %
Brady Statistic AP VS Percent: 7.8 %
Brady Statistic AS VP Percent: 1 %
Brady Statistic AS VS Percent: 89 %
Brady Statistic RA Percent Paced: 2.9 %
Brady Statistic RV Percent Paced: 1 %
Date Time Interrogation Session: 20190530201453
HighPow Impedance: 81 Ohm
HighPow Impedance: 81 Ohm
Implantable Lead Implant Date: 20170727
Implantable Lead Implant Date: 20170727
Implantable Lead Location: 753859
Implantable Lead Location: 753860
Implantable Pulse Generator Implant Date: 20170727
Lead Channel Impedance Value: 410 Ohm
Lead Channel Impedance Value: 410 Ohm
Lead Channel Pacing Threshold Amplitude: 0.5 V
Lead Channel Pacing Threshold Amplitude: 1 V
Lead Channel Pacing Threshold Pulse Width: 0.5 ms
Lead Channel Pacing Threshold Pulse Width: 0.5 ms
Lead Channel Sensing Intrinsic Amplitude: 12 mV
Lead Channel Sensing Intrinsic Amplitude: 4.5 mV
Lead Channel Setting Pacing Amplitude: 2 V
Lead Channel Setting Pacing Amplitude: 2.5 V
Lead Channel Setting Pacing Pulse Width: 0.5 ms
Lead Channel Setting Sensing Sensitivity: 0.5 mV
Pulse Gen Serial Number: 7261095

## 2018-05-11 DIAGNOSIS — M25511 Pain in right shoulder: Secondary | ICD-10-CM | POA: Diagnosis not present

## 2018-05-14 DIAGNOSIS — M25511 Pain in right shoulder: Secondary | ICD-10-CM | POA: Diagnosis not present

## 2018-05-16 DIAGNOSIS — M25511 Pain in right shoulder: Secondary | ICD-10-CM | POA: Diagnosis not present

## 2018-05-29 DIAGNOSIS — M25511 Pain in right shoulder: Secondary | ICD-10-CM | POA: Diagnosis not present

## 2018-05-31 DIAGNOSIS — M25511 Pain in right shoulder: Secondary | ICD-10-CM | POA: Diagnosis not present

## 2018-06-04 ENCOUNTER — Ambulatory Visit (INDEPENDENT_AMBULATORY_CARE_PROVIDER_SITE_OTHER): Payer: Medicare Other

## 2018-06-04 DIAGNOSIS — M25511 Pain in right shoulder: Secondary | ICD-10-CM | POA: Diagnosis not present

## 2018-06-04 DIAGNOSIS — I5022 Chronic systolic (congestive) heart failure: Secondary | ICD-10-CM

## 2018-06-04 DIAGNOSIS — Z95810 Presence of automatic (implantable) cardiac defibrillator: Secondary | ICD-10-CM

## 2018-06-04 NOTE — Progress Notes (Signed)
EPIC Encounter for ICM Monitoring  Patient Name: Hailey Wright is a 72 y.o. female Date: 06/04/2018 Primary Care Physican: Leanna Battles, MD Primary Cardiologist: Hochrein Electrophysiologist: Curt Bears Dry Weight: 170lbs       Heart Failure questions reviewed, pt asymptomatic.   Thoracic impedance normal.  Prescribed dosage: Furosemide 40 mg 1 tablet daily as needed.    Labs: 01/22/2018 Creatinine .093, BUN 16, Potassium 4.5, Sodium 143, EGFR 62-72 06/08/2017 Creatinine 1.04, BUN 25, Potassium 4.7, Sodium 142, EGFR 55-63 05/25/2017 Creatinine 0.88, BUN 22, Potassium 4.6, Sodium 142, EGFR 67-78 12/15/2016 Creatinine 0.97, BUN 17, Potassium 5.0, Sodium 144, EGFR 57  Recommendations: No changes.   Encouraged to call for fluid symptoms.  Follow-up plan: ICM clinic phone appointment on 07/30/2018.   Office appointment scheduled 06/12/2018 with Dr. Percival Spanish and 06/26/2018 with Dr Curt Bears..    Copy of ICM check sent to Dr. Curt Bears.   3 month ICM trend: 06/04/2018    1 Year ICM trend:       [name], RN 06/04/2018 10:37 AM

## 2018-06-05 DIAGNOSIS — I5022 Chronic systolic (congestive) heart failure: Secondary | ICD-10-CM | POA: Diagnosis not present

## 2018-06-05 DIAGNOSIS — Z95810 Presence of automatic (implantable) cardiac defibrillator: Secondary | ICD-10-CM

## 2018-06-06 ENCOUNTER — Other Ambulatory Visit: Payer: Self-pay

## 2018-06-06 ENCOUNTER — Ambulatory Visit (HOSPITAL_COMMUNITY): Payer: Medicare Other | Attending: Cardiology

## 2018-06-06 DIAGNOSIS — I255 Ischemic cardiomyopathy: Secondary | ICD-10-CM | POA: Diagnosis not present

## 2018-06-06 DIAGNOSIS — I361 Nonrheumatic tricuspid (valve) insufficiency: Secondary | ICD-10-CM | POA: Diagnosis not present

## 2018-06-07 DIAGNOSIS — M25511 Pain in right shoulder: Secondary | ICD-10-CM | POA: Diagnosis not present

## 2018-06-11 DIAGNOSIS — M25511 Pain in right shoulder: Secondary | ICD-10-CM | POA: Diagnosis not present

## 2018-06-11 NOTE — Progress Notes (Signed)
Cardiology Office Note   Date:  06/12/2018   ID:  Hailey Wright, DOB 05-17-1946, MRN [id number]  PCP:  Leanna Battles, MD  Cardiologist:   Minus Breeding, MD   Chief Complaint  Patient presents with  . Cardiomyopathy     History of Present Illness: Hailey Wright is a 72 y.o. female who presents for follow of CAD and ischemic cardiomyopathy.  She has a history of known coronary disease as previously described. She had a negative stress perfusion study in the fall of 2016.  However, she came back with symptoms of acute onset chest pain. The plan was an elective cardiac catheterization. However, prior to that presented with an acute anterior myocardial infarction. She had occlusion of the LAD with acute thrombus in the circumflex. She did have chronic occlusion of her right coronary artery. She was placed on intra-aortic balloon pump and had emergent CABG with a LIMA to the LAD and SVG to OM1. She is left with an ejection fraction of approximately 30%. She had an ICD placed.   Her EF improved on echo in July 2018 to 35%.   She presents for med titration.  I recently saw her before shoulder surgery.  Since that visit the EF was checked on echo and is unchanged at 35 - 40%.    She has been doing well since I last saw her.  She did some traveling recently.  The patient denies any new symptoms such as chest discomfort, neck or arm discomfort. There has been no new shortness of breath, PND or orthopnea. There have been no reported palpitations, presyncope or syncope.   Past Medical History:  Diagnosis Date  . Acute ST elevation myocardial infarction (STEMI) involving left anterior descending coronary artery ([facility]) 02/18/2016  . AICD (automatic cardioverter/defibrillator) present   . CAD (coronary artery disease) 06/04/2013  . CHF (congestive heart failure) ([facility])   . Coronary artery thrombosis ([facility]) 02/18/2016  . DVT (deep venous thrombosis) ([facility]) 1980s   RLE  . Hepatitis A infection 1987  .  Hyperlipidemia   . Ischemic cardiomyopathy    a. Echo 5/17: EF 25-30%, anteroseptal, anterior, anteroseptal and apical akinesis, possible inferior hypokinesis, grade 1 diastolic dysfunction, no evidence of thrombus, trivial AI, mild MR, mild LAE  . MI (myocardial infarction) ([facility]) 1991   Inferior treated with TPA 1991, POBA RCA 1996  . Pneumonia 1980s X 1  . RAS (renal artery stenosis) ([facility])    Right renal stent 2005  . S/P Emergency CABG x 2 02/18/2016   LIMA to LAD, SVG to OM1, EVH via right thigh  . Type II diabetes mellitus ([facility])    pt denies    Past Surgical History:  Procedure Laterality Date  . CARDIAC CATHETERIZATION N/A 02/18/2016   Procedure: Left Heart Cath and Coronary Angiography;  Surgeon: Leonie Man, MD;  Location: [facility];  Service: Cardiovascular;  Laterality: N/A;  . CORONARY ANGIOPLASTY  1991  . CORONARY ARTERY BYPASS GRAFT N/A 02/18/2016   Procedure: CORONARY ARTERY BYPASS GRAFTING (CABG) times 2 using left internal mammary and right greater saphenous vein;  Surgeon: Rexene Alberts, MD;  Location: [facility];  Service: Open Heart Surgery;  Laterality: N/A;  . EP IMPLANTABLE DEVICE N/A 06/16/2016   Procedure: ICD Implant;  Surgeon: Will Meredith Leeds, MD;  Location: [facility];  Service: Cardiovascular;  Laterality: N/A;  . RENAL ARTERY STENT Right 2005  . RHINOPLASTY Bilateral 1968  . TONSILLECTOMY AND ADENOIDECTOMY  1950s  Current Outpatient Medications  Medication Sig Dispense Refill  . aspirin 81 MG tablet Take 81 mg by mouth daily.    . calcium-vitamin D (OSCAL WITH D) 500-200 MG-UNIT per tablet Take 1 tablet by mouth 2 (two) times daily.    . carboxymethylcellulose (REFRESH PLUS) 0.5 % SOLN Place 2 drops into both eyes daily as needed (for dry eyes).     Marland Kitchen Co-Enzyme Q10 200 MG CAPS Take 200 mg by mouth daily.     Marland Kitchen ezetimibe (ZETIA) 10 MG tablet Take 10 mg by mouth daily.    . furosemide (LASIX) 40 MG tablet Take 1 tablet (40 mg total)  by mouth as needed. (Patient taking differently: Take 40 mg by mouth as needed for fluid. ) 90 tablet 3  . losartan (COZAAR) 25 MG tablet Take 1 tablet (25 mg total) by mouth 3 (three) times daily. 270 tablet 3  . Magnesium 500 MG TABS Take 500 mg by mouth daily.     . metoprolol succinate (TOPROL-XL) 25 MG 24 hr tablet TAKE ONE-HALF (1/2) TABLET DAILY. HOLD MEDICATION IF SYSTOLIC BLOOD PRESSURE IS LESS THAN 100 (TOP NUMBER) 45 tablet 3  . Multiple Vitamin (MULTIVITAMIN) tablet Take 1 tablet by mouth daily.    . rosuvastatin (CRESTOR) 40 MG tablet Take 1 tablet (40 mg total) by mouth daily. 90 tablet 3  . NITROSTAT 0.4 MG SL tablet Place 0.4 mg under the tongue every 5 (five) minutes as needed for chest pain.      No current facility-administered medications for this visit.     Allergies:   Patient has no known allergies.    ROS:  Please see the history of present illness.   Otherwise, review of systems are positive for none.   All other systems are reviewed and negative.    PHYSICAL EXAM: VS:  BP 124/60   Pulse 62   Ht 5\' 7"  (1.702 m)   Wt 171 lb (77.6 kg)   LMP  (LMP Unknown)   BMI 26.78 kg/m  , BMI Body mass index is 26.78 kg/m.  GENERAL:  Well appearing NECK:  No jugular venous distention, waveform within normal limits, carotid upstroke brisk and symmetric, no bruits, no thyromegaly LUNGS:  Clear to auscultation bilaterally CHEST:  Well healed sternal and ICD scars.   HEART:  PMI not displaced or sustained,S1 and S2 within normal limits, no S3, no S4, no clicks, no rubs, no murmurs ABD:  Flat, positive bowel sounds normal in frequency in pitch, no bruits, no rebound, no guarding, no midline pulsatile mass, no hepatomegaly, no splenomegaly EXT:  2 plus pulses throughout, no edema, no cyanosis no clubbing    EKG:  EKG is ordered today. Sinus rhythm, rate 62, axis within normal limits, intervals within normal limits, no acute ST-T wave changes.  Recent Labs: 03/22/2018: BUN 18;  Creatinine, Ser 0.87; Hemoglobin 13.3; Platelets 163; Potassium 4.0; Sodium 143    Lipid Panel No results found for: CHOL, TRIG, HDL, CHOLHDL, VLDL, LDLCALC, LDLDIRECT    Wt Readings from Last 3 Encounters:  06/12/18 171 lb (77.6 kg)  03/22/18 165 lb (74.8 kg)  01/22/18 175 lb 6.4 oz (79.6 kg)       Other studies Reviewed: Additional studies/ records that were reviewed today include: None Review of the above records demonstrates:     ASSESSMENT AND PLAN:   CAD -     The patient has no new sypmtoms.  No further cardiovascular testing is indicated.  We will continue  with aggressive risk reduction and meds as listed.  Ischemic CM -        She has not been taking the metoprolol every day as her blood pressure was running low.  However, its been higher recently so I am going to increase the metoprolol to every day and then if she tolerates increase to the entire pill every day.  The next step in my opinion would be switching her to I will see her in about 1 month to make this decision.Entresto.    HL -        She is at target.  Continue current meds.   ICD -      She is up to date with follow up.    Current medicines are reviewed at length with the patient today.  The patient does not have concerns regarding medicines.  The following changes have been made:   As above.  Labs/ tests ordered today include:   None   Orders Placed This Encounter  Procedures  . EKG 12-Lead     Disposition:   FU with with me in one month.   Signed, Minus Breeding, MD  06/12/2018 2:30 PM    [place]

## 2018-06-12 ENCOUNTER — Ambulatory Visit (INDEPENDENT_AMBULATORY_CARE_PROVIDER_SITE_OTHER): Payer: Medicare Other | Admitting: Cardiology

## 2018-06-12 ENCOUNTER — Encounter: Payer: Self-pay | Admitting: Cardiology

## 2018-06-12 VITALS — BP 124/60 | HR 62 | Ht 67.0 in | Wt 171.0 lb

## 2018-06-12 DIAGNOSIS — I255 Ischemic cardiomyopathy: Secondary | ICD-10-CM | POA: Diagnosis not present

## 2018-06-12 DIAGNOSIS — I2510 Atherosclerotic heart disease of native coronary artery without angina pectoris: Secondary | ICD-10-CM

## 2018-06-12 DIAGNOSIS — E785 Hyperlipidemia, unspecified: Secondary | ICD-10-CM | POA: Diagnosis not present

## 2018-06-12 NOTE — Patient Instructions (Signed)
Medication Instructions:  TAKE- Metoprolol 1/2 tablet daily then increase to 1 tablet daily  If you need a refill on your cardiac medications before your next appointment, please call your pharmacy.  Labwork: None Ordered   Testing/Procedures: None Ordered  Follow-Up: Your physician wants you to follow-up in: 1 Month.      Thank you for choosing [facility] at [facility]!!

## 2018-06-15 DIAGNOSIS — M25511 Pain in right shoulder: Secondary | ICD-10-CM | POA: Diagnosis not present

## 2018-06-19 DIAGNOSIS — M25511 Pain in right shoulder: Secondary | ICD-10-CM | POA: Diagnosis not present

## 2018-06-21 DIAGNOSIS — M25511 Pain in right shoulder: Secondary | ICD-10-CM | POA: Diagnosis not present

## 2018-06-26 ENCOUNTER — Ambulatory Visit (INDEPENDENT_AMBULATORY_CARE_PROVIDER_SITE_OTHER): Payer: Medicare Other | Admitting: Cardiology

## 2018-06-26 ENCOUNTER — Encounter: Payer: Self-pay | Admitting: Cardiology

## 2018-06-26 VITALS — BP 120/72 | HR 58 | Ht 66.75 in | Wt 174.2 lb

## 2018-06-26 DIAGNOSIS — E785 Hyperlipidemia, unspecified: Secondary | ICD-10-CM

## 2018-06-26 DIAGNOSIS — I5022 Chronic systolic (congestive) heart failure: Secondary | ICD-10-CM

## 2018-06-26 DIAGNOSIS — I255 Ischemic cardiomyopathy: Secondary | ICD-10-CM

## 2018-06-26 LAB — CUP PACEART INCLINIC DEVICE CHECK
HIGH POWER IMPEDANCE MEASURED VALUE: 78.75 Ohm
Implantable Lead Implant Date: 20170727
Lead Channel Impedance Value: 387.5 Ohm
Lead Channel Pacing Threshold Amplitude: 1 V
Lead Channel Pacing Threshold Amplitude: 1 V
Lead Channel Pacing Threshold Pulse Width: 0.5 ms
Lead Channel Pacing Threshold Pulse Width: 0.5 ms
Lead Channel Sensing Intrinsic Amplitude: 12 mV
Lead Channel Sensing Intrinsic Amplitude: 5 mV
Lead Channel Setting Pacing Amplitude: 2 V
Lead Channel Setting Pacing Pulse Width: 0.5 ms
MDC IDC LEAD IMPLANT DT: 20170727
MDC IDC LEAD LOCATION: 753859
MDC IDC LEAD LOCATION: 753860
MDC IDC MSMT BATTERY REMAINING LONGEVITY: 78 mo
MDC IDC MSMT LEADCHNL RA PACING THRESHOLD AMPLITUDE: 0.5 V
MDC IDC MSMT LEADCHNL RA PACING THRESHOLD AMPLITUDE: 0.5 V
MDC IDC MSMT LEADCHNL RA PACING THRESHOLD PULSEWIDTH: 0.5 ms
MDC IDC MSMT LEADCHNL RA PACING THRESHOLD PULSEWIDTH: 0.5 ms
MDC IDC MSMT LEADCHNL RV IMPEDANCE VALUE: 387.5 Ohm
MDC IDC PG IMPLANT DT: 20170727
MDC IDC PG SERIAL: 7261095
MDC IDC SESS DTM: 20190806102256
MDC IDC SET LEADCHNL RV PACING AMPLITUDE: 2.5 V
MDC IDC SET LEADCHNL RV SENSING SENSITIVITY: 0.5 mV
MDC IDC STAT BRADY RA PERCENT PACED: 2.4 %
MDC IDC STAT BRADY RV PERCENT PACED: 0 %

## 2018-06-26 NOTE — Patient Instructions (Signed)
Medication Instructions:  Your physician recommends that you continue on your current medications as directed. Please refer to the Current Medication list given to you today.  *If you need a refill on your cardiac medications before your next appointment, please call your pharmacy*  Labwork: None ordered  Testing/Procedures: None ordered  Follow-Up: Remote monitoring is used to monitor your Pacemaker or ICD from home. This monitoring reduces the number of office visits required to check your device to one time per year. It allows [place] to keep an eye on the functioning of your device to ensure it is working properly. You are scheduled for a device check from home on 07/30/18. You may send your transmission at any time that day. If you have a wireless device, the transmission will be sent automatically. After your physician reviews your transmission, you will receive a postcard with your next transmission date.  Your physician wants you to follow-up in: 1 year with Dr. Curt Bears.  You will receive a reminder letter in the mail two months in advance. If you don't receive a letter, please call our office to schedule the follow-up appointment.  Thank you for choosing [facility]!!   Trinidad Curet, RN [phone]

## 2018-06-26 NOTE — Progress Notes (Signed)
Electrophysiology Office Note   Date:  06/26/2018   ID:  Hailey Wright, DOB 1946/11/02, MRN [id number]  PCP:  Hailey Battles, MD  Cardiologist:  Hailey Wright Primary Electrophysiologist:  Hailey Rabenold Meredith Leeds, MD    No chief complaint on file.    History of Present Illness: Hailey Wright is a 72 y.o. female who presents today for electrophysiology evaluation.   Hx CAD and ischemic cardiomyopathy. In March 2017, had emergent CABG with LIMA to the LAD and SVG to OM1. She is left with an ejection fraction of approximately 30%.  Had ICD placed 06/17/16.   Today, denies symptoms of palpitations, chest pain, shortness of breath, orthopnea, PND, lower extremity edema, claudication, dizziness, presyncope, syncope, bleeding, or neurologic sequela. The patient is tolerating medications without difficulties.  Overall she is doing well.  She has no major complaints today.  She recently had rotator cuff surgery and is undergoing rehab on her right shoulder.   Past Medical History:  Diagnosis Date  . Acute ST elevation myocardial infarction (STEMI) involving left anterior descending coronary artery ([facility]) 02/18/2016  . AICD (automatic cardioverter/defibrillator) present   . CAD (coronary artery disease) 06/04/2013  . CHF (congestive heart failure) ([facility])   . Coronary artery thrombosis ([facility]) 02/18/2016  . DVT (deep venous thrombosis) ([facility]) 1980s   RLE  . Hepatitis A infection 1987  . Hyperlipidemia   . Ischemic cardiomyopathy    a. Echo 5/17: EF 25-30%, anteroseptal, anterior, anteroseptal and apical akinesis, possible inferior hypokinesis, grade 1 diastolic dysfunction, no evidence of thrombus, trivial AI, mild MR, mild LAE  . MI (myocardial infarction) ([facility]) 1991   Inferior treated with TPA 1991, POBA RCA 1996  . Pneumonia 1980s X 1  . RAS (renal artery stenosis) ([facility])    Right renal stent 2005  . S/P Emergency CABG x 2 02/18/2016   LIMA to LAD, SVG to OM1, EVH via right thigh  . Type II diabetes  mellitus ([facility])    pt denies   Past Surgical History:  Procedure Laterality Date  . CARDIAC CATHETERIZATION N/A 02/18/2016   Procedure: Left Heart Cath and Coronary Angiography;  Surgeon: Leonie Man, MD;  Location: [facility];  Service: Cardiovascular;  Laterality: N/A;  . CORONARY ANGIOPLASTY  1991  . CORONARY ARTERY BYPASS GRAFT N/A 02/18/2016   Procedure: CORONARY ARTERY BYPASS GRAFTING (CABG) times 2 using left internal mammary and right greater saphenous vein;  Surgeon: Hailey Alberts, MD;  Location: [facility];  Service: Open Heart Surgery;  Laterality: N/A;  . EP IMPLANTABLE DEVICE N/A 06/16/2016   Procedure: ICD Implant;  Surgeon: Hailey Duplessis Meredith Leeds, MD;  Location: [facility];  Service: Cardiovascular;  Laterality: N/A;  . RENAL ARTERY STENT Right 2005  . RHINOPLASTY Bilateral 1968  . TONSILLECTOMY AND ADENOIDECTOMY  1950s     Current Outpatient Medications  Medication Sig Dispense Refill  . aspirin 81 MG tablet Take 81 mg by mouth daily.    . calcium-vitamin D (OSCAL WITH D) 500-200 MG-UNIT per tablet Take 1 tablet by mouth 2 (two) times daily.    . carboxymethylcellulose (REFRESH PLUS) 0.5 % SOLN Place 2 drops into both eyes daily as needed (for dry eyes).     Marland Kitchen Co-Enzyme Q10 200 MG CAPS Take 200 mg by mouth daily.     Marland Kitchen ezetimibe (ZETIA) 10 MG tablet Take 10 mg by mouth daily.    . furosemide (LASIX) 40 MG tablet Take 1 tablet (40 mg total) by mouth as  needed. 90 tablet 3  . losartan (COZAAR) 25 MG tablet Take 1 tablet (25 mg total) by mouth 3 (three) times daily. 270 tablet 3  . Magnesium 500 MG TABS Take 500 mg by mouth daily.     . metoprolol succinate (TOPROL-XL) 25 MG 24 hr tablet Take 1 tablet by mouth daily. HOLD MEDICATION IF SYSTOLIC BLOOD PRESSURE IS LESS THAN 100 (TOP NUMBER)    . Multiple Vitamin (MULTIVITAMIN) tablet Take 1 tablet by mouth daily.    Marland Kitchen NITROSTAT 0.4 MG SL tablet Place 0.4 mg under the tongue every 5 (five) minutes as needed for chest  pain.     . rosuvastatin (CRESTOR) 40 MG tablet Take 1 tablet (40 mg total) by mouth daily. 90 tablet 3   No current facility-administered medications for this visit.     Allergies:   Patient has no known allergies.   Social History:  The patient  reports that she quit smoking about 7 years ago. Her smoking use included cigarettes. She has a 40.00 pack-year smoking history. She has never used smokeless tobacco. She reports that she does not drink alcohol or use drugs.   Family History:  The patient's family history includes Heart attack (age of onset: 61) in her brother; Heart attack (age of onset: 39) in her father.    ROS:  Please see the history of present illness.   Otherwise, review of systems is positive for none.   All other systems are reviewed and negative.   PHYSICAL EXAM: VS:  BP 120/72   Pulse (!) 58   Ht 5' 6.75" (1.695 m)   Wt 174 lb 3.2 oz (79 kg)   LMP  (LMP Unknown)   SpO2 95%   BMI 27.49 kg/m  , BMI Body mass index is 27.49 kg/m. GEN: Well nourished, well developed, in no acute distress  HEENT: normal  Neck: no JVD, carotid bruits, or masses Cardiac: RRR; no murmurs, rubs, or gallops,no edema  Respiratory:  clear to auscultation bilaterally, normal work of breathing GI: soft, nontender, nondistended, + BS MS: no deformity or atrophy  Skin: warm and dry, device site well healed Neuro:  Strength and sensation are intact Psych: euthymic mood, full affect  EKG:  EKG is not ordered today. Personal review of the ekg ordered 06/12/18 shows anus rhythm, septal infarct, rate 62  Personal review of the device interrogation today. Results in [facility]: 03/22/2018: BUN 18; Creatinine, Ser 0.87; Hemoglobin 13.3; Platelets 163; Potassium 4.0; Sodium 143    Lipid Panel  No results found for: CHOL, TRIG, HDL, CHOLHDL, VLDL, LDLCALC, LDLDIRECT   Wt Readings from Last 3 Encounters:  06/26/18 174 lb 3.2 oz (79 kg)  06/12/18 171 lb (77.6 kg)  03/22/18 165 lb  (74.8 kg)      Other studies Reviewed: Additional studies/ records that were reviewed today include:  TTE 05/20/16 - Left ventricle: The cavity size was mildly dilated. Wall   thickness was normal. Systolic function was severely reduced. The   estimated ejection fraction was in the range of 25% to 30%. There   is akinesis of the anteroseptal and apical myocardium. Features   are consistent with a pseudonormal left ventricular filling   pattern, with concomitant abnormal relaxation and increased   filling pressure (grade 2 diastolic dysfunction). - Aortic valve: There was trivial regurgitation. - Mitral valve: Calcified annulus. Mildly thickened leaflets .   There was mild regurgitation. - Left atrium: The atrium was moderately dilated.  ASSESSMENT AND PLAN:  1.  Ischemic cardiomyopathy: Saint Jude dual-chamber ICD implanted 06/16/2016.  No ventricular arrhythmias noted.  She has had some likely atrial tachycardia, 8 seconds worth.  She is asymptomatic on optimal medical therapy.  No changes at this time.     2. CAD status post CABG: No current angina.  Continue with current management.  3. Hyperlipidemia: Continue statin  Current medicines are reviewed at length with the patient today.   The patient does not have concerns regarding her medicines.  The following changes were made today: None  Labs/ tests ordered today include:  No orders of the defined types were placed in this encounter.    Disposition:   FU with Day Greb with 12 months  Signed, Tywanna Seifer Meredith Leeds, MD  06/26/2018 8:53 AM     [facility] [address] [place] [address] [phone] (office) [phone] (fax)

## 2018-06-28 DIAGNOSIS — M25511 Pain in right shoulder: Secondary | ICD-10-CM | POA: Diagnosis not present

## 2018-07-09 ENCOUNTER — Telehealth: Payer: Self-pay

## 2018-07-09 NOTE — Telephone Encounter (Signed)
Returned patient call as requested by voice mail message.  Patient requested change in ICM remote transmission date since she will be out of town on 07/30/18.  Transmission rescheduled for 08/13/2018.  She said she is doing fine.

## 2018-07-19 ENCOUNTER — Encounter: Payer: Self-pay | Admitting: Cardiology

## 2018-07-19 ENCOUNTER — Ambulatory Visit (INDEPENDENT_AMBULATORY_CARE_PROVIDER_SITE_OTHER): Payer: Medicare Other | Admitting: Cardiology

## 2018-07-19 VITALS — BP 118/69 | HR 63 | Ht 66.0 in | Wt 176.4 lb

## 2018-07-19 DIAGNOSIS — I255 Ischemic cardiomyopathy: Secondary | ICD-10-CM

## 2018-07-19 DIAGNOSIS — I5022 Chronic systolic (congestive) heart failure: Secondary | ICD-10-CM

## 2018-07-19 DIAGNOSIS — Z79899 Other long term (current) drug therapy: Secondary | ICD-10-CM | POA: Diagnosis not present

## 2018-07-19 LAB — BASIC METABOLIC PANEL
BUN/Creatinine Ratio: 20 (ref 12–28)
BUN: 19 mg/dL (ref 8–27)
CALCIUM: 9.7 mg/dL (ref 8.7–10.3)
CHLORIDE: 101 mmol/L (ref 96–106)
CO2: 24 mmol/L (ref 20–29)
Creatinine, Ser: 0.93 mg/dL (ref 0.57–1.00)
GFR calc Af Amer: 72 mL/min/{1.73_m2} (ref >59)
GFR, EST NON AFRICAN AMERICAN: 62 mL/min/{1.73_m2} (ref >59)
GLUCOSE: 89 mg/dL (ref 65–99)
POTASSIUM: 4.7 mmol/L (ref 3.5–5.2)
SODIUM: 141 mmol/L (ref 134–144)

## 2018-07-19 NOTE — Patient Instructions (Signed)
Medication Instructions:  STOP- Losartan START- Entresto 24/26 mg 1 tablet daily  If you need a refill on your cardiac medications before your next appointment, please call your pharmacy.  Labwork: BMP Today HERE IN OUR OFFICE AT [facility]  Take the provided lab slips with you to the lab for your blood draw.   You will NOT need to fast   Testing/Procedures: None Ordered   Follow-Up: Your physician wants you to follow-up in: 3 Months.     Thank you for choosing [facility] at [facility]!!

## 2018-07-19 NOTE — Progress Notes (Signed)
Cardiology Office Note   Date:  07/19/2018   ID:  Hailey Wright, DOB [birth year], MRN [id number]  PCP:  Leanna Battles, MD  Cardiologist:   Minus Breeding, MD   Chief Complaint  Patient presents with  . Cardiomyopathy     History of Present Illness: Hailey Wright is a 72 y.o. female who presents for follow of CAD and ischemic cardiomyopathy.  She has a history of known coronary disease as previously described. She had a negative stress perfusion study in the fall of 2016.  However, she came back with symptoms of acute onset chest pain. The plan was an elective cardiac catheterization. However, prior to that presented with an acute anterior myocardial infarction. She had occlusion of the LAD with acute thrombus in the circumflex. She did have chronic occlusion of her right coronary artery. She was placed on intra-aortic balloon pump and had emergent CABG with a LIMA to the LAD and SVG to OM1. She is left with an ejection fraction of approximately 30%. She had an ICD placed.   Her EF improved on echo in July 2018 to 35%.   She presents for med titration.  I recently saw her before shoulder surgery.  Since that visit the EF was checked on echo and is unchanged at 35 - 40%.    Since I last saw her she has felt well.  She has been exercising and doing well.  She has been taking the beta blocker full dose daily and her BP has been OK although at times it is in the 98 systolic range.  The patient denies any new symptoms such as chest discomfort, neck or arm discomfort. There has been no new shortness of breath, PND or orthopnea. There have been no reported palpitations, presyncope or syncope.    Past Medical History:  Diagnosis Date  . Acute ST elevation myocardial infarction (STEMI) involving left anterior descending coronary artery ([facility]) 02/18/2016  . AICD (automatic cardioverter/defibrillator) present   . CAD (coronary artery disease) 06/04/2013  . CHF (congestive heart failure) ([place])   .  Coronary artery thrombosis ([place]) 02/18/2016  . DVT (deep venous thrombosis) ([facility]) 1980s   RLE  . Hepatitis A infection 1987  . Hyperlipidemia   . Ischemic cardiomyopathy    a. Echo 5/17: EF 25-30%, anteroseptal, anterior, anteroseptal and apical akinesis, possible inferior hypokinesis, grade 1 diastolic dysfunction, no evidence of thrombus, trivial AI, mild MR, mild LAE  . MI (myocardial infarction) ([facility]) 1991   Inferior treated with TPA 1991, POBA RCA 1996  . Pneumonia 1980s X 1  . RAS (renal artery stenosis) ([facility])    Right renal stent 2005  . S/P Emergency CABG x 2 02/18/2016   LIMA to LAD, SVG to OM1, EVH via right thigh  . Type II diabetes mellitus ([facility])    pt denies    Past Surgical History:  Procedure Laterality Date  . CARDIAC CATHETERIZATION N/A 02/18/2016   Procedure: Left Heart Cath and Coronary Angiography;  Surgeon: Leonie Man, MD;  Location: [facility];  Service: Cardiovascular;  Laterality: N/A;  . CORONARY ANGIOPLASTY  1991  . CORONARY ARTERY BYPASS GRAFT N/A 02/18/2016   Procedure: CORONARY ARTERY BYPASS GRAFTING (CABG) times 2 using left internal mammary and right greater saphenous vein;  Surgeon: Rexene Alberts, MD;  Location: [facility];  Service: Open Heart Surgery;  Laterality: N/A;  . EP IMPLANTABLE DEVICE N/A 06/16/2016   Procedure: ICD Implant;  Surgeon: Will Meredith Leeds, MD;  Location:  [facility];  Service: Cardiovascular;  Laterality: N/A;  . RENAL ARTERY STENT Right 2005  . RHINOPLASTY Bilateral 1968  . TONSILLECTOMY AND ADENOIDECTOMY  1950s     Current Outpatient Medications  Medication Sig Dispense Refill  . aspirin 81 MG tablet Take 81 mg by mouth daily.    . calcium-vitamin D (OSCAL WITH D) 500-200 MG-UNIT per tablet Take 1 tablet by mouth 2 (two) times daily.    . carboxymethylcellulose (REFRESH PLUS) 0.5 % SOLN Place 2 drops into both eyes daily as needed (for dry eyes).     Marland Kitchen Co-Enzyme Q10 200 MG CAPS Take 200 mg by mouth daily.      Marland Kitchen ezetimibe (ZETIA) 10 MG tablet Take 10 mg by mouth daily.    . furosemide (LASIX) 40 MG tablet Take 1 tablet (40 mg total) by mouth as needed. 90 tablet 3  . Magnesium 500 MG TABS Take 500 mg by mouth daily.     . metoprolol succinate (TOPROL-XL) 25 MG 24 hr tablet Take 1 tablet by mouth daily. HOLD MEDICATION IF SYSTOLIC BLOOD PRESSURE IS LESS THAN 100 (TOP NUMBER)    . Multiple Vitamin (MULTIVITAMIN) tablet Take 1 tablet by mouth daily.    Marland Kitchen NITROSTAT 0.4 MG SL tablet Place 0.4 mg under the tongue every 5 (five) minutes as needed for chest pain.     . rosuvastatin (CRESTOR) 40 MG tablet Take 1 tablet (40 mg total) by mouth daily. 90 tablet 3  . sacubitril-valsartan (ENTRESTO) 24-26 MG Take 1 tablet by mouth 2 (two) times daily.     No current facility-administered medications for this visit.     Allergies:   Patient has no known allergies.    ROS:  Please see the history of present illness.   Otherwise, review of systems are positive for none.   All other systems are reviewed and negative.    PHYSICAL EXAM: VS:  BP 118/69   Pulse 63   Ht 5\' 6"  (1.676 m)   Wt 176 lb 6.4 oz (80 kg)   LMP  (LMP Unknown)   BMI 28.47 kg/m  , BMI Body mass index is 28.47 kg/m.  GENERAL:  Well appearing NECK:  No jugular venous distention, waveform within normal limits, carotid upstroke brisk and symmetric, no bruits, no thyromegaly LUNGS:  Clear to auscultation bilaterally CHEST:  Well healed sternotomy scar. HEART:  PMI not displaced or sustained,S1 and S2 within normal limits, no S3, no S4, no clicks, no rubs, no murmurs ABD:  Flat, positive bowel sounds normal in frequency in pitch, no bruits, no rebound, no guarding, no midline pulsatile mass, no hepatomegaly, no splenomegaly EXT:  2 plus pulses throughout, no edema, no cyanosis no clubbing    EKG:  EKG is not ordered today. NA  Recent Labs: 03/22/2018: BUN 18; Creatinine, Ser 0.87; Hemoglobin 13.3; Platelets 163; Potassium 4.0; Sodium 143     Lipid Panel No results found for: CHOL, TRIG, HDL, CHOLHDL, VLDL, LDLCALC, LDLDIRECT    Wt Readings from Last 3 Encounters:  07/19/18 176 lb 6.4 oz (80 kg)  06/26/18 174 lb 3.2 oz (79 kg)  06/12/18 171 lb (77.6 kg)       Other studies Reviewed: Additional studies/ records that were reviewed today include: BP diary.  Review of the above records demonstrates:     ASSESSMENT AND PLAN:   CAD -     The patient has no new sypmtoms.  No further cardiovascular testing is indicated.  We  will continue with aggressive risk reduction and meds as listed.  Ischemic CM -         I would like to stop the Cozaar and try to start Entresto.  I think that her BP can tolerate this although we will be titrating slowly.   I will start 24/26 bid  HL -        She is at target.   No change in therapy.   ICD -     She saw Dr. Curt Bears since I saw her and she is up to date with followup.     Current medicines are reviewed at length with the patient today.  The patient does not have concerns regarding medicines.  The following changes have been made:   As above Labs/ tests ordered today include:     Orders Placed This Encounter  Procedures  . Basic Metabolic Panel (BMET)     Disposition:   FU with with me in 3 months.   Signed, Minus Breeding, MD  07/19/2018 10:15 AM    [place]

## 2018-08-06 ENCOUNTER — Other Ambulatory Visit: Payer: Self-pay

## 2018-08-06 MED ORDER — SACUBITRIL-VALSARTAN 24-26 MG PO TABS: 1.0000 | ORAL_TABLET | Freq: Two times a day (BID) | ORAL | 0 refills | Status: DC

## 2018-08-06 MED ORDER — SACUBITRIL-VALSARTAN 24-26 MG PO TABS: 1.0000 | ORAL_TABLET | Freq: Two times a day (BID) | ORAL | 2 refills | Status: DC

## 2018-08-13 ENCOUNTER — Ambulatory Visit (INDEPENDENT_AMBULATORY_CARE_PROVIDER_SITE_OTHER): Payer: Medicare Other

## 2018-08-13 ENCOUNTER — Ambulatory Visit (INDEPENDENT_AMBULATORY_CARE_PROVIDER_SITE_OTHER): Payer: Medicare Other | Admitting: *Deleted

## 2018-08-13 DIAGNOSIS — I5022 Chronic systolic (congestive) heart failure: Secondary | ICD-10-CM | POA: Diagnosis not present

## 2018-08-13 DIAGNOSIS — I255 Ischemic cardiomyopathy: Secondary | ICD-10-CM | POA: Diagnosis not present

## 2018-08-13 DIAGNOSIS — Z95810 Presence of automatic (implantable) cardiac defibrillator: Secondary | ICD-10-CM | POA: Diagnosis not present

## 2018-08-13 NOTE — Progress Notes (Signed)
Remote ICD transmission.   

## 2018-08-13 NOTE — Progress Notes (Signed)
EPIC Encounter for ICM Monitoring  Patient Name: Hailey Wright is a 72 y.o. female Date: 08/13/2018 Primary Care Physican: Leanna Battles, MD Primary Cardiologist: Hochrein Electrophysiologist: Curt Bears Dry Weight: Previous weight 170lbs       Attempted call to patient and unable to reach.  Left detailed message, per DPR, regarding transmission.  Transmission reviewed.    Thoracic impedance abnormal suggesting fluid accumulation starting 08/09/2018.  Prescribed: Furosemide 40 mg 1 tablet daily as needed.    Labs: 01/22/2018 Creatinine .093, BUN 16, Potassium 4.5, Sodium 143, EGFR 62-72 06/08/2017 Creatinine 1.04, BUN 25, Potassium 4.7, Sodium 142, EGFR 55-63 05/25/2017 Creatinine 0.88, BUN 22, Potassium 4.6, Sodium 142, EGFR 67-78 12/15/2016 Creatinine 0.97, BUN 17, Potassium 5.0, Sodium 144, EGFR 57  Recommendations: Left voice mail with ICM number and encouraged to call if experiencing any fluid symptoms.  Follow-up plan: ICM clinic phone appointment on 08/21/2018 to recheck fluid levels.   Office appointment scheduled 10/11/2018 with Dr. Percival Spanish.    Copy of ICM check sent to Dr. Curt Bears and Dr Percival Spanish.   3 month ICM trend: 08/13/2018    1 Year ICM trend:       [name], RN 08/13/2018 10:58 AM

## 2018-08-14 ENCOUNTER — Telehealth: Payer: Self-pay

## 2018-08-14 ENCOUNTER — Encounter: Payer: Self-pay | Admitting: Cardiology

## 2018-08-14 NOTE — Progress Notes (Addendum)
Patient returned call.  She said she noticed wedding ring was tight and took PRN fluid pill on 9/23.  Weight 169 lbs today.  Denied any shortness of breath.  Next ICM scheduled 08/21/18 to recheck fluid levels.

## 2018-08-14 NOTE — Telephone Encounter (Signed)
Remote ICM transmission received.  Attempted call to patient and left detailed message, per DPR, to return call regarding transmission.    

## 2018-08-21 ENCOUNTER — Ambulatory Visit (INDEPENDENT_AMBULATORY_CARE_PROVIDER_SITE_OTHER): Payer: Medicare Other

## 2018-08-21 DIAGNOSIS — Z95810 Presence of automatic (implantable) cardiac defibrillator: Secondary | ICD-10-CM

## 2018-08-21 DIAGNOSIS — I5022 Chronic systolic (congestive) heart failure: Secondary | ICD-10-CM

## 2018-08-21 NOTE — Progress Notes (Signed)
EPIC Encounter for ICM Monitoring  Patient Name: Hailey Wright is a 72 y.o. female Date: 08/21/2018 Primary Care Physican: Leanna Battles, MD Primary Cardiologist: Hochrein Electrophysiologist: Curt Bears Dry Weight:165lbs         Heart Failure questions reviewed, pt asymptomatic.   Thoracic impedance returned to normal after taking PRN Furosemide.   Prescribed: Furosemide 40 mg 1 tablet daily as needed.    Labs: 01/22/2018 Creatinine .093, BUN 16, Potassium 4.5, Sodium 143, EGFR 62-72 06/08/2017 Creatinine 1.04, BUN 25, Potassium 4.7, Sodium 142, EGFR 55-63 05/25/2017 Creatinine 0.88, BUN 22, Potassium 4.6, Sodium 142, EGFR 67-78 12/15/2016 Creatinine 0.97, BUN 17, Potassium 5.0, Sodium 144, EGFR 57  Recommendations: No changes.    Encouraged to call for fluid symptoms.  Follow-up plan: ICM clinic phone appointment on 09/13/2018.     Copy of ICM check sent to Dr. Curt Bears.   3 month ICM trend: 08/21/2018    1 Year ICM trend:       [name], RN 08/21/2018 7:56 AM

## 2018-08-25 DIAGNOSIS — Z23 Encounter for immunization: Secondary | ICD-10-CM | POA: Diagnosis not present

## 2018-09-01 LAB — CUP PACEART REMOTE DEVICE CHECK
Battery Remaining Longevity: 72 mo
Battery Remaining Percentage: 73 %
Brady Statistic AP VP Percent: 0 %
Brady Statistic AP VS Percent: 1.3 %
Brady Statistic AS VS Percent: 99 %
Brady Statistic RA Percent Paced: 1.1 %
Brady Statistic RV Percent Paced: 1 %
HIGH POWER IMPEDANCE MEASURED VALUE: 74 Ohm
HighPow Impedance: 74 Ohm
Implantable Lead Implant Date: 20170727
Implantable Lead Implant Date: 20170727
Implantable Lead Location: 753859
Implantable Lead Location: 753860
Implantable Pulse Generator Implant Date: 20170727
Lead Channel Impedance Value: 430 Ohm
Lead Channel Pacing Threshold Amplitude: 0.5 V
Lead Channel Pacing Threshold Amplitude: 1 V
Lead Channel Pacing Threshold Pulse Width: 0.5 ms
Lead Channel Sensing Intrinsic Amplitude: 5 mV
Lead Channel Setting Pacing Amplitude: 2 V
Lead Channel Setting Pacing Pulse Width: 0.5 ms
MDC IDC MSMT BATTERY VOLTAGE: 2.96 V
MDC IDC MSMT LEADCHNL RA PACING THRESHOLD PULSEWIDTH: 0.5 ms
MDC IDC MSMT LEADCHNL RV IMPEDANCE VALUE: 440 Ohm
MDC IDC MSMT LEADCHNL RV SENSING INTR AMPL: 12 mV
MDC IDC SESS DTM: 20190923065005
MDC IDC SET LEADCHNL RV PACING AMPLITUDE: 2.5 V
MDC IDC SET LEADCHNL RV SENSING SENSITIVITY: 0.5 mV
MDC IDC STAT BRADY AS VP PERCENT: 1 %
Pulse Gen Serial Number: 7261095

## 2018-09-13 ENCOUNTER — Ambulatory Visit (INDEPENDENT_AMBULATORY_CARE_PROVIDER_SITE_OTHER): Payer: Medicare Other

## 2018-09-13 DIAGNOSIS — I5022 Chronic systolic (congestive) heart failure: Secondary | ICD-10-CM

## 2018-09-13 DIAGNOSIS — Z95810 Presence of automatic (implantable) cardiac defibrillator: Secondary | ICD-10-CM

## 2018-09-13 NOTE — Progress Notes (Signed)
EPIC Encounter for ICM Monitoring  Patient Name: Hailey Wright is a 72 y.o. female Date: 09/13/2018 Primary Care Physican: Leanna Battles, MD Primary Cardiologist: Hochrein Electrophysiologist: Curt Bears Dry Weight:164lbs                                                Heart Failure questions reviewed, pt reported rings were tight while on vacation during decreased impedance.  She took PRN Furosemide after returning from vacation.   Thoracic impedance normal.   Prescribed: Furosemide 40 mg 1 tablet daily as needed.    Labs: 01/22/2018 Creatinine .093, BUN 16, Potassium 4.5, Sodium 143, EGFR 62-72 06/08/2017 Creatinine 1.04, BUN 25, Potassium 4.7, Sodium 142, EGFR 55-63 05/25/2017 Creatinine 0.88, BUN 22, Potassium 4.6, Sodium 142, EGFR 67-78 12/15/2016 Creatinine 0.97, BUN 17, Potassium 5.0, Sodium 144, EGFR 57  Recommendations: No changes.    Encouraged to call for fluid symptoms.  Follow-up plan: ICM clinic phone appointment on 10/15/2018.   Office appointment scheduled 10/11/2018 with Dr. Percival Spanish.    Copy of ICM check sent to Dr. Curt Bears.   3 month ICM trend: 09/13/2018    1 Year ICM trend:       [name], RN 09/13/2018 12:31 PM

## 2018-10-01 DIAGNOSIS — Z1231 Encounter for screening mammogram for malignant neoplasm of breast: Secondary | ICD-10-CM | POA: Diagnosis not present

## 2018-10-10 NOTE — Progress Notes (Signed)
Cardiology Office Note   Date:  10/11/2018   ID:  Hailey Wright, DOB Nov 16, 1946, MRN [id number]  PCP:  Leanna Battles, MD  Cardiologist:   Minus Breeding, MD   Chief Complaint  Patient presents with  . Coronary Artery Disease     History of Present Illness: Hailey Wright is a 72 y.o. female who presents for follow of CAD and ischemic cardiomyopathy.  She has a history of known coronary disease as previously described. She had a negative stress perfusion study in the fall of 2016.  However, she came back with symptoms of acute onset chest pain. The plan was an elective cardiac catheterization. However, prior to that presented with an acute anterior myocardial infarction. She had occlusion of the LAD with acute thrombus in the circumflex. She did have chronic occlusion of her right coronary artery. She was placed on intra-aortic balloon pump and had emergent CABG with a LIMA to the LAD and SVG to OM1. She is left with an ejection fraction of approximately 30%. She had an ICD placed.   Her EF improved on echo in July 2018 to 35%.   She presents for med titration.  I recently saw her before shoulder surgery.  Since that visit the EF was checked on echo and is unchanged at 35 - 40%.    At the last visit I started [facility] and stopped Cozaar.   She did very well with that change.  She feels well.  She is exercising an doing a rowing machine and treadmill.  The patient denies any new symptoms such as chest discomfort, neck or arm discomfort. There has been no new shortness of breath, PND or orthopnea. There have been no reported palpitations, presyncope or syncope.   Past Medical History:  Diagnosis Date  . Acute ST elevation myocardial infarction (STEMI) involving left anterior descending coronary artery ([place]) 02/18/2016  . AICD (automatic cardioverter/defibrillator) present   . CAD (coronary artery disease) 06/04/2013  . CHF (congestive heart failure) ([place])   . Coronary artery thrombosis  ([place]) 02/18/2016  . DVT (deep venous thrombosis) ([place]) 1980s   RLE  . Hepatitis A infection 1987  . Hyperlipidemia   . Ischemic cardiomyopathy    a. Echo 5/17: EF 25-30%, anteroseptal, anterior, anteroseptal and apical akinesis, possible inferior hypokinesis, grade 1 diastolic dysfunction, no evidence of thrombus, trivial AI, mild MR, mild LAE  . MI (myocardial infarction) ([place]) 1991   Inferior treated with TPA 1991, POBA RCA 1996  . Pneumonia 1980s X 1  . RAS (renal artery stenosis) ([facility])    Right renal stent 2005  . S/P Emergency CABG x 2 02/18/2016   LIMA to LAD, SVG to OM1, EVH via right thigh  . Type II diabetes mellitus ([place])    pt denies    Past Surgical History:  Procedure Laterality Date  . CARDIAC CATHETERIZATION N/A 02/18/2016   Procedure: Left Heart Cath and Coronary Angiography;  Surgeon: Leonie Man, MD;  Location: [place] [facility];  Service: Cardiovascular;  Laterality: N/A;  . CORONARY ANGIOPLASTY  1991  . CORONARY ARTERY BYPASS GRAFT N/A 02/18/2016   Procedure: CORONARY ARTERY BYPASS GRAFTING (CABG) times 2 using left internal mammary and right greater saphenous vein;  Surgeon: Rexene Alberts, MD;  Location: [place];  Service: Open Heart Surgery;  Laterality: N/A;  . EP IMPLANTABLE DEVICE N/A 06/16/2016   Procedure: ICD Implant;  Surgeon: Will Meredith Leeds, MD;  Location: [place] [facility];  Service: Cardiovascular;  Laterality: N/A;  . RENAL ARTERY STENT Right 2005  . RHINOPLASTY Bilateral 1968  . TONSILLECTOMY AND ADENOIDECTOMY  1950s     Current Outpatient Medications  Medication Sig Dispense Refill  . aspirin 81 MG tablet Take 81 mg by mouth daily.    . calcium-vitamin D (OSCAL WITH D) 500-200 MG-UNIT per tablet Take 1 tablet by mouth 2 (two) times daily.    . carboxymethylcellulose (REFRESH PLUS) 0.5 % SOLN Place 2 drops into both eyes daily as needed (for dry eyes).     Marland Kitchen Co-Enzyme Q10 200 MG CAPS Take 200 mg by mouth daily.     Marland Kitchen ezetimibe (ZETIA) 10  MG tablet Take 10 mg by mouth daily.    . furosemide (LASIX) 40 MG tablet Take 1 tablet (40 mg total) by mouth as needed. 90 tablet 3  . Magnesium 500 MG TABS Take 500 mg by mouth daily.     . metoprolol succinate (TOPROL-XL) 25 MG 24 hr tablet Take 1 tablet by mouth daily. HOLD MEDICATION IF SYSTOLIC BLOOD PRESSURE IS LESS THAN 100 (TOP NUMBER)    . Multiple Vitamin (MULTIVITAMIN) tablet Take 1 tablet by mouth daily.    Marland Kitchen NITROSTAT 0.4 MG SL tablet Place 0.4 mg under the tongue every 5 (five) minutes as needed for chest pain.     . rosuvastatin (CRESTOR) 40 MG tablet Take 1 tablet (40 mg total) by mouth daily. 90 tablet 3  . sacubitril-valsartan (ENTRESTO) 24-26 MG Take 1 tablet by mouth 2 (two) times daily. 180 tablet 2   No current facility-administered medications for this visit.     Allergies:   Patient has no known allergies.    ROS:  Please see the history of present illness.   Otherwise, review of systems are positive for none.   All other systems are reviewed and negative.    PHYSICAL EXAM: VS:  BP (!) 108/58   Pulse 75   Ht 5\' 6"  (1.676 m)   Wt 166 lb 3.2 oz (75.4 kg)   LMP  (LMP Unknown)   SpO2 96%   BMI 26.83 kg/m  , BMI Body mass index is 26.83 kg/m.  GENERAL:  Well appearing NECK:  No jugular venous distention, waveform within normal limits, carotid upstroke brisk and symmetric, no bruits, no thyromegaly LUNGS:  Clear to auscultation bilaterally CHEST:  Well healed ICD pocket HEART:  PMI not displaced or sustained,S1 and S2 within normal limits, no S3, no S4, no clicks, no rubs, no murmurs ABD:  Flat, positive bowel sounds normal in frequency in pitch, no bruits, no rebound, no guarding, no midline pulsatile mass, no hepatomegaly, no splenomegaly EXT:  2 plus pulses throughout, no edema, no cyanosis no clubbing    EKG:  EKG is not ordered today. NA  Recent Labs: 03/22/2018: Hemoglobin 13.3; Platelets 163 07/19/2018: BUN 19; Creatinine, Ser 0.93; Potassium 4.7;  Sodium 141    Lipid Panel No results found for: CHOL, TRIG, HDL, CHOLHDL, VLDL, LDLCALC, LDLDIRECT    Wt Readings from Last 3 Encounters:  10/11/18 166 lb 3.2 oz (75.4 kg)  07/19/18 176 lb 6.4 oz (80 kg)  06/26/18 174 lb 3.2 oz (79 kg)       Other studies Reviewed: Additional studies/ records that were reviewed today include: NA Review of the above records demonstrates:     ASSESSMENT AND PLAN:   CAD -     The patient has no new sypmtoms.  No further cardiovascular testing is indicated.  We will  continue with aggressive risk reduction and meds as listed.  Ischemic CM -           I reviewed her blood pressures as they often run in the 90s and sometimes even in the 80s though she is asymptomatic.  Therefore, I cannot titrate her medications.  She will continue what she is on.  No change in therapy.   HL -        She is not quite at target but she started Zetia since she was last seen and she is going to get a lipid profile done in December.  I will follow-up with these results.  ICD -     She is up-to-date with ICD follow-up.  She had normal thoracic impedance last month.  I reviewed these records.   No change in therapy.    Current medicines are reviewed at length with the patient today.  The patient does not have concerns regarding medicines.  The following changes have been made:   None Labs/ tests ordered today include:  None   No orders of the defined types were placed in this encounter.    Disposition:   FU with with me in 3 months.   Signed, Minus Breeding, MD  10/11/2018 8:40 AM    [place]

## 2018-10-11 ENCOUNTER — Encounter: Payer: Self-pay | Admitting: Cardiology

## 2018-10-11 ENCOUNTER — Ambulatory Visit (INDEPENDENT_AMBULATORY_CARE_PROVIDER_SITE_OTHER): Payer: Medicare Other | Admitting: Cardiology

## 2018-10-11 VITALS — BP 108/58 | HR 75 | Ht 66.0 in | Wt 166.2 lb

## 2018-10-11 DIAGNOSIS — I255 Ischemic cardiomyopathy: Secondary | ICD-10-CM

## 2018-10-11 DIAGNOSIS — I2510 Atherosclerotic heart disease of native coronary artery without angina pectoris: Secondary | ICD-10-CM

## 2018-10-11 DIAGNOSIS — E785 Hyperlipidemia, unspecified: Secondary | ICD-10-CM

## 2018-10-11 NOTE — Patient Instructions (Signed)
Medication Instructions:  Continue current medications  If you need a refill on your cardiac medications before your next appointment, please call your pharmacy.  Labwork: None Ordered   If you have labs (blood work) drawn today and your tests are completely normal, you will receive your results only by: Marland Kitchen MyChart Message (if you have MyChart) OR . A paper copy in the mail If you have any lab test that is abnormal or we need to change your treatment, we will call you to review the results.  Testing/Procedures: None Ordered  Follow-Up: . You will need a follow up appointment in 3 Months.     At [facility], you and your health needs are our priority.  As part of our continuing mission to provide you with exceptional heart care, we have created designated Provider Care Teams.  These Care Teams include your primary Cardiologist (physician) and Advanced Practice Providers (APPs -  Physician Assistants and Nurse Practitioners) who all work together to provide you with the care you need, when you need it.   Thank you for choosing [facility] at [facility]!!

## 2018-10-15 ENCOUNTER — Ambulatory Visit (INDEPENDENT_AMBULATORY_CARE_PROVIDER_SITE_OTHER): Payer: Medicare Other

## 2018-10-15 DIAGNOSIS — Z95810 Presence of automatic (implantable) cardiac defibrillator: Secondary | ICD-10-CM

## 2018-10-15 DIAGNOSIS — I5022 Chronic systolic (congestive) heart failure: Secondary | ICD-10-CM

## 2018-10-15 NOTE — Progress Notes (Signed)
EPIC Encounter for ICM Monitoring  Patient Name: Hailey Wright is a 72 y.o. female Date: 10/15/2018 Primary Care Physican: Leanna Battles, MD Primary Cardiologist: Hochrein Electrophysiologist: Curt Bears Last Weight:164lbs  Today's Weight:  unknown       Attempted call to patient and unable to reach.  Left detailed message, per DPR, regarding transmission.  Transmission reviewed.    Thoracic impedance normal.   Prescribed: Furosemide 40 mg 1 tablet daily as needed.    Labs: 07/19/2018 Creatinine 0.93, BUN 19, Potassium 4.7, Sodium 141, eGFR 62-72 01/22/2018 Creatinine .093, BUN 16, Potassium 4.5, Sodium 143, EGFR 62-72 06/08/2017 Creatinine 1.04, BUN 25, Potassium 4.7, Sodium 142, EGFR 55-63 05/25/2017 Creatinine 0.88, BUN 22, Potassium 4.6, Sodium 142, EGFR 67-78 12/15/2016 Creatinine 0.97, BUN 17, Potassium 5.0, Sodium 144, EGFR 57  Recommendations: Left voice mail with ICM number and encouraged to call if experiencing any fluid symptoms.  Follow-up plan: ICM clinic phone appointment on 11/15/2018.     Copy of ICM check sent to Dr. Curt Bears.   3 month ICM trend: 10/15/2018    1 Year ICM trend:       [name], RN 10/15/2018 12:50 PM

## 2018-10-16 ENCOUNTER — Telehealth: Payer: Self-pay

## 2018-10-16 NOTE — Telephone Encounter (Signed)
Remote ICM transmission received.  Attempted call to patient regarding ICM remote transmission and left detailed message, per DPR, with next ICM remote transmission date of 11/15/2018.  Advised to return call for any fluid symptoms or questions.  ;

## 2018-11-02 DIAGNOSIS — R7309 Other abnormal glucose: Secondary | ICD-10-CM | POA: Diagnosis not present

## 2018-11-02 DIAGNOSIS — I10 Essential (primary) hypertension: Secondary | ICD-10-CM | POA: Diagnosis not present

## 2018-11-02 DIAGNOSIS — E7849 Other hyperlipidemia: Secondary | ICD-10-CM | POA: Diagnosis not present

## 2018-11-02 DIAGNOSIS — R82998 Other abnormal findings in urine: Secondary | ICD-10-CM | POA: Diagnosis not present

## 2018-11-15 ENCOUNTER — Ambulatory Visit (INDEPENDENT_AMBULATORY_CARE_PROVIDER_SITE_OTHER): Payer: Medicare Other

## 2018-11-15 DIAGNOSIS — I5022 Chronic systolic (congestive) heart failure: Secondary | ICD-10-CM | POA: Diagnosis not present

## 2018-11-15 DIAGNOSIS — I255 Ischemic cardiomyopathy: Secondary | ICD-10-CM | POA: Diagnosis not present

## 2018-11-15 DIAGNOSIS — Z95810 Presence of automatic (implantable) cardiac defibrillator: Secondary | ICD-10-CM

## 2018-11-15 NOTE — Progress Notes (Signed)
EPIC Encounter for ICM Monitoring  Patient Name: Hailey Wright is a 72 y.o. female Date: 11/15/2018 Primary Care Physican: Leanna Battles, MD Primary Cardiologist: Hochrein Electrophysiologist: Curt Bears Last Weight:164lbs             Today's Weight:  unknown                                                   Transmission reviewed.    Thoracic impedance normal.   Prescribed: Furosemide 40 mg 1 tablet daily as needed.    Labs: 07/19/2018 Creatinine 0.93, BUN 19, Potassium 4.7, Sodium 141, eGFR 62-72 01/22/2018 Creatinine .093, BUN 16, Potassium 4.5, Sodium 143, EGFR 62-72 06/08/2017 Creatinine 1.04, BUN 25, Potassium 4.7, Sodium 142, EGFR 55-63 05/25/2017 Creatinine 0.88, BUN 22, Potassium 4.6, Sodium 142, EGFR 67-78 12/15/2016 Creatinine 0.97, BUN 17, Potassium 5.0, Sodium 144, EGFR 57  Recommendations: None  Follow-up plan: ICM clinic phone appointment on 12/17/2018     Copy of ICM check sent to Dr. Curt Bears.   3 month ICM trend: 11/15/2018    1 Year ICM trend:       [name], RN 11/15/2018 1:28 PM

## 2018-11-15 NOTE — Progress Notes (Signed)
Remote ICD transmission.   

## 2018-11-17 LAB — CUP PACEART REMOTE DEVICE CHECK
Battery Remaining Longevity: 70 mo
Battery Remaining Percentage: 72 %
Battery Voltage: 2.96 V
Brady Statistic AS VP Percent: 1 %
Brady Statistic RA Percent Paced: 1 %
Date Time Interrogation Session: 20191226070017
HIGH POWER IMPEDANCE MEASURED VALUE: 75 Ohm
HighPow Impedance: 75 Ohm
Implantable Lead Implant Date: 20170727
Implantable Lead Location: 753859
Implantable Lead Location: 753860
Implantable Pulse Generator Implant Date: 20170727
Lead Channel Impedance Value: 400 Ohm
Lead Channel Impedance Value: 400 Ohm
Lead Channel Pacing Threshold Amplitude: 1 V
Lead Channel Pacing Threshold Pulse Width: 0.5 ms
Lead Channel Pacing Threshold Pulse Width: 0.5 ms
Lead Channel Sensing Intrinsic Amplitude: 12 mV
Lead Channel Setting Pacing Amplitude: 2 V
Lead Channel Setting Pacing Amplitude: 2.5 V
Lead Channel Setting Pacing Pulse Width: 0.5 ms
MDC IDC LEAD IMPLANT DT: 20170727
MDC IDC MSMT LEADCHNL RA PACING THRESHOLD AMPLITUDE: 0.5 V
MDC IDC MSMT LEADCHNL RA SENSING INTR AMPL: 4.2 mV
MDC IDC SET LEADCHNL RV SENSING SENSITIVITY: 0.5 mV
MDC IDC STAT BRADY AP VP PERCENT: 1 %
MDC IDC STAT BRADY AP VS PERCENT: 1 %
MDC IDC STAT BRADY AS VS PERCENT: 99 %
MDC IDC STAT BRADY RV PERCENT PACED: 1 %
Pulse Gen Serial Number: 7261095

## 2018-11-19 ENCOUNTER — Ambulatory Visit
Admission: RE | Admit: 2018-11-19 | Discharge: 2018-11-19 | Disposition: A | Discharge status: Home / Self Care | Payer: Medicare Other | Source: Ambulatory Visit | Attending: Acute Care | Admitting: Acute Care

## 2018-11-19 DIAGNOSIS — Z87891 Personal history of nicotine dependence: Secondary | ICD-10-CM

## 2018-11-19 DIAGNOSIS — Z122 Encounter for screening for malignant neoplasm of respiratory organs: Secondary | ICD-10-CM

## 2018-11-23 DIAGNOSIS — I10 Essential (primary) hypertension: Secondary | ICD-10-CM | POA: Diagnosis not present

## 2018-11-23 DIAGNOSIS — R7309 Other abnormal glucose: Secondary | ICD-10-CM | POA: Diagnosis not present

## 2018-11-23 DIAGNOSIS — R05 Cough: Secondary | ICD-10-CM | POA: Diagnosis not present

## 2018-11-23 DIAGNOSIS — Z0000 Encounter for general adult medical examination without abnormal findings: Secondary | ICD-10-CM | POA: Diagnosis not present

## 2018-11-23 DIAGNOSIS — M25511 Pain in right shoulder: Secondary | ICD-10-CM | POA: Diagnosis not present

## 2018-11-23 DIAGNOSIS — Z9861 Coronary angioplasty status: Secondary | ICD-10-CM | POA: Diagnosis not present

## 2018-11-23 DIAGNOSIS — H029 Unspecified disorder of eyelid: Secondary | ICD-10-CM | POA: Diagnosis not present

## 2018-11-23 DIAGNOSIS — E7849 Other hyperlipidemia: Secondary | ICD-10-CM | POA: Diagnosis not present

## 2018-11-23 DIAGNOSIS — J439 Emphysema, unspecified: Secondary | ICD-10-CM | POA: Diagnosis not present

## 2018-11-23 DIAGNOSIS — D126 Benign neoplasm of colon, unspecified: Secondary | ICD-10-CM | POA: Diagnosis not present

## 2018-11-23 DIAGNOSIS — Z1389 Encounter for screening for other disorder: Secondary | ICD-10-CM | POA: Diagnosis not present

## 2018-11-23 DIAGNOSIS — Z6827 Body mass index (BMI) 27.0-27.9, adult: Secondary | ICD-10-CM | POA: Diagnosis not present

## 2018-11-28 ENCOUNTER — Other Ambulatory Visit: Payer: Self-pay | Admitting: Cardiology

## 2018-11-29 DIAGNOSIS — Z1212 Encounter for screening for malignant neoplasm of rectum: Secondary | ICD-10-CM | POA: Diagnosis not present

## 2018-11-30 ENCOUNTER — Other Ambulatory Visit: Payer: Self-pay | Admitting: Acute Care

## 2018-11-30 DIAGNOSIS — Z87891 Personal history of nicotine dependence: Secondary | ICD-10-CM

## 2018-11-30 DIAGNOSIS — Z122 Encounter for screening for malignant neoplasm of respiratory organs: Secondary | ICD-10-CM

## 2018-12-17 ENCOUNTER — Ambulatory Visit (INDEPENDENT_AMBULATORY_CARE_PROVIDER_SITE_OTHER): Payer: Medicare Other

## 2018-12-17 DIAGNOSIS — I5022 Chronic systolic (congestive) heart failure: Secondary | ICD-10-CM | POA: Diagnosis not present

## 2018-12-17 DIAGNOSIS — Z95810 Presence of automatic (implantable) cardiac defibrillator: Secondary | ICD-10-CM | POA: Diagnosis not present

## 2018-12-18 ENCOUNTER — Telehealth: Payer: Self-pay

## 2018-12-18 NOTE — Telephone Encounter (Signed)
Remote ICM transmission received.  Attempted call to patient regarding ICM remote transmission and left detailed message, per DPR, with next ICM remote transmission date of 01/21/2019.  Advised to return call for any fluid symptoms or questions.    

## 2018-12-18 NOTE — Progress Notes (Signed)
EPIC Encounter for ICM Monitoring  Patient Name: Hailey Wright is a 73 y.o. female Date: 12/18/2018 Primary Care Physican: Leanna Battles, MD Primary Cardiologist: Hochrein Electrophysiologist: Curt Bears LastWeight:164lbs Today's Weight: unknown   Attempted call to patient.  Left detailed message per DPR regarding transmission. Transmission reviewed.   Thoracic impedance normal.   Prescribed:Furosemide 40 mg 1 tablet daily as needed.    Labs: 07/19/2018 Creatinine 0.93, BUN 19, Potassium 4.7, Sodium 141, eGFR 62-72 01/22/2018 Creatinine .093, BUN 16, Potassium 4.5, Sodium 143, EGFR 62-72 06/08/2017 Creatinine 1.04, BUN 25, Potassium 4.7, Sodium 142, EGFR 55-63 05/25/2017 Creatinine 0.88, BUN 22, Potassium 4.6, Sodium 142, EGFR 67-78 12/15/2016 Creatinine 0.97, BUN 17, Potassium 5.0, Sodium 144, EGFR 57  Recommendations:Left voice mail with ICM number and encouraged to call if experiencing any fluid symptoms.  Follow-up plan: ICM clinic phone appointment on3/12/2018   Copy of ICM check sent to [facility].   3 month ICM trend: 12/17/2018    1 Year ICM trend:       [name], RN 12/18/2018 3:44 PM

## 2018-12-20 ENCOUNTER — Telehealth: Payer: Self-pay | Admitting: Internal Medicine

## 2018-12-20 NOTE — Telephone Encounter (Signed)
Hi Dr, Carlean Purl   We have received a referral from Patient PCP for a repeat colon. Patient has a colon in 2013 at the [place]. We received records for review. Patient is requesting to see you seen you take care of her mother.

## 2018-12-24 NOTE — Telephone Encounter (Signed)
Due to my current patient backlog though I appreciate her asking for me she will need to see a different [facility] MD.

## 2018-12-26 ENCOUNTER — Telehealth: Payer: Self-pay | Admitting: Gastroenterology

## 2018-12-26 NOTE — Telephone Encounter (Signed)
PT has been notified and completely understands. PT would like Dr. Tonye Royalty for her second option. Can I have PT record to give to Dr. Carole Civil for review Please.

## 2018-12-26 NOTE — Telephone Encounter (Signed)
Pt records were reviewed by Dr.Gessner. However Dr.Gessner has requested for the pt records be placed on another providers desk. Pt records were place on Dr.Armbruster desk

## 2018-12-27 NOTE — Telephone Encounter (Signed)
Please forward to Dr. Hilarie Fredrickson

## 2019-01-04 ENCOUNTER — Other Ambulatory Visit: Payer: Self-pay | Admitting: Cardiology

## 2019-01-15 ENCOUNTER — Telehealth: Payer: Self-pay | Admitting: Gastroenterology

## 2019-01-15 NOTE — Telephone Encounter (Signed)
Pt was referred for a colonoscopy.  Pt informed that she has an implanted cardio defibrillator.  Please advise scheduling.

## 2019-01-15 NOTE — Telephone Encounter (Signed)
Hailey Wright can a patient be done in the [facility] with implantable defib?

## 2019-01-15 NOTE — Progress Notes (Signed)
Cardiology Office Note   Date:  01/17/2019   ID:  [name], [name] 1946-09-18, MRN [id number]  PCP:  Hailey Battles, MD  Cardiologist:   Hailey Breeding, MD   Chief Complaint  Patient presents with  . Coronary Artery Disease     History of Present Illness: Hailey Wright is a 73 y.o. female who presents for follow of CAD and ischemic cardiomyopathy.  She has a history of known coronary disease as previously described. She had a negative stress perfusion study in the fall of 2016.  However, she came back with symptoms of acute onset chest pain. The plan was an elective cardiac catheterization. However, prior to that presented with an acute anterior myocardial infarction. She had occlusion of the LAD with acute thrombus in the circumflex. She did have chronic occlusion of her right coronary artery. She was placed on intra-aortic balloon pump and had emergent CABG with a LIMA to the LAD and SVG to OM1. She is left with an ejection fraction of approximately 30%. She had an ICD placed.   Her EF improved on echo in July 2018 to 35%. EF was 35 - 40% in July of last year.  I have been titrating meds.  At the last visit I was not able to titrate meds because of low BPs.    Since I last saw her she has had a little bit of chest discomfort.  She gets this if she is on the treadmill walking at about 3 miles.  She can back off on the treadmill and the discomfort goes away.  She has not had to take any nitroglycerin.  She can go up and down stairs and do her usual housework without bringing this on.  She might of brought it on in the past caring to trash cans at a time to the curb.  It seems to be a stable pattern.  She is not having any resting discomfort.  Had some mild chest discomfort and she does not get associated neck or jaw discomfort.  There is no nausea vomiting or diaphoresis.  She is not describing new shortness of breath, PND or orthopnea.  She is had no weight gain or edema.    Past Medical  History:  Diagnosis Date  . Acute ST elevation myocardial infarction (STEMI) involving left anterior descending coronary artery ([place]) 02/18/2016  . AICD (automatic cardioverter/defibrillator) present   . CAD (coronary artery disease) 06/04/2013  . CHF (congestive heart failure) ([place])   . Coronary artery thrombosis ([place]) 02/18/2016  . DVT (deep venous thrombosis) ([place]) 1980s   RLE  . Hepatitis A infection 1987  . Hyperlipidemia   . Ischemic cardiomyopathy    a. Echo 5/17: EF 25-30%, anteroseptal, anterior, anteroseptal and apical akinesis, possible inferior hypokinesis, grade 1 diastolic dysfunction, no evidence of thrombus, trivial AI, mild MR, mild LAE  . MI (myocardial infarction) ([place]) 1991   Inferior treated with TPA 1991, POBA RCA 1996  . Pneumonia 1980s X 1  . RAS (renal artery stenosis) ([facility])    Right renal stent 2005  . S/P Emergency CABG x 2 02/18/2016   LIMA to LAD, SVG to OM1, EVH via right thigh  . Type II diabetes mellitus ([place])    pt denies    Past Surgical History:  Procedure Laterality Date  . CARDIAC CATHETERIZATION N/A 02/18/2016   Procedure: Left Heart Cath and Coronary Angiography;  Surgeon: Hailey Man, MD;  Location: [place] [facility];  Service: Cardiovascular;  Laterality: N/A;  . CORONARY ANGIOPLASTY  1991  . CORONARY ARTERY BYPASS GRAFT N/A 02/18/2016   Procedure: CORONARY ARTERY BYPASS GRAFTING (CABG) times 2 using left internal mammary and right greater saphenous vein;  Surgeon: Hailey Alberts, MD;  Location: [place];  Service: Open Heart Surgery;  Laterality: N/A;  . EP IMPLANTABLE DEVICE N/A 06/16/2016   Procedure: ICD Implant;  Surgeon: Hailey Meredith Leeds, MD;  Location: [place] [facility];  Service: Cardiovascular;  Laterality: N/A;  . RENAL ARTERY STENT Right 2005  . RHINOPLASTY Bilateral 1968  . TONSILLECTOMY AND ADENOIDECTOMY  1950s     Current Outpatient Medications  Medication Sig Dispense Refill  . aspirin 81 MG tablet Take 81 mg by mouth  daily.    . calcium-vitamin D (OSCAL WITH D) 500-200 MG-UNIT per tablet Take 1 tablet by mouth 2 (two) times daily.    . carboxymethylcellulose (REFRESH PLUS) 0.5 % SOLN Place 2 drops into both eyes daily as needed (for dry eyes).     Marland Kitchen Co-Enzyme Q10 200 MG CAPS Take 200 mg by mouth daily.     Marland Kitchen ezetimibe (ZETIA) 10 MG tablet Take 10 mg by mouth daily.    . furosemide (LASIX) 40 MG tablet Take 1 tablet (40 mg total) by mouth as needed. 90 tablet 3  . Magnesium 500 MG TABS Take 500 mg by mouth daily.     . metoprolol succinate (TOPROL-XL) 25 MG 24 hr tablet Take 25 mg by mouth daily.    . Multiple Vitamin (MULTIVITAMIN) tablet Take 1 tablet by mouth daily.    Marland Kitchen NITROSTAT 0.4 MG SL tablet Place 0.4 mg under the tongue every 5 (five) minutes as needed for chest pain.     . rosuvastatin (CRESTOR) 40 MG tablet TAKE 1 TABLET DAILY 90 tablet 4  . sacubitril-valsartan (ENTRESTO) 24-26 MG Take 1 tablet by mouth 2 (two) times daily. 180 tablet 2  . isosorbide mononitrate (IMDUR) 30 MG 24 hr tablet Take 1 tablet (30 mg total) by mouth daily. 90 tablet 3   No current facility-administered medications for this visit.     Allergies:   Patient has no known allergies.    ROS:  Please see the history of present illness.   Otherwise, review of systems are positive for none.   All other systems are reviewed and negative.    PHYSICAL EXAM: VS:  BP (!) 96/54   Pulse (!) 56   Ht 5\' 6"  (1.676 m)   Wt 171 lb (77.6 kg)   LMP  (LMP Unknown)   BMI 27.60 kg/m  , BMI Body mass index is 27.6 kg/m.  GENERAL:  Well appearing NECK:  No jugular venous distention, waveform within normal limits, carotid upstroke brisk and symmetric, no bruits, no thyromegaly LUNGS:  Clear to auscultation bilaterally CHEST:  Well healed sternotomy scar. HEART:  PMI not displaced or sustained,S1 and S2 within normal limits, no S3, no S4, no clicks, no rubs, no murmurs ABD:  Flat, positive bowel sounds normal in frequency in pitch, no  bruits, no rebound, no guarding, no midline pulsatile mass, no hepatomegaly, no splenomegaly EXT:  2 plus pulses throughout, no edema, no cyanosis no clubbing   EKG:  EKG is ordered today. Sinus rhythm, rate 56, axis with left bundle branch block, no acute ST-T wave changes.  Recent Labs: 03/22/2018: Hemoglobin 13.3; Platelets 163 07/19/2018: BUN 19; Creatinine, Ser 0.93; Potassium 4.7; Sodium 141    Lipid Panel No results found for: CHOL, TRIG,  HDL, CHOLHDL, VLDL, LDLCALC, LDLDIRECT    Wt Readings from Last 3 Encounters:  01/17/19 171 lb (77.6 kg)  10/11/18 166 lb 3.2 oz (75.4 kg)  07/19/18 176 lb 6.4 oz (80 kg)       Other studies Reviewed: Additional studies/ records that were reviewed today include: Labs Review of the above records demonstrates:   See elsewhere  ASSESSMENT AND PLAN:   CAD -    The patient does have some chest discomfort but it seems to only be with exertion.  I am going to add Imdur 30 mg daily.  She Hailey let me know if the chest pain pattern changes at which point we might need to consider further testing but at this point it is a stable exertional angina discomfort and can be managed medically.   Ischemic CM -       Her blood pressure is low and so this Hailey not allow further med titration.  No other change in therapy.  She brings a blood pressure diary today and I reviewed this.  HL -     LDL was 54 with an HDL of 63.  She Hailey continue the meds as listed.    ICD -     She is up-to-date with ICD follow-up.  She had normal thoracic impedance in Dec.  No change in terhapy. .  I reviewed these records.      Current medicines are reviewed at length with the patient today.  The patient does not have concerns regarding medicines.  The following changes have been made:   As above Labs/ tests ordered today include:  None   Orders Placed This Encounter  Procedures  . EKG 12-Lead     Disposition:   FU with with me in 6 months.   Signed, Hailey Breeding, MD  01/17/2019 9:10 AM    [place]

## 2019-01-15 NOTE — Telephone Encounter (Signed)
Yes ma'am, they can as long as EF is greater than 35 %

## 2019-01-16 ENCOUNTER — Telehealth: Payer: Self-pay | Admitting: Cardiology

## 2019-01-16 NOTE — Telephone Encounter (Signed)
Last EF I see in chart states LV EF 35-40%. Is that ok for [facility]?

## 2019-01-16 NOTE — Telephone Encounter (Signed)
Attempted patient call and left message that she can send a manual remote transmission on 3/4 since she will not be in town on 3/2.  Advised if she has any difficulty sending the report to call.  Provided ICM number for call back.

## 2019-01-16 NOTE — Telephone Encounter (Signed)
New Message  Pt verbalized had device check on third of march and she is not going to be at home that night. Pt verbalized she can do it on the 4th but no the third.  Please f/u with patient

## 2019-01-17 ENCOUNTER — Encounter: Payer: Self-pay | Admitting: Cardiology

## 2019-01-17 ENCOUNTER — Ambulatory Visit (INDEPENDENT_AMBULATORY_CARE_PROVIDER_SITE_OTHER): Payer: Medicare Other | Admitting: Cardiology

## 2019-01-17 VITALS — BP 96/54 | HR 56 | Ht 66.0 in | Wt 171.0 lb

## 2019-01-17 DIAGNOSIS — E785 Hyperlipidemia, unspecified: Secondary | ICD-10-CM

## 2019-01-17 DIAGNOSIS — I2510 Atherosclerotic heart disease of native coronary artery without angina pectoris: Secondary | ICD-10-CM | POA: Diagnosis not present

## 2019-01-17 DIAGNOSIS — I255 Ischemic cardiomyopathy: Secondary | ICD-10-CM | POA: Diagnosis not present

## 2019-01-17 MED ORDER — ISOSORBIDE MONONITRATE ER 30 MG PO TB24: 30.0000 mg | ORAL_TABLET | Freq: Every day | ORAL | 3 refills | Status: DC

## 2019-01-17 NOTE — Patient Instructions (Signed)
Medication Instructions:  START- Isosorbide 30 mg daily  If you need a refill on your cardiac medications before your next appointment, please call your pharmacy.  Labwork: None Ordered   Testing/Procedures: None Ordered  Follow-Up: You will need a follow up appointment in 6 months.  Please call our office 2 months in advance to schedule this appointment.  You may see Dr Percival Spanish or one of the following Advanced Practice Providers on your designated Care Team:   Rosaria Ferries, PA-C . Jory Sims, DNP, ANP    At [facility], you and your health needs are our priority.  As part of our continuing mission to provide you with exceptional heart care, we have created designated Provider Care Teams.  These Care Teams include your primary Cardiologist (physician) and Advanced Practice Providers (APPs -  Physician Assistants and Nurse Practitioners) who all work together to provide you with the care you need, when you need it.  Thank you for choosing [facility] at [facility]!!

## 2019-01-18 NOTE — Telephone Encounter (Signed)
John please advise if this pt can be done in the [facility].

## 2019-01-18 NOTE — Telephone Encounter (Signed)
Vaughan Basta,  This should qualify for [place] as the criteria states 35% or LESS-  however, I think I would send this to [employer] CRNA and have him review the chart to make sure it's anesthesia approved.  Lelan Pons

## 2019-01-18 NOTE — Telephone Encounter (Signed)
Linda and Dr. Hilarie Fredrickson,  I have reveiwed this pt's chart.  With her EF of 35-40% and excellent activity level her procedure can be performed at [facility].  Thanks,  Osvaldo Angst

## 2019-01-21 ENCOUNTER — Ambulatory Visit (INDEPENDENT_AMBULATORY_CARE_PROVIDER_SITE_OTHER): Payer: Medicare Other

## 2019-01-21 DIAGNOSIS — I5022 Chronic systolic (congestive) heart failure: Secondary | ICD-10-CM | POA: Diagnosis not present

## 2019-01-21 DIAGNOSIS — Z95810 Presence of automatic (implantable) cardiac defibrillator: Secondary | ICD-10-CM | POA: Diagnosis not present

## 2019-01-21 NOTE — Progress Notes (Signed)
EPIC Encounter for ICM Monitoring  Patient Name: Hailey Wright is a 73 y.o. female Date: 01/21/2019 Primary Care Physican: Leanna Battles, MD Primary Cardiologist: Hochrein Electrophysiologist: Curt Bears LastWeight:164lbs Today's Weight: unknown   Heart failure questions reviewed and no complaints.  She has a dry cough but it is not related to fluid.  She said this has been ongoing for several weeks and she discussed with Dr Percival Spanish at last visit.   Thoracic impedance normal.   Prescribed:Furosemide 40 mg 1 tablet daily as needed.    Labs: 07/19/2018 Creatinine 0.93, BUN 19, Potassium 4.7, Sodium 141, eGFR 62-72 01/22/2018 Creatinine .093, BUN 16, Potassium 4.5, Sodium 143, EGFR 62-72 06/08/2017 Creatinine 1.04, BUN 25, Potassium 4.7, Sodium 142, EGFR 55-63 05/25/2017 Creatinine 0.88, BUN 22, Potassium 4.6, Sodium 142, EGFR 67-78 12/15/2016 Creatinine 0.97, BUN 17, Potassium 5.0, Sodium 144, EGFR 57  Recommendations: No changes and encouraged to call for fluid symptoms.  Follow-up plan: ICM clinic phone appointment on4/04/2019  Copy of ICM check sent to [facility]  3 month ICM trend: 01/21/2019    1 Year ICM trend:       [name], RN 01/21/2019 4:33 PM

## 2019-01-22 DIAGNOSIS — R05 Cough: Secondary | ICD-10-CM | POA: Diagnosis not present

## 2019-01-22 DIAGNOSIS — J111 Influenza due to unidentified influenza virus with other respiratory manifestations: Secondary | ICD-10-CM | POA: Diagnosis not present

## 2019-01-22 DIAGNOSIS — J069 Acute upper respiratory infection, unspecified: Secondary | ICD-10-CM | POA: Diagnosis not present

## 2019-02-25 ENCOUNTER — Other Ambulatory Visit: Payer: Self-pay

## 2019-02-25 ENCOUNTER — Ambulatory Visit (INDEPENDENT_AMBULATORY_CARE_PROVIDER_SITE_OTHER): Payer: Medicare Other | Admitting: *Deleted

## 2019-02-25 DIAGNOSIS — I255 Ischemic cardiomyopathy: Secondary | ICD-10-CM

## 2019-02-25 LAB — CUP PACEART REMOTE DEVICE CHECK
Battery Remaining Longevity: 67 mo
Battery Remaining Percentage: 69 %
Battery Voltage: 2.96 V
Brady Statistic AP VP Percent: 1 %
Brady Statistic AP VS Percent: 1 %
Brady Statistic AS VP Percent: 1 %
Brady Statistic AS VS Percent: 99 %
Brady Statistic RA Percent Paced: 1 %
Brady Statistic RV Percent Paced: 1 %
Date Time Interrogation Session: 20200406060017
HighPow Impedance: 75 Ohm
HighPow Impedance: 75 Ohm
Implantable Lead Implant Date: 20170727
Implantable Lead Implant Date: 20170727
Implantable Lead Location: 753859
Implantable Lead Location: 753860
Implantable Pulse Generator Implant Date: 20170727
Lead Channel Impedance Value: 400 Ohm
Lead Channel Impedance Value: 480 Ohm
Lead Channel Pacing Threshold Amplitude: 0.5 V
Lead Channel Pacing Threshold Amplitude: 1 V
Lead Channel Pacing Threshold Pulse Width: 0.5 ms
Lead Channel Pacing Threshold Pulse Width: 0.5 ms
Lead Channel Sensing Intrinsic Amplitude: 12 mV
Lead Channel Sensing Intrinsic Amplitude: 5 mV
Lead Channel Setting Pacing Amplitude: 2 V
Lead Channel Setting Pacing Amplitude: 2.5 V
Lead Channel Setting Pacing Pulse Width: 0.5 ms
Lead Channel Setting Sensing Sensitivity: 0.5 mV
Pulse Gen Serial Number: 7261095

## 2019-02-26 ENCOUNTER — Ambulatory Visit (INDEPENDENT_AMBULATORY_CARE_PROVIDER_SITE_OTHER): Payer: Medicare Other

## 2019-02-26 ENCOUNTER — Other Ambulatory Visit: Payer: Self-pay

## 2019-02-26 DIAGNOSIS — Z95810 Presence of automatic (implantable) cardiac defibrillator: Secondary | ICD-10-CM | POA: Diagnosis not present

## 2019-02-26 DIAGNOSIS — I5022 Chronic systolic (congestive) heart failure: Secondary | ICD-10-CM

## 2019-02-27 ENCOUNTER — Telehealth: Payer: Self-pay

## 2019-02-27 NOTE — Progress Notes (Signed)
EPIC Encounter for ICM Monitoring  Patient Name: Hailey Wright is a 73 y.o. female Date: 02/27/2019 Primary Care Physican: Leanna Battles, MD Primary Cardiologist: Hochrein Electrophysiologist: Curt Bears LastWeight:164lbs 02/27/2019 Weight: unknown   Attempted call to patient and unable to reach.  Left detailed message per DPR regarding transmission. Transmission reviewed.   Thoracic impedance normal.   Prescribed:Furosemide 40 mg 1 tablet daily as needed.    Labs: 07/19/2018 Creatinine 0.93, BUN 19, Potassium 4.7, Sodium 141, eGFR 62-72 01/22/2018 Creatinine .093, BUN 16, Potassium 4.5, Sodium 143, EGFR 62-72 06/08/2017 Creatinine 1.04, BUN 25, Potassium 4.7, Sodium 142, EGFR 55-63 05/25/2017 Creatinine 0.88, BUN 22, Potassium 4.6, Sodium 142, EGFR 67-78 12/15/2016 Creatinine 0.97, BUN 17, Potassium 5.0, Sodium 144, EGFR 57  Recommendations:Left voice mail with ICM number and encouraged to call if experiencing any fluid symptoms.  Follow-up plan: ICM clinic phone appointment on5/09/2019  Copy of ICM check sent to [facility].   3 month ICM trend: 02/25/2019    1 Year ICM trend:       [name], RN 02/27/2019 10:01 AM

## 2019-02-27 NOTE — Telephone Encounter (Signed)
Remote ICM transmission received.  Attempted call to patient regarding ICM remote transmission and left detailed message, per DPR, with next ICM remote transmission date of 04/01/2019.  Advised to return call for any fluid symptoms or questions.

## 2019-03-05 ENCOUNTER — Encounter: Payer: Self-pay | Admitting: Cardiology

## 2019-03-05 NOTE — Progress Notes (Signed)
Remote ICD transmission.   

## 2019-03-25 DIAGNOSIS — Z9861 Coronary angioplasty status: Secondary | ICD-10-CM | POA: Diagnosis not present

## 2019-03-25 DIAGNOSIS — R05 Cough: Secondary | ICD-10-CM | POA: Diagnosis not present

## 2019-03-25 DIAGNOSIS — H029 Unspecified disorder of eyelid: Secondary | ICD-10-CM | POA: Diagnosis not present

## 2019-03-25 DIAGNOSIS — M25511 Pain in right shoulder: Secondary | ICD-10-CM | POA: Diagnosis not present

## 2019-03-27 ENCOUNTER — Other Ambulatory Visit: Payer: Self-pay | Admitting: *Deleted

## 2019-03-27 MED ORDER — METOPROLOL SUCCINATE ER 25 MG PO TB24: 25.0000 mg | ORAL_TABLET | Freq: Every day | ORAL | 3 refills | Status: DC

## 2019-04-01 ENCOUNTER — Other Ambulatory Visit: Payer: Self-pay

## 2019-04-01 ENCOUNTER — Ambulatory Visit (INDEPENDENT_AMBULATORY_CARE_PROVIDER_SITE_OTHER): Payer: Medicare Other

## 2019-04-01 DIAGNOSIS — I5022 Chronic systolic (congestive) heart failure: Secondary | ICD-10-CM | POA: Diagnosis not present

## 2019-04-01 DIAGNOSIS — Z95810 Presence of automatic (implantable) cardiac defibrillator: Secondary | ICD-10-CM | POA: Diagnosis not present

## 2019-04-03 NOTE — Progress Notes (Signed)
EPIC Encounter for ICM Monitoring  Patient Name: Hailey Wright is a 73 y.o. female Date: 04/03/2019 Primary Care Physican: Leanna Battles, MD Primary Cardiologist: Hochrein Electrophysiologist: Curt Bears 02/27/2019 Weight: 164 lbs 04/03/2019 Weight:    Transmission reviewed. Spoke with patient and she is doing fine. Her PCP has told her she has the start of COPD.   Thoracic impedance normal.   Prescribed:Furosemide 40 mg 1 tablet daily as needed.  Has not taken any Lasix within the last month.  Labs: 07/19/2018 Creatinine 0.93, BUN 19, Potassium 4.7, Sodium 141, GFR 62-72 01/22/2018 Creatinine .093, BUN 16, Potassium 4.5, Sodium 143, GFR 62-72 06/08/2017 Creatinine 1.04, BUN 25, Potassium 4.7, Sodium 142, GFR 55-63 05/25/2017 Creatinine 0.88, BUN 22, Potassium 4.6, Sodium 142, GFR 67-78 12/15/2016 Creatinine 0.97, BUN 17, Potassium 5.0, Sodium 144, GFR 57  Recommendations:Encouraged to call if experiencing any fluid symptoms.  Follow-up plan: ICM clinic phone appointment on6/15/2020.  Copy of ICM check sent to [facility]   3 month ICM trend: 04/01/2019    1 Year ICM trend:       [name], RN 04/03/2019 8:17 AM

## 2019-04-23 ENCOUNTER — Ambulatory Visit (AMBULATORY_SURGERY_CENTER): Payer: Self-pay

## 2019-04-23 ENCOUNTER — Other Ambulatory Visit: Payer: Self-pay

## 2019-04-23 VITALS — Ht 67.0 in | Wt 172.0 lb

## 2019-04-23 DIAGNOSIS — Z1211 Encounter for screening for malignant neoplasm of colon: Secondary | ICD-10-CM

## 2019-04-23 MED ORDER — NA SULFATE-K SULFATE-MG SULF 17.5-3.13-1.6 GM/177ML PO SOLN: 1.0000 | Freq: Once | ORAL | 0 refills | Status: AC

## 2019-04-23 NOTE — Progress Notes (Signed)
Denies allergies to eggs or soy products. Denies complication of anesthesia or sedation. Denies use of weight loss medication. Denies use of O2.   Emmi instructions given for colonoscopy.  Pre-Visit was conducted by phone due to Covid 19. Instructions were reviewed with patient an mailed to patients confirmed home address. Patient was encouraged to call if she has any questions or concerns regarding instructions.

## 2019-05-03 ENCOUNTER — Other Ambulatory Visit: Payer: Self-pay | Admitting: Cardiology

## 2019-05-06 ENCOUNTER — Ambulatory Visit (INDEPENDENT_AMBULATORY_CARE_PROVIDER_SITE_OTHER): Payer: Medicare Other

## 2019-05-06 DIAGNOSIS — I5022 Chronic systolic (congestive) heart failure: Secondary | ICD-10-CM

## 2019-05-06 DIAGNOSIS — Z95810 Presence of automatic (implantable) cardiac defibrillator: Secondary | ICD-10-CM

## 2019-05-07 ENCOUNTER — Telehealth: Payer: Self-pay | Admitting: Gastroenterology

## 2019-05-07 NOTE — Telephone Encounter (Signed)

## 2019-05-07 NOTE — Telephone Encounter (Signed)
Rx has been sent to the pharmacy electronically. ° °

## 2019-05-07 NOTE — Progress Notes (Signed)
EPIC Encounter for ICM Monitoring  Patient Name: Hailey Wright is a 73 y.o. female Date: 05/07/2019 Primary Care Physican: Leanna Battles, MD Primary Cardiologist: Hochrein Electrophysiologist: Curt Bears LastWeight:164lbs 05/07/2019 Weight: 170 lbs   Transmission reviewed and patient reports she is feeling but did take a fluid pill during decreased impedance.    Corvue thoracic impedance normal but suggests possible fluid accumulation from 6/6 - 6/13.  Prescribed:Furosemide 40 mg 1 tablet daily as needed.    Labs: 07/19/2018 Creatinine 0.93, BUN 19, Potassium 4.7, Sodium 141, GFR 62-72 01/22/2018 Creatinine .093, BUN 16, Potassium 4.5, Sodium 143, GFR 62-72 06/08/2017 Creatinine 1.04, BUN 25, Potassium 4.7, Sodium 142, GFR 55-63 05/25/2017 Creatinine 0.88, BUN 22, Potassium 4.6, Sodium 142, GFR 67-78 12/15/2016 Creatinine 0.97, BUN 17, Potassium 5.0, Sodium 144, GFR 57  Recommendations: No changes and encouraged to call for any fluid symptoms.  Follow-up plan: ICM clinic phone appointment on7/20/2020  Copy of ICM check sent to [facility].   3 month ICM trend: 05/06/2019    1 Year ICM trend:       [name], RN 05/07/2019 2:57 PM

## 2019-05-08 ENCOUNTER — Other Ambulatory Visit: Payer: Self-pay

## 2019-05-08 ENCOUNTER — Encounter: Payer: Self-pay | Admitting: Gastroenterology

## 2019-05-08 ENCOUNTER — Ambulatory Visit (AMBULATORY_SURGERY_CENTER): Payer: Medicare Other | Admitting: Gastroenterology

## 2019-05-08 VITALS — BP 111/39 | HR 55 | Temp 97.9°F | Resp 16 | Ht 66.0 in | Wt 171.0 lb

## 2019-05-08 DIAGNOSIS — D123 Benign neoplasm of transverse colon: Secondary | ICD-10-CM | POA: Diagnosis not present

## 2019-05-08 DIAGNOSIS — D122 Benign neoplasm of ascending colon: Secondary | ICD-10-CM

## 2019-05-08 DIAGNOSIS — Z1211 Encounter for screening for malignant neoplasm of colon: Secondary | ICD-10-CM | POA: Diagnosis not present

## 2019-05-08 DIAGNOSIS — D125 Benign neoplasm of sigmoid colon: Secondary | ICD-10-CM | POA: Diagnosis not present

## 2019-05-08 MED ORDER — SODIUM CHLORIDE 0.9 % IV SOLN: 500.0000 mL | Freq: Once | INTRAVENOUS | Status: DC

## 2019-05-08 NOTE — Op Note (Signed)
[place] Patient Name: Hailey Wright Procedure Date: 05/08/2019 2:09 PM MRN: [id number] Endoscopist: Remo Lipps P. Havery Moros , MD Age: 73 Referring MD:  Date of Birth: Apr 02, 1946 Gender: Female Account #: [id number] Procedure:                Colonoscopy Indications:              Screening for colorectal malignant neoplasm Medicines:                Monitored Anesthesia Care Procedure:                Pre-Anesthesia Assessment:                           - Prior to the procedure, a History and Physical                            was performed, and patient medications and                            allergies were reviewed. The patient's tolerance of                            previous anesthesia was also reviewed. The risks                            and benefits of the procedure and the sedation                            options and risks were discussed with the patient.                            All questions were answered, and informed consent                            was obtained. Prior Anticoagulants: The patient has                            taken no previous anticoagulant or antiplatelet                            agents. ASA Grade Assessment: III - A patient with                            severe systemic disease. After reviewing the risks                            and benefits, the patient was deemed in                            satisfactory condition to undergo the procedure.                           After obtaining informed consent, the colonoscope  was passed under direct vision. Throughout the                            procedure, the patient's blood pressure, pulse, and                            oxygen saturations were monitored continuously. The                            Colonoscope was introduced through the anus and                            advanced to the the cecum, identified by                            appendiceal orifice  and ileocecal valve. The                            colonoscopy was performed without difficulty. The                            patient tolerated the procedure well. The quality                            of the bowel preparation was adequate. The                            ileocecal valve, appendiceal orifice, and rectum                            were photographed. Scope In: 2:12:19 PM Scope Out: 2:48:42 PM Scope Withdrawal Time: 0 hours 27 minutes 58 seconds  Total Procedure Duration: 0 hours 36 minutes 23 seconds  Findings:                 The perianal and digital rectal examinations were                            normal.                           The colon was tortuous, difficult cecal intubation,                            severe left sided diverticular disease.                           Many medium-mouthed diverticula were found in the                            sigmoid colon.                           Two sessile polyps were found in the ascending  colon. The polyps were 3 to 4 mm in size. These                            polyps were removed with a cold snare. Resection                            and retrieval were complete.                           Two sessile polyps were found in the hepatic                            flexure. The polyps were 3 mm in size. These polyps                            were removed with a cold snare. Resection and                            retrieval were complete.                           A diminutive polyp was found in the sigmoid colon.                            The polyp was sessile. The polyp was removed with a                            cold snare. Resection and retrieval were complete.                           The exam was otherwise without abnormality. Complications:            No immediate complications. Estimated blood loss:                            Minimal. Estimated Blood Loss:     Estimated blood loss  was minimal. Impression:               - Tortuous colon.                           - Diverticulosis in the sigmoid colon.                           - Two 3 to 4 mm polyps in the ascending colon,                            removed with a cold snare. Resected and retrieved.                           - One 3 mm polyp at the hepatic flexure, removed                            with a cold snare. Resected  and retrieved.                           - One diminutive polyp in the sigmoid colon,                            removed with a cold snare. Resected and retrieved.                           - The examination was otherwise normal. Recommendation:           - Patient has a contact number available for                            emergencies. The signs and symptoms of potential                            delayed complications were discussed with the                            patient. Return to normal activities tomorrow.                            Written discharge instructions were provided to the                            patient.                           - Resume previous diet.                           - Continue present medications.                           - Await pathology results. Remo Lipps P. Hailey Rylee, MD 05/08/2019 2:54:26 PM This report has been signed electronically.

## 2019-05-08 NOTE — Progress Notes (Signed)
[place]

## 2019-05-08 NOTE — Progress Notes (Signed)
Called to room to assist during endoscopic procedure.  Patient ID and intended procedure confirmed with present staff. Received instructions for my participation in the procedure from the performing physician.  

## 2019-05-08 NOTE — Patient Instructions (Signed)
Handouts : Polyps and Diverticulosis  YOU HAD AN ENDOSCOPIC PROCEDURE TODAY AT [facility]:   Refer to the procedure report that was given to you for any specific questions about what was found during the examination.  If the procedure report does not answer your questions, please call your gastroenterologist to clarify.  If you requested that your care partner not be given the details of your procedure findings, then the procedure report has been included in a sealed envelope for you to review at your convenience later.  YOU SHOULD EXPECT: Some feelings of bloating in the abdomen. Passage of more gas than usual.  Walking can help get rid of the air that was put into your GI tract during the procedure and reduce the bloating. If you had a lower endoscopy (such as a colonoscopy or flexible sigmoidoscopy) you may notice spotting of blood in your stool or on the toilet paper. If you underwent a bowel prep for your procedure, you may not have a normal bowel movement for a few days.  Please Note:  You might notice some irritation and congestion in your nose or some drainage.  This is from the oxygen used during your procedure.  There is no need for concern and it should clear up in a day or so.  SYMPTOMS TO REPORT IMMEDIATELY:   Following lower endoscopy (colonoscopy or flexible sigmoidoscopy):  Excessive amounts of blood in the stool  Significant tenderness or worsening of abdominal pains  Swelling of the abdomen that is new, acute  Fever of 100F or higher   For urgent or emergent issues, a gastroenterologist can be reached at any hour by calling [phone].   DIET:  We do recommend a small meal at first, but then you may proceed to your regular diet.  Drink plenty of fluids but you should avoid alcoholic beverages for 24 hours.  ACTIVITY:  You should plan to take it easy for the rest of today and you should NOT DRIVE or use heavy machinery until tomorrow (because of the  sedation medicines used during the test).    FOLLOW UP: Our staff will call the number listed on your records 48-72 hours following your procedure to check on you and address any questions or concerns that you may have regarding the information given to you following your procedure. If we do not reach you, we will leave a message.  We will attempt to reach you two times.  During this call, we will ask if you have developed any symptoms of COVID 19. If you develop any symptoms (ie: fever, flu-like symptoms, shortness of breath, cough etc.) before then, please call [phone].  If you test positive for Covid 19 in the 2 weeks post procedure, please call and report this information to [place].    If any biopsies were taken you will be contacted by phone or by letter within the next 1-3 weeks.  Please call us at [phone] if you have not heard about the biopsies in 3 weeks.    SIGNATURES/CONFIDENTIALITY: You and/or your care partner have signed paperwork which will be entered into your electronic medical record.  These signatures attest to the fact that that the information above on your After Visit Summary has been reviewed and is understood.  Full responsibility of the confidentiality of this discharge information lies with you and/or your care-partner.

## 2019-05-08 NOTE — Progress Notes (Signed)
A/ox3, pleased with MAC, report to RN 

## 2019-05-10 ENCOUNTER — Telehealth: Payer: Self-pay

## 2019-05-10 NOTE — Telephone Encounter (Signed)
  Follow up Call-  Call back number 05/08/2019  Post procedure Call Back phone  # [phone]  Permission to leave phone message Yes  Some recent data might be hidden     Patient questions:  Do you have a fever, pain , or abdominal swelling? No. Pain Score  0 *  Have you tolerated food without any problems? Yes.    Have you been able to return to your normal activities? Yes.    Do you have any questions about your discharge instructions: Diet   No. Medications  No. Follow up visit  No.  Do you have questions or concerns about your Care? No.  Actions: * If pain score is 4 or above: No action needed, pain <4.  1. Have you developed a fever since your procedure? no  2.   Have you had an respiratory symptoms (SOB or cough) since your procedure? no  3.   Have you tested positive for COVID 19 since your procedure no  4.   Have you had any family members/close contacts diagnosed with the COVID 19 since your procedure?  no   If yes to any of these questions please route to Joylene John, RN and Alphonsa Gin, Therapist, sports.

## 2019-05-27 ENCOUNTER — Ambulatory Visit (INDEPENDENT_AMBULATORY_CARE_PROVIDER_SITE_OTHER): Payer: Medicare Other | Admitting: *Deleted

## 2019-05-27 DIAGNOSIS — I255 Ischemic cardiomyopathy: Secondary | ICD-10-CM

## 2019-05-27 LAB — CUP PACEART REMOTE DEVICE CHECK
Date Time Interrogation Session: 20200706131708
Implantable Lead Implant Date: 20170727
Implantable Lead Implant Date: 20170727
Implantable Lead Location: 753859
Implantable Lead Location: 753860
Implantable Pulse Generator Implant Date: 20170727
Pulse Gen Serial Number: 7261095

## 2019-06-04 ENCOUNTER — Encounter: Payer: Self-pay | Admitting: Cardiology

## 2019-06-04 NOTE — Progress Notes (Signed)
Remote ICD transmission.   

## 2019-06-10 ENCOUNTER — Ambulatory Visit (INDEPENDENT_AMBULATORY_CARE_PROVIDER_SITE_OTHER): Payer: Medicare Other

## 2019-06-10 DIAGNOSIS — I5022 Chronic systolic (congestive) heart failure: Secondary | ICD-10-CM

## 2019-06-10 DIAGNOSIS — Z95810 Presence of automatic (implantable) cardiac defibrillator: Secondary | ICD-10-CM | POA: Diagnosis not present

## 2019-06-14 NOTE — Progress Notes (Signed)
EPIC Encounter for ICM Monitoring  Patient Name: Hailey Wright is a 73 y.o. female Date: 06/14/2019 Primary Care Physican: Leanna Battles, MD Primary Cardiologist: Hochrein Electrophysiologist: Curt Bears LastWeight:164lbs 6/16/2020Weight: 170 lbs 06/14/2019 Weight: 170 lbs   Transmission reviewed and patient reports she is feeling fine.  Corvue thoracic impedance normal.  Prescribed:Furosemide 40 mg 1 tablet daily as needed.    Labs: 07/19/2018 Creatinine 0.93, BUN 19, Potassium 4.7, Sodium 141, GFR 62-72  Recommendations: No changes and encouraged to call for any fluid symptoms.  Follow-up plan: ICM clinic phone appointment on8/25/2020. OV with Dr Curt Bears 07/09/2019.  Copy of ICM check sent to [facility].   3 month ICM trend: 06/10/2019    1 Year ICM trend:       [name], RN 06/14/2019 12:47 PM

## 2019-07-08 ENCOUNTER — Ambulatory Visit (INDEPENDENT_AMBULATORY_CARE_PROVIDER_SITE_OTHER): Payer: Medicare Other | Admitting: Cardiology

## 2019-07-08 ENCOUNTER — Encounter: Payer: Self-pay | Admitting: Cardiology

## 2019-07-08 ENCOUNTER — Other Ambulatory Visit: Payer: Self-pay

## 2019-07-08 VITALS — BP 100/60 | HR 64 | Ht 66.0 in | Wt 176.4 lb

## 2019-07-08 DIAGNOSIS — I255 Ischemic cardiomyopathy: Secondary | ICD-10-CM

## 2019-07-08 NOTE — Progress Notes (Signed)
Electrophysiology Office Note   Date:  07/08/2019   ID:  Hailey Wright, DOB May 21, 1946, MRN [id number]  PCP:  Leanna Battles, MD  Cardiologist:  Hochrein Primary Electrophysiologist:   Meredith Leeds, MD    No chief complaint on file.    History of Present Illness: Hailey Wright is a 73 y.o. female who presents today for electrophysiology evaluation.   Hx CAD and ischemic cardiomyopathy. In March 2017, had emergent CABG with LIMA to the LAD and SVG to OM1. She is left with an ejection fraction of approximately 30%.  Had ICD placed 06/17/16.   Today, denies symptoms of palpitations, chest pain, shortness of breath, orthopnea, PND, lower extremity edema, claudication, dizziness, presyncope, syncope, bleeding, or neurologic sequela. The patient is tolerating medications without difficulties.  No complaints today.  Unfortunately she had a cough this past winter which was evaluated and was diagnosed as COPD.  She has been put on medications which are greatly helped.  She was having chest pain last February and was put on Imdur which is also helped with her chest pain.   Past Medical History:  Diagnosis Date  . Acute ST elevation myocardial infarction (STEMI) involving left anterior descending coronary artery ([facility]) 02/18/2016  . AICD (automatic cardioverter/defibrillator) present   . CAD (coronary artery disease) 06/04/2013  . CHF (congestive heart failure) ([place])   . Clotting disorder ([place])   . COPD (chronic obstructive pulmonary disease) ([place])   . Coronary artery thrombosis ([place]) 02/18/2016  . DVT (deep venous thrombosis) ([place]) 1980s   RLE  . Hepatitis A infection 1987  . Hyperlipidemia   . Hypertension   . Ischemic cardiomyopathy    a. Echo 5/17: EF 25-30%, anteroseptal, anterior, anteroseptal and apical akinesis, possible inferior hypokinesis, grade 1 diastolic dysfunction, no evidence of thrombus, trivial AI, mild MR, mild LAE  . MI (myocardial infarction) ([place]) 1991   Inferior  treated with TPA 1991, POBA RCA 1996  . Pneumonia 1980s X 1  . RAS (renal artery stenosis) ([facility])    Right renal stent 2005  . S/P Emergency CABG x 2 02/18/2016   LIMA to LAD, SVG to OM1, EVH via right thigh  . Type II diabetes mellitus ([place])    pt denies   Past Surgical History:  Procedure Laterality Date  . CARDIAC CATHETERIZATION N/A 02/18/2016   Procedure: Left Heart Cath and Coronary Angiography;  Surgeon: Leonie Man, MD;  Location: [facility];  Service: Cardiovascular;  Laterality: N/A;  . CORONARY ANGIOPLASTY  1991  . CORONARY ARTERY BYPASS GRAFT N/A 02/18/2016   Procedure: CORONARY ARTERY BYPASS GRAFTING (CABG) times 2 using left internal mammary and right greater saphenous vein;  Surgeon: Rexene Alberts, MD;  Location: [facility];  Service: Open Heart Surgery;  Laterality: N/A;  . EP IMPLANTABLE DEVICE N/A 06/16/2016   Procedure: ICD Implant;  Surgeon:  Meredith Leeds, MD;  Location: [facility];  Service: Cardiovascular;  Laterality: N/A;  . RENAL ARTERY STENT Right 2005  . RHINOPLASTY Bilateral 1968  . TONSILLECTOMY AND ADENOIDECTOMY  1950s     Current Outpatient Medications  Medication Sig Dispense Refill  . aspirin 81 MG tablet Take 81 mg by mouth daily.    . calcium-vitamin D (OSCAL WITH D) 500-200 MG-UNIT per tablet Take 1 tablet by mouth 2 (two) times daily.    . carboxymethylcellulose (REFRESH PLUS) 0.5 % SOLN Place 2 drops into both eyes daily as needed (for dry eyes).     Marland Kitchen Co-Enzyme  Q10 200 MG CAPS Take 200 mg by mouth daily.     Marland Kitchen ENTRESTO 24-26 MG TAKE 1 TABLET TWICE A DAY 180 tablet 2  . ezetimibe (ZETIA) 10 MG tablet Take 10 mg by mouth daily.    . furosemide (LASIX) 40 MG tablet Take 1 tablet (40 mg total) by mouth as needed. 90 tablet 3  . Magnesium 500 MG TABS Take 500 mg by mouth daily.     . metoprolol succinate (TOPROL-XL) 25 MG 24 hr tablet Take 1 tablet (25 mg total) by mouth daily. 90 tablet 3  . Multiple Vitamin (MULTIVITAMIN) tablet  Take 1 tablet by mouth daily.    Marland Kitchen NITROSTAT 0.4 MG SL tablet Place 0.4 mg under the tongue every 5 (five) minutes as needed for chest pain.     . rosuvastatin (CRESTOR) 40 MG tablet TAKE 1 TABLET DAILY 90 tablet 4  . umeclidinium-vilanterol (ANORO ELLIPTA) 62.5-25 MCG/INH AEPB Inhale 1 puff into the lungs daily.    . isosorbide mononitrate (IMDUR) 30 MG 24 hr tablet Take 1 tablet (30 mg total) by mouth daily. 90 tablet 3   Current Facility-Administered Medications  Medication Dose Route Frequency Provider Last Rate Last Dose  . 0.9 %  sodium chloride infusion  500 mL Intravenous Once Armbruster, Carlota Raspberry, MD        Allergies:   Patient has no known allergies.   Social History:  The patient  reports that she quit smoking about 8 years ago. Her smoking use included cigarettes. She has a 40.00 pack-year smoking history. She has never used smokeless tobacco. She reports that she does not drink alcohol or use drugs.   Family History:  The patient's family history includes Cancer in her mother; Heart attack (age of onset: 7) in her brother; Heart attack (age of onset: 26) in her father.    ROS:  Please see the history of present illness.   Otherwise, review of systems is positive for none.   All other systems are reviewed and negative.   PHYSICAL EXAM: VS:  BP 100/60   Pulse 64   Ht 5\' 6"  (1.676 m)   Wt 176 lb 6.4 oz (80 kg)   LMP  (LMP Unknown)   SpO2 97%   BMI 28.47 kg/m  , BMI Body mass index is 28.47 kg/m. GEN: Well nourished, well developed, in no acute distress  HEENT: normal  Neck: no JVD, carotid bruits, or masses Cardiac: RRR; no murmurs, rubs, or gallops,no edema  Respiratory:  clear to auscultation bilaterally, normal work of breathing GI: soft, nontender, nondistended, + BS MS: no deformity or atrophy  Skin: warm and dry, device site well healed Neuro:  Strength and sensation are intact Psych: euthymic mood, full affect  EKG:  EKG is ordered today. Personal review of  the ekg ordered shows sinus rhythm, left bundle branch block, rate 64, QRS duration 138 ms  Personal review of the device interrogation today. Results in [facility]: 07/19/2018: BUN 19; Creatinine, Ser 0.93; Potassium 4.7; Sodium 141    Lipid Panel  No results found for: CHOL, TRIG, HDL, CHOLHDL, VLDL, LDLCALC, LDLDIRECT   Wt Readings from Last 3 Encounters:  07/08/19 176 lb 6.4 oz (80 kg)  05/08/19 171 lb (77.6 kg)  04/23/19 172 lb (78 kg)      Other studies Reviewed: Additional studies/ records that were reviewed today include:  TTE 05/20/16 - Left ventricle: The cavity size was mildly dilated. Wall   thickness  was normal. Systolic function was severely reduced. The   estimated ejection fraction was in the range of 25% to 30%. There   is akinesis of the anteroseptal and apical myocardium. Features   are consistent with a pseudonormal left ventricular filling   pattern, with concomitant abnormal relaxation and increased   filling pressure (grade 2 diastolic dysfunction). - Aortic valve: There was trivial regurgitation. - Mitral valve: Calcified annulus. Mildly thickened leaflets .   There was mild regurgitation. - Left atrium: The atrium was moderately dilated.   ASSESSMENT AND PLAN:  1.  Ischemic cardiomyopathy: Status post Saint Jude dual-chamber ICD implanted 06/16/2016.  No ventricular arrhythmias noted.  Currently on optimal medical therapy.  No changes.     2. CAD status post CABG: No current angina.  Continue management.  3. Hyperlipidemia: Continue statin  4.  Left bundle branch block: New since I saw her 1 year ago.  She is having minimal symptoms of heart failure.  Would continue with current management.  If she does develop heart failure symptoms, device upgrade may be warranted.  Current medicines are reviewed at length with the patient today.   The patient does not have concerns regarding her medicines.  The following changes were made today: none   Labs/ tests ordered today include:  Orders Placed This Encounter  Procedures  . EKG 12-Lead   Case discussed with primary cardiology  Disposition:   FU with   with 12 months  Signed,  Meredith Leeds, MD  07/08/2019 3:45 PM     [facility] [address] [place] [address] [phone] (office) [phone] (fax)

## 2019-07-09 DIAGNOSIS — H02822 Cysts of right lower eyelid: Secondary | ICD-10-CM | POA: Diagnosis not present

## 2019-07-09 DIAGNOSIS — H04123 Dry eye syndrome of bilateral lacrimal glands: Secondary | ICD-10-CM | POA: Diagnosis not present

## 2019-07-19 NOTE — Progress Notes (Signed)
No ICM remote transmission received for 07/16/2019 and next ICM transmission scheduled for 08/27/2019.  Patient had office defib check on 8/17 with Dr Curt Bears.

## 2019-08-02 DIAGNOSIS — I10 Essential (primary) hypertension: Secondary | ICD-10-CM | POA: Diagnosis not present

## 2019-08-02 DIAGNOSIS — R739 Hyperglycemia, unspecified: Secondary | ICD-10-CM | POA: Diagnosis not present

## 2019-08-02 DIAGNOSIS — Z9861 Coronary angioplasty status: Secondary | ICD-10-CM | POA: Diagnosis not present

## 2019-08-02 DIAGNOSIS — J439 Emphysema, unspecified: Secondary | ICD-10-CM | POA: Diagnosis not present

## 2019-08-02 DIAGNOSIS — Z23 Encounter for immunization: Secondary | ICD-10-CM | POA: Diagnosis not present

## 2019-08-26 ENCOUNTER — Telehealth: Payer: Self-pay

## 2019-08-26 NOTE — Telephone Encounter (Signed)
My chart message sent to patient advising remote transmission rescheduled for 09/03/2019.

## 2019-08-26 NOTE — Telephone Encounter (Signed)
Received voice mail message from patient requesting ICM remote transmission be rescheduled to next week.  Transmission rescheduled to 09/03/2019.

## 2019-08-30 ENCOUNTER — Ambulatory Visit (INDEPENDENT_AMBULATORY_CARE_PROVIDER_SITE_OTHER): Payer: Medicare Other | Admitting: *Deleted

## 2019-08-30 DIAGNOSIS — I5022 Chronic systolic (congestive) heart failure: Secondary | ICD-10-CM

## 2019-08-30 DIAGNOSIS — I255 Ischemic cardiomyopathy: Secondary | ICD-10-CM | POA: Diagnosis not present

## 2019-08-30 LAB — CUP PACEART REMOTE DEVICE CHECK
Battery Remaining Longevity: 63 mo
Battery Remaining Percentage: 65 %
Battery Voltage: 2.95 V
Brady Statistic AP VP Percent: 1 %
Brady Statistic AP VS Percent: 1.4 %
Brady Statistic AS VP Percent: 0 %
Brady Statistic AS VS Percent: 98 %
Brady Statistic RA Percent Paced: 1 %
Brady Statistic RV Percent Paced: 1 %
Date Time Interrogation Session: 20201009031458
HighPow Impedance: 75 Ohm
HighPow Impedance: 75 Ohm
Implantable Lead Implant Date: 20170727
Implantable Lead Implant Date: 20170727
Implantable Lead Location: 753859
Implantable Lead Location: 753860
Implantable Pulse Generator Implant Date: 20170727
Lead Channel Impedance Value: 390 Ohm
Lead Channel Impedance Value: 390 Ohm
Lead Channel Pacing Threshold Amplitude: 0.5 V
Lead Channel Pacing Threshold Amplitude: 1.25 V
Lead Channel Pacing Threshold Pulse Width: 0.5 ms
Lead Channel Pacing Threshold Pulse Width: 0.5 ms
Lead Channel Sensing Intrinsic Amplitude: 12 mV
Lead Channel Sensing Intrinsic Amplitude: 4.3 mV
Lead Channel Setting Pacing Amplitude: 2 V
Lead Channel Setting Pacing Amplitude: 2.5 V
Lead Channel Setting Pacing Pulse Width: 0.5 ms
Lead Channel Setting Sensing Sensitivity: 0.5 mV
Pulse Gen Serial Number: 7261095

## 2019-09-03 ENCOUNTER — Ambulatory Visit (INDEPENDENT_AMBULATORY_CARE_PROVIDER_SITE_OTHER): Payer: Medicare Other

## 2019-09-03 DIAGNOSIS — Z95810 Presence of automatic (implantable) cardiac defibrillator: Secondary | ICD-10-CM | POA: Diagnosis not present

## 2019-09-03 DIAGNOSIS — I5022 Chronic systolic (congestive) heart failure: Secondary | ICD-10-CM

## 2019-09-06 ENCOUNTER — Telehealth: Payer: Self-pay

## 2019-09-06 NOTE — Telephone Encounter (Signed)
Remote ICM transmission received.  Attempted call to patient regarding ICM remote transmission and left detailed message per DPR.  Advised to return call for any fluid symptoms or questions. Next ICM remote transmission scheduled 10/07/2019.

## 2019-09-06 NOTE — Progress Notes (Signed)
EPIC Encounter for ICM Monitoring  Patient Name: Hailey Wright is a 73 y.o. female Date: 09/06/2019 Primary Care Physican: Leanna Battles, MD Primary Cardiologist: Hochrein Electrophysiologist: Curt Bears LastWeight:164lbs 07/08/2019 Weight: 176 lbs   Attempted call to patient and unable to reach.  Left detailed message per DPR regarding transmission. Transmission reviewed.   Corvue thoracic impedance suggesting possible dryness since 08/29/2019 but starting to trend toward baseline.    Prescribed:Furosemide 40 mg 1 tablet daily as needed.    Labs: 07/19/2018 Creatinine 0.93, BUN 19, Potassium 4.7, Sodium 141, GFR 62-72  Recommendations: Left voice mail with ICM number and encouraged to call if experiencing any fluid symptoms.  Follow-up plan: ICM clinic phone appointment on 10/07/2019.   91 day device clinic remote transmission 11/25/2019.    Copy of ICM check sent to Dr. Curt Bears.   3 month ICM trend: 09/03/2019    1 Year ICM trend:       [name], RN 09/06/2019 12:22 PM

## 2019-09-10 NOTE — Progress Notes (Signed)
Remote ICD transmission.   

## 2019-10-03 DIAGNOSIS — Z1231 Encounter for screening mammogram for malignant neoplasm of breast: Secondary | ICD-10-CM | POA: Diagnosis not present

## 2019-10-07 ENCOUNTER — Ambulatory Visit (INDEPENDENT_AMBULATORY_CARE_PROVIDER_SITE_OTHER): Payer: Medicare Other

## 2019-10-07 DIAGNOSIS — I5022 Chronic systolic (congestive) heart failure: Secondary | ICD-10-CM

## 2019-10-07 DIAGNOSIS — Z95810 Presence of automatic (implantable) cardiac defibrillator: Secondary | ICD-10-CM

## 2019-10-09 NOTE — Progress Notes (Signed)
EPIC Encounter for ICM Monitoring  Patient Name: Hailey Wright is a 73 y.o. female Date: 10/09/2019 Primary Care Physican: Leanna Battles, MD Primary Cardiologist: Hochrein Electrophysiologist: Curt Bears 10/09/2019 Weight: 169 lbs  Spoke with patient and she is asymptomatic for fluid accumulation during decreased impedance.  Corvue thoracic impedance normal but suggesting possible fluid accumulation since 11/6 - 11/14.    Prescribed:Furosemide 40 mg 1 tablet daily as needed.    Labs: 07/19/2018 Creatinine 0.93, BUN 19, Potassium 4.7, Sodium 141, GFR 62-72  Recommendations: Reinforced limiting salt intake to < 2000 mg daily.  Encouraged to call if experiencing fluid symptoms.  Follow-up plan: ICM clinic phone appointment on 11/11/2019.   91 day device clinic remote transmission 11/29/2019.    Copy of ICM check sent to Dr. Curt Bears.   3 month ICM trend: 10/07/2019    1 Year ICM trend:       [name], RN 10/09/2019 1:55 PM

## 2019-11-11 ENCOUNTER — Ambulatory Visit (INDEPENDENT_AMBULATORY_CARE_PROVIDER_SITE_OTHER): Payer: Medicare Other

## 2019-11-11 DIAGNOSIS — I5022 Chronic systolic (congestive) heart failure: Secondary | ICD-10-CM | POA: Diagnosis not present

## 2019-11-11 DIAGNOSIS — Z95810 Presence of automatic (implantable) cardiac defibrillator: Secondary | ICD-10-CM

## 2019-11-12 NOTE — Progress Notes (Signed)
EPIC Encounter for ICM Monitoring  Patient Name: Hailey Wright is a 73 y.o. female Date: 11/12/2019 Primary Care Physican: Leanna Battles, MD Primary Cardiologist: Hochrein Electrophysiologist: Curt Bears 11/12/2019 Weight: 169lbs  Spoke with patient and she is asymptomatic for fluid accumulation during decreased impedance.  Corvue thoracic impedancenormal but suggesting possible fluid accumulation since 12/5 - 12/10.  Prescribed:Furosemide 40 mg 1 tablet daily as needed.    Labs: 07/19/2018 Creatinine 0.93, BUN 19, Potassium 4.7, Sodium 141, GFR 62-72  Recommendations: Reinforced limiting salt intake to < 2000 mg daily.  Encouraged to call if experiencing fluid symptoms.  Follow-up plan: ICM clinic phone appointment on 12/16/2019.   91 day device clinic remote transmission 11/29/2019.    Copy of ICM check sent to Dr. Curt Bears.    3 month ICM trend: 11/11/2019    1 Year ICM trend:       [name], RN 11/12/2019 9:50 AM

## 2019-11-13 MED ORDER — FUROSEMIDE 40 MG PO TABS: 40.0000 mg | ORAL_TABLET | ORAL | 3 refills | Status: DC | PRN

## 2019-11-13 NOTE — Telephone Encounter (Signed)
Spoke to Hailey Wright via telephone, prescription for PRN Lasix called into local [facility]. Patient to call office with any other questions, requests or concerns.

## 2019-11-27 ENCOUNTER — Ambulatory Visit
Admission: RE | Admit: 2019-11-27 | Discharge: 2019-11-27 | Disposition: A | Discharge status: Home / Self Care | Payer: Medicare Other | Source: Ambulatory Visit | Attending: Acute Care | Admitting: Acute Care

## 2019-11-27 DIAGNOSIS — Z122 Encounter for screening for malignant neoplasm of respiratory organs: Secondary | ICD-10-CM

## 2019-11-27 DIAGNOSIS — Z87891 Personal history of nicotine dependence: Secondary | ICD-10-CM

## 2019-11-29 ENCOUNTER — Ambulatory Visit (INDEPENDENT_AMBULATORY_CARE_PROVIDER_SITE_OTHER): Payer: Medicare Other | Admitting: *Deleted

## 2019-11-29 DIAGNOSIS — I255 Ischemic cardiomyopathy: Secondary | ICD-10-CM

## 2019-11-29 LAB — CUP PACEART REMOTE DEVICE CHECK
Battery Remaining Longevity: 61 mo
Battery Remaining Percentage: 63 %
Battery Voltage: 2.95 V
Brady Statistic AP VP Percent: 1 %
Brady Statistic AP VS Percent: 1.3 %
Brady Statistic AS VP Percent: 1 %
Brady Statistic AS VS Percent: 98 %
Brady Statistic RA Percent Paced: 1 %
Brady Statistic RV Percent Paced: 1 %
Date Time Interrogation Session: 20210108020016
HighPow Impedance: 77 Ohm
HighPow Impedance: 77 Ohm
Implantable Lead Implant Date: 20170727
Implantable Lead Implant Date: 20170727
Implantable Lead Location: 753859
Implantable Lead Location: 753860
Implantable Pulse Generator Implant Date: 20170727
Lead Channel Impedance Value: 390 Ohm
Lead Channel Impedance Value: 390 Ohm
Lead Channel Pacing Threshold Amplitude: 0.5 V
Lead Channel Pacing Threshold Amplitude: 1.25 V
Lead Channel Pacing Threshold Pulse Width: 0.5 ms
Lead Channel Pacing Threshold Pulse Width: 0.5 ms
Lead Channel Sensing Intrinsic Amplitude: 12 mV
Lead Channel Sensing Intrinsic Amplitude: 3.9 mV
Lead Channel Setting Pacing Amplitude: 2 V
Lead Channel Setting Pacing Amplitude: 2.5 V
Lead Channel Setting Pacing Pulse Width: 0.5 ms
Lead Channel Setting Sensing Sensitivity: 0.5 mV
Pulse Gen Serial Number: 7261095

## 2019-12-02 ENCOUNTER — Other Ambulatory Visit: Payer: Self-pay | Admitting: *Deleted

## 2019-12-02 DIAGNOSIS — I10 Essential (primary) hypertension: Secondary | ICD-10-CM | POA: Diagnosis not present

## 2019-12-02 DIAGNOSIS — Z87891 Personal history of nicotine dependence: Secondary | ICD-10-CM

## 2019-12-05 DIAGNOSIS — R82998 Other abnormal findings in urine: Secondary | ICD-10-CM | POA: Diagnosis not present

## 2019-12-05 DIAGNOSIS — I10 Essential (primary) hypertension: Secondary | ICD-10-CM | POA: Diagnosis not present

## 2019-12-05 DIAGNOSIS — Z1212 Encounter for screening for malignant neoplasm of rectum: Secondary | ICD-10-CM | POA: Diagnosis not present

## 2019-12-05 LAB — IFOBT (OCCULT BLOOD): IFOBT: NEGATIVE

## 2019-12-09 DIAGNOSIS — Z9861 Coronary angioplasty status: Secondary | ICD-10-CM | POA: Diagnosis not present

## 2019-12-09 DIAGNOSIS — Z1331 Encounter for screening for depression: Secondary | ICD-10-CM | POA: Diagnosis not present

## 2019-12-09 DIAGNOSIS — K219 Gastro-esophageal reflux disease without esophagitis: Secondary | ICD-10-CM | POA: Diagnosis not present

## 2019-12-09 DIAGNOSIS — I255 Ischemic cardiomyopathy: Secondary | ICD-10-CM | POA: Diagnosis not present

## 2019-12-09 DIAGNOSIS — I10 Essential (primary) hypertension: Secondary | ICD-10-CM | POA: Diagnosis not present

## 2019-12-09 DIAGNOSIS — R0600 Dyspnea, unspecified: Secondary | ICD-10-CM | POA: Diagnosis not present

## 2019-12-09 DIAGNOSIS — E785 Hyperlipidemia, unspecified: Secondary | ICD-10-CM | POA: Diagnosis not present

## 2019-12-09 DIAGNOSIS — Z1339 Encounter for screening examination for other mental health and behavioral disorders: Secondary | ICD-10-CM | POA: Diagnosis not present

## 2019-12-09 DIAGNOSIS — Z0000 Encounter for general adult medical examination without abnormal findings: Secondary | ICD-10-CM | POA: Diagnosis not present

## 2019-12-09 DIAGNOSIS — R739 Hyperglycemia, unspecified: Secondary | ICD-10-CM | POA: Diagnosis not present

## 2019-12-09 DIAGNOSIS — J439 Emphysema, unspecified: Secondary | ICD-10-CM | POA: Diagnosis not present

## 2019-12-16 ENCOUNTER — Ambulatory Visit (INDEPENDENT_AMBULATORY_CARE_PROVIDER_SITE_OTHER): Payer: Medicare Other

## 2019-12-16 DIAGNOSIS — Z95810 Presence of automatic (implantable) cardiac defibrillator: Secondary | ICD-10-CM | POA: Diagnosis not present

## 2019-12-16 DIAGNOSIS — I5022 Chronic systolic (congestive) heart failure: Secondary | ICD-10-CM

## 2019-12-18 NOTE — Progress Notes (Signed)
EPIC Encounter for ICM Monitoring  Patient Name: Hailey Wright is a 74 y.o. female Date: 12/18/2019 Primary Care Physican: Leanna Battles, MD Primary Cardiologist: Hochrein Electrophysiologist: Curt Bears 12/22/2020Weight: [phone]  Spoke with patient and she is asymptomatic for fluid accumulation during decreased impedance.  Corvue thoracic impedancenormal butsuggesting possiblefluid accumulationsince 12/04/19 - 12/14/19.  Prescribed:Furosemide 40 mg 1 tablet daily as needed.    Labs: 07/19/2018 Creatinine 0.93, BUN 19, Potassium 4.7, Sodium 141, GFR 62-72  Recommendations:Reinforced limiting salt intake to <2000 mg daily. Encouraged to call if experiencing fluid symptoms.  Follow-up plan: ICM clinic phone appointment on3/11/2019. 91 day device clinic remote transmission 02/28/2020.   Copy of ICM check sent to [facility].    3 month ICM trend: 12/16/2019    1 Year ICM trend:       [name], RN 12/18/2019 3:45 PM

## 2019-12-25 ENCOUNTER — Other Ambulatory Visit: Payer: Self-pay | Admitting: Cardiology

## 2020-01-20 ENCOUNTER — Ambulatory Visit (INDEPENDENT_AMBULATORY_CARE_PROVIDER_SITE_OTHER): Payer: Medicare Other

## 2020-01-20 DIAGNOSIS — I5022 Chronic systolic (congestive) heart failure: Secondary | ICD-10-CM

## 2020-01-20 DIAGNOSIS — Z95810 Presence of automatic (implantable) cardiac defibrillator: Secondary | ICD-10-CM | POA: Diagnosis not present

## 2020-01-21 ENCOUNTER — Telehealth: Payer: Self-pay

## 2020-01-21 NOTE — Telephone Encounter (Signed)
Left message for patient to remind of missed remote transmission.  

## 2020-01-22 ENCOUNTER — Telehealth: Payer: Self-pay

## 2020-01-22 NOTE — Telephone Encounter (Signed)
Remote ICM transmission received.  Attempted call to patient regarding ICM remote transmission and mail box is full. 

## 2020-01-22 NOTE — Progress Notes (Signed)
EPIC Encounter for ICM Monitoring  Patient Name: Cloee Honegger is a 74 y.o. female Date: 01/22/2020 Primary Care Physican: Leanna Battles, MD Primary Cardiologist: Hochrein Electrophysiologist: Curt Bears 12/22/2020Weight: 169lbs  Attempted call to patient and unable to reach.  Transmission reviewed.   Corvue thoracic impedancenormal butsuggesting possiblefluid accumulationsince 2/21-2/26.  Prescribed:Furosemide 40 mg 1 tablet daily as needed.    Labs: 07/19/2018 Creatinine 0.93, BUN 19, Potassium 4.7, Sodium 141, GFR 62-72  Recommendations: Unable to reach.    Follow-up plan: ICM clinic phone appointment on4/13/2021. 91 day device clinic remote transmission 03/02/2020.   Copy of ICM check sent to [facility].  3 month ICM trend: 3/12021     [name], RN 01/22/2020 1:58 PM

## 2020-01-22 NOTE — Progress Notes (Signed)
Spoke with patient.  She reports her husband has lung cancer and starting chemo this week follow surgery he had a couple of weeks ago.  He has some lymph node involvement although the margins were clear at the time of surgery.  She has been eating differently due to family and friends have been bringing food to the home for the last few weeks. Current weight is 174 lbs and she has gained about 5 lbs.  Encouraged to take PRN Furosemide since weight is up and report suggests she has been having some fluid accumulation in the last couple of weeks.  Encouraged to call if she has any changes in condition.

## 2020-01-28 ENCOUNTER — Other Ambulatory Visit: Payer: Self-pay | Admitting: Cardiology

## 2020-02-22 ENCOUNTER — Other Ambulatory Visit: Payer: Self-pay | Admitting: Cardiology

## 2020-03-02 ENCOUNTER — Ambulatory Visit (INDEPENDENT_AMBULATORY_CARE_PROVIDER_SITE_OTHER): Payer: Medicare Other | Admitting: *Deleted

## 2020-03-02 DIAGNOSIS — I255 Ischemic cardiomyopathy: Secondary | ICD-10-CM | POA: Diagnosis not present

## 2020-03-02 LAB — CUP PACEART REMOTE DEVICE CHECK
Battery Remaining Longevity: 58 mo
Battery Remaining Percentage: 60 %
Battery Voltage: 2.93 V
Brady Statistic AP VP Percent: 1 %
Brady Statistic AP VS Percent: 1 %
Brady Statistic AS VP Percent: 1 %
Brady Statistic AS VS Percent: 99 %
Brady Statistic RA Percent Paced: 1 %
Brady Statistic RV Percent Paced: 1 %
Date Time Interrogation Session: 20210412020016
HighPow Impedance: 84 Ohm
HighPow Impedance: 84 Ohm
Implantable Lead Implant Date: 20170727
Implantable Lead Implant Date: 20170727
Implantable Lead Location: 753859
Implantable Lead Location: 753860
Implantable Pulse Generator Implant Date: 20170727
Lead Channel Impedance Value: 390 Ohm
Lead Channel Impedance Value: 400 Ohm
Lead Channel Pacing Threshold Amplitude: 0.5 V
Lead Channel Pacing Threshold Amplitude: 1.25 V
Lead Channel Pacing Threshold Pulse Width: 0.5 ms
Lead Channel Pacing Threshold Pulse Width: 0.5 ms
Lead Channel Sensing Intrinsic Amplitude: 12 mV
Lead Channel Sensing Intrinsic Amplitude: 4 mV
Lead Channel Setting Pacing Amplitude: 2 V
Lead Channel Setting Pacing Amplitude: 2.5 V
Lead Channel Setting Pacing Pulse Width: 0.5 ms
Lead Channel Setting Sensing Sensitivity: 0.5 mV
Pulse Gen Serial Number: 7261095

## 2020-03-03 ENCOUNTER — Other Ambulatory Visit: Payer: Self-pay | Admitting: Cardiology

## 2020-03-03 ENCOUNTER — Ambulatory Visit (INDEPENDENT_AMBULATORY_CARE_PROVIDER_SITE_OTHER): Payer: Medicare Other

## 2020-03-03 ENCOUNTER — Telehealth: Payer: Self-pay

## 2020-03-03 DIAGNOSIS — Z95810 Presence of automatic (implantable) cardiac defibrillator: Secondary | ICD-10-CM

## 2020-03-03 DIAGNOSIS — I5022 Chronic systolic (congestive) heart failure: Secondary | ICD-10-CM

## 2020-03-03 NOTE — Progress Notes (Signed)
EPIC Encounter for ICM Monitoring  Patient Name: Hailey Wright is a 74 y.o. female Date: 03/03/2020 Primary Care Physican: Leanna Battles, MD Primary Cardiologist: Hochrein Electrophysiologist: Curt Bears 3/1/2021Weight: 174lbs  Attempted call to patient and unable to reach.  Left detailed message per DPR regarding transmission. Transmission reviewed.    Husband has lung cancer.  Corvue thoracic impedancesuggesting possibleongoing fluid accumulationsince 02/29/20.  Prescribed:Furosemide 40 mg 1 tablet daily as needed.    Labs: 07/19/2018 Creatinine 0.93, BUN 19, Potassium 4.7, Sodium 141, GFR 62-72  Recommendations:Left voice mail with ICM number and encouraged to call if experiencing any fluid symptoms.  Will advise patient to take PRN Furosemide if reached.  Follow-up plan: ICM clinic phone appointment on4/16/2021 (manual send) to recheck fluid levels. 91 day device clinic remote transmission7/10/2020.   Copy of ICM check sent to [facility] and Dr Percival Spanish.  3 month ICM trend: 03/03/2020    1 Year ICM trend:       [name], RN 03/03/2020 8:54 AM

## 2020-03-03 NOTE — Telephone Encounter (Signed)
Remote ICM transmission received.  Attempted call to patient regarding ICM remote transmission and left detailed  message per DPR to return call.  Advised to return call for any fluid symptoms or questions. Next ICM remote transmission scheduled 03/06/2020.

## 2020-03-03 NOTE — Progress Notes (Signed)
ICD Remote  

## 2020-03-06 NOTE — Progress Notes (Signed)
ICM remote transmission fluid level recheck changed to 03/11/2020.

## 2020-03-09 ENCOUNTER — Ambulatory Visit (INDEPENDENT_AMBULATORY_CARE_PROVIDER_SITE_OTHER): Payer: Medicare Other

## 2020-03-09 DIAGNOSIS — Z95810 Presence of automatic (implantable) cardiac defibrillator: Secondary | ICD-10-CM

## 2020-03-09 DIAGNOSIS — I5022 Chronic systolic (congestive) heart failure: Secondary | ICD-10-CM

## 2020-03-09 NOTE — Progress Notes (Signed)
EPIC Encounter for ICM Monitoring  Patient Name: Hailey Wright is a 74 y.o. female Date: 03/09/2020 Primary Care Physican: Leanna Battles, MD Primary Cardiologist: Hochrein Electrophysiologist: Curt Bears 3/1/2021Weight: 174lbs  Received call from patient.  She reports she has been taking PRN Furosemide since 03/03/20 remote transmission.  She had leg cramping last night and BP was low over the weekend but today is 100/?Marland Kitchen  She requested to review a transmission today since she has been taking Furosemide for several days.  Husband has lung cancer.  Corvue thoracic impedance returned to normal since taking PRN furosemide.  Prescribed:Furosemide 40 mg 1 tablet daily as needed.    Labs: 07/19/2018 Creatinine 0.93, BUN 19, Potassium 4.7, Sodium 141, GFR 62-72  Recommendations: Advised drink approximately 64 -72 oz fluid today to stay hydrated and to eat food high in potassium today such as bananas.  Explained she may have cramps due to dryness or furosemide may have decreased potassium level.  She feels fines today.    Follow-up plan: ICM clinic phone appointment on 04/06/2020. 91 day device clinic remote transmission7/10/2020. She will message Dr Rosezella [place] office to schedule an appointment since her last visit was 12/2018.  Copy of ICM check sent to [facility] and Dr Percival Spanish.  3 month ICM trend: 03/09/2020    1 Year ICM trend:       [name], RN 03/09/2020 10:40 AM

## 2020-03-10 ENCOUNTER — Other Ambulatory Visit: Payer: Self-pay | Admitting: Cardiology

## 2020-03-12 DIAGNOSIS — Z7189 Other specified counseling: Secondary | ICD-10-CM | POA: Insufficient documentation

## 2020-03-12 DIAGNOSIS — Z95810 Presence of automatic (implantable) cardiac defibrillator: Secondary | ICD-10-CM | POA: Insufficient documentation

## 2020-03-12 NOTE — Progress Notes (Signed)
Cardiology Office Note   Date:  03/13/2020   ID:  Hailey Wright, DOB 1945/12/19, MRN [id number]  PCP:  Leanna Battles, MD  Cardiologist:   Minus Breeding, MD   Chief Complaint  Patient presents with  . Follow-up  . Headache  . Shortness of Breath     History of Present Illness: Hailey Wright is a 74 y.o. female who presents for follow of CAD and ischemic cardiomyopathy.  She has a history of known coronary disease as previously described. She had a negative stress perfusion study in the fall of 2016.  However, she came back with symptoms of acute onset chest pain. The plan was an elective cardiac catheterization. However, prior to that presented with an acute anterior myocardial infarction. She had occlusion of the LAD with acute thrombus in the circumflex. She did have chronic occlusion of her right coronary artery. She was placed on intra-aortic balloon pump and had emergent CABG with a LIMA to the LAD and SVG to OM1. She is left with an ejection fraction of approximately 30%. She had an ICD placed.   Her EF improved on echo in July 2018 to 35%. EF was 35 - 40% in July of last year.  I have been titrating meds.  At the last visit I was not able to titrate meds because of low BPs.    Recently she had increased dyspnea and had diuresis followed on Optivol and probably had slight over diuresis.  She returns for follow up.  She has not seen me in a while.  Her husband has lung cancer and there working through chemotherapy right now.  She was noted to have some fluid accumulation on OptiVol recently when she had device interrogation.  She has not been taking her Lasix.  She was given 40 mg for about 4 days but she got really lightheaded and developed some cramping so she backed off.  In retrospect she thinks she did have a little fluid and that she had some swelling of her hands.  She might of had a little cough.  That did go away.  She has been watching her salt.  She watches her fluid.  She  has been exercising walking with her husband despite his chemotherapy.  She denies any chest pressure, neck or arm discomfort.  She has had no PND or orthopnea.  She is had no palpitations, presyncope or syncope.  Of note I did review her tracings Optivol for this for this visit.  I also reviewed labs from her primary provider.  Past Medical History:  Diagnosis Date  . Acute ST elevation myocardial infarction (STEMI) involving left anterior descending coronary artery ([place]) 02/18/2016  . AICD (automatic cardioverter/defibrillator) present   . CAD (coronary artery disease) 06/04/2013  . CHF (congestive heart failure) ([place])   . Clotting disorder ([place])   . COPD (chronic obstructive pulmonary disease) ([place])   . Coronary artery thrombosis ([place]) 02/18/2016  . DVT (deep venous thrombosis) ([place]) 1980s   RLE  . Hepatitis A infection 1987  . Hyperlipidemia   . Hypertension   . Ischemic cardiomyopathy    a. Echo 5/17: EF 25-30%, anteroseptal, anterior, anteroseptal and apical akinesis, possible inferior hypokinesis, grade 1 diastolic dysfunction, no evidence of thrombus, trivial AI, mild MR, mild LAE  . MI (myocardial infarction) ([place]) 1991   Inferior treated with TPA 1991, POBA RCA 1996  . Pneumonia 1980s X 1  . RAS (renal artery stenosis) ([facility])    Right renal stent  2005  . S/P Emergency CABG x 2 02/18/2016   LIMA to LAD, SVG to OM1, EVH via right thigh  . Type II diabetes mellitus ([place])    pt denies    Past Surgical History:  Procedure Laterality Date  . CARDIAC CATHETERIZATION N/A 02/18/2016   Procedure: Left Heart Cath and Coronary Angiography;  Surgeon: Leonie Man, MD;  Location: [facility];  Service: Cardiovascular;  Laterality: N/A;  . CORONARY ANGIOPLASTY  1991  . CORONARY ARTERY BYPASS GRAFT N/A 02/18/2016   Procedure: CORONARY ARTERY BYPASS GRAFTING (CABG) times 2 using left internal mammary and right greater saphenous vein;  Surgeon: Rexene Alberts, MD;  Location: [facility];   Service: Open Heart Surgery;  Laterality: N/A;  . EP IMPLANTABLE DEVICE N/A 06/16/2016   Procedure: ICD Implant;  Surgeon: Will Meredith Leeds, MD;  Location: [facility];  Service: Cardiovascular;  Laterality: N/A;  . RENAL ARTERY STENT Right 2005  . RHINOPLASTY Bilateral 1968  . TONSILLECTOMY AND ADENOIDECTOMY  1950s     Current Outpatient Medications  Medication Sig Dispense Refill  . aspirin 81 MG tablet Take 81 mg by mouth daily.    . calcium-vitamin D (OSCAL WITH D) 500-200 MG-UNIT per tablet Take 1 tablet by mouth 2 (two) times daily.    . carboxymethylcellulose (REFRESH PLUS) 0.5 % SOLN Place 2 drops into both eyes daily as needed (for dry eyes).     Marland Kitchen Co-Enzyme Q10 200 MG CAPS Take 200 mg by mouth daily.     Marland Kitchen ENTRESTO 24-26 MG TAKE 1 TABLET TWICE A DAY 180 tablet 3  . ezetimibe (ZETIA) 10 MG tablet Take 10 mg by mouth daily.    . furosemide (LASIX) 40 MG tablet Take 1 tablet (40 mg total) by mouth as needed. 30 tablet 3  . isosorbide mononitrate (IMDUR) 30 MG 24 hr tablet TAKE 1 TABLET DAILY 90 tablet 3  . Magnesium 500 MG TABS Take 500 mg by mouth daily.     . metoprolol succinate (TOPROL-XL) 25 MG 24 hr tablet TAKE 1 TABLET DAILY 90 tablet 0  . Multiple Vitamin (MULTIVITAMIN) tablet Take 1 tablet by mouth daily.    Marland Kitchen NITROSTAT 0.4 MG SL tablet Place 0.4 mg under the tongue every 5 (five) minutes as needed for chest pain.     . rosuvastatin (CRESTOR) 40 MG tablet TAKE 1 TABLET DAILY (NEED OFFICE VISIT FOR FURTHER REFILLS) 30 tablet 11  . umeclidinium-vilanterol (ANORO ELLIPTA) 62.5-25 MCG/INH AEPB Inhale 1 puff into the lungs daily.     Current Facility-Administered Medications  Medication Dose Route Frequency Provider Last Rate Last Admin  . 0.9 %  sodium chloride infusion  500 mL Intravenous Once Armbruster, Carlota Raspberry, MD        Allergies:   Patient has no known allergies.    ROS:  Please see the history of present illness.   Otherwise, review of systems are  positive for none.   All other systems are reviewed and negative.    PHYSICAL EXAM: VS:  BP (!) 90/56 (BP Location: Left Arm, Patient Position: Sitting, Cuff Size: Normal)   Pulse 60   Temp (!) 97.4 F (36.3 C)   Ht 5\' 6"  (1.676 m)   Wt 172 lb (78 kg)   LMP  (LMP Unknown)   BMI 27.76 kg/m  , BMI Body mass index is 27.76 kg/m.  GENERAL:  Well appearing NECK:  No jugular venous distention, waveform within normal limits, carotid upstroke  brisk and symmetric, soft left bruits, no thyromegaly LUNGS:  Clear to auscultation bilaterally CHEST:  Well healed sternotomy scar. HEART:  PMI not displaced or sustained,S1 and S2 within normal limits, no S3, no S4, no clicks, no rubs, no murmurs ABD:  Flat, positive bowel sounds normal in frequency in pitch, no bruits, no rebound, no guarding, no midline pulsatile mass, no hepatomegaly, no splenomegaly EXT:  2 plus pulses throughout, no edema, no cyanosis no clubbing  EKG:  EKG is  ordered today. Sinus rhythm, rate 60, axis with left bundle branch block, no acute ST-T wave changes.  Recent Labs: No results found for requested labs within last 8760 hours.    Lipid Panel No results found for: CHOL, TRIG, HDL, CHOLHDL, VLDL, LDLCALC, LDLDIRECT    Wt Readings from Last 3 Encounters:  03/13/20 172 lb (78 kg)  07/08/19 176 lb 6.4 oz (80 kg)  05/08/19 171 lb (77.6 kg)       Other studies Reviewed: Additional studies/ records that were reviewed today include:  Labs, Optivol. Review of the above records demonstrates:   See   ASSESSMENT AND PLAN:   CAD -    The patient has no new sypmtoms.  No further cardiovascular testing is indicated.  We will continue with aggressive risk reduction and meds as listed.  Ischemic CM -     Her EF has been 25 to 30%.  She would not tolerate med titration with her low blood pressure.  She seems to be euvolemic and sensitive to diuretic.  We talked about taking 20 mg of Lasix as needed if she has symptoms since  she seems to be sensitive to the 40.  Otherwise no change in therapy.   HL -     LDL was 46 with an HDL of 64.  No change in therapy.   ICD -    she is up-to-date with follow-up.  BRUIT -I will check carotid Doppler.  Covid education -she has been vaccinated.   Current medicines are reviewed at length with the patient today.  The patient does not have concerns regarding medicines.  The following changes have been made:    As above Labs/ tests ordered today include:      Orders Placed This Encounter  Procedures  . Basic metabolic panel  . Magnesium  . EKG 12-Lead  . VAS US CAROTID     Disposition:   FU with with me in 6 months.   Signed, Minus Breeding, MD  03/13/2020 11:07 AM    [place]

## 2020-03-13 ENCOUNTER — Encounter: Payer: Self-pay | Admitting: Cardiology

## 2020-03-13 ENCOUNTER — Ambulatory Visit (INDEPENDENT_AMBULATORY_CARE_PROVIDER_SITE_OTHER): Payer: Medicare Other | Admitting: Cardiology

## 2020-03-13 ENCOUNTER — Other Ambulatory Visit: Payer: Self-pay

## 2020-03-13 VITALS — BP 90/56 | HR 60 | Temp 97.4°F | Ht 66.0 in | Wt 172.0 lb

## 2020-03-13 DIAGNOSIS — I255 Ischemic cardiomyopathy: Secondary | ICD-10-CM

## 2020-03-13 DIAGNOSIS — I2510 Atherosclerotic heart disease of native coronary artery without angina pectoris: Secondary | ICD-10-CM

## 2020-03-13 DIAGNOSIS — R0989 Other specified symptoms and signs involving the circulatory and respiratory systems: Secondary | ICD-10-CM

## 2020-03-13 DIAGNOSIS — Z7189 Other specified counseling: Secondary | ICD-10-CM | POA: Diagnosis not present

## 2020-03-13 DIAGNOSIS — E785 Hyperlipidemia, unspecified: Secondary | ICD-10-CM | POA: Diagnosis not present

## 2020-03-13 DIAGNOSIS — Z95810 Presence of automatic (implantable) cardiac defibrillator: Secondary | ICD-10-CM

## 2020-03-13 LAB — BASIC METABOLIC PANEL
BUN/Creatinine Ratio: 15 (ref 12–28)
BUN: 15 mg/dL (ref 8–27)
CO2: 24 mmol/L (ref 20–29)
Calcium: 10.2 mg/dL (ref 8.7–10.3)
Chloride: 104 mmol/L (ref 96–106)
Creatinine, Ser: 1.02 mg/dL — ABNORMAL HIGH (ref 0.57–1.00)
GFR calc Af Amer: 64 mL/min/{1.73_m2} (ref >59)
GFR calc non Af Amer: 55 mL/min/{1.73_m2} — ABNORMAL LOW (ref >59)
Glucose: 88 mg/dL (ref 65–99)
Potassium: 4.4 mmol/L (ref 3.5–5.2)
Sodium: 142 mmol/L (ref 134–144)

## 2020-03-13 LAB — MAGNESIUM: Magnesium: 2.3 mg/dL (ref 1.6–2.3)

## 2020-03-13 NOTE — Patient Instructions (Addendum)
Medication Instructions:  NO CHANGES *If you need a refill on your cardiac medications before your next appointment, please call your pharmacy*  Lab Work: Your physician recommends that you return for lab work TODAY (BMP, MAG)  Testing/Procedures: Your physician has requested that you have a carotid duplex. This test is an ultrasound of the carotid arteries in your neck. It looks at blood flow through these arteries that supply the brain with blood. Allow one hour for this exam. There are no restrictions or special instructions.  Follow-Up: At [facility], you and your health needs are our priority.  As part of our continuing mission to provide you with exceptional heart care, we have created designated Provider Care Teams.  These Care Teams include your primary Cardiologist (physician) and Advanced Practice Providers (APPs -  Physician Assistants and Nurse Practitioners) who all work together to provide you with the care you need, when you need it.  Your next appointment:   6 month(s)  You will receive a reminder letter in the mail two months in advance. If you don't receive a letter, please call our office to schedule the follow-up appointment.  The format for your next appointment:   In Person  Provider:   Minus Breeding, MD

## 2020-03-19 ENCOUNTER — Other Ambulatory Visit: Payer: Self-pay

## 2020-03-19 MED ORDER — ROSUVASTATIN CALCIUM 40 MG PO TABS: 40.0000 mg | ORAL_TABLET | Freq: Every day | ORAL | 1 refills | Status: AC

## 2020-03-20 ENCOUNTER — Other Ambulatory Visit: Payer: Self-pay

## 2020-03-20 ENCOUNTER — Ambulatory Visit (HOSPITAL_COMMUNITY)
Admission: RE | Admit: 2020-03-20 | Discharge: 2020-03-20 | Disposition: A | Discharge status: Home / Self Care | Payer: Medicare Other | Source: Ambulatory Visit | Attending: Cardiovascular Disease | Admitting: Cardiovascular Disease

## 2020-03-20 DIAGNOSIS — R0989 Other specified symptoms and signs involving the circulatory and respiratory systems: Secondary | ICD-10-CM | POA: Insufficient documentation

## 2020-04-06 ENCOUNTER — Ambulatory Visit (INDEPENDENT_AMBULATORY_CARE_PROVIDER_SITE_OTHER): Payer: Medicare Other

## 2020-04-06 DIAGNOSIS — I5022 Chronic systolic (congestive) heart failure: Secondary | ICD-10-CM | POA: Diagnosis not present

## 2020-04-06 DIAGNOSIS — Z95810 Presence of automatic (implantable) cardiac defibrillator: Secondary | ICD-10-CM | POA: Diagnosis not present

## 2020-04-06 NOTE — Progress Notes (Signed)
EPIC Encounter for ICM Monitoring  Patient Name: Hailey Wright is a 74 y.o. female Date: 04/06/2020 Primary Care Physican: Leanna Battles, MD Primary Cardiologist: Hochrein Electrophysiologist: Curt Bears 04/06/2020 Weight: 165 lbs  Spoke with patient.  She discussed with Dr Percival Spanish about taking only 20 mg furosemide when needed which he approved since 40 mg causes leg cramping.  Reviewed transmission and discussed limiting fluid intake to 64 oz a day.  She reports she may have been drinking too much fluids because she was not totaling all fluids in a day.  She denies any dehydration symptoms.  Husband has lung cancer.  Corvue thoracic impedance suggesting possible dryness starting 04/02/20 but was suggesting possible fluid accumulation from 4/28 - 5/10.  Prescribed:Furosemide 40 mg 1 tablet daily as needed.    Labs: Labs: 03/13/2020 Creatinine 1.02, BUN 15, Potassium 4.4, Sodium 142, GFR 55-64 A complete set of results can be found in Results Review.  Recommendations: Advised to balance fluid intake and not to exceed total of 64 oz daily. Also recommendation to limit salt intake to 2000 mg daily.  Encouraged to call if experiencing any fluid symptoms.   Follow-up plan: ICM clinic phone appointment on 05/11/2020. 91 day device clinic remote transmission7/10/2020.    Copy of ICM check sent to [facility].  3 month ICM trend: 04/06/2020    1 Year ICM trend:       [name], RN 04/06/2020 2:57 PM

## 2020-05-11 ENCOUNTER — Ambulatory Visit (INDEPENDENT_AMBULATORY_CARE_PROVIDER_SITE_OTHER): Payer: Medicare Other

## 2020-05-11 DIAGNOSIS — I5022 Chronic systolic (congestive) heart failure: Secondary | ICD-10-CM

## 2020-05-11 DIAGNOSIS — Z95810 Presence of automatic (implantable) cardiac defibrillator: Secondary | ICD-10-CM

## 2020-05-12 NOTE — Progress Notes (Signed)
EPIC Encounter for ICM Monitoring  Patient Name: Hailey Wright is a 74 y.o. female Date: 05/12/2020 Primary Care Physican: Leanna Battles, MD Primary Cardiologist: Hochrein Electrophysiologist: Curt Bears 05/12/2020 Weight: 162 lbs  Spoke with patient and reports feeling well at this time.  Denies fluid symptoms.    Corvue thoracic impedancenormal.  Prescribed:Furosemide 40 mg 1 tablet daily as needed.  She takes 0.5 tablet as needed.  Labs: Labs: 03/13/2020 Creatinine 1.02, BUN 15, Potassium 4.4, Sodium 142, GFR 55-64 A complete set of results can be found in Results Review.  Recommendations:   Encouraged to call if experiencing fluid symptoms.  Follow-up plan: ICM clinic phone appointment on 06/15/2020.   91 day device clinic remote transmission 06/02/2020.    Next office visit: 07/23/2020 with Dr Curt Bears  Copy of ICM check sent to Dr. Curt Bears.   Direct Trend Viewer through 05/09/2020     [name], RN 05/12/2020 9:13 AM

## 2020-06-01 ENCOUNTER — Ambulatory Visit (INDEPENDENT_AMBULATORY_CARE_PROVIDER_SITE_OTHER): Payer: Medicare Other | Admitting: *Deleted

## 2020-06-01 DIAGNOSIS — I255 Ischemic cardiomyopathy: Secondary | ICD-10-CM | POA: Diagnosis not present

## 2020-06-01 LAB — CUP PACEART REMOTE DEVICE CHECK
Battery Remaining Longevity: 57 mo
Battery Remaining Percentage: 59 %
Battery Voltage: 2.93 V
Brady Statistic AP VP Percent: 1 %
Brady Statistic AP VS Percent: 1.2 %
Brady Statistic AS VP Percent: 1 %
Brady Statistic AS VS Percent: 99 %
Brady Statistic RA Percent Paced: 1 %
Brady Statistic RV Percent Paced: 1 %
Date Time Interrogation Session: 20210712020021
HighPow Impedance: 73 Ohm
HighPow Impedance: 73 Ohm
Implantable Lead Implant Date: 20170727
Implantable Lead Implant Date: 20170727
Implantable Lead Location: 753859
Implantable Lead Location: 753860
Implantable Pulse Generator Implant Date: 20170727
Lead Channel Impedance Value: 380 Ohm
Lead Channel Impedance Value: 380 Ohm
Lead Channel Pacing Threshold Amplitude: 0.5 V
Lead Channel Pacing Threshold Amplitude: 1.25 V
Lead Channel Pacing Threshold Pulse Width: 0.5 ms
Lead Channel Pacing Threshold Pulse Width: 0.5 ms
Lead Channel Sensing Intrinsic Amplitude: 12 mV
Lead Channel Sensing Intrinsic Amplitude: 3.7 mV
Lead Channel Setting Pacing Amplitude: 2 V
Lead Channel Setting Pacing Amplitude: 2.5 V
Lead Channel Setting Pacing Pulse Width: 0.5 ms
Lead Channel Setting Sensing Sensitivity: 0.5 mV
Pulse Gen Serial Number: 7261095

## 2020-06-02 ENCOUNTER — Other Ambulatory Visit: Payer: Self-pay | Admitting: Cardiology

## 2020-06-02 NOTE — Progress Notes (Signed)
Remote ICD transmission.   

## 2020-06-15 ENCOUNTER — Ambulatory Visit (INDEPENDENT_AMBULATORY_CARE_PROVIDER_SITE_OTHER): Payer: Medicare Other

## 2020-06-15 DIAGNOSIS — I5022 Chronic systolic (congestive) heart failure: Secondary | ICD-10-CM | POA: Diagnosis not present

## 2020-06-15 DIAGNOSIS — Z95810 Presence of automatic (implantable) cardiac defibrillator: Secondary | ICD-10-CM | POA: Diagnosis not present

## 2020-06-16 NOTE — Progress Notes (Signed)
EPIC Encounter for ICM Monitoring  Patient Name: Hailey Wright is a 74 y.o. female Date: 06/16/2020 Primary Care Physican: Leanna Battles, MD Primary Cardiologist: Hochrein Electrophysiologist: Curt Bears 06/16/2020 Weight: 158 lbs  Spoke with patient and reports feeling well at this time.  Denies fluid symptoms.    Corvue thoracic impedancenormal.  Prescribed:Furosemide 40 mg 1 tablet daily as needed. She takes 0.5 tablet as needed since 40 mg tends to make her legs cramp.  Labs: 03/13/2020 Creatinine 1.02, BUN 15, Potassium 4.4, Sodium 142, GFR 55-64 A complete set of results can be found in Results Review.  Recommendations:   Encouraged to call if experiencing fluid symptoms.  Follow-up plan: ICM clinic phone appointment on 07/20/2020.   91 day device clinic remote transmission 08/31/2020.    EP/Cardiology Office Visits: 07/23/2020 with Dr. Curt Bears.    Copy of ICM check sent to Dr. Curt Bears.   3 month ICM trend: 06/16/2020    1 Year ICM trend:       [name], RN 06/16/2020 3:45 PM

## 2020-07-20 ENCOUNTER — Ambulatory Visit (INDEPENDENT_AMBULATORY_CARE_PROVIDER_SITE_OTHER): Payer: Medicare Other

## 2020-07-20 ENCOUNTER — Telehealth: Payer: Self-pay

## 2020-07-20 DIAGNOSIS — I5022 Chronic systolic (congestive) heart failure: Secondary | ICD-10-CM | POA: Diagnosis not present

## 2020-07-20 DIAGNOSIS — Z95810 Presence of automatic (implantable) cardiac defibrillator: Secondary | ICD-10-CM

## 2020-07-20 NOTE — Telephone Encounter (Signed)
Remote ICM transmission received.  Attempted call to patient regarding ICM remote transmission and no answer, mail box is full  

## 2020-07-20 NOTE — Progress Notes (Signed)
EPIC Encounter for ICM Monitoring  Patient Name: Hailey Wright is a 75 y.o. female Date: 07/20/2020 Primary Care Physican: Leanna Battles, MD Primary Cardiologist: Hochrein Electrophysiologist: Curt Bears 06/16/2020 Weight: 158lbs  Attempted call to patient and unable to reach.   Transmission reviewed.   Corvue thoracic impedancesuggesting possible fluid accumulation starting 07/17/2020.  Prescribed:Furosemide 40 mg 1 tablet daily as needed. She takes 0.5 tablet as needed since 40 mg tends to make her legs cramp.  Labs: 03/13/2020 Creatinine 1.02, BUN 15, Potassium 4.4, Sodium 142, GFR 55-64 A complete set of results can be found in Results Review.  Recommendations:Unable to reach.    Follow-up plan: ICM clinic phone appointment on 08/24/2020 (fluid level recheck will be 9/2 in office).   91 day device clinic remote transmission 08/31/2020.    EP/Cardiology Office Visits: 07/23/2020 with Dr. Curt Bears.    Copy of ICM check sent to Dr. Curt Bears and Dr Percival Spanish.     3 month ICM trend: 07/20/2020    1 Year ICM trend:       [name], RN 07/20/2020 9:01 AM

## 2020-07-21 NOTE — Progress Notes (Signed)
Attempted call to patient and mail box is full.

## 2020-07-21 NOTE — Progress Notes (Signed)
Received: Shelly Flatten, MD  Hailey Wright Panda, RN Agree with Lasix as you have suggested. Will you continue to try to get in touch with her? Thanks.

## 2020-07-23 ENCOUNTER — Other Ambulatory Visit: Payer: Self-pay

## 2020-07-23 ENCOUNTER — Ambulatory Visit (INDEPENDENT_AMBULATORY_CARE_PROVIDER_SITE_OTHER): Payer: Medicare Other | Admitting: Cardiology

## 2020-07-23 ENCOUNTER — Encounter: Payer: Self-pay | Admitting: Cardiology

## 2020-07-23 VITALS — BP 90/60 | HR 69 | Ht 66.0 in | Wt 157.0 lb

## 2020-07-23 DIAGNOSIS — I255 Ischemic cardiomyopathy: Secondary | ICD-10-CM

## 2020-07-23 LAB — CUP PACEART INCLINIC DEVICE CHECK
Battery Remaining Longevity: 60 mo
Brady Statistic RA Percent Paced: 0.89 %
Brady Statistic RV Percent Paced: 0 %
Date Time Interrogation Session: 20210902162301
HighPow Impedance: 86.625
Implantable Lead Implant Date: 20170727
Implantable Lead Implant Date: 20170727
Implantable Lead Location: 753859
Implantable Lead Location: 753860
Implantable Pulse Generator Implant Date: 20170727
Lead Channel Impedance Value: 412.5 Ohm
Lead Channel Impedance Value: 475 Ohm
Lead Channel Pacing Threshold Amplitude: 0.5 V
Lead Channel Pacing Threshold Amplitude: 1 V
Lead Channel Pacing Threshold Pulse Width: 0.5 ms
Lead Channel Pacing Threshold Pulse Width: 0.5 ms
Lead Channel Sensing Intrinsic Amplitude: 12 mV
Lead Channel Sensing Intrinsic Amplitude: 5 mV
Lead Channel Setting Pacing Amplitude: 2 V
Lead Channel Setting Pacing Amplitude: 2.5 V
Lead Channel Setting Pacing Pulse Width: 0.5 ms
Lead Channel Setting Sensing Sensitivity: 0.5 mV
Pulse Gen Serial Number: 7261095

## 2020-07-23 NOTE — Patient Instructions (Addendum)
Medication Instructions:  Your physician recommends that you continue on your current medications as directed. Please refer to the Current Medication list given to you today.  *If you need a refill on your cardiac medications before your next appointment, please call your pharmacy*   Lab Work: None ordered If you have labs (blood work) drawn today and your tests are completely normal, you will receive your results only by: Marland Kitchen MyChart Message (if you have MyChart) OR . A paper copy in the mail If you have any lab test that is abnormal or we need to change your treatment, we will call you to review the results.   Testing/Procedures: None ordered   Follow-Up: Remote monitoring is used to monitor your Pacemaker of ICD from home. This monitoring reduces the number of office visits required to check your device to one time per year. It allows [place] to keep an eye on the functioning of your device to ensure it is working properly. You are scheduled for a device check from home on 08/31/2020. You may send your transmission at any time that day. If you have a wireless device, the transmission will be sent automatically. After your physician reviews your transmission, you will receive a postcard with your next transmission date.  At [facility], you and your health needs are our priority.  As part of our continuing mission to provide you with exceptional heart care, we have created designated Provider Care Teams.  These Care Teams include your primary Cardiologist (physician) and Advanced Practice Providers (APPs -  Physician Assistants and Nurse Practitioners) who all work together to provide you with the care you need, when you need it.  We recommend signing up for the patient portal called "MyChart".  Sign up information is provided on this After Visit Summary.  MyChart is used to connect with patients for Virtual Visits (Telemedicine).  Patients are able to view lab/test results, encounter notes,  upcoming appointments, etc.  Non-urgent messages can be sent to your provider as well.   To learn more about what you can do with MyChart, go to [email or web address].    Your next appointment:   1 year(s)  The format for your next appointment:   In Person  Provider:   Allegra Lai, MD   Thank you for choosing [facility]!!   Trinidad Curet, RN [phone]    Other Instructions

## 2020-07-23 NOTE — Progress Notes (Signed)
Electrophysiology Office Note   Date:  07/23/2020   ID:  Hailey Wright, DOB July 09, 1946, MRN [id number]  PCP:  Leanna Battles, MD  Cardiologist:  Hochrein Primary Electrophysiologist:  Llewellyn Schoenberger Meredith Leeds, MD    No chief complaint on file.    History of Present Illness: Hailey Wright is a 74 y.o. female who presents today for electrophysiology evaluation.   Hx CAD and ischemic cardiomyopathy. In March 2017, had emergent CABG with LIMA to the LAD and SVG to OM1. She is left with an ejection fraction of approximately 30%.  Had ICD placed 06/17/16.   Today, denies symptoms of palpitations, chest pain, shortness of breath, orthopnea, PND, lower extremity edema, claudication, dizziness, presyncope, syncope, bleeding, or neurologic sequela. The patient is tolerating medications without difficulties.  Since being seen she has felt well.  She has no chest pain or shortness of breath and is able to do all of her daily activities her husband was diagnosed with lung cancer in the winter and has now undergone resection and chemotherapy.  He is doing well.  He has a follow-up scan in September.  Aside from that she has no major complaints.   Past Medical History:  Diagnosis Date  . Acute ST elevation myocardial infarction (STEMI) involving left anterior descending coronary artery ([place]) 02/18/2016  . AICD (automatic cardioverter/defibrillator) present   . CAD (coronary artery disease) 06/04/2013  . CHF (congestive heart failure) ([place])   . Clotting disorder ([place])   . COPD (chronic obstructive pulmonary disease) ([place])   . Coronary artery thrombosis ([place]) 02/18/2016  . DVT (deep venous thrombosis) ([place]) 1980s   RLE  . Hepatitis A infection 1987  . Hyperlipidemia   . Hypertension   . Ischemic cardiomyopathy    a. Echo 5/17: EF 25-30%, anteroseptal, anterior, anteroseptal and apical akinesis, possible inferior hypokinesis, grade 1 diastolic dysfunction, no evidence of thrombus, trivial AI, mild MR, mild  LAE  . MI (myocardial infarction) ([place]) 1991   Inferior treated with TPA 1991, POBA RCA 1996  . Pneumonia 1980s X 1  . RAS (renal artery stenosis) ([facility])    Right renal stent 2005  . S/P Emergency CABG x 2 02/18/2016   LIMA to LAD, SVG to OM1, EVH via right thigh  . Type II diabetes mellitus ([place])    pt denies   Past Surgical History:  Procedure Laterality Date  . CARDIAC CATHETERIZATION N/A 02/18/2016   Procedure: Left Heart Cath and Coronary Angiography;  Surgeon: Leonie Man, MD;  Location: [facility];  Service: Cardiovascular;  Laterality: N/A;  . CORONARY ANGIOPLASTY  1991  . CORONARY ARTERY BYPASS GRAFT N/A 02/18/2016   Procedure: CORONARY ARTERY BYPASS GRAFTING (CABG) times 2 using left internal mammary and right greater saphenous vein;  Surgeon: Rexene Alberts, MD;  Location: [facility];  Service: Open Heart Surgery;  Laterality: N/A;  . EP IMPLANTABLE DEVICE N/A 06/16/2016   Procedure: ICD Implant;  Surgeon: Cartier Mapel Meredith Leeds, MD;  Location: [facility];  Service: Cardiovascular;  Laterality: N/A;  . RENAL ARTERY STENT Right 2005  . RHINOPLASTY Bilateral 1968  . TONSILLECTOMY AND ADENOIDECTOMY  1950s     Current Outpatient Medications  Medication Sig Dispense Refill  . aspirin 81 MG tablet Take 81 mg by mouth daily.    . calcium-vitamin D (OSCAL WITH D) 500-200 MG-UNIT per tablet Take 1 tablet by mouth 2 (two) times daily.    . carboxymethylcellulose (REFRESH PLUS) 0.5 % SOLN Place 2 drops into  both eyes daily as needed (for dry eyes).     Marland Kitchen Co-Enzyme Q10 200 MG CAPS Take 200 mg by mouth daily.     Marland Kitchen ENTRESTO 24-26 MG TAKE 1 TABLET TWICE A DAY 180 tablet 3  . ezetimibe (ZETIA) 10 MG tablet Take 10 mg by mouth daily.    . isosorbide mononitrate (IMDUR) 30 MG 24 hr tablet TAKE 1 TABLET DAILY 90 tablet 3  . Magnesium 500 MG TABS Take 500 mg by mouth daily.     . metoprolol succinate (TOPROL-XL) 25 MG 24 hr tablet TAKE 1 TABLET DAILY (OVERDUE FOR OFFICE VISIT.  CALL FOR APPOINTMENT/FUTURE REFILLS) 90 tablet 3  . Multiple Vitamin (MULTIVITAMIN) tablet Take 1 tablet by mouth daily.    Marland Kitchen NITROSTAT 0.4 MG SL tablet Place 0.4 mg under the tongue every 5 (five) minutes as needed for chest pain.     . rosuvastatin (CRESTOR) 40 MG tablet Take 1 tablet (40 mg total) by mouth daily. 90 tablet 1  . umeclidinium-vilanterol (ANORO ELLIPTA) 62.5-25 MCG/INH AEPB Inhale 1 puff into the lungs daily.    . furosemide (LASIX) 40 MG tablet Take 1 tablet (40 mg total) by mouth as needed. 30 tablet 3   Current Facility-Administered Medications  Medication Dose Route Frequency Provider Last Rate Last Admin  . 0.9 %  sodium chloride infusion  500 mL Intravenous Once Armbruster, Carlota Raspberry, MD        Allergies:   Patient has no known allergies.   Social History:  The patient  reports that she quit smoking about 9 years ago. Her smoking use included cigarettes. She has a 40.00 pack-year smoking history. She has never used smokeless tobacco. She reports that she does not drink alcohol and does not use drugs.   Family History:  The patient's family history includes Cancer in her mother; Heart attack (age of onset: 74) in her brother; Heart attack (age of onset: 62) in her father.    ROS:  Please see the history of present illness.   Otherwise, review of systems is positive for none.   All other systems are reviewed and negative.   PHYSICAL EXAM: VS:  BP 90/60   Pulse 69   Ht 5\' 6"  (1.676 m)   Wt 157 lb (71.2 kg)   LMP  (LMP Unknown)   BMI 25.34 kg/m  , BMI Body mass index is 25.34 kg/m. GEN: Well nourished, well developed, in no acute distress  HEENT: normal  Neck: no JVD, carotid bruits, or masses Cardiac: RRR; no murmurs, rubs, or gallops,no edema  Respiratory:  clear to auscultation bilaterally, normal work of breathing GI: soft, nontender, nondistended, + BS MS: no deformity or atrophy  Skin: warm and dry, device site well healed Neuro:  Strength and sensation  are intact Psych: euthymic mood, full affect  EKG:  EKG is ordered today. Personal review of the ekg ordered shows sinus rhythm, LBBB  Personal review of the device interrogation today. Results in [place]: 03/13/2020: BUN 15; Creatinine, Ser 1.02; Magnesium 2.3; Potassium 4.4; Sodium 142    Lipid Panel  No results found for: CHOL, TRIG, HDL, CHOLHDL, VLDL, LDLCALC, LDLDIRECT   Wt Readings from Last 3 Encounters:  07/23/20 157 lb (71.2 kg)  03/13/20 172 lb (78 kg)  07/08/19 176 lb 6.4 oz (80 kg)      Other studies Reviewed: Additional studies/ records that were reviewed today include:  TTE 05/20/16 - Left ventricle: The cavity size was  mildly dilated. Wall   thickness was normal. Systolic function was severely reduced. The   estimated ejection fraction was in the range of 25% to 30%. There   is akinesis of the anteroseptal and apical myocardium. Features   are consistent with a pseudonormal left ventricular filling   pattern, with concomitant abnormal relaxation and increased   filling pressure (grade 2 diastolic dysfunction). - Aortic valve: There was trivial regurgitation. - Mitral valve: Calcified annulus. Mildly thickened leaflets .   There was mild regurgitation. - Left atrium: The atrium was moderately dilated.   ASSESSMENT AND PLAN:  1.  Chronic systolic heart failure due to ischemic cardiomyopathy: Status post Saint Jude dual-chamber ICD implanted 06/16/2016.  No ventricular arrhythmias noted.  Currently on optimal therapy..    2.  Coronary artery disease status post CABG: No current chest pain.  Continue current management. 3.  Hyperlipidemia: Continue statin  4.  Left bundle branch block: Has remained stable.  She has no symptoms.  If she develops symptoms, would require device upgrade.   Current medicines are reviewed at length with the patient today.   The patient does not have concerns regarding her medicines.  The following changes were made  today: None  Labs/ tests ordered today include:  No orders of the defined types were placed in this encounter.   Disposition:   FU with Demetre Monaco with 12 months  Signed, Jamieson Hetland Meredith Leeds, MD  07/23/2020 2:23 PM     [facility] [address] [place] [address] [phone] (office) [phone] (fax)

## 2020-07-31 NOTE — Addendum Note (Signed)
Addended by: Stanton Kidney on: 07/31/2020 11:37 AM   Modules accepted: Orders

## 2020-08-12 ENCOUNTER — Telehealth: Payer: Self-pay

## 2020-08-12 NOTE — Telephone Encounter (Signed)
Received call from patient.  She didn't realize her phone was not accepting messages and wanted to follow up on last ICM remote transmission and future transmission date.  She is feeling fine and doing very well.  She will be out of town for 3 weeks and encouraged to take monitor with her. Next ICM remote transmission rescheduled to 09/01/2020.  No changes today.   CorVue Thoracic Impedance update 08/09/2020 suggests normal fluid levels

## 2020-08-31 ENCOUNTER — Ambulatory Visit (INDEPENDENT_AMBULATORY_CARE_PROVIDER_SITE_OTHER): Payer: Medicare Other

## 2020-08-31 DIAGNOSIS — I255 Ischemic cardiomyopathy: Secondary | ICD-10-CM | POA: Diagnosis not present

## 2020-08-31 DIAGNOSIS — I5022 Chronic systolic (congestive) heart failure: Secondary | ICD-10-CM

## 2020-08-31 LAB — CUP PACEART REMOTE DEVICE CHECK
Battery Remaining Longevity: 54 mo
Battery Remaining Percentage: 56 %
Battery Voltage: 2.93 V
Brady Statistic AP VP Percent: 1 %
Brady Statistic AP VS Percent: 3.2 %
Brady Statistic AS VP Percent: 1 %
Brady Statistic AS VS Percent: 97 %
Brady Statistic RA Percent Paced: 2.6 %
Brady Statistic RV Percent Paced: 1 %
Date Time Interrogation Session: 20211011062731
HighPow Impedance: 79 Ohm
HighPow Impedance: 79 Ohm
Implantable Lead Implant Date: 20170727
Implantable Lead Implant Date: 20170727
Implantable Lead Location: 753859
Implantable Lead Location: 753860
Implantable Pulse Generator Implant Date: 20170727
Lead Channel Impedance Value: 390 Ohm
Lead Channel Impedance Value: 400 Ohm
Lead Channel Pacing Threshold Amplitude: 0.5 V
Lead Channel Pacing Threshold Amplitude: 1 V
Lead Channel Pacing Threshold Pulse Width: 0.5 ms
Lead Channel Pacing Threshold Pulse Width: 0.5 ms
Lead Channel Sensing Intrinsic Amplitude: 12 mV
Lead Channel Sensing Intrinsic Amplitude: 4.3 mV
Lead Channel Setting Pacing Amplitude: 2 V
Lead Channel Setting Pacing Amplitude: 2.5 V
Lead Channel Setting Pacing Pulse Width: 0.5 ms
Lead Channel Setting Sensing Sensitivity: 0.5 mV
Pulse Gen Serial Number: 7261095

## 2020-09-01 ENCOUNTER — Ambulatory Visit (INDEPENDENT_AMBULATORY_CARE_PROVIDER_SITE_OTHER): Payer: Medicare Other

## 2020-09-01 DIAGNOSIS — I5022 Chronic systolic (congestive) heart failure: Secondary | ICD-10-CM | POA: Diagnosis not present

## 2020-09-01 DIAGNOSIS — Z95810 Presence of automatic (implantable) cardiac defibrillator: Secondary | ICD-10-CM

## 2020-09-01 NOTE — Progress Notes (Signed)
EPIC Encounter for ICM Monitoring  Patient Name: Hailey Wright is a 74 y.o. female Date: 09/01/2020 Primary Care Physican: Leanna Battles, MD Primary Cardiologist: Hochrein Electrophysiologist: Curt Bears 09/01/2020 Weight: 155lbs  Spoke with patient and reports feeling well at this time.  Denies fluid symptoms.  Pt was on vacation during decreased impedance and ate mostly restaurant foods.  Corvue thoracic impedancenormal suggesting possible fluid accumulation from 9/27 - 10/8.  Prescribed:Furosemide 40 mg 1 tablet daily as needed. Shetakes 0.5 tablet as neededsince 40 mg tends to make her legs cramp.  Labs: 03/13/2020 Creatinine 1.02, BUN 15, Potassium 4.4, Sodium 142, GFR 55-64 A complete set of results can be found in Results Review.  Recommendations:No changes and encouraged to call if experiencing any fluid symptoms.  Follow-up plan: ICM clinic phone appointment on11/16/2021. 91 day device clinic remote transmission not scheduled yet.   EP/Cardiology Office Visits:09/17/2020 with Dr.Hochrein.   Copy of ICM check sent to [facility] .   3 month ICM trend: 09/01/2020    1 Year ICM trend:       [name], RN 09/01/2020 3:22 PM

## 2020-09-02 DIAGNOSIS — Z23 Encounter for immunization: Secondary | ICD-10-CM | POA: Diagnosis not present

## 2020-09-02 NOTE — Progress Notes (Signed)
Remote ICD transmission.   

## 2020-09-10 ENCOUNTER — Ambulatory Visit: Payer: Medicare Other | Admitting: Cardiology

## 2020-09-15 ENCOUNTER — Ambulatory Visit: Payer: Medicare Other | Admitting: Cardiology

## 2020-09-15 NOTE — Progress Notes (Signed)
Cardiology Office Note   Date:  09/17/2020   ID:  Hailey Wright, DOB Oct 15, 1946, MRN [id number]  PCP:  Leanna Battles, MD  Cardiologist:   Minus Breeding, MD   Chief Complaint  Patient presents with  . Coronary Artery Disease     History of Present Illness: Hailey Wright is a 74 y.o. female who presents for follow of CAD and ischemic cardiomyopathy.  She has a history of known coronary disease as previously described. She had a negative stress perfusion study in the fall of 2016.  However, she came back with symptoms of acute onset chest pain. The plan was an elective cardiac catheterization. However, prior to that presented with an acute anterior myocardial infarction. She had occlusion of the LAD with acute thrombus in the circumflex. She did have chronic occlusion of her right coronary artery. She was placed on intra-aortic balloon pump and had emergent CABG with a LIMA to the LAD and SVG to OM1. She is left with an ejection fraction of approximately 30%. She had an ICD placed.   Her EF improved on echo in July 2018 to 35%. EF was 35 - 40% in July of last year.  I have been titrating meds.  At the last visit I was not able to titrate meds because of low BPs.    She returns for follow up.  She has done very well.  She and her husband taking some trips.  They are at [employer]  The patient denies any new symptoms such as chest discomfort, neck or arm discomfort. There has been no new shortness of breath, PND or orthopnea. There have been no reported palpitations, presyncope or syncope.    Past Medical History:  Diagnosis Date  . Acute ST elevation myocardial infarction (STEMI) involving left anterior descending coronary artery ([place]) 02/18/2016  . AICD (automatic cardioverter/defibrillator) present   . CAD (coronary artery disease) 06/04/2013  . CHF (congestive heart failure) ([place])   . Clotting disorder ([place])   . COPD (chronic obstructive pulmonary disease) ([place])   . Coronary  artery thrombosis ([place]) 02/18/2016  . DVT (deep venous thrombosis) ([place]) 1980s   RLE  . Hepatitis A infection 1987  . Hyperlipidemia   . Hypertension   . Ischemic cardiomyopathy    a. Echo 5/17: EF 25-30%, anteroseptal, anterior, anteroseptal and apical akinesis, possible inferior hypokinesis, grade 1 diastolic dysfunction, no evidence of thrombus, trivial AI, mild MR, mild LAE  . MI (myocardial infarction) ([place]) 1991   Inferior treated with TPA 1991, POBA RCA 1996  . Pneumonia 1980s X 1  . RAS (renal artery stenosis) ([facility])    Right renal stent 2005  . S/P Emergency CABG x 2 02/18/2016   LIMA to LAD, SVG to OM1, EVH via right thigh  . Type II diabetes mellitus ([place])    pt denies    Past Surgical History:  Procedure Laterality Date  . CARDIAC CATHETERIZATION N/A 02/18/2016   Procedure: Left Heart Cath and Coronary Angiography;  Surgeon: Leonie Man, MD;  Location: [facility];  Service: Cardiovascular;  Laterality: N/A;  . CORONARY ANGIOPLASTY  1991  . CORONARY ARTERY BYPASS GRAFT N/A 02/18/2016   Procedure: CORONARY ARTERY BYPASS GRAFTING (CABG) times 2 using left internal mammary and right greater saphenous vein;  Surgeon: Rexene Alberts, MD;  Location: [facility];  Service: Open Heart Surgery;  Laterality: N/A;  . EP IMPLANTABLE DEVICE N/A 06/16/2016   Procedure: ICD Implant;  Surgeon: [employer],  MD;  Location: [facility];  Service: Cardiovascular;  Laterality: N/A;  . RENAL ARTERY STENT Right 2005  . RHINOPLASTY Bilateral 1968  . TONSILLECTOMY AND ADENOIDECTOMY  1950s     Current Outpatient Medications  Medication Sig Dispense Refill  . aspirin 81 MG tablet Take 81 mg by mouth daily.    . calcium-vitamin D (OSCAL WITH D) 500-200 MG-UNIT per tablet Take 1 tablet by mouth 2 (two) times daily.    . carboxymethylcellulose (REFRESH PLUS) 0.5 % SOLN Place 2 drops into both eyes daily as needed (for dry eyes).     Marland Kitchen Co-Enzyme Q10 200 MG CAPS Take 200 mg by mouth  daily.     Marland Kitchen ENTRESTO 24-26 MG TAKE 1 TABLET TWICE A DAY 180 tablet 3  . ezetimibe (ZETIA) 10 MG tablet Take 10 mg by mouth daily.    . isosorbide mononitrate (IMDUR) 30 MG 24 hr tablet TAKE 1 TABLET DAILY 90 tablet 3  . Magnesium 500 MG TABS Take 500 mg by mouth daily.     . metoprolol succinate (TOPROL-XL) 25 MG 24 hr tablet TAKE 1 TABLET DAILY (OVERDUE FOR OFFICE VISIT. CALL FOR APPOINTMENT/FUTURE REFILLS) 90 tablet 3  . Multiple Vitamin (MULTIVITAMIN) tablet Take 1 tablet by mouth daily.    Marland Kitchen NITROSTAT 0.4 MG SL tablet Place 0.4 mg under the tongue every 5 (five) minutes as needed for chest pain.     . rosuvastatin (CRESTOR) 40 MG tablet Take 1 tablet (40 mg total) by mouth daily. 90 tablet 1  . umeclidinium-vilanterol (ANORO ELLIPTA) 62.5-25 MCG/INH AEPB Inhale 1 puff into the lungs daily.    . furosemide (LASIX) 40 MG tablet Take 1 tablet (40 mg total) by mouth as needed. 30 tablet 3   Current Facility-Administered Medications  Medication Dose Route Frequency Provider Last Rate Last Admin  . 0.9 %  sodium chloride infusion  500 mL Intravenous Once Armbruster, Carlota Raspberry, MD        Allergies:   Patient has no known allergies.    ROS:  Please see the history of present illness.   Otherwise, review of systems are positive for none.   All other systems are reviewed and negative.    PHYSICAL EXAM: VS:  BP 130/76   Pulse 67   Temp (!) 96.8 F (36 C)   Ht 5\' 6"  (1.676 m)   Wt 156 lb 3.2 oz (70.9 kg)   LMP  (LMP Unknown)   SpO2 96%   BMI 25.21 kg/m  , BMI Body mass index is 25.21 kg/m.  GENERAL:  Well appearing NECK:  No jugular venous distention, waveform within normal limits, carotid upstroke brisk and symmetric, no bruits, no thyromegaly LUNGS:  Clear to auscultation bilaterally CHEST: Well-healed ICD scar HEART:  PMI not displaced or sustained,S1 and S2 within normal limits, no S3, no S4, no clicks, no rubs, no murmurs ABD:  Flat, positive bowel sounds normal in frequency in  pitch, no bruits, no rebound, no guarding, no midline pulsatile mass, no hepatomegaly, no splenomegaly EXT:  2 plus pulses throughout, no edema, no cyanosis no clubbing   EKG:  EKG is not ordered today.   Recent Labs: 03/13/2020: BUN 15; Creatinine, Ser 1.02; Magnesium 2.3; Potassium 4.4; Sodium 142    Lipid Panel No results found for: CHOL, TRIG, HDL, CHOLHDL, VLDL, LDLCALC, LDLDIRECT    Wt Readings from Last 3 Encounters:  09/17/20 156 lb 3.2 oz (70.9 kg)  07/23/20 157 lb (71.2 kg)  03/13/20  172 lb (78 kg)       Other studies Reviewed: Additional studies/ records that were reviewed today include:  Labs. Review of the above records demonstrates:   See elsewhere  ASSESSMENT AND PLAN:   CAD -    The patient has no new sypmtoms.  No further cardiovascular testing is indicated.  We will continue with aggressive risk reduction and meds as listed.  Ischemic CM -     Her EF has been 25 to 30%.  She would not tolerate med titration as her pressures often in the systolics 79G.  No change in therapy.   HL -     LDL was 46.  No change in therapy.   ICD -     she is up-to-date with follow-up no change in therapy.  BRUIT -    She had no significant stenosis.       Current medicines are reviewed at length with the patient today.  The patient does not have concerns regarding medicines.  The following changes have been made:   None Labs/ tests ordered today include:  None    No orders of the defined types were placed in this encounter.    Disposition:   FU with with me in Feb  Signed, Jden Want, MD  09/17/2020 7:50 AM    [facility]

## 2020-09-17 ENCOUNTER — Encounter: Payer: Self-pay | Admitting: Cardiology

## 2020-09-17 ENCOUNTER — Other Ambulatory Visit: Payer: Self-pay

## 2020-09-17 ENCOUNTER — Ambulatory Visit (INDEPENDENT_AMBULATORY_CARE_PROVIDER_SITE_OTHER): Payer: Medicare Other | Admitting: Cardiology

## 2020-09-17 VITALS — BP 130/76 | HR 67 | Temp 96.8°F | Ht 66.0 in | Wt 156.2 lb

## 2020-09-17 DIAGNOSIS — Z95810 Presence of automatic (implantable) cardiac defibrillator: Secondary | ICD-10-CM

## 2020-09-17 DIAGNOSIS — I2510 Atherosclerotic heart disease of native coronary artery without angina pectoris: Secondary | ICD-10-CM | POA: Diagnosis not present

## 2020-09-17 DIAGNOSIS — I255 Ischemic cardiomyopathy: Secondary | ICD-10-CM | POA: Diagnosis not present

## 2020-09-17 DIAGNOSIS — E785 Hyperlipidemia, unspecified: Secondary | ICD-10-CM | POA: Diagnosis not present

## 2020-09-17 NOTE — Patient Instructions (Signed)
Medication Instructions:  No changes *If you need a refill on your cardiac medications before your next appointment, please call your pharmacy*   Lab Work: None ordered If you have labs (blood work) drawn today and your tests are completely normal, you will receive your results only by: Marland Kitchen MyChart Message (if you have MyChart) OR . A paper copy in the mail If you have any lab test that is abnormal or we need to change your treatment, we will call you to review the results.   Testing/Procedures: None ordered   Follow-Up: At [facility], you and your health needs are our priority.  As part of our continuing mission to provide you with exceptional heart care, we have created designated Provider Care Teams.  These Care Teams include your primary Cardiologist (physician) and Advanced Practice Providers (APPs -  Physician Assistants and Nurse Practitioners) who all work together to provide you with the care you need, when you need it.  We recommend signing up for the patient portal called "MyChart".  Sign up information is provided on this After Visit Summary.  MyChart is used to connect with patients for Virtual Visits (Telemedicine).  Patients are able to view lab/test results, encounter notes, upcoming appointments, etc.  Non-urgent messages can be sent to your provider as well.   To learn more about what you can do with MyChart, go to [email or web address].    Your next appointment:   4 month(s)  The format for your next appointment:   In Person  Provider:   Minus Breeding, MD   Other Instructions None

## 2020-09-26 DIAGNOSIS — Z23 Encounter for immunization: Secondary | ICD-10-CM | POA: Diagnosis not present

## 2020-10-06 ENCOUNTER — Ambulatory Visit (INDEPENDENT_AMBULATORY_CARE_PROVIDER_SITE_OTHER): Payer: Medicare Other

## 2020-10-06 DIAGNOSIS — Z95810 Presence of automatic (implantable) cardiac defibrillator: Secondary | ICD-10-CM

## 2020-10-06 DIAGNOSIS — I5022 Chronic systolic (congestive) heart failure: Secondary | ICD-10-CM

## 2020-10-09 NOTE — Progress Notes (Signed)
EPIC Encounter for ICM Monitoring  Patient Name: Hailey Wright is a 74 y.o. female Date: 10/09/2020 Primary Care Physican: Leanna Battles, MD Primary Cardiologist: Hochrein Electrophysiologist: Curt Bears 10/09/2020 Weight: 155lbs  Spoke with patient and reports feeling well at this time.  Denies fluid symptoms.    Corvue thoracic impedancenormal.  Prescribed:Furosemide 40 mg 1 tablet daily as needed. Shetakes 0.5 tablet as neededsince 40 mg tends to make her legs cramp.  Labs: 03/13/2020 Creatinine 1.02, BUN 15, Potassium 4.4, Sodium 142, GFR 55-64 A complete set of results can be found in Results Review.  Recommendations:No changes and encouraged to call if experiencing any fluid symptoms.  Follow-up plan: ICM clinic phone appointment on12/20/2021. 91 day device clinic remote transmission 12/01/2019.   EP/Cardiology Office Visits:09/17/2020 with Dr.Hochrein.   Copy of ICM check sent to [facility].  3 month ICM trend: 10/06/2020    1 Year ICM trend:       [name], RN 10/09/2020 9:13 AM

## 2020-11-09 ENCOUNTER — Ambulatory Visit (INDEPENDENT_AMBULATORY_CARE_PROVIDER_SITE_OTHER): Payer: Medicare Other

## 2020-11-09 DIAGNOSIS — Z95810 Presence of automatic (implantable) cardiac defibrillator: Secondary | ICD-10-CM

## 2020-11-09 DIAGNOSIS — I5022 Chronic systolic (congestive) heart failure: Secondary | ICD-10-CM | POA: Diagnosis not present

## 2020-11-09 NOTE — Progress Notes (Signed)
EPIC Encounter for ICM Monitoring  Patient Name: Hailey Wright is a 74 y.o. female Date: 11/09/2020 Primary Care Physican: Leanna Battles, MD Primary Cardiologist: Hochrein Electrophysiologist: Curt Bears 11/09/2020 Weight: 154lbs  Spoke with patient and reports feeling well at this time. Denies fluid symptoms.  She has taken PRN furosemide a few times in the past month.   Corvue thoracic impedancenormal.  Prescribed:Furosemide 40 mg 1 tablet daily as needed. Shetakes 0.5 tablet as neededsince 40 mg tends to make her legs cramp.  Labs: 03/13/2020 Creatinine 1.02, BUN 15, Potassium 4.4, Sodium 142, GFR 55-64 A complete set of results can be found in Results Review.  Recommendations:No changes and encouraged to call if experiencing any fluid symptoms.  Follow-up plan: ICM clinic phone appointment on2/11/2020. 91 day device clinic remote transmission1/08/2020.   EP/Cardiology Office Visits:01/15/2021 with Dr. Percival Spanish.   Copy of ICM check sent to [facility].   3 month ICM trend: 10/22/2020    1 Year ICM trend:       [name], RN 11/09/2020 4:04 PM

## 2020-11-26 ENCOUNTER — Other Ambulatory Visit: Payer: Self-pay | Admitting: *Deleted

## 2020-11-26 DIAGNOSIS — Z87891 Personal history of nicotine dependence: Secondary | ICD-10-CM

## 2020-11-30 ENCOUNTER — Ambulatory Visit (INDEPENDENT_AMBULATORY_CARE_PROVIDER_SITE_OTHER): Payer: Medicare Other

## 2020-11-30 DIAGNOSIS — I255 Ischemic cardiomyopathy: Secondary | ICD-10-CM | POA: Diagnosis not present

## 2020-11-30 LAB — CUP PACEART REMOTE DEVICE CHECK
Battery Remaining Longevity: 53 mo
Battery Remaining Percentage: 54 %
Battery Voltage: 2.93 V
Brady Statistic AP VP Percent: 1 %
Brady Statistic AP VS Percent: 2.6 %
Brady Statistic AS VP Percent: 1 %
Brady Statistic AS VS Percent: 97 %
Brady Statistic RA Percent Paced: 1.8 %
Brady Statistic RV Percent Paced: 1 %
Date Time Interrogation Session: 20220110042234
HighPow Impedance: 75 Ohm
HighPow Impedance: 75 Ohm
Implantable Lead Implant Date: 20170727
Implantable Lead Implant Date: 20170727
Implantable Lead Location: 753859
Implantable Lead Location: 753860
Implantable Pulse Generator Implant Date: 20170727
Lead Channel Impedance Value: 380 Ohm
Lead Channel Impedance Value: 410 Ohm
Lead Channel Pacing Threshold Amplitude: 0.5 V
Lead Channel Pacing Threshold Amplitude: 1 V
Lead Channel Pacing Threshold Pulse Width: 0.5 ms
Lead Channel Pacing Threshold Pulse Width: 0.5 ms
Lead Channel Sensing Intrinsic Amplitude: 12 mV
Lead Channel Sensing Intrinsic Amplitude: 3.6 mV
Lead Channel Setting Pacing Amplitude: 2 V
Lead Channel Setting Pacing Amplitude: 2.5 V
Lead Channel Setting Pacing Pulse Width: 0.5 ms
Lead Channel Setting Sensing Sensitivity: 0.5 mV
Pulse Gen Serial Number: 7261095

## 2020-12-06 ENCOUNTER — Other Ambulatory Visit: Payer: Self-pay | Admitting: Cardiology

## 2020-12-10 DIAGNOSIS — R739 Hyperglycemia, unspecified: Secondary | ICD-10-CM | POA: Diagnosis not present

## 2020-12-10 DIAGNOSIS — E785 Hyperlipidemia, unspecified: Secondary | ICD-10-CM | POA: Diagnosis not present

## 2020-12-11 ENCOUNTER — Other Ambulatory Visit: Payer: Self-pay

## 2020-12-11 ENCOUNTER — Ambulatory Visit
Admission: RE | Admit: 2020-12-11 | Discharge: 2020-12-11 | Disposition: A | Discharge status: Home / Self Care | Payer: Medicare Other | Source: Ambulatory Visit | Attending: Acute Care | Admitting: Acute Care

## 2020-12-11 DIAGNOSIS — I700 Atherosclerosis of aorta: Secondary | ICD-10-CM | POA: Diagnosis not present

## 2020-12-11 DIAGNOSIS — J8410 Pulmonary fibrosis, unspecified: Secondary | ICD-10-CM | POA: Diagnosis not present

## 2020-12-11 DIAGNOSIS — Z87891 Personal history of nicotine dependence: Secondary | ICD-10-CM

## 2020-12-11 DIAGNOSIS — J432 Centrilobular emphysema: Secondary | ICD-10-CM | POA: Diagnosis not present

## 2020-12-14 DIAGNOSIS — I2510 Atherosclerotic heart disease of native coronary artery without angina pectoris: Secondary | ICD-10-CM | POA: Diagnosis not present

## 2020-12-14 DIAGNOSIS — Z1212 Encounter for screening for malignant neoplasm of rectum: Secondary | ICD-10-CM | POA: Diagnosis not present

## 2020-12-14 DIAGNOSIS — I700 Atherosclerosis of aorta: Secondary | ICD-10-CM | POA: Diagnosis not present

## 2020-12-14 DIAGNOSIS — R82998 Other abnormal findings in urine: Secondary | ICD-10-CM | POA: Diagnosis not present

## 2020-12-14 DIAGNOSIS — J439 Emphysema, unspecified: Secondary | ICD-10-CM | POA: Diagnosis not present

## 2020-12-14 DIAGNOSIS — I701 Atherosclerosis of renal artery: Secondary | ICD-10-CM | POA: Diagnosis not present

## 2020-12-14 DIAGNOSIS — Z1331 Encounter for screening for depression: Secondary | ICD-10-CM | POA: Diagnosis not present

## 2020-12-14 DIAGNOSIS — Z0000 Encounter for general adult medical examination without abnormal findings: Secondary | ICD-10-CM | POA: Diagnosis not present

## 2020-12-14 DIAGNOSIS — Z87891 Personal history of nicotine dependence: Secondary | ICD-10-CM | POA: Diagnosis not present

## 2020-12-14 DIAGNOSIS — I255 Ischemic cardiomyopathy: Secondary | ICD-10-CM | POA: Diagnosis not present

## 2020-12-14 DIAGNOSIS — M19012 Primary osteoarthritis, left shoulder: Secondary | ICD-10-CM | POA: Diagnosis not present

## 2020-12-14 DIAGNOSIS — R739 Hyperglycemia, unspecified: Secondary | ICD-10-CM | POA: Diagnosis not present

## 2020-12-14 DIAGNOSIS — I10 Essential (primary) hypertension: Secondary | ICD-10-CM | POA: Diagnosis not present

## 2020-12-14 DIAGNOSIS — I722 Aneurysm of renal artery: Secondary | ICD-10-CM | POA: Diagnosis not present

## 2020-12-14 NOTE — Progress Notes (Signed)
Remote ICD transmission.   

## 2020-12-16 ENCOUNTER — Telehealth: Payer: Self-pay | Admitting: Unknown Physician Specialty

## 2020-12-16 ENCOUNTER — Other Ambulatory Visit: Payer: Self-pay | Admitting: Unknown Physician Specialty

## 2020-12-16 ENCOUNTER — Telehealth: Payer: Self-pay | Admitting: *Deleted

## 2020-12-16 ENCOUNTER — Encounter: Payer: Self-pay | Admitting: Unknown Physician Specialty

## 2020-12-16 DIAGNOSIS — U071 COVID-19: Secondary | ICD-10-CM

## 2020-12-16 NOTE — Telephone Encounter (Signed)
I connected by phone with Hailey Wright on 12/16/2020 at 3:19 PM to discuss the potential use of the antiviral REMDESIVIR for acute COVID-19 viral infection in non-hospitalized patients.   This patient is a 75 y.o. female that meets the FDA criteria for Emergency Use Authorization of [place].  Has a (+) direct SARS-CoV-2 viral test result  Has mild or moderate symptoms related to COVID-19  Is within 7 days of symptom onset  Has at least one of the high risk factor(s) for progression to severe COVID-19 and/or hospitalization as defined in NIH Guidelines and EUA.   Specific high risk criteria : Older age (>/= 73 yo) and Cardiovascular disease or hypertension   I have spoken and communicated the following to the patient or parent/caregiver regarding COVID-19 IV Antiviral treatment:  1. FDA has authorized the emergency use for the treatment of mild to moderate COVID-19 in adults and pediatric patients with positive results of SARS-CoV-2 testing who are 10 years of age and older weighing at least 40 kg, and who are at high risk for progressing to severe COVID-19 and/or hospitalization.  2. The significant known and potential risks and benefits in receiving REMDESIVIR in accordance with current NIH treatment guidelines.   3. Information on available alternative treatments and the risks and benefits of those alternatives, including clinical trials that may be accessible to the patient.   4. Patients treated with antiviral therapy should continue to isolate and use infection control measures (e.g., wear mask, isolate, social distance, avoid sharing personal items, clean and disinfect "high touch" surfaces, and frequent handwashing) according to CDC guidelines.   5. The patient or parent/caregiver has the option to accept or refuse REMDESIVIR therapy and has had the opportunity to have all questions addressed prior to consenting for treatment.    After reviewing this information with the patient,  he/she has decided to proceed with the 3 day course of treatment.   Hailey Haddock, NP 12/16/2020  3:19 PM  Sx onset 1/21

## 2020-12-16 NOTE — Telephone Encounter (Signed)
Called to discuss with patient about COVID-19 symptoms and the use of one of the available treatments for those with mild to moderate Covid symptoms and at a high risk of hospitalization.  Pt appears to qualify for outpatient treatment due to co-morbid conditions and/or a member of an at-risk group in accordance with the FDA Emergency Use Authorization.   Patient currently has fatigue, cough, head congestion.   Symptom onset: 12/11/20 Today is day 6 of symptoms. Vaccinated: Yes Booster? Yes  Immunocompromised? no Qualifiers: Heart disease-HTN  Patient is expecting a call from APP.  Tarry Kos

## 2020-12-17 ENCOUNTER — Other Ambulatory Visit: Payer: Self-pay

## 2020-12-17 ENCOUNTER — Ambulatory Visit (HOSPITAL_COMMUNITY)
Admission: RE | Admit: 2020-12-17 | Discharge: 2020-12-17 | Disposition: A | Discharge status: Home / Self Care | Payer: Medicare Other | Source: Ambulatory Visit | Attending: Pulmonary Disease | Admitting: Pulmonary Disease

## 2020-12-17 DIAGNOSIS — I722 Aneurysm of renal artery: Secondary | ICD-10-CM

## 2020-12-17 DIAGNOSIS — R54 Age-related physical debility: Secondary | ICD-10-CM | POA: Insufficient documentation

## 2020-12-17 DIAGNOSIS — U071 COVID-19: Secondary | ICD-10-CM | POA: Diagnosis not present

## 2020-12-17 DIAGNOSIS — I10 Essential (primary) hypertension: Secondary | ICD-10-CM | POA: Diagnosis not present

## 2020-12-17 MED ORDER — SODIUM CHLORIDE 0.9 % IV SOLN
100.0000 mg | Freq: Once | INTRAVENOUS | Status: AC
  Administered 2020-12-17: 100 mg via INTRAVENOUS

## 2020-12-17 MED ORDER — SODIUM CHLORIDE 0.9 % IV SOLN: INTRAVENOUS | Status: DC | PRN

## 2020-12-17 MED ORDER — FAMOTIDINE IN NACL 20-0.9 MG/50ML-% IV SOLN: 20.0000 mg | Freq: Once | INTRAVENOUS | Status: DC | PRN

## 2020-12-17 MED ORDER — ALBUTEROL SULFATE HFA 108 (90 BASE) MCG/ACT IN AERS: 2.0000 | INHALATION_SPRAY | Freq: Once | RESPIRATORY_TRACT | Status: DC | PRN

## 2020-12-17 MED ORDER — METHYLPREDNISOLONE SODIUM SUCC 125 MG IJ SOLR: 125.0000 mg | Freq: Once | INTRAMUSCULAR | Status: DC | PRN

## 2020-12-17 MED ORDER — EPINEPHRINE 0.3 MG/0.3ML IJ SOAJ: 0.3000 mg | Freq: Once | INTRAMUSCULAR | Status: DC | PRN

## 2020-12-17 MED ORDER — DIPHENHYDRAMINE HCL 50 MG/ML IJ SOLN: 50.0000 mg | Freq: Once | INTRAMUSCULAR | Status: DC | PRN

## 2020-12-17 NOTE — Progress Notes (Deleted)
  Diagnosis: COVID-19  Physician: Dr. Wright  Procedure: [facility] Med: remdesivir infusion - Provided patient with remdesivir fact sheet for patients, parents and caregivers prior to infusion.  Complications: No immediate complications noted.  Discharge: Discharged home   Hailey Wright 12/17/2020  

## 2020-12-17 NOTE — Progress Notes (Signed)
  Diagnosis: COVID-19  Physician: Dr. Joya Gaskins  Procedure: [facility] Infusion Clinic Med: remdesivir infusion - Provided patient with remdesivir fact sheet for patients, parents and caregivers prior to infusion.  Complications: No immediate complications noted.  Discharge: Discharged home   Hailey Wright 12/17/2020

## 2020-12-17 NOTE — Discharge Instructions (Signed)
10 Things You Can Do to Manage Your COVID-19 Symptoms at Home °If you have possible or confirmed COVID-19: °1. Stay home except to get medical care. °2. Monitor your symptoms carefully. If your symptoms get worse, call your healthcare provider immediately. °3. Get rest and stay hydrated. °4. If you have a medical appointment, call the healthcare provider ahead of time and tell them that you have or may have COVID-19. °5. For medical emergencies, call 911 and notify the dispatch personnel that you have or may have COVID-19. °6. Cover your cough and sneezes with a tissue or use the inside of your elbow. °7. Wash your hands often with soap and water for at least 20 seconds or clean your hands with an alcohol-based hand sanitizer that contains at least 60% alcohol. °8. As much as possible, stay in a specific room and away from other people in your home. Also, you should use a separate bathroom, if available. If you need to be around other people in or outside of the home, wear a mask. °9. Avoid sharing personal items with other people in your household, like dishes, towels, and bedding. °10. Clean all surfaces that are touched often, like counters, tabletops, and doorknobs. Use household cleaning sprays or wipes according to the label instructions. °cdc.gov/coronavirus °06/05/2020 °This information is not intended to replace advice given to you by your health care provider. Make sure you discuss any questions you have with your health care provider. °Document Revised: 09/21/2020 Document Reviewed: 09/21/2020 °Elsevier Patient Education © 2021 Elsevier Inc. °If you have any questions or concerns after the infusion please call the Advanced Practice Provider on call at [phone]. This number is ONLY intended for your use regarding questions or concerns about the infusion post-treatment side-effects.  Please do not provide this number to others for use. For return to work notes please contact your primary care provider.   ° °If someone you know is interested in receiving treatment please have them contact their MD for a referral or visit [email or web address] ° ° ° °

## 2020-12-18 ENCOUNTER — Ambulatory Visit (HOSPITAL_COMMUNITY)
Admission: RE | Admit: 2020-12-18 | Discharge: 2020-12-18 | Disposition: A | Discharge status: Home / Self Care | Payer: Medicare Other | Source: Ambulatory Visit | Attending: Pulmonary Disease | Admitting: Pulmonary Disease

## 2020-12-18 DIAGNOSIS — I10 Essential (primary) hypertension: Secondary | ICD-10-CM | POA: Diagnosis not present

## 2020-12-18 DIAGNOSIS — R54 Age-related physical debility: Secondary | ICD-10-CM | POA: Diagnosis not present

## 2020-12-18 DIAGNOSIS — U071 COVID-19: Secondary | ICD-10-CM | POA: Diagnosis not present

## 2020-12-18 MED ORDER — METHYLPREDNISOLONE SODIUM SUCC 125 MG IJ SOLR: 125.0000 mg | Freq: Once | INTRAMUSCULAR | Status: DC | PRN

## 2020-12-18 MED ORDER — SODIUM CHLORIDE 0.9 % IV SOLN: INTRAVENOUS | Status: DC | PRN

## 2020-12-18 MED ORDER — SODIUM CHLORIDE 0.9 % IV SOLN
100.0000 mg | Freq: Once | INTRAVENOUS | Status: AC
  Administered 2020-12-18: 100 mg via INTRAVENOUS

## 2020-12-18 MED ORDER — FAMOTIDINE IN NACL 20-0.9 MG/50ML-% IV SOLN: 20.0000 mg | Freq: Once | INTRAVENOUS | Status: DC | PRN

## 2020-12-18 MED ORDER — ALBUTEROL SULFATE HFA 108 (90 BASE) MCG/ACT IN AERS: 2.0000 | INHALATION_SPRAY | Freq: Once | RESPIRATORY_TRACT | Status: DC | PRN

## 2020-12-18 MED ORDER — DIPHENHYDRAMINE HCL 50 MG/ML IJ SOLN: 50.0000 mg | Freq: Once | INTRAMUSCULAR | Status: DC | PRN

## 2020-12-18 MED ORDER — EPINEPHRINE 0.3 MG/0.3ML IJ SOAJ: 0.3000 mg | Freq: Once | INTRAMUSCULAR | Status: DC | PRN

## 2020-12-18 NOTE — Progress Notes (Addendum)
Patient reviewed Fact Sheet for Patients, Parents, and Caregivers for Emergency Use Authorization (EUA) of remdesivir for the Treatment of Coronavirus. Patient also reviewed and is agreeable to the estimated cost of treatment. Patient is agreeable to proceed.    

## 2020-12-18 NOTE — Progress Notes (Signed)
  Diagnosis: COVID-19  Physician: Dr. Patrick Wright  Procedure: [facility]fusion [facility] Med: remdesivir infusion - Provided patient with remdesivir fact sheet for patients, parents and caregivers prior to infusion.  Complications: No immediate complications noted.  Discharge: Discharged home   Norvil Martensen 12/18/2020   

## 2020-12-18 NOTE — Discharge Instructions (Signed)
10 Things You Can Do to Manage Your COVID-19 Symptoms at Home °If you have possible or confirmed COVID-19: °1. Stay home except to get medical care. °2. Monitor your symptoms carefully. If your symptoms get worse, call your healthcare provider immediately. °3. Get rest and stay hydrated. °4. If you have a medical appointment, call the healthcare provider ahead of time and tell them that you have or may have COVID-19. °5. For medical emergencies, call 911 and notify the dispatch personnel that you have or may have COVID-19. °6. Cover your cough and sneezes with a tissue or use the inside of your elbow. °7. Wash your hands often with soap and water for at least 20 seconds or clean your hands with an alcohol-based hand sanitizer that contains at least 60% alcohol. °8. As much as possible, stay in a specific room and away from other people in your home. Also, you should use a separate bathroom, if available. If you need to be around other people in or outside of the home, wear a mask. °9. Avoid sharing personal items with other people in your household, like dishes, towels, and bedding. °10. Clean all surfaces that are touched often, like counters, tabletops, and doorknobs. Use household cleaning sprays or wipes according to the label instructions. °cdc.gov/coronavirus °06/05/2020 °This information is not intended to replace advice given to you by your health care provider. Make sure you discuss any questions you have with your health care provider. °Document Revised: 09/21/2020 Document Reviewed: 09/21/2020 °Elsevier Patient Education © 2021 Elsevier Inc. °If you have any questions or concerns after the infusion please call the Advanced Practice Provider on call at [phone]. This number is ONLY intended for your use regarding questions or concerns about the infusion post-treatment side-effects.  Please do not provide this number to others for use. For return to work notes please contact your primary care provider.   ° °If someone you know is interested in receiving treatment please have them contact their MD for a referral or visit [email or web address] ° ° ° °

## 2020-12-19 ENCOUNTER — Ambulatory Visit (HOSPITAL_COMMUNITY)
Admission: RE | Admit: 2020-12-19 | Discharge: 2020-12-19 | Disposition: A | Discharge status: Home / Self Care | Payer: Medicare Other | Source: Ambulatory Visit | Attending: Pulmonary Disease | Admitting: Pulmonary Disease

## 2020-12-19 DIAGNOSIS — I10 Essential (primary) hypertension: Secondary | ICD-10-CM | POA: Diagnosis not present

## 2020-12-19 DIAGNOSIS — U071 COVID-19: Secondary | ICD-10-CM

## 2020-12-19 DIAGNOSIS — R54 Age-related physical debility: Secondary | ICD-10-CM | POA: Diagnosis not present

## 2020-12-19 MED ORDER — ONDANSETRON HCL 4 MG/2ML IJ SOLN
4.0000 mg | Freq: Once | INTRAMUSCULAR | Status: AC
  Administered 2020-12-19: 4 mg via INTRAVENOUS

## 2020-12-19 MED ORDER — ALBUTEROL SULFATE HFA 108 (90 BASE) MCG/ACT IN AERS: 2.0000 | INHALATION_SPRAY | Freq: Once | RESPIRATORY_TRACT | Status: DC | PRN

## 2020-12-19 MED ORDER — SODIUM CHLORIDE 0.9 % IV SOLN: INTRAVENOUS | Status: DC | PRN

## 2020-12-19 MED ORDER — DIPHENHYDRAMINE HCL 50 MG/ML IJ SOLN: 50.0000 mg | Freq: Once | INTRAMUSCULAR | Status: DC | PRN

## 2020-12-19 MED ORDER — SODIUM CHLORIDE 0.9 % IV SOLN
100.0000 mg | Freq: Once | INTRAVENOUS | Status: AC
  Administered 2020-12-19: 100 mg via INTRAVENOUS

## 2020-12-19 MED ORDER — METHYLPREDNISOLONE SODIUM SUCC 125 MG IJ SOLR: 125.0000 mg | Freq: Once | INTRAMUSCULAR | Status: DC | PRN

## 2020-12-19 MED ORDER — EPINEPHRINE 0.3 MG/0.3ML IJ SOAJ: 0.3000 mg | Freq: Once | INTRAMUSCULAR | Status: DC | PRN

## 2020-12-19 MED ORDER — FAMOTIDINE IN NACL 20-0.9 MG/50ML-% IV SOLN: 20.0000 mg | Freq: Once | INTRAVENOUS | Status: DC | PRN

## 2020-12-19 NOTE — Discharge Instructions (Signed)
10 Things You Can Do to Manage Your COVID-19 Symptoms at Home °If you have possible or confirmed COVID-19: °1. Stay home except to get medical care. °2. Monitor your symptoms carefully. If your symptoms get worse, call your healthcare provider immediately. °3. Get rest and stay hydrated. °4. If you have a medical appointment, call the healthcare provider ahead of time and tell them that you have or may have COVID-19. °5. For medical emergencies, call 911 and notify the dispatch personnel that you have or may have COVID-19. °6. Cover your cough and sneezes with a tissue or use the inside of your elbow. °7. Wash your hands often with soap and water for at least 20 seconds or clean your hands with an alcohol-based hand sanitizer that contains at least 60% alcohol. °8. As much as possible, stay in a specific room and away from other people in your home. Also, you should use a separate bathroom, if available. If you need to be around other people in or outside of the home, wear a mask. °9. Avoid sharing personal items with other people in your household, like dishes, towels, and bedding. °10. Clean all surfaces that are touched often, like counters, tabletops, and doorknobs. Use household cleaning sprays or wipes according to the label instructions. °cdc.gov/coronavirus °06/05/2020 °This information is not intended to replace advice given to you by your health care provider. Make sure you discuss any questions you have with your health care provider. °Document Revised: 09/21/2020 Document Reviewed: 09/21/2020 °Elsevier Patient Education © 2021 Elsevier Inc. °If you have any questions or concerns after the infusion please call the Advanced Practice Provider on call at [phone]. This number is ONLY intended for your use regarding questions or concerns about the infusion post-treatment side-effects.  Please do not provide this number to others for use. For return to work notes please contact your primary care provider.   ° °If someone you know is interested in receiving treatment please have them contact their MD for a referral or visit [email or web address] ° ° ° °

## 2020-12-19 NOTE — Progress Notes (Signed)
  Diagnosis: COVID-19  Physician: Dr. Asencion Noble  Procedure: [facility] Infusion Clinic Med: remdesivir infusion - Provided patient with remdesivir fact sheet for patients, parents and caregivers prior to infusion.  Complications: No immediate complications noted. However, pt became nauseas just prior to infusion- administered Zofran 4 mg and sx alleviated.  BP lowered post-infusion; provided pt with 2 cups water and BP improved. Pt denies dizziness and in good condition at discharge.  Discharge: Discharged home   Monna Fam 12/19/2020

## 2020-12-22 ENCOUNTER — Ambulatory Visit (INDEPENDENT_AMBULATORY_CARE_PROVIDER_SITE_OTHER): Payer: Medicare Other

## 2020-12-22 DIAGNOSIS — Z95810 Presence of automatic (implantable) cardiac defibrillator: Secondary | ICD-10-CM

## 2020-12-22 DIAGNOSIS — I5022 Chronic systolic (congestive) heart failure: Secondary | ICD-10-CM

## 2020-12-22 NOTE — Progress Notes (Signed)
Please call patient and let them  know their  low dose Ct was read as a Lung RADS 2: nodules that are benign in appearance and behavior with a very low likelihood of becoming a clinically active cancer due to size or lack of growth. Recommendation per radiology is for a repeat LDCT in 12 months. .Please let them  know we will order and schedule their  annual screening scan for 11/2021. Please let them  know there was notation of CAD on their  scan.  Please remind the patient  that this is a non-gated exam therefore degree or severity of disease  cannot be determined. Please have them  follow up with their PCP regarding potential risk factor modification, dietary therapy or pharmacologic therapy if clinically indicated. Pt.  is  currently on statin therapy. Please place order for annual  screening scan for  11/2021 and fax results to PCP. Thanks so much. Pt. Is post cabg and is on statin therapy.

## 2020-12-23 NOTE — Progress Notes (Signed)
EPIC Encounter for ICM Monitoring  Patient Name: Caprice Mccaffrey is a 75 y.o. female Date: 12/23/2020 Primary Care Physican: Leanna Battles, MD Primary Cardiologist: Hochrein Electrophysiologist: Curt Bears 12/23/2020 Weight: 153lbs  Spoke with patient and reports being dx with COVID on 12/14/2020. She said she thought she may have gotten COVID at church.  Corvue thoracic impedancenormal but was suggesting possible fluid accumulation 1/21-1/25 and 1/27-1/30.  Prescribed:Furosemide 40 mg 1 tablet daily as needed. Shetakes 0.5 tablet as neededsince 40 mg tends to make her legs cramp.  Labs: 03/13/2020 Creatinine 1.02, BUN 15, Potassium 4.4, Sodium 142, GFR 55-64 A complete set of results can be found in Results Review.  Recommendations:No changes and encouraged to call if experiencing any fluid symptoms.  Follow-up plan: ICM clinic phone appointment on3/05/2021. 91 day device clinic remote transmission4/09/2021.   EP/Cardiology Office Visits:01/15/2021 with Dr. Percival Spanish.   Copy of ICM check sent to [facility].   3 month ICM trend: 12/22/2020.    1 Year ICM trend:       [name], RN 12/23/2020 4:31 PM

## 2020-12-24 ENCOUNTER — Other Ambulatory Visit: Payer: Self-pay | Admitting: *Deleted

## 2020-12-24 DIAGNOSIS — Z87891 Personal history of nicotine dependence: Secondary | ICD-10-CM

## 2020-12-31 ENCOUNTER — Encounter: Payer: Medicare Other | Admitting: Vascular Surgery

## 2021-01-01 DIAGNOSIS — R300 Dysuria: Secondary | ICD-10-CM | POA: Diagnosis not present

## 2021-01-04 ENCOUNTER — Ambulatory Visit
Admission: RE | Admit: 2021-01-04 | Discharge: 2021-01-04 | Disposition: A | Discharge status: Home / Self Care | Payer: Medicare Other | Source: Ambulatory Visit | Attending: Vascular Surgery | Admitting: Vascular Surgery

## 2021-01-04 DIAGNOSIS — R911 Solitary pulmonary nodule: Secondary | ICD-10-CM | POA: Diagnosis not present

## 2021-01-04 DIAGNOSIS — I700 Atherosclerosis of aorta: Secondary | ICD-10-CM | POA: Diagnosis not present

## 2021-01-04 DIAGNOSIS — I722 Aneurysm of renal artery: Secondary | ICD-10-CM

## 2021-01-04 DIAGNOSIS — I701 Atherosclerosis of renal artery: Secondary | ICD-10-CM | POA: Diagnosis not present

## 2021-01-04 MED ORDER — IOPAMIDOL (ISOVUE-370) INJECTION 76%
75.0000 mL | Freq: Once | INTRAVENOUS | Status: AC | PRN
  Administered 2021-01-04: 75 mL via INTRAVENOUS

## 2021-01-07 ENCOUNTER — Ambulatory Visit (INDEPENDENT_AMBULATORY_CARE_PROVIDER_SITE_OTHER): Payer: Medicare Other | Admitting: Vascular Surgery

## 2021-01-07 ENCOUNTER — Other Ambulatory Visit: Payer: Self-pay

## 2021-01-07 ENCOUNTER — Encounter: Payer: Self-pay | Admitting: Vascular Surgery

## 2021-01-07 VITALS — BP 92/53 | HR 59 | Temp 97.8°F | Resp 20 | Ht 66.0 in | Wt 159.0 lb

## 2021-01-07 DIAGNOSIS — I722 Aneurysm of renal artery: Secondary | ICD-10-CM

## 2021-01-07 DIAGNOSIS — I255 Ischemic cardiomyopathy: Secondary | ICD-10-CM | POA: Diagnosis not present

## 2021-01-07 NOTE — Progress Notes (Signed)
Referring Physician: Dr. Sharlett Iles  Patient name: Hailey Wright MRN: [id number] DOB: 03/08/1946 Sex: female  REASON FOR CONSULT: Right renal artery aneurysm  HPI: Hailey Stoermer is a 75 y.o. 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  She has no abdominal or back pain.  She had a renal stent placed on the right side in 2005.  This was apparently found on screening for hematuria.  She did have some hypertension at the time but no renal dysfunction.  She currently has not had any problems with her blood pressure and states that her systolic blood pressure is always less than 120.  I did review her most recent lab work which showed a serum creatinine of 1.0 in April 2021.  She is on antihypertensive medication.  She takes Lasix Imdur Entresto and Toprol.  She has not had any escalation of these medications recently.  Other medical problems include coronary artery disease, COPD, hyperlipidemia, hypertension, ischemic cardiomyopathy with EF of 85 to 40% on echocardiogram 2019.  She did have an AICD placed in 2017.  Past Medical History:  Diagnosis Date  . Acute ST elevation myocardial infarction (STEMI) involving left anterior descending coronary artery ([place]) 02/18/2016  . AICD (automatic cardioverter/defibrillator) present   . CAD (coronary artery disease) 06/04/2013  . CHF (congestive heart failure) ([place])   . Clotting disorder ([place])   . COPD (chronic obstructive pulmonary disease) ([place])   . 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 DVT (deep venous thrombosis) ([place]) 1980s   RLE  . Hepatitis A infection 1987  . Hyperlipidemia   . Hypertension   . Ischemic cardiomyopathy    a. Echo 5/17: EF 25-30%, anteroseptal, anterior, anteroseptal and apical akinesis, possible inferior hypokinesis, grade 1 diastolic dysfunction, no evidence of thrombus, trivial AI, mild MR, mild LAE  . MI (myocardial infarction) ([place]) 1991   Inferior treated with TPA 1991,  POBA RCA 1996  . Pneumonia 1980s X 1  . RAS (renal artery stenosis) ([facility])    Right renal stent 2005  . S/P Emergency CABG x 2 02/18/2016   LIMA to LAD, SVG to OM1, EVH via right thigh  . Type II diabetes mellitus ([place])    pt denies   Past Surgical History:  Procedure Laterality Date  . CARDIAC CATHETERIZATION N/A 02/18/2016   Procedure: Left Heart Cath and Coronary Angiography;  Surgeon: Leonie Man, MD;  Location: [facility];  Service: Cardiovascular;  Laterality: N/A;  . CORONARY ANGIOPLASTY  1991  . CORONARY ARTERY BYPASS GRAFT N/A 02/18/2016   Procedure: CORONARY ARTERY BYPASS GRAFTING (CABG) times 2 using left internal mammary and right greater saphenous vein;  Surgeon: Rexene Alberts, MD;  Location: [facility];  Service: Open Heart Surgery;  Laterality: N/A;  . EP IMPLANTABLE DEVICE N/A 06/16/2016   Procedure: ICD Implant;  Surgeon: Will Meredith Leeds, MD;  Location: [facility];  Service: Cardiovascular;  Laterality: N/A;  . RENAL ARTERY STENT Right 2005  . RHINOPLASTY Bilateral 1968  . TONSILLECTOMY AND ADENOIDECTOMY  1950s    Family History  Problem Relation Age of Onset  . Heart attack Father 64       Fatal MI 66  . Heart attack Brother 25  . Cancer Mother        metastatic from small intestine  . Colon cancer Neg Hx   . Esophageal cancer Neg Hx   . Rectal cancer Neg Hx   .  Stomach cancer Neg Hx     SOCIAL HISTORY: Social History   Socioeconomic History  . Marital status: Married    Spouse name: Not on file  . Number of children: 1  . Years of education: Not on file  . Highest education level: Not on file  Occupational History  . Not on file  Tobacco Use  . Smoking status: Former Smoker    Packs/day: 1.00    Years: 40.00    Pack years: 40.00    Types: Cigarettes    Quit date: 06/05/2011    Years since quitting: 9.6  . Smokeless tobacco: Never Used  Vaping Use  . Vaping Use: Never used  Substance and Sexual Activity  . Alcohol use: No  .  Drug use: No  . Sexual activity: Not on file  Other Topics Concern  . Not on file  Social History Narrative   Lives with husband.   Social Determinants of Health   Financial Resource Strain: Not on file  Food Insecurity: Not on file  Transportation Needs: Not on file  Physical Activity: Not on file  Stress: Not on file  Social Connections: Not on file  Intimate Partner Violence: Not on file    No Known Allergies  Current Outpatient Medications  Medication Sig Dispense Refill  . aspirin 81 MG tablet Take 81 mg by mouth daily.    Marland Kitchen atenolol (TENORMIN) 25 MG tablet Take 0.5 tablets by mouth daily.    . calcium-vitamin D (OSCAL WITH D) 500-200 MG-UNIT per tablet Take 1 tablet by mouth 2 (two) times daily.    . carboxymethylcellulose (REFRESH PLUS) 0.5 % SOLN Place 2 drops into both eyes daily as needed (for dry eyes).     Marland Kitchen Co-Enzyme Q10 200 MG CAPS Take 200 mg by mouth daily.     Marland Kitchen ENTRESTO 24-26 MG TAKE 1 TABLET TWICE A DAY 180 tablet 3  . ezetimibe (ZETIA) 10 MG tablet Take 10 mg by mouth daily.    . furosemide (LASIX) 40 MG tablet TAKE 1 TABLET BY MOUTH AS NEEDED 30 tablet 3  . isosorbide mononitrate (IMDUR) 30 MG 24 hr tablet TAKE 1 TABLET DAILY 90 tablet 3  . Magnesium 500 MG TABS Take 500 mg by mouth daily.     . metoprolol succinate (TOPROL-XL) 25 MG 24 hr tablet TAKE 1 TABLET DAILY (OVERDUE FOR OFFICE VISIT. CALL FOR APPOINTMENT/FUTURE REFILLS) 90 tablet 3  . Multiple Vitamin (MULTIVITAMIN) tablet Take 1 tablet by mouth daily.    Marland Kitchen NITROSTAT 0.4 MG SL tablet Place 0.4 mg under the tongue every 5 (five) minutes as needed for chest pain.     . rosuvastatin (CRESTOR) 40 MG tablet Take 1 tablet (40 mg total) by mouth daily. 90 tablet 1  . umeclidinium-vilanterol (ANORO ELLIPTA) 62.5-25 MCG/INH AEPB Inhale 1 puff into the lungs daily.     Current Facility-Administered Medications  Medication Dose Route Frequency Provider Last Rate Last Admin  . 0.9 %  sodium chloride infusion   500 mL Intravenous Once Armbruster, Carlota Raspberry, MD        ROS:   General:  No weight loss, Fever, chills  HEENT: No recent headaches, no nasal bleeding, no visual changes, no sore throat  Neurologic: No dizziness, blackouts, seizures. No recent symptoms of stroke or mini- stroke. No recent episodes of slurred speech, or temporary blindness.  Cardiac: No recent episodes of chest pain/pressure, no shortness of breath at rest.  No shortness of breath with exertion.  Denies history of atrial fibrillation or irregular heartbeat  Vascular: No history of rest pain in feet.  No history of claudication.  No history of non-healing ulcer, No history of DVT   Pulmonary: No home oxygen, no productive cough, no hemoptysis,  No asthma or wheezing  Musculoskeletal:  [ ]  Arthritis, [ ]  Low back pain,  [ ]  Joint pain  Hematologic:No history of hypercoagulable state.  No history of easy bleeding.  No history of anemia  Gastrointestinal: No hematochezia or melena,  No gastroesophageal reflux, no trouble swallowing  Urinary: [ ]  chronic Kidney disease, [ ]  on HD - [ ]  MWF or [ ]  TTHS, [ ]  Burning with urination, [ ]  Frequent urination, [ ]  Difficulty urinating;   Skin: No rashes  Psychological: No history of anxiety,  No history of depression   Physical Examination  Vitals:   01/07/21 0916  BP: (!) 92/53  Pulse: (!) 59  Resp: 20  Temp: 97.8 F (36.6 C)  SpO2: 97%  Weight: 159 lb (72.1 kg)  Height: 5\' 6"  (1.676 m)    Body mass index is 25.66 kg/m.  General:  Alert and oriented, no acute distress HEENT: Normal Neck: No JVD Cardiac: Regular Rate and Rhythm Abdomen: Soft, non-tender, non-distended, no mass Skin: No rash Extremity Pulses:  2+ radial, brachial, femoral pulses bilaterally Musculoskeletal: No deformity or edema  Neurologic: Upper and lower extremity motor 5/5 and symmetric  DATA:  Reviewed the patient's CT angiogram of the abdomen and pelvis dated January 04, 2021.  This  shows a patent right renal artery stent there is a saccular aneurysm from the distal right renal artery near the bifurcation which is 13 mm in diameter and is thrombosed.  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   PLAN: Patient needs a repeat CT scan in 2 years.  He can follow-up in our APP clinic at that time.  If that renal artery aneurysm is stable at that point she probably needs no further follow-up.  She will need monitoring of her blood pressure and renal function.  If she has any difficulty controlling her blood pressure with worsening hypertension or decline in renal function we would also consider further evaluation of her right renal artery.   Ruta Hinds, MD [facility] Office: [phone]

## 2021-01-14 NOTE — Progress Notes (Signed)
Cardiology Office Note   Date:  01/15/2021   ID:  Hailey, Wright August 30, 1946, MRN [id number]  PCP:  Leanna Battles, MD  Cardiologist:   Minus Breeding, MD   No chief complaint on file.    History of Present Illness: Hailey Wright is a 75 y.o. female who presents for follow of CAD and ischemic cardiomyopathy.  She has a history of known coronary disease as previously described. She had a negative stress perfusion study in the fall of 2016.  However, she came back with symptoms of acute onset chest pain. The plan was an elective cardiac catheterization. However, prior to that presented with an acute anterior myocardial infarction. She had occlusion of the LAD with acute thrombus in the circumflex. She did have chronic occlusion of her right coronary artery. She was placed on intra-aortic balloon pump and had emergent CABG with a LIMA to the LAD and SVG to OM1. She is left with an ejection fraction of approximately 30%. She had an ICD placed.   Her EF improved on echo in July 2018 to 35%. EF was 35 - 40% in July of last year.  I have been titrating meds.    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  This was unchanged from previous.  She has been doing well.  She tolerates her low blood pressure.  She stays active. The patient denies any new symptoms such as chest discomfort, neck or arm discomfort. There has been no new shortness of breath, PND or orthopnea. There have been no reported palpitations, presyncope or syncope.    Past Medical History:  Diagnosis Date  . Acute ST elevation myocardial infarction (STEMI) involving left anterior descending coronary artery ([place]) 02/18/2016  . AICD (automatic cardioverter/defibrillator) present   . CAD (coronary artery disease) 06/04/2013  . CHF (congestive heart failure) ([place])   . Clotting disorder ([place])   . COPD (chronic obstructive  pulmonary disease) ([place])   . Coronary artery thrombosis ([place]) 02/18/2016  . DVT (deep venous thrombosis) ([place]) 1980s   RLE  . Hepatitis A infection 1987  . Hyperlipidemia   . Hypertension   . Ischemic cardiomyopathy    a. Echo 5/17: EF 25-30%, anteroseptal, anterior, anteroseptal and apical akinesis, possible inferior hypokinesis, grade 1 diastolic dysfunction, no evidence of thrombus, trivial AI, mild MR, mild LAE  . MI (myocardial infarction) ([place]) 1991   Inferior treated with TPA 1991, POBA RCA 1996  . Pneumonia 1980s X 1  . RAS (renal artery stenosis) ([facility])    Right renal stent 2005  . S/P Emergency CABG x 2 02/18/2016   LIMA to LAD, SVG to OM1, EVH via right thigh  . Type II diabetes mellitus ([place])    pt denies    Past Surgical History:  Procedure Laterality Date  . CARDIAC CATHETERIZATION N/A 02/18/2016   Procedure: Left Heart Cath and Coronary Angiography;  Surgeon: Leonie Man, MD;  Location: [facility];  Service: Cardiovascular;  Laterality: N/A;  . CORONARY ANGIOPLASTY  1991  . CORONARY ARTERY BYPASS GRAFT N/A 02/18/2016   Procedure: CORONARY ARTERY BYPASS GRAFTING (CABG) times 2 using left internal mammary and right greater saphenous vein;  Surgeon: Rexene Alberts, MD;  Location: [facility];  Service: Open Heart Surgery;  Laterality: N/A;  . EP IMPLANTABLE DEVICE N/A 06/16/2016   Procedure: ICD Implant;  Surgeon: [employer],  MD;  Location: [facility];  Service: Cardiovascular;  Laterality: N/A;  . RENAL ARTERY STENT Right 2005  . RHINOPLASTY Bilateral 1968  . TONSILLECTOMY AND ADENOIDECTOMY  1950s     Current Outpatient Medications  Medication Sig Dispense Refill  . aspirin 81 MG tablet Take 81 mg by mouth daily.    . calcium-vitamin D (OSCAL WITH D) 500-200 MG-UNIT per tablet Take 1 tablet by mouth 2 (two) times daily.    . carboxymethylcellulose (REFRESH PLUS) 0.5 % SOLN Place 2 drops into both eyes daily as needed (for dry eyes).     Marland Kitchen  Co-Enzyme Q10 200 MG CAPS Take 200 mg by mouth daily.     Marland Kitchen ENTRESTO 24-26 MG TAKE 1 TABLET TWICE A DAY 180 tablet 3  . ezetimibe (ZETIA) 10 MG tablet Take 10 mg by mouth daily.    . furosemide (LASIX) 40 MG tablet TAKE 1 TABLET BY MOUTH AS NEEDED 30 tablet 3  . isosorbide mononitrate (IMDUR) 30 MG 24 hr tablet TAKE 1 TABLET DAILY 90 tablet 3  . Magnesium 500 MG TABS Take 500 mg by mouth daily.     . metoprolol succinate (TOPROL-XL) 25 MG 24 hr tablet TAKE 1 TABLET DAILY (OVERDUE FOR OFFICE VISIT. CALL FOR APPOINTMENT/FUTURE REFILLS) 90 tablet 3  . Multiple Vitamin (MULTIVITAMIN) tablet Take 1 tablet by mouth daily.    Marland Kitchen NITROSTAT 0.4 MG SL tablet Place 0.4 mg under the tongue every 5 (five) minutes as needed for chest pain.     . rosuvastatin (CRESTOR) 40 MG tablet Take 1 tablet (40 mg total) by mouth daily. 90 tablet 1  . umeclidinium-vilanterol (ANORO ELLIPTA) 62.5-25 MCG/INH AEPB Inhale 1 puff into the lungs daily.     Current Facility-Administered Medications  Medication Dose Route Frequency Provider Last Rate Last Admin  . 0.9 %  sodium chloride infusion  500 mL Intravenous Once Armbruster, Carlota Raspberry, MD        Allergies:   Patient has no known allergies.    ROS:  Please see the history of present illness.   Otherwise, review of systems are positive for none.   All other systems are reviewed and negative.    PHYSICAL EXAM: VS:  BP 90/62 (BP Location: Left Arm, Patient Position: Sitting)   Pulse 69   Ht 5\' 6"  (1.676 m)   Wt 160 lb 6.4 oz (72.8 kg)   LMP  (LMP Unknown)   SpO2 98%   BMI 25.89 kg/m  , BMI Body mass index is 25.89 kg/m.  GENERAL:  Well appearing NECK:  No jugular venous distention, waveform within normal limits, carotid upstroke brisk and symmetric, no bruits, no thyromegaly LUNGS:  Clear to auscultation bilaterally CHEST:  Well healed sternotomy scar, well-healed ICD pocket HEART:  PMI not displaced or sustained,S1 and S2 within normal limits, no S3, no S4, no  clicks, no rubs, no murmurs ABD:  Flat, positive bowel sounds normal in frequency in pitch, no bruits, no rebound, no guarding, no midline pulsatile mass, no hepatomegaly, no splenomegaly EXT:  2 plus pulses throughout, no edema, no cyanosis no clubbing    EKG:  EKG is  ordered today. Sinus rhythm, demand atrial pacing, premature ventricular contractions, left bundle branch block  Recent Labs: 03/13/2020: BUN 15; Creatinine, Ser 1.02; Magnesium 2.3; Potassium 4.4; Sodium 142    Lipid Panel No results found for: CHOL, TRIG, HDL, CHOLHDL, VLDL, LDLCALC, LDLDIRECT    Wt Readings from Last 3 Encounters:  01/15/21 160  lb 6.4 oz (72.8 kg)  01/07/21 159 lb (72.1 kg)  09/17/20 156 lb 3.2 oz (70.9 kg)       Other studies Reviewed: Additional studies/ records that were reviewed today include:  Labs, device interrogation. Review of the above records demonstrates:   See elsewhere  ASSESSMENT AND PLAN:   CAD -    The patient has no new sypmtoms.  No further cardiovascular testing is indicated.  We will continue with aggressive risk reduction and meds as listed.  Ischemic CM -     Her EF has been 25 to 30%.  She would not tolerate med titration as her pressures often in the systolics 54H.  No change in therapy.   HL -     LDL was 43 with an HDL of 70.   ICD -     I did review her interrogation.  She has had no device firings.  She paces less than 3% of the time from the atria.  Her OptiVol index is slightly low earlier this year but got back to normal.   Current medicines are reviewed at length with the patient today.  The patient does not have concerns regarding medicines.  The following changes have been made:   None Labs/ tests ordered today include:  None    Orders Placed This Encounter  Procedures  . EKG 12-Lead     Disposition:   FU with with me 12 months.  She has follow-up with Dr. Curt Bears in September  Signed, Romonda Parker, MD  01/15/2021 8:31 AM    [place]

## 2021-01-15 ENCOUNTER — Other Ambulatory Visit: Payer: Self-pay

## 2021-01-15 ENCOUNTER — Encounter: Payer: Self-pay | Admitting: Cardiology

## 2021-01-15 ENCOUNTER — Ambulatory Visit (INDEPENDENT_AMBULATORY_CARE_PROVIDER_SITE_OTHER): Payer: Medicare Other | Admitting: Cardiology

## 2021-01-15 VITALS — BP 90/62 | HR 69 | Ht 66.0 in | Wt 160.4 lb

## 2021-01-15 DIAGNOSIS — I2510 Atherosclerotic heart disease of native coronary artery without angina pectoris: Secondary | ICD-10-CM | POA: Diagnosis not present

## 2021-01-15 DIAGNOSIS — E785 Hyperlipidemia, unspecified: Secondary | ICD-10-CM

## 2021-01-15 DIAGNOSIS — Z95810 Presence of automatic (implantable) cardiac defibrillator: Secondary | ICD-10-CM | POA: Diagnosis not present

## 2021-01-15 DIAGNOSIS — I255 Ischemic cardiomyopathy: Secondary | ICD-10-CM

## 2021-01-15 NOTE — Patient Instructions (Signed)
Medication Instructions:  No changes *If you need a refill on your cardiac medications before your next appointment, please call your pharmacy*  Follow-Up: At [facility], you and your health needs are our priority.  As part of our continuing mission to provide you with exceptional heart care, we have created designated Provider Care Teams.  These Care Teams include your primary Cardiologist (physician) and Advanced Practice Providers (APPs -  Physician Assistants and Nurse Practitioners) who all work together to provide you with the care you need, when you need it.  Your next appointment:   March 2023 You will receive a reminder letter in the mail two months in advance. If you don't receive a letter, please call our office to schedule the follow-up appointment.  The format for your next appointment:   In Person  Provider:   Minus Breeding, MD  Other Instructions You will see Dr. Curt Bears in September 2022 and Dr. Percival Spanish in March 2023

## 2021-01-18 DIAGNOSIS — M7542 Impingement syndrome of left shoulder: Secondary | ICD-10-CM | POA: Diagnosis not present

## 2021-01-25 ENCOUNTER — Ambulatory Visit (INDEPENDENT_AMBULATORY_CARE_PROVIDER_SITE_OTHER): Payer: Medicare Other

## 2021-01-25 DIAGNOSIS — Z95810 Presence of automatic (implantable) cardiac defibrillator: Secondary | ICD-10-CM | POA: Diagnosis not present

## 2021-01-25 DIAGNOSIS — I5022 Chronic systolic (congestive) heart failure: Secondary | ICD-10-CM

## 2021-01-26 ENCOUNTER — Telehealth: Payer: Self-pay

## 2021-01-26 NOTE — Telephone Encounter (Signed)
Remote ICM transmission received.  Attempted call to patient regarding ICM remote transmission and left detailed message per DPR.  Advised to return call for any fluid symptoms or questions. Next ICM remote transmission scheduled 03/02/2021.

## 2021-01-26 NOTE — Progress Notes (Signed)
EPIC Encounter for ICM Monitoring  Patient Name: Hailey Wright is a 75 y.o. female Date: 01/26/2021 Primary Care Physican: Leanna Battles, MD Primary Cardiologist: Hochrein Electrophysiologist: Curt Bears 12/23/2020 Weight: 153lbs  Attempted call to patient and unable to reach.  Left detailed message per DPR regarding transmission. Transmission reviewed.   Corvue thoracic impedancenormal.  Prescribed:Furosemide 40 mg 1 tablet daily as needed. Shetakes 0.5 tablet as neededsince 40 mg tends to make her legs cramp.  Labs: 03/13/2020 Creatinine 1.02, BUN 15, Potassium 4.4, Sodium 142, GFR 55-64 A complete set of results can be found in Results Review.  Recommendations:Left voice mail with ICM number and encouraged to call if experiencing any fluid symptoms.  Follow-up plan: ICM clinic phone appointment on4/10/2021. 91 day device clinic remote transmission4/09/2021.   EP/Cardiology Office Visits: Recall 07/23/2021 with Dr Curt Bears.  Recall 3/20/2023with Dr. Percival Spanish.   Copy of ICM check sent to [facility].  3 month ICM trend: 01/25/2021.    1 Year ICM trend:       [name], RN 01/26/2021 2:00 PM

## 2021-03-01 ENCOUNTER — Ambulatory Visit (INDEPENDENT_AMBULATORY_CARE_PROVIDER_SITE_OTHER): Payer: Medicare Other

## 2021-03-01 DIAGNOSIS — I255 Ischemic cardiomyopathy: Secondary | ICD-10-CM | POA: Diagnosis not present

## 2021-03-02 ENCOUNTER — Ambulatory Visit (INDEPENDENT_AMBULATORY_CARE_PROVIDER_SITE_OTHER): Payer: Medicare Other

## 2021-03-02 DIAGNOSIS — I5022 Chronic systolic (congestive) heart failure: Secondary | ICD-10-CM | POA: Diagnosis not present

## 2021-03-02 DIAGNOSIS — Z95810 Presence of automatic (implantable) cardiac defibrillator: Secondary | ICD-10-CM

## 2021-03-02 LAB — CUP PACEART REMOTE DEVICE CHECK
Battery Remaining Longevity: 50 mo
Battery Remaining Percentage: 52 %
Battery Voltage: 2.93 V
Brady Statistic AP VP Percent: 1 %
Brady Statistic AP VS Percent: 2 %
Brady Statistic AS VP Percent: 1 %
Brady Statistic AS VS Percent: 98 %
Brady Statistic RA Percent Paced: 1.3 %
Brady Statistic RV Percent Paced: 1 %
Date Time Interrogation Session: 20220411020017
HighPow Impedance: 72 Ohm
HighPow Impedance: 72 Ohm
Implantable Lead Implant Date: 20170727
Implantable Lead Implant Date: 20170727
Implantable Lead Location: 753859
Implantable Lead Location: 753860
Implantable Pulse Generator Implant Date: 20170727
Lead Channel Impedance Value: 390 Ohm
Lead Channel Impedance Value: 430 Ohm
Lead Channel Pacing Threshold Amplitude: 0.5 V
Lead Channel Pacing Threshold Amplitude: 1 V
Lead Channel Pacing Threshold Pulse Width: 0.5 ms
Lead Channel Pacing Threshold Pulse Width: 0.5 ms
Lead Channel Sensing Intrinsic Amplitude: 12 mV
Lead Channel Sensing Intrinsic Amplitude: 3.8 mV
Lead Channel Setting Pacing Amplitude: 2 V
Lead Channel Setting Pacing Amplitude: 2.5 V
Lead Channel Setting Pacing Pulse Width: 0.5 ms
Lead Channel Setting Sensing Sensitivity: 0.5 mV
Pulse Gen Serial Number: 7261095

## 2021-03-03 DIAGNOSIS — D2371 Other benign neoplasm of skin of right lower limb, including hip: Secondary | ICD-10-CM | POA: Diagnosis not present

## 2021-03-03 DIAGNOSIS — L570 Actinic keratosis: Secondary | ICD-10-CM | POA: Diagnosis not present

## 2021-03-03 DIAGNOSIS — L821 Other seborrheic keratosis: Secondary | ICD-10-CM | POA: Diagnosis not present

## 2021-03-03 DIAGNOSIS — D1801 Hemangioma of skin and subcutaneous tissue: Secondary | ICD-10-CM | POA: Diagnosis not present

## 2021-03-03 DIAGNOSIS — L814 Other melanin hyperpigmentation: Secondary | ICD-10-CM | POA: Diagnosis not present

## 2021-03-03 NOTE — Progress Notes (Signed)
EPIC Encounter for ICM Monitoring  Patient Name: Hailey Wright is a 75 y.o. female Date: 03/03/2021 Primary Care Physican: Leanna Battles, MD Primary Cardiologist: Hochrein Electrophysiologist: Curt Bears 4/13/2022Weight: 160lbs  Spoke with patient and reports feeling well at this time.  Denies fluid symptoms.  She took PRN Furosemide during decreased impedance.  Corvue thoracic impedancenormal.  Prescribed:Furosemide 40 mg 1 tablet daily as needed. Shetakes 0.5 tablet as neededsince 40 mg tends to make her legs cramp.  Labs: 03/13/2020 Creatinine 1.02, BUN 15, Potassium 4.4, Sodium 142, GFR 55-64 A complete set of results can be found in Results Review.  Recommendations:No changes and encouraged to call if experiencing any fluid symptoms.  Follow-up plan: ICM clinic phone appointment on5/24/2022. 91 day device clinic remote transmission7/09/2021.  EP/Cardiology Office Visits: Recall 07/23/2021 with Dr Curt Bears.  Recall 3/20/2023with Dr. Percival Spanish.   Copy of ICM check sent to [facility].  3 month ICM trend: 03/01/2021.    1 Year ICM trend:       [name], RN 03/03/2021 8:08 AM

## 2021-03-10 DIAGNOSIS — M25512 Pain in left shoulder: Secondary | ICD-10-CM | POA: Diagnosis not present

## 2021-03-15 NOTE — Progress Notes (Signed)
Remote ICD transmission.   

## 2021-04-12 ENCOUNTER — Ambulatory Visit (INDEPENDENT_AMBULATORY_CARE_PROVIDER_SITE_OTHER): Payer: Medicare Other

## 2021-04-12 DIAGNOSIS — I5022 Chronic systolic (congestive) heart failure: Secondary | ICD-10-CM

## 2021-04-12 DIAGNOSIS — Z95810 Presence of automatic (implantable) cardiac defibrillator: Secondary | ICD-10-CM

## 2021-04-12 NOTE — Progress Notes (Signed)
EPIC Encounter for ICM Monitoring  Patient Name: Hailey Wright is a 75 y.o. female Date: 04/12/2021 Primary Care Physican: Leanna Battles, MD Primary Cardiologist: Hochrein Electrophysiologist: Curt Bears 04/12/2021 Weight: 160  Spoke with patient and reports feeling well at this time.  Denies fluid symptoms.  She was out of town during decreased impedance 5/9-5/12.   Corvue thoracic impedancesuggesting possible fluid accumulation 04/09/2021.  Prescribed:Furosemide 40 mg 1 tablet daily as needed. Shetakes 0.5 tablet as neededsince 40 mg tends to make her legs cramp.  Labs: 12/10/2020 Creatinine 1.1, BUN 17, Potassium 5.0, Sodium 139, GFR 59.1 A complete set of results can be found in Results Review.  Recommendations:She will take PRN Furosemide x 1 day.    Follow-up plan: ICM clinic phone appointment on5/31/2022 to recheck fluid levels. 91 day device clinic remote transmission7/09/2021.  EP/Cardiology Office Visits: Recall 07/23/2021 with Dr Curt Bears. Recall 3/20/2023with Dr. Percival Spanish.   Copy of ICM check sent to [facility].  3 month ICM trend: 04/12/2021.    1 Year ICM trend:       [name], RN 04/12/2021 4:08 PM

## 2021-04-20 ENCOUNTER — Ambulatory Visit (INDEPENDENT_AMBULATORY_CARE_PROVIDER_SITE_OTHER): Payer: Medicare Other

## 2021-04-20 DIAGNOSIS — Z95810 Presence of automatic (implantable) cardiac defibrillator: Secondary | ICD-10-CM

## 2021-04-20 DIAGNOSIS — I5022 Chronic systolic (congestive) heart failure: Secondary | ICD-10-CM

## 2021-04-20 NOTE — Progress Notes (Signed)
EPIC Encounter for ICM Monitoring  Patient Name: Hailey Wright is a 74 y.o. female Date: 04/20/2021 Primary Care Physican: Leanna Battles, MD Primary Cardiologist: Hochrein Electrophysiologist: Curt Bears 04/12/2021 Weight: 160  Spoke with patient and reports feeling well at this time. Denies fluid symptoms.   Corvue thoracic impedancesuggesting fluid levels returned to normal after taking 1 day of PRN Furosemide.  Prescribed:Furosemide 40 mg 1 tablet daily as needed. Shetakes 0.5 tablet as neededsince 40 mg tends to make her legs cramp.  Labs: 12/10/2020 Creatinine 1.1, BUN 17, Potassium 5.0, Sodium 139, GFR 59.1 A complete set of results can be found in Results Review.  Recommendations:No changes and encouraged to call if experiencing any fluid symptoms.  Follow-up plan: ICM clinic phone appointment on6/27/2022. 91 day device clinic remote transmission7/09/2021.  EP/Cardiology Office Visits: Recall 07/23/2021 with Dr Curt Bears. Recall 3/20/2023with Dr. Percival Spanish.   Copy of ICM check sent to [facility].  3 month ICM trend: 04/20/2021.    1 Year ICM trend:       [name], RN 04/20/2021 2:31 PM

## 2021-04-23 DIAGNOSIS — M25512 Pain in left shoulder: Secondary | ICD-10-CM | POA: Diagnosis not present

## 2021-04-26 ENCOUNTER — Ambulatory Visit (INDEPENDENT_AMBULATORY_CARE_PROVIDER_SITE_OTHER): Payer: Medicare Other | Admitting: Diagnostic Neuroimaging

## 2021-04-26 ENCOUNTER — Encounter: Payer: Self-pay | Admitting: *Deleted

## 2021-04-26 VITALS — BP 91/55 | HR 87 | Ht 66.0 in | Wt 164.2 lb

## 2021-04-26 DIAGNOSIS — I255 Ischemic cardiomyopathy: Secondary | ICD-10-CM

## 2021-04-26 DIAGNOSIS — G250 Essential tremor: Secondary | ICD-10-CM | POA: Diagnosis not present

## 2021-04-26 NOTE — Progress Notes (Signed)
[facility]  PATIENT: Hailey Wright DOB: 1946/02/17  REFERRING CLINICIAN: Leanna Battles, MD HISTORY FROM: patient  REASON FOR VISIT: new consult    HISTORICAL  CHIEF COMPLAINT:  Chief Complaint  Patient presents with  . Tremors    Rm 7 New Pt "left hand tremor, getting more pronounced- began last fall; feel like I'm in a fog a lot"    HISTORY OF PRESENT ILLNESS:   75 year old female here for evaluation of tremor.  Symptoms started in fall 2021 with mild tremor in left hand.  Patient noticed this when fastening necklace and earrings and other fine motor tasks.  Sometimes this would occur when holding a bowl or utensils.  She is also noticing changes in her voice.  No family history of tremor.  No triggering or aggravating factors.   REVIEW OF SYSTEMS: Full 14 system review of systems performed and negative with exception of: as per HPI.  ALLERGIES: No Known Allergies  HOME MEDICATIONS: Outpatient Medications Prior to Visit  Medication Sig Dispense Refill  . aspirin 81 MG tablet Take 81 mg by mouth daily.    . calcium-vitamin D (OSCAL WITH D) 500-200 MG-UNIT per tablet Take 1 tablet by mouth 2 (two) times daily.    . carboxymethylcellulose (REFRESH PLUS) 0.5 % SOLN Place 2 drops into both eyes daily as needed (for dry eyes).     Marland Kitchen Co-Enzyme Q10 200 MG CAPS Take 200 mg by mouth daily.     Marland Kitchen ENTRESTO 24-26 MG TAKE 1 TABLET TWICE A DAY 180 tablet 3  . ezetimibe (ZETIA) 10 MG tablet Take 10 mg by mouth daily.    . furosemide (LASIX) 40 MG tablet TAKE 1 TABLET BY MOUTH AS NEEDED 30 tablet 3  . isosorbide mononitrate (IMDUR) 30 MG 24 hr tablet TAKE 1 TABLET DAILY 90 tablet 3  . Magnesium 500 MG TABS Take 500 mg by mouth daily.     . metoprolol succinate (TOPROL-XL) 25 MG 24 hr tablet TAKE 1 TABLET DAILY (OVERDUE FOR OFFICE VISIT. CALL FOR APPOINTMENT/FUTURE REFILLS) 90 tablet 3  . Multiple Vitamin (MULTIVITAMIN) tablet Take 1 tablet by mouth daily.    Marland Kitchen  NITROSTAT 0.4 MG SL tablet Place 0.4 mg under the tongue every 5 (five) minutes as needed for chest pain.     . rosuvastatin (CRESTOR) 40 MG tablet Take 1 tablet (40 mg total) by mouth daily. 90 tablet 1  . umeclidinium-vilanterol (ANORO ELLIPTA) 62.5-25 MCG/INH AEPB Inhale 1 puff into the lungs daily.     Facility-Administered Medications Prior to Visit  Medication Dose Route Frequency Provider Last Rate Last Admin  . 0.9 %  sodium chloride infusion  500 mL Intravenous Once Armbruster, Carlota Raspberry, MD        PAST MEDICAL HISTORY: Past Medical History:  Diagnosis Date  . Acute ST elevation myocardial infarction (STEMI) involving left anterior descending coronary artery ([place]) 02/18/2016  . AICD (automatic cardioverter/defibrillator) present   . CAD (coronary artery disease) 06/04/2013  . CHF (congestive heart failure) ([place])   . Clotting disorder ([place])   . COPD (chronic obstructive pulmonary disease) ([place])   . Coronary artery thrombosis ([place]) 02/18/2016  . DVT (deep venous thrombosis) ([place]) 1980s   RLE  . Hepatitis A infection 1987  . Hyperlipidemia   . Hypertension   . Ischemic cardiomyopathy    a. Echo 5/17: EF 25-30%, anteroseptal, anterior, anteroseptal and apical akinesis, possible inferior hypokinesis, grade 1 diastolic dysfunction, no evidence of thrombus, trivial AI, mild MR,  mild LAE  . MI (myocardial infarction) ([place]) 1991   Inferior treated with TPA 1991, POBA RCA 1996  . Pneumonia 1980s X 1  . RAS (renal artery stenosis) ([facility])    Right renal stent 2005  . S/P Emergency CABG x 2 02/18/2016   LIMA to LAD, SVG to OM1, EVH via right thigh  . Type II diabetes mellitus ([place])    pt denies    PAST SURGICAL HISTORY: Past Surgical History:  Procedure Laterality Date  . CARDIAC CATHETERIZATION N/A 02/18/2016   Procedure: Left Heart Cath and Coronary Angiography;  Surgeon: Leonie Man, MD;  Location: [place] [facility];  Service: Cardiovascular;  Laterality: N/A;  . CORONARY  ANGIOPLASTY  1991  . CORONARY ARTERY BYPASS GRAFT N/A 02/18/2016   Procedure: CORONARY ARTERY BYPASS GRAFTING (CABG) times 2 using left internal mammary and right greater saphenous vein;  Surgeon: Rexene Alberts, MD;  Location: [place];  Service: Open Heart Surgery;  Laterality: N/A;  . EP IMPLANTABLE DEVICE N/A 06/16/2016   Procedure: ICD Implant;  Surgeon: Will Meredith Leeds, MD;  Location: [place] [facility];  Service: Cardiovascular;  Laterality: N/A;  . RENAL ARTERY STENT Right 2005  . RHINOPLASTY Bilateral 1968  . SHOULDER SURGERY Right 2019   Dr Onnie Graham  . TONSILLECTOMY AND ADENOIDECTOMY  1950s    FAMILY HISTORY: Family History  Problem Relation Age of Onset  . Heart attack Father 29       Fatal MI 12  . Heart attack Brother 31  . Cancer Mother        metastatic from small intestine  . Colon cancer Neg Hx   . Esophageal cancer Neg Hx   . Rectal cancer Neg Hx   . Stomach cancer Neg Hx     SOCIAL HISTORY: Social History   Socioeconomic History  . Marital status: Married    Spouse name: Konrad Dolores  . Number of children: 1  . Years of education: Not on file  . Highest education level: Master's degree (e.g., MA, MS, MEng, MEd, MSW, MBA)  Occupational History    Comment: retired  Tobacco Use  . Smoking status: Former Smoker    Packs/day: 1.00    Years: 40.00    Pack years: 40.00    Types: Cigarettes    Start date: 11/26/1966    Quit date: 06/05/2011    Years since quitting: 9.8  . Smokeless tobacco: Never Used  Vaping Use  . Vaping Use: Never used  Substance and Sexual Activity  . Alcohol use: No  . Drug use: No  . Sexual activity: Not on file  Other Topics Concern  . Not on file  Social History Narrative   Lives with husband.   Caffeine- 3-4 c daily   Social Determinants of Health   Financial Resource Strain: Not on file  Food Insecurity: Not on file  Transportation Needs: Not on file  Physical Activity: Not on file  Stress: Not on file  Social Connections:  Not on file  Intimate Partner Violence: Not on file     PHYSICAL EXAM  GENERAL EXAM/CONSTITUTIONAL: Vitals:  Vitals:   04/26/21 1100  BP: (!) 91/55  Pulse: 87  Weight: 164 lb 3.2 oz (74.5 kg)  Height: 5\' 6"  (1.676 m)     Body mass index is 26.5 kg/m. Wt Readings from Last 3 Encounters:  04/26/21 164 lb 3.2 oz (74.5 kg)  01/15/21 160 lb 6.4 oz (72.8 kg)  01/07/21 159 lb (72.1 kg)  Patient is in no distress; well developed, nourished and groomed; neck is supple  CARDIOVASCULAR:  Examination of carotid arteries is normal; no carotid bruits  Regular rate and rhythm, no murmurs  Examination of peripheral vascular system by observation and palpation is normal  EYES:  Ophthalmoscopic exam of optic discs and posterior segments is normal; no papilledema or hemorrhages  No exam data present  MUSCULOSKELETAL:  Gait, strength, tone, movements noted in Neurologic exam below  NEUROLOGIC: MENTAL STATUS:  No flowsheet data found.  awake, alert, oriented to person, place and time  recent and remote memory intact  normal attention and concentration  language fluent, comprehension intact, naming intact  fund of knowledge appropriate  CRANIAL NERVE:   2nd - no papilledema on fundoscopic exam  2nd, 3rd, 4th, 6th - pupils equal and reactive to light, visual fields full to confrontation, extraocular muscles intact, no nystagmus  5th - facial sensation symmetric  7th - facial strength symmetric  8th - hearing intact  9th - palate elevates symmetrically, uvula midline  11th - shoulder shrug symmetric  12th - tongue protrusion midline  MILD VOICE TREMOR  MOTOR:   normal bulk and tone, full strength in the BUE, BLE  MILD POSTURAL AND ACTION TREMOR IN L > R UPPER EXT  NO RIGIDITY  NO BRADYKINESIA  SENSORY:   normal and symmetric to light touch, pinprick, temperature, vibration  COORDINATION:   finger-nose-finger, fine finger movements  normal  REFLEXES:   deep tendon reflexes present and symmetric  GAIT/STATION:   narrow based gait     DIAGNOSTIC DATA (LABS, IMAGING, TESTING) - I reviewed patient records, labs, notes, testing and imaging myself where available.  Lab Results  Component Value Date   WBC 6.3 03/22/2018   HGB 13.3 03/22/2018   HCT 41.1 03/22/2018   MCV 92.6 03/22/2018   PLT 163 03/22/2018      Component Value Date/Time   NA 142 03/13/2020 1119   K 4.4 03/13/2020 1119   CL 104 03/13/2020 1119   CO2 24 03/13/2020 1119   GLUCOSE 88 03/13/2020 1119   GLUCOSE 97 03/22/2018 1229   BUN 15 03/13/2020 1119   CREATININE 1.02 (H) 03/13/2020 1119   CREATININE 0.94 06/15/2016 1046   CALCIUM 10.2 03/13/2020 1119   PROT 5.6 (L) 02/18/2016 1945   ALBUMIN 3.1 (L) 02/18/2016 1945   AST 59 (H) 02/18/2016 1945   ALT 50 02/18/2016 1945   ALKPHOS 64 02/18/2016 1945   BILITOT 0.9 02/18/2016 1945   GFRNONAA 55 (L) 03/13/2020 1119   GFRAA 64 03/13/2020 1119   No results found for: CHOL, HDL, LDLCALC, LDLDIRECT, TRIG, CHOLHDL Lab Results  Component Value Date   HGBA1C 5.9 (H) 03/22/2018   No results found for: VITAMINB12 No results found for: TSH     ASSESSMENT AND PLAN  75 y.o. year old female here with mild action and postural tremor with otherwise normal neurologic exam.  Most consistent with essential tremor.  Symptoms are mild right now and not majorly affecting her ADLs.  Recommend to monitor.  Could consider medical treatment in the future.  Dx:  1. Essential tremor      PLAN:  ESSENTIAL TREMOR - monitor symptoms; may consider primidone in future  Return for return to PCP, pending if symptoms worsen or fail to improve.    Penni Bombard, MD 4/0/9811, 91:47 AM Certified in Neurology, Neurophysiology and Neuroimaging  [facility] [address], [place], [place] [address] [phone]

## 2021-04-26 NOTE — Patient Instructions (Signed)
ESSENTIAL TREMOR - monitor symptoms; may consider primidone in future

## 2021-05-03 DIAGNOSIS — R490 Dysphonia: Secondary | ICD-10-CM | POA: Diagnosis not present

## 2021-05-03 DIAGNOSIS — G250 Essential tremor: Secondary | ICD-10-CM | POA: Diagnosis not present

## 2021-05-03 DIAGNOSIS — I10 Essential (primary) hypertension: Secondary | ICD-10-CM | POA: Diagnosis not present

## 2021-05-03 DIAGNOSIS — I2510 Atherosclerotic heart disease of native coronary artery without angina pectoris: Secondary | ICD-10-CM | POA: Diagnosis not present

## 2021-05-03 DIAGNOSIS — I255 Ischemic cardiomyopathy: Secondary | ICD-10-CM | POA: Diagnosis not present

## 2021-05-03 DIAGNOSIS — J439 Emphysema, unspecified: Secondary | ICD-10-CM | POA: Diagnosis not present

## 2021-05-03 DIAGNOSIS — R1312 Dysphagia, oropharyngeal phase: Secondary | ICD-10-CM | POA: Diagnosis not present

## 2021-05-03 DIAGNOSIS — E785 Hyperlipidemia, unspecified: Secondary | ICD-10-CM | POA: Diagnosis not present

## 2021-05-03 DIAGNOSIS — R739 Hyperglycemia, unspecified: Secondary | ICD-10-CM | POA: Diagnosis not present

## 2021-05-17 ENCOUNTER — Ambulatory Visit (INDEPENDENT_AMBULATORY_CARE_PROVIDER_SITE_OTHER): Payer: Medicare Other

## 2021-05-17 DIAGNOSIS — Z95810 Presence of automatic (implantable) cardiac defibrillator: Secondary | ICD-10-CM | POA: Diagnosis not present

## 2021-05-17 DIAGNOSIS — I5022 Chronic systolic (congestive) heart failure: Secondary | ICD-10-CM | POA: Diagnosis not present

## 2021-05-18 NOTE — Progress Notes (Signed)
EPIC Encounter for ICM Monitoring  Patient Name: Hailey Wright is a 75 y.o. female Date: 05/18/2021 Primary Care Physican: Leanna Battles, MD Primary Cardiologist: Hochrein Electrophysiologist: Curt Bears 05/18/2021 Weight: 160   Spoke with patient and reports feeling well at this time.  Denies fluid symptoms.  She has taken Furosemide during decreased impedance as needed   Corvue thoracic impedance normal fluid levels.     Prescribed: Furosemide 40 mg 1 tablet daily as needed.  She takes 0.5 tablet as needed since 40 mg tends to make her legs cramp.    Labs: 12/10/2020 Creatinine 1.1, BUN 17, Potassium 5.0, Sodium 139, GFR 59.1 A complete set of results can be found in Results Review.   Recommendations:  No changes and encouraged to call if experiencing any fluid symptoms.   Follow-up plan: ICM clinic phone appointment on 06/21/2021.   91 day device clinic remote transmission 05/31/2021.     EP/Cardiology Office Visits:  Recall 07/23/2021 with Dr Curt Bears.  Recall 02/07/2022 with Dr. Percival Spanish.     Copy of ICM check sent to Dr. Curt Bears.    3 month ICM trend: 05/17/2021.    1 Year ICM trend:       [name], RN 05/18/2021 1:45 PM

## 2021-05-31 ENCOUNTER — Ambulatory Visit (INDEPENDENT_AMBULATORY_CARE_PROVIDER_SITE_OTHER): Payer: Medicare Other

## 2021-05-31 DIAGNOSIS — I255 Ischemic cardiomyopathy: Secondary | ICD-10-CM

## 2021-05-31 LAB — CUP PACEART REMOTE DEVICE CHECK
Battery Remaining Longevity: 49 mo
Battery Remaining Percentage: 50 %
Battery Voltage: 2.92 V
Brady Statistic AP VP Percent: 1 %
Brady Statistic AP VS Percent: 1.7 %
Brady Statistic AS VP Percent: 1 %
Brady Statistic AS VS Percent: 98 %
Brady Statistic RA Percent Paced: 1 %
Brady Statistic RV Percent Paced: 1 %
Date Time Interrogation Session: 20220711020032
HighPow Impedance: 80 Ohm
HighPow Impedance: 80 Ohm
Implantable Lead Implant Date: 20170727
Implantable Lead Implant Date: 20170727
Implantable Lead Location: 753859
Implantable Lead Location: 753860
Implantable Pulse Generator Implant Date: 20170727
Lead Channel Impedance Value: 410 Ohm
Lead Channel Impedance Value: 510 Ohm
Lead Channel Pacing Threshold Amplitude: 0.5 V
Lead Channel Pacing Threshold Amplitude: 1 V
Lead Channel Pacing Threshold Pulse Width: 0.5 ms
Lead Channel Pacing Threshold Pulse Width: 0.5 ms
Lead Channel Sensing Intrinsic Amplitude: 12 mV
Lead Channel Sensing Intrinsic Amplitude: 5 mV
Lead Channel Setting Pacing Amplitude: 2 V
Lead Channel Setting Pacing Amplitude: 2.5 V
Lead Channel Setting Pacing Pulse Width: 0.5 ms
Lead Channel Setting Sensing Sensitivity: 0.5 mV
Pulse Gen Serial Number: 7261095

## 2021-06-01 ENCOUNTER — Telehealth (HOSPITAL_COMMUNITY): Payer: Self-pay | Admitting: *Deleted

## 2021-06-01 NOTE — Telephone Encounter (Signed)
Left VM requesting a call back to schedule an OP MBS. RKEEL

## 2021-06-02 ENCOUNTER — Other Ambulatory Visit (HOSPITAL_COMMUNITY): Payer: Self-pay

## 2021-06-02 DIAGNOSIS — R059 Cough, unspecified: Secondary | ICD-10-CM

## 2021-06-02 DIAGNOSIS — R1310 Dysphagia, unspecified: Secondary | ICD-10-CM

## 2021-06-09 ENCOUNTER — Ambulatory Visit (HOSPITAL_COMMUNITY): Payer: Medicare Other

## 2021-06-09 ENCOUNTER — Encounter (HOSPITAL_COMMUNITY): Payer: Self-pay

## 2021-06-09 ENCOUNTER — Encounter (HOSPITAL_COMMUNITY): Payer: Medicare Other

## 2021-06-15 ENCOUNTER — Other Ambulatory Visit: Payer: Self-pay

## 2021-06-15 ENCOUNTER — Ambulatory Visit (HOSPITAL_COMMUNITY)
Admission: RE | Admit: 2021-06-15 | Discharge: 2021-06-15 | Disposition: A | Discharge status: Home / Self Care | Payer: Medicare Other | Source: Ambulatory Visit | Attending: Internal Medicine | Admitting: Internal Medicine

## 2021-06-15 DIAGNOSIS — R059 Cough, unspecified: Secondary | ICD-10-CM | POA: Diagnosis not present

## 2021-06-15 DIAGNOSIS — R1310 Dysphagia, unspecified: Secondary | ICD-10-CM | POA: Diagnosis not present

## 2021-06-21 ENCOUNTER — Ambulatory Visit (INDEPENDENT_AMBULATORY_CARE_PROVIDER_SITE_OTHER): Payer: Medicare Other

## 2021-06-21 DIAGNOSIS — I5022 Chronic systolic (congestive) heart failure: Secondary | ICD-10-CM | POA: Diagnosis not present

## 2021-06-21 DIAGNOSIS — Z95810 Presence of automatic (implantable) cardiac defibrillator: Secondary | ICD-10-CM | POA: Diagnosis not present

## 2021-06-22 NOTE — Progress Notes (Signed)
EPIC Encounter for ICM Monitoring  Patient Name: Hailey Wright is a 75 y.o. female Date: 06/22/2021 Primary Care Physican: Leanna Battles, MD Primary Cardiologist: Hochrein Electrophysiologist: Curt Bears 06/22/2021 Weight: 159   Spoke with patient and reports feeling well at this time.  Denies fluid symptoms.  She has taken Furosemide several times in the past month.   Corvue thoracic impedance normal fluid levels suggesting possible fluid accumulation from 7/20-7/26.   Prescribed: Furosemide 40 mg 1 tablet daily as needed.  She takes 0.5 tablet as needed since 40 mg tends to make her legs cramp.    Labs: 12/10/2020 Creatinine 1.1, BUN 17, Potassium 5.0, Sodium 139, GFR 59.1 A complete set of results can be found in Results Review.   Recommendations:  No changes and encouraged to call if experiencing any fluid symptoms.   Follow-up plan: ICM clinic phone appointment on 07/22/2021.   91 day device clinic remote transmission 08/30/2021.     EP/Cardiology Office Visits:  Recall 07/23/2021 with Dr Curt Bears.  Recall 02/07/2022 with Dr. Percival Spanish.     Copy of ICM check sent to Dr. Curt Bears.     3 month ICM trend: 06/21/2021.    1 Year ICM trend:       [name], RN 06/22/2021 3:27 PM

## 2021-06-23 NOTE — Progress Notes (Signed)
Remote ICD transmission.   

## 2021-06-28 DIAGNOSIS — J3489 Other specified disorders of nose and nasal sinuses: Secondary | ICD-10-CM | POA: Diagnosis not present

## 2021-06-28 DIAGNOSIS — R1310 Dysphagia, unspecified: Secondary | ICD-10-CM | POA: Diagnosis not present

## 2021-06-28 DIAGNOSIS — K219 Gastro-esophageal reflux disease without esophagitis: Secondary | ICD-10-CM | POA: Diagnosis not present

## 2021-07-22 ENCOUNTER — Ambulatory Visit (INDEPENDENT_AMBULATORY_CARE_PROVIDER_SITE_OTHER): Payer: Medicare Other

## 2021-07-22 DIAGNOSIS — Z95810 Presence of automatic (implantable) cardiac defibrillator: Secondary | ICD-10-CM

## 2021-07-22 DIAGNOSIS — I5022 Chronic systolic (congestive) heart failure: Secondary | ICD-10-CM

## 2021-07-23 NOTE — Progress Notes (Signed)
EPIC Encounter for ICM Monitoring  Patient Name: Hailey Wright is a 75 y.o. female Date: 07/23/2021 Primary Care Physican: Leanna Battles, MD Primary Cardiologist: Hochrein Electrophysiologist: Curt Bears 07/23/2021 Weight: 154 lbs   Spoke with patient and heart failure questions reviewed.  Pt asymptomatic for fluid accumulation and feeling well.   Corvue thoracic impedance normal fluid levels suggesting possible fluid accumulation from 8/16 - 8/19 and 8/24 - 8/28.   Prescribed: Furosemide 40 mg 1 tablet daily as needed.  She takes 0.5 tablet as needed since 40 mg tends to make her legs cramp.    Labs: 12/10/2020 Creatinine 1.1, BUN 17, Potassium 5.0, Sodium 139, GFR 59.1 A complete set of results can be found in Results Review.   Recommendations:  No changes and encouraged to call if experiencing any fluid symptoms.   Follow-up plan: ICM clinic phone appointment on 08/31/2021.   91 day device clinic remote transmission 08/30/2021.     EP/Cardiology Office Visit:   08/10/2021 with Dr Curt Bears.  Recall 02/07/2022 with Dr. Percival Spanish.     Copy of ICM check sent to Dr. Curt Bears.   3 month ICM trend: 07/22/2021.    1 Year ICM trend:       [name], RN 07/23/2021 8:58 AM

## 2021-08-10 ENCOUNTER — Other Ambulatory Visit: Payer: Self-pay

## 2021-08-10 ENCOUNTER — Encounter: Payer: Self-pay | Admitting: Cardiology

## 2021-08-10 ENCOUNTER — Ambulatory Visit (INDEPENDENT_AMBULATORY_CARE_PROVIDER_SITE_OTHER): Payer: Medicare Other | Admitting: Cardiology

## 2021-08-10 VITALS — BP 112/64 | HR 76 | Ht 66.0 in | Wt 154.0 lb

## 2021-08-10 DIAGNOSIS — I255 Ischemic cardiomyopathy: Secondary | ICD-10-CM | POA: Diagnosis not present

## 2021-08-10 NOTE — Progress Notes (Signed)
Electrophysiology Office Note   Date:  08/10/2021   ID:  Hailey Wright, Hailey Wright 1946/11/01, MRN [id number]  PCP:  Leanna Battles, MD  Cardiologist:  Hochrein Primary Electrophysiologist:  Dwayne Bulkley Meredith Leeds, MD    No chief complaint on file.    History of Present Illness: Hailey Wright is a 75 y.o. female who presents today for electrophysiology evaluation.     She has a history significant for coronary artery disease with ischemic cardiomyopathy.  In March 2017 she had emergent CABG with LIMA to the LAD and saphenous vein graft to the OM1.  She has an ejection fraction of approximately 30%.  She is status post Hailey Wright dual-chamber implanted 06/17/2016.  Today, denies symptoms of palpitations, shortness of breath, orthopnea, PND, lower extremity edema, claudication, dizziness, presyncope, syncope, bleeding, or neurologic sequela. The patient is tolerating medications without difficulties.  She overall feels well.  For the most part, she has no major complaints.  She has joined a walking group.  She walks in a parking.  When she walks uphill with his group, she gets some chest heaviness.  She also walks on a treadmill, and can walk up steady inclines on her treadmill and approximately 2-1/2 mph which do not cause her any discomfort.  Walking downhill outside and on flat ground outside does not cause her any issues either.   Past Medical History:  Diagnosis Date   Acute ST elevation myocardial infarction (STEMI) involving left anterior descending coronary artery (HCC) 02/18/2016   AICD (automatic cardioverter/defibrillator) present    CAD (coronary artery disease) 06/04/2013   CHF (congestive heart failure) (HCC)    Clotting disorder (HCC)    COPD (chronic obstructive pulmonary disease) (HCC)    Coronary artery thrombosis ([place]) 02/18/2016   DVT (deep venous thrombosis) ([place]) 1980s   RLE   Hepatitis A infection 1987   Hyperlipidemia    Hypertension    Ischemic cardiomyopathy    a.  Echo 5/17: EF 25-30%, anteroseptal, anterior, anteroseptal and apical akinesis, possible inferior hypokinesis, grade 1 diastolic dysfunction, no evidence of thrombus, trivial AI, mild MR, mild LAE   MI (myocardial infarction) ([place]) 1991   Inferior treated with TPA 1991, POBA RCA 1996   Pneumonia 1980s X 1   RAS (renal artery stenosis) ([place])    Right renal stent 2005   S/P Emergency CABG x 2 02/18/2016   LIMA to LAD, SVG to OM1, EVH via right thigh   Type II diabetes mellitus ([place])    pt denies   Past Surgical History:  Procedure Laterality Date   CARDIAC CATHETERIZATION N/A 02/18/2016   Procedure: Left Heart Cath and Coronary Angiography;  Surgeon: Leonie Man, MD;  Location: [facility];  Service: Cardiovascular;  Laterality: N/A;   CORONARY ANGIOPLASTY  1991   CORONARY ARTERY BYPASS GRAFT N/A 02/18/2016   Procedure: CORONARY ARTERY BYPASS GRAFTING (CABG) times 2 using left internal mammary and right greater saphenous vein;  Surgeon: Rexene Alberts, MD;  Location: [facility];  Service: Open Heart Surgery;  Laterality: N/A;   EP IMPLANTABLE DEVICE N/A 06/16/2016   Procedure: Wright Implant;  Surgeon: Asanti Craigo Meredith Leeds, MD;  Location: [facility];  Service: Cardiovascular;  Laterality: N/A;   RENAL ARTERY STENT Right 2005   RHINOPLASTY Bilateral 1968   SHOULDER SURGERY Right 2019   Dr Onnie Graham   TONSILLECTOMY AND ADENOIDECTOMY  1950s     Current Outpatient Medications  Medication Sig Dispense Refill   albuterol (VENTOLIN HFA)  108 (90 Base) MCG/ACT inhaler Inhale into the lungs every 6 (six) hours as needed for wheezing or shortness of breath.     aspirin 81 MG tablet Take 81 mg by mouth daily.     calcium-vitamin D (OSCAL WITH D) 500-200 MG-UNIT per tablet Take 1 tablet by mouth 2 (two) times daily.     carboxymethylcellulose (REFRESH PLUS) 0.5 % SOLN Place 2 drops into both eyes daily as needed (for dry eyes).      Co-Enzyme Q10 200 MG CAPS Take 200 mg by mouth daily.       ENTRESTO 24-26 MG TAKE 1 TABLET TWICE A DAY 180 tablet 3   ezetimibe (ZETIA) 10 MG tablet Take 10 mg by mouth daily.     furosemide (LASIX) 40 MG tablet TAKE 1 TABLET BY MOUTH AS NEEDED 30 tablet 3   isosorbide mononitrate (IMDUR) 30 MG 24 hr tablet TAKE 1 TABLET DAILY 90 tablet 3   Magnesium 500 MG TABS Take 500 mg by mouth daily.      metoprolol succinate (TOPROL-XL) 25 MG 24 hr tablet TAKE 1 TABLET DAILY (OVERDUE FOR OFFICE VISIT. CALL FOR APPOINTMENT/FUTURE REFILLS) 90 tablet 3   Multiple Vitamin (MULTIVITAMIN) tablet Take 1 tablet by mouth daily.     NITROSTAT 0.4 MG SL tablet Place 0.4 mg under the tongue every 5 (five) minutes as needed for chest pain.      rosuvastatin (CRESTOR) 40 MG tablet Take 1 tablet (40 mg total) by mouth daily. 90 tablet 1   umeclidinium-vilanterol (ANORO ELLIPTA) 62.5-25 MCG/INH AEPB Inhale 1 puff into the lungs daily.     Current Facility-Administered Medications  Medication Dose Route Frequency Provider Last Rate Last Admin   0.9 %  sodium chloride infusion  500 mL Intravenous Once Armbruster, Carlota Raspberry, MD        Allergies:   Patient has no known allergies.   Social History:  The patient  reports that she quit smoking about 10 years ago. Her smoking use included cigarettes. She started smoking about 54 years ago. She has a 40.00 pack-year smoking history. She has never used smokeless tobacco. She reports that she does not drink alcohol and does not use drugs.   Family History:  The patient's family history includes Cancer in her mother; Heart attack (age of onset: 48) in her brother; Heart attack (age of onset: 51) in her father.   ROS:  Please see the history of present illness.   Otherwise, review of systems is positive for none.   All other systems are reviewed and negative.   PHYSICAL EXAM: VS:  BP 112/64   Pulse 76   Ht 5\' 6"  (1.676 m)   Wt 154 lb (69.9 kg)   LMP  (LMP Unknown)   SpO2 96%   BMI 24.86 kg/m  , BMI Body mass index is 24.86  kg/m. GEN: Well nourished, well developed, in no acute distress  HEENT: normal  Neck: no JVD, carotid bruits, or masses Cardiac: RRR; no murmurs, rubs, or gallops,no edema  Respiratory:  clear to auscultation bilaterally, normal work of breathing GI: soft, nontender, nondistended, + BS MS: no deformity or atrophy  Skin: warm and dry, device site well healed Neuro:  Strength and sensation are intact Psych: euthymic mood, full affect  EKG:  EKG is ordered today. Personal review of the ekg ordered shows sinus rhythm, rate 76, left bundle branch block  Personal review of the device interrogation today. Results in [place]:  No results found for requested labs within last 8760 hours.    Lipid Panel  No results found for: CHOL, TRIG, HDL, CHOLHDL, VLDL, LDLCALC, LDLDIRECT   Wt Readings from Last 3 Encounters:  08/10/21 154 lb (69.9 kg)  04/26/21 164 lb 3.2 oz (74.5 kg)  01/15/21 160 lb 6.4 oz (72.8 kg)      Other studies Reviewed: Additional studies/ records that were reviewed today include:  TTE 05/20/16 - Left ventricle: The cavity size was mildly dilated. Wall   thickness was normal. Systolic function was severely reduced. The   estimated ejection fraction was in the range of 25% to 30%. There   is akinesis of the anteroseptal and apical myocardium. Features   are consistent with a pseudonormal left ventricular filling   pattern, with concomitant abnormal relaxation and increased   filling pressure (grade 2 diastolic dysfunction). - Aortic valve: There was trivial regurgitation. - Mitral valve: Calcified annulus. Mildly thickened leaflets .   There was mild regurgitation. - Left atrium: The atrium was moderately dilated.    ASSESSMENT AND PLAN:  1.  Chronic systolic heart failure due to ischemic cardiomyopathy: Status post Saint Jude dual-chamber Wright implanted 06/16/2016.  No ventricular arrhythmias noted.  Currently on optimal medical therapy.  No obvious  volume overload.  Device functioning appropriately.  No changes at this time.  2.  Coronary artery disease status post CABG: No current chest pain.  Continue with current management.  She does get chest pain with exertion.  Despite that, she is able to walk on a treadmill.  I Dallyn Bergland discuss this with her primary cardiologist.  3.  Hyperlipidemia: Goal LDL less than 70.  Continue statin per primary cardiology.  4.  Left bundle branch block: Has remained stable.  Has no symptoms.  She developed symptoms, would likely require device upgrade.  Current medicines are reviewed at length with the patient today.   The patient does not have concerns regarding her medicines.  The following changes were made today: none  Labs/ tests ordered today include:  Orders Placed This Encounter  Procedures   EKG 12-Lead     Disposition:   FU with Yasmin Bronaugh with 12 months  Signed, Desarai Barrack Meredith Leeds, MD  08/10/2021 4:24 PM     [facility] [address] [place] [address] [phone] (office) [phone] (fax)

## 2021-09-06 ENCOUNTER — Ambulatory Visit (INDEPENDENT_AMBULATORY_CARE_PROVIDER_SITE_OTHER): Payer: Medicare Other

## 2021-09-06 DIAGNOSIS — I255 Ischemic cardiomyopathy: Secondary | ICD-10-CM

## 2021-09-07 ENCOUNTER — Ambulatory Visit (INDEPENDENT_AMBULATORY_CARE_PROVIDER_SITE_OTHER): Payer: Medicare Other

## 2021-09-07 DIAGNOSIS — Z95810 Presence of automatic (implantable) cardiac defibrillator: Secondary | ICD-10-CM | POA: Diagnosis not present

## 2021-09-07 DIAGNOSIS — I5022 Chronic systolic (congestive) heart failure: Secondary | ICD-10-CM

## 2021-09-07 LAB — CUP PACEART REMOTE DEVICE CHECK
Battery Remaining Longevity: 46 mo
Battery Remaining Percentage: 47 %
Battery Voltage: 2.92 V
Brady Statistic AP VP Percent: 1 %
Brady Statistic AP VS Percent: 1.2 %
Brady Statistic AS VP Percent: 1 %
Brady Statistic AS VS Percent: 98 %
Brady Statistic RA Percent Paced: 1 %
Brady Statistic RV Percent Paced: 1 %
Date Time Interrogation Session: 20221017020019
HighPow Impedance: 79 Ohm
HighPow Impedance: 79 Ohm
Implantable Lead Implant Date: 20170727
Implantable Lead Implant Date: 20170727
Implantable Lead Location: 753859
Implantable Lead Location: 753860
Implantable Pulse Generator Implant Date: 20170727
Lead Channel Impedance Value: 380 Ohm
Lead Channel Impedance Value: 400 Ohm
Lead Channel Pacing Threshold Amplitude: 0.5 V
Lead Channel Pacing Threshold Amplitude: 0.75 V
Lead Channel Pacing Threshold Pulse Width: 0.5 ms
Lead Channel Pacing Threshold Pulse Width: 0.5 ms
Lead Channel Sensing Intrinsic Amplitude: 12 mV
Lead Channel Sensing Intrinsic Amplitude: 4 mV
Lead Channel Setting Pacing Amplitude: 2 V
Lead Channel Setting Pacing Amplitude: 2.5 V
Lead Channel Setting Pacing Pulse Width: 0.5 ms
Lead Channel Setting Sensing Sensitivity: 0.5 mV
Pulse Gen Serial Number: 7261095

## 2021-09-10 NOTE — Progress Notes (Signed)
EPIC Encounter for ICM Monitoring  Patient Name: Hailey Wright is a 75 y.o. female Date: 09/10/2021 Primary Care Physican: Leanna Battles, MD Primary Cardiologist: Hochrein Electrophysiologist: Curt Bears 09/10/2021 Weight: 148 lbs   Spoke with patient and heart failure questions reviewed.  Pt asymptomatic for fluid accumulation and feeling well.   Corvue thoracic impedance normal fluid levels but was suggesting possible fluid accumulation from 9/20-9/30 and 10/11-10/14.   Prescribed: Furosemide 40 mg 1 tablet daily as needed.  She takes 0.5 tablet as needed since 40 mg tends to make her legs cramp.    Labs: 12/10/2020 Creatinine 1.1, BUN 17, Potassium 5.0, Sodium 139, GFR 59.1 A complete set of results can be found in Results Review.   Recommendations:  No changes and encouraged to call if experiencing any fluid symptoms.   Follow-up plan: ICM clinic phone appointment on 10/18/2021.   91 day device clinic remote transmission 12/06/2021.     EP/Cardiology Office Visit:  09/22/2022 with Dr. Percival Spanish.     Copy of ICM check sent to Dr. Curt Bears.    3 month ICM trend: 09/07/2021.    1 Year ICM trend:       [name], RN 09/10/2021 3:04 PM

## 2021-09-13 DIAGNOSIS — R5383 Other fatigue: Secondary | ICD-10-CM | POA: Diagnosis not present

## 2021-09-13 DIAGNOSIS — J384 Edema of larynx: Secondary | ICD-10-CM | POA: Diagnosis not present

## 2021-09-13 DIAGNOSIS — R490 Dysphonia: Secondary | ICD-10-CM | POA: Diagnosis not present

## 2021-09-13 DIAGNOSIS — Z87891 Personal history of nicotine dependence: Secondary | ICD-10-CM | POA: Diagnosis not present

## 2021-09-13 DIAGNOSIS — R1310 Dysphagia, unspecified: Secondary | ICD-10-CM | POA: Diagnosis not present

## 2021-09-13 DIAGNOSIS — J383 Other diseases of vocal cords: Secondary | ICD-10-CM | POA: Diagnosis not present

## 2021-09-14 NOTE — Progress Notes (Signed)
Remote ICD transmission.   

## 2021-09-21 NOTE — Progress Notes (Signed)
Cardiology Office Note   Date:  09/22/2021   ID:  Hailey Wright, DOB Nov 13, 1946, MRN [id number]  PCP:  Leanna Battles, MD  Cardiologist:   Minus Breeding, MD   Chief Complaint  Patient presents with   Chest Pain      History of Present Illness: Hailey Wright is a 75 y.o. female who presents for follow of CAD and ischemic cardiomyopathy.  She has a history of known coronary disease as previously described. She had a negative stress perfusion study in the fall of 2016.  However, she came back with symptoms of acute onset chest pain. The plan was an elective cardiac catheterization. However, prior to that presented with an acute anterior myocardial infarction. She had occlusion of the LAD with acute thrombus in the circumflex. She did have chronic occlusion of her right coronary artery. She was placed on intra-aortic balloon pump and had emergent CABG with a LIMA to the LAD and SVG to OM1. She is left with an ejection fraction of approximately 30%. She had an ICD placed.   Her EF improved on echo in July 2018 to 35%. EF was 35 - 40% in July of last year.  I have been titrating meds.    Since I last saw her she started developing chest discomfort.  This has been since July but is getting progressive.  It happens with exertion and now is happening with mild exertion.  It is similar to her previous angina.  It is 7 and 10 out of intensity.  She gets some dyspnea with this.  It does go away when she stops what she is doing.  Its in the upper soft clavicle discomfort.  There is no radiation into her jaw or to her arms.  She is not having any nausea vomiting or diaphoresis.  It is not happening at rest.     Past Medical History:  Diagnosis Date   Acute ST elevation myocardial infarction (STEMI) involving left anterior descending coronary artery (HCC) 02/18/2016   AICD (automatic cardioverter/defibrillator) present    CAD (coronary artery disease) 06/04/2013   CHF (congestive heart failure) (HCC)     Clotting disorder (HCC)    COPD (chronic obstructive pulmonary disease) (HCC)    Coronary artery thrombosis ([place]) 02/18/2016   DVT (deep venous thrombosis) ([place]) 1980s   RLE   Hepatitis A infection 1987   Hyperlipidemia    Hypertension    Ischemic cardiomyopathy    a. Echo 5/17: EF 25-30%, anteroseptal, anterior, anteroseptal and apical akinesis, possible inferior hypokinesis, grade 1 diastolic dysfunction, no evidence of thrombus, trivial AI, mild MR, mild LAE   MI (myocardial infarction) ([place]) 1991   Inferior treated with TPA 1991, POBA RCA 1996   Pneumonia 1980s X 1   RAS (renal artery stenosis) ([place])    Right renal stent 2005   S/P Emergency CABG x 2 02/18/2016   LIMA to LAD, SVG to OM1, EVH via right thigh   Type II diabetes mellitus ([place])    pt denies    Past Surgical History:  Procedure Laterality Date   CARDIAC CATHETERIZATION N/A 02/18/2016   Procedure: Left Heart Cath and Coronary Angiography;  Surgeon: Leonie Man, MD;  Location: [facility];  Service: Cardiovascular;  Laterality: N/A;   CORONARY ANGIOPLASTY  1991   CORONARY ARTERY BYPASS GRAFT N/A 02/18/2016   Procedure: CORONARY ARTERY BYPASS GRAFTING (CABG) times 2 using left internal mammary and right greater saphenous vein;  Surgeon: Rexene Alberts,  MD;  Location: [facility];  Service: Open Heart Surgery;  Laterality: N/A;   EP IMPLANTABLE DEVICE N/A 06/16/2016   Procedure: ICD Implant;  Surgeon: Will Meredith Leeds, MD;  Location: [facility];  Service: Cardiovascular;  Laterality: N/A;   RENAL ARTERY STENT Right 2005   RHINOPLASTY Bilateral 1968   SHOULDER SURGERY Right 2019   Dr Onnie Graham   TONSILLECTOMY AND ADENOIDECTOMY  1950s     Current Outpatient Medications  Medication Sig Dispense Refill   albuterol (VENTOLIN HFA) 108 (90 Base) MCG/ACT inhaler Inhale into the lungs every 6 (six) hours as needed for wheezing or shortness of breath.     aspirin 81 MG tablet Take 81 mg by mouth daily.      calcium-vitamin D (OSCAL WITH D) 500-200 MG-UNIT per tablet Take 1 tablet by mouth 2 (two) times daily.     carboxymethylcellulose (REFRESH PLUS) 0.5 % SOLN Place 2 drops into both eyes daily as needed (for dry eyes).      clopidogrel (PLAVIX) 75 MG tablet Take 1 tablet (75 mg total) by mouth daily. 90 tablet 3   Co-Enzyme Q10 200 MG CAPS Take 200 mg by mouth daily.      ENTRESTO 24-26 MG TAKE 1 TABLET TWICE A DAY 180 tablet 3   ezetimibe (ZETIA) 10 MG tablet Take 10 mg by mouth daily.     furosemide (LASIX) 40 MG tablet TAKE 1 TABLET BY MOUTH AS NEEDED 30 tablet 3   isosorbide mononitrate (IMDUR) 30 MG 24 hr tablet TAKE 1 TABLET DAILY 90 tablet 3   Magnesium 500 MG TABS Take 500 mg by mouth daily.      metoprolol succinate (TOPROL-XL) 25 MG 24 hr tablet TAKE 1 TABLET DAILY (OVERDUE FOR OFFICE VISIT. CALL FOR APPOINTMENT/FUTURE REFILLS) 90 tablet 3   Multiple Vitamin (MULTIVITAMIN) tablet Take 1 tablet by mouth daily.     NITROSTAT 0.4 MG SL tablet Place 0.4 mg under the tongue every 5 (five) minutes as needed for chest pain.      rosuvastatin (CRESTOR) 40 MG tablet Take 1 tablet (40 mg total) by mouth daily. 90 tablet 1   umeclidinium-vilanterol (ANORO ELLIPTA) 62.5-25 MCG/INH AEPB Inhale 1 puff into the lungs daily.     Current Facility-Administered Medications  Medication Dose Route Frequency Provider Last Rate Last Admin   0.9 %  sodium chloride infusion  500 mL Intravenous Once Armbruster, Carlota Raspberry, MD        Allergies:   Patient has no known allergies.    ROS:  Please see the history of present illness.   Otherwise, review of systems are positive for none.   All other systems are reviewed and negative.    PHYSICAL EXAM: VS:  BP 108/65   Pulse (!) 59   Ht 5\' 6"  (1.676 m)   Wt 148 lb (67.1 kg)   LMP  (LMP Unknown)   SpO2 97%   BMI 23.89 kg/m  , BMI Body mass index is 23.89 kg/m.  GENERAL:  Well appearing NECK:  No jugular venous distention, waveform within normal limits,  carotid upstroke brisk and symmetric, no bruits, no thyromegaly LUNGS:  Clear to auscultation bilaterally CHEST:  Well healed sternotomy scar. HEART:  PMI not displaced or sustained,S1 and S2 within normal limits, no S3, no S4, no clicks, no rubs, no murmurs ABD:  Flat, positive bowel sounds normal in frequency in pitch, no bruits, no rebound, no guarding, no midline pulsatile mass, no hepatomegaly, no splenomegaly EXT:  2 plus pulses throughout, no edema, no cyanosis no clubbing    EKG:  EKG is  ordered today. Regular rhythm, unusual P wave axis, left bundle branch block.  No pacing spikes noted  Recent Labs: No results found for requested labs within last 8760 hours.    Lipid Panel No results found for: CHOL, TRIG, HDL, CHOLHDL, VLDL, LDLCALC, LDLDIRECT    Wt Readings from Last 3 Encounters:  09/22/21 148 lb (67.1 kg)  08/10/21 154 lb (69.9 kg)  04/26/21 164 lb 3.2 oz (74.5 kg)       Other studies Reviewed: Additional studies/ records that were reviewed today include:  None Review of the above records demonstrates:   See elsewhere  ASSESSMENT AND PLAN:   CAD -    The patient has new onset and progressive exertional angina which would be equivalent to unstable angina.   Cardiac cath is indicated. The patient understands that risks included but are not limited to stroke (1 in 1000), death (1 in 71), kidney failure [usually temporary] (1 in 500), bleeding (1 in 200), allergic reaction [possibly serious] (1 in 200).  The patient understands and agrees to proceed.  I will start Plavix.    Ischemic CM -       Her ejection fraction had improved to 35 to 40% on the last echo.  She has not tolerated med titration with lower blood pressures.  She will remain on the meds as listed.  HL -     LDL was 43 at the most recent.  No change in therapy.   ICD -     I reviewed the note from Dr. Curt Bears.  She has normal device function.  No change in therapy.     Current medicines are  reviewed at length with the patient today.  The patient does not have concerns regarding medicines.  The following changes have been made:   None  Labs/ tests ordered today include:  None    Orders Placed This Encounter  Procedures   CBC   Basic metabolic panel   EKG [id number]      Disposition:   FU with with me after the cath.    Signed, Minus Breeding, MD  09/22/2021 12:25 PM    [facility]

## 2021-09-21 NOTE — H&P (View-Only) (Signed)
Cardiology Office Note   Date:  09/22/2021   ID:  Hailey Wright, DOB Nov 23, 1945, MRN [id number]  PCP:  Hailey Battles, MD  Cardiologist:   Hailey Breeding, MD   Chief Complaint  Patient presents with   Chest Pain      History of Present Illness: Cozetta Seif is a 75 y.o. female who presents for follow of CAD and ischemic cardiomyopathy.  She has a history of known coronary disease as previously described. She had a negative stress perfusion study in the fall of 2016.  However, she came back with symptoms of acute onset chest pain. The plan was an elective cardiac catheterization. However, prior to that presented with an acute anterior myocardial infarction. She had occlusion of the LAD with acute thrombus in the circumflex. She did have chronic occlusion of her right coronary artery. She was placed on intra-aortic balloon pump and had emergent CABG with a LIMA to the LAD and SVG to OM1. She is left with an ejection fraction of approximately 30%. She had an ICD placed.   Her EF improved on echo in July 2018 to 35%. EF was 35 - 40% in July of last year.  I have been titrating meds.    Since I last saw her she started developing chest discomfort.  This has been since July but is getting progressive.  It happens with exertion and now is happening with mild exertion.  It is similar to her previous angina.  It is 7 and 10 out of intensity.  She gets some dyspnea with this.  It does go away when she stops what she is doing.  Its in the upper soft clavicle discomfort.  There is no radiation into her jaw or to her arms.  She is not having any nausea vomiting or diaphoresis.  It is not happening at rest.     Past Medical History:  Diagnosis Date   Acute ST elevation myocardial infarction (STEMI) involving left anterior descending coronary artery (HCC) 02/18/2016   AICD (automatic cardioverter/defibrillator) present    CAD (coronary artery disease) 06/04/2013   CHF (congestive heart failure) (HCC)     Clotting disorder (HCC)    COPD (chronic obstructive pulmonary disease) (HCC)    Coronary artery thrombosis ([place]) 02/18/2016   DVT (deep venous thrombosis) ([place]) 1980s   RLE   Hepatitis A infection 1987   Hyperlipidemia    Hypertension    Ischemic cardiomyopathy    a. Echo 5/17: EF 25-30%, anteroseptal, anterior, anteroseptal and apical akinesis, possible inferior hypokinesis, grade 1 diastolic dysfunction, no evidence of thrombus, trivial AI, mild MR, mild LAE   MI (myocardial infarction) ([place]) 1991   Inferior treated with TPA 1991, POBA RCA 1996   Pneumonia 1980s X 1   RAS (renal artery stenosis) ([place])    Right renal stent 2005   S/P Emergency CABG x 2 02/18/2016   LIMA to LAD, SVG to OM1, EVH via right thigh   Type II diabetes mellitus ([place])    pt denies    Past Surgical History:  Procedure Laterality Date   CARDIAC CATHETERIZATION N/A 02/18/2016   Procedure: Left Heart Cath and Coronary Angiography;  Surgeon: Leonie Man, MD;  Location: [facility];  Service: Cardiovascular;  Laterality: N/A;   CORONARY ANGIOPLASTY  1991   CORONARY ARTERY BYPASS GRAFT N/A 02/18/2016   Procedure: CORONARY ARTERY BYPASS GRAFTING (CABG) times 2 using left internal mammary and right greater saphenous vein;  Surgeon: Rexene Alberts,  MD;  Location: [facility];  Service: Open Heart Surgery;  Laterality: N/A;   EP IMPLANTABLE DEVICE N/A 06/16/2016   Procedure: ICD Implant;  Surgeon: Will Meredith Leeds, MD;  Location: [facility];  Service: Cardiovascular;  Laterality: N/A;   RENAL ARTERY STENT Right 2005   RHINOPLASTY Bilateral 1968   SHOULDER SURGERY Right 2019   Dr Onnie Graham   TONSILLECTOMY AND ADENOIDECTOMY  1950s     Current Outpatient Medications  Medication Sig Dispense Refill   albuterol (VENTOLIN HFA) 108 (90 Base) MCG/ACT inhaler Inhale into the lungs every 6 (six) hours as needed for wheezing or shortness of breath.     aspirin 81 MG tablet Take 81 mg by mouth daily.      calcium-vitamin D (OSCAL WITH D) 500-200 MG-UNIT per tablet Take 1 tablet by mouth 2 (two) times daily.     carboxymethylcellulose (REFRESH PLUS) 0.5 % SOLN Place 2 drops into both eyes daily as needed (for dry eyes).      clopidogrel (PLAVIX) 75 MG tablet Take 1 tablet (75 mg total) by mouth daily. 90 tablet 3   Co-Enzyme Q10 200 MG CAPS Take 200 mg by mouth daily.      ENTRESTO 24-26 MG TAKE 1 TABLET TWICE A DAY 180 tablet 3   ezetimibe (ZETIA) 10 MG tablet Take 10 mg by mouth daily.     furosemide (LASIX) 40 MG tablet TAKE 1 TABLET BY MOUTH AS NEEDED 30 tablet 3   isosorbide mononitrate (IMDUR) 30 MG 24 hr tablet TAKE 1 TABLET DAILY 90 tablet 3   Magnesium 500 MG TABS Take 500 mg by mouth daily.      metoprolol succinate (TOPROL-XL) 25 MG 24 hr tablet TAKE 1 TABLET DAILY (OVERDUE FOR OFFICE VISIT. CALL FOR APPOINTMENT/FUTURE REFILLS) 90 tablet 3   Multiple Vitamin (MULTIVITAMIN) tablet Take 1 tablet by mouth daily.     NITROSTAT 0.4 MG SL tablet Place 0.4 mg under the tongue every 5 (five) minutes as needed for chest pain.      rosuvastatin (CRESTOR) 40 MG tablet Take 1 tablet (40 mg total) by mouth daily. 90 tablet 1   umeclidinium-vilanterol (ANORO ELLIPTA) 62.5-25 MCG/INH AEPB Inhale 1 puff into the lungs daily.     Current Facility-Administered Medications  Medication Dose Route Frequency Provider Last Rate Last Admin   0.9 %  sodium chloride infusion  500 mL Intravenous Once Armbruster, Carlota Raspberry, MD        Allergies:   Patient has no known allergies.    ROS:  Please see the history of present illness.   Otherwise, review of systems are positive for none.   All other systems are reviewed and negative.    PHYSICAL EXAM: VS:  BP 108/65   Pulse (!) 59   Ht 5\' 6"  (1.676 m)   Wt 148 lb (67.1 kg)   LMP  (LMP Unknown)   SpO2 97%   BMI 23.89 kg/m  , BMI Body mass index is 23.89 kg/m.  GENERAL:  Well appearing NECK:  No jugular venous distention, waveform within normal limits,  carotid upstroke brisk and symmetric, no bruits, no thyromegaly LUNGS:  Clear to auscultation bilaterally CHEST:  Well healed sternotomy scar. HEART:  PMI not displaced or sustained,S1 and S2 within normal limits, no S3, no S4, no clicks, no rubs, no murmurs ABD:  Flat, positive bowel sounds normal in frequency in pitch, no bruits, no rebound, no guarding, no midline pulsatile mass, no hepatomegaly, no splenomegaly EXT:  2 plus pulses throughout, no edema, no cyanosis no clubbing    EKG:  EKG is  ordered today. Regular rhythm, unusual P wave axis, left bundle branch block.  No pacing spikes noted  Recent Labs: No results found for requested labs within last 8760 hours.    Lipid Panel No results found for: CHOL, TRIG, HDL, CHOLHDL, VLDL, LDLCALC, LDLDIRECT    Wt Readings from Last 3 Encounters:  09/22/21 148 lb (67.1 kg)  08/10/21 154 lb (69.9 kg)  04/26/21 164 lb 3.2 oz (74.5 kg)       Other studies Reviewed: Additional studies/ records that were reviewed today include:  None Review of the above records demonstrates:   See elsewhere  ASSESSMENT AND PLAN:   CAD -    The patient has new onset and progressive exertional angina which would be equivalent to unstable angina.   Cardiac cath is indicated. The patient understands that risks included but are not limited to stroke (1 in 1000), death (1 in 106), kidney failure [usually temporary] (1 in 500), bleeding (1 in 200), allergic reaction [possibly serious] (1 in 200).  The patient understands and agrees to proceed.  I will start Plavix.    Ischemic CM -       Her ejection fraction had improved to 35 to 40% on the last echo.  She has not tolerated med titration with lower blood pressures.  She will remain on the meds as listed.  HL -     LDL was 43 at the most recent.  No change in therapy.   ICD -     I reviewed the note from Dr. Curt Bears.  She has normal device function.  No change in therapy.     Current medicines are  reviewed at length with the patient today.  The patient does not have concerns regarding medicines.  The following changes have been made:   None  Labs/ tests ordered today include:  None    Orders Placed This Encounter  Procedures   CBC   Basic metabolic panel   EKG 21-HYQM      Disposition:   FU with with me after the cath.    Signed, Hailey Breeding, MD  09/22/2021 12:25 PM     [facility]

## 2021-09-22 ENCOUNTER — Ambulatory Visit (INDEPENDENT_AMBULATORY_CARE_PROVIDER_SITE_OTHER): Payer: Medicare Other | Admitting: Cardiology

## 2021-09-22 ENCOUNTER — Encounter: Payer: Self-pay | Admitting: Cardiology

## 2021-09-22 ENCOUNTER — Other Ambulatory Visit: Payer: Self-pay

## 2021-09-22 VITALS — BP 108/65 | HR 59 | Ht 66.0 in | Wt 148.0 lb

## 2021-09-22 DIAGNOSIS — I255 Ischemic cardiomyopathy: Secondary | ICD-10-CM

## 2021-09-22 DIAGNOSIS — E785 Hyperlipidemia, unspecified: Secondary | ICD-10-CM | POA: Diagnosis not present

## 2021-09-22 DIAGNOSIS — I2510 Atherosclerotic heart disease of native coronary artery without angina pectoris: Secondary | ICD-10-CM

## 2021-09-22 MED ORDER — CLOPIDOGREL BISULFATE 75 MG PO TABS: 75.0000 mg | ORAL_TABLET | Freq: Every day | ORAL | 3 refills | Status: DC

## 2021-09-22 NOTE — Patient Instructions (Signed)
Medication Instructions:  Start Plavix 75 mg daily  *If you need a refill on your cardiac medications before your next appointment, please call your pharmacy*   Lab Work: CBC, BMET today   If you have labs (blood work) drawn today and your tests are completely normal, you will receive your results only by: [place] (if you have MyChart) OR A paper copy in the mail If you have any lab test that is abnormal or we need to change your treatment, we will call you to review the results.   Testing/Procedures: Your physician has requested that you have a cardiac catheterization. Cardiac catheterization is used to diagnose and/or treat various heart conditions. Doctors may recommend this procedure for a number of different reasons. The most common reason is to evaluate chest pain. Chest pain can be a symptom of coronary artery disease (CAD), and cardiac catheterization can show whether plaque is narrowing or blocking your heart's arteries. This procedure is also used to evaluate the valves, as well as measure the blood flow and oxygen levels in different parts of your heart. For further information please visit [email or web address]. Please follow instruction sheet, as given.    Follow-Up: At [facility], you and your health needs are our priority.  As part of our continuing mission to provide you with exceptional heart care, we have created designated Provider Care Teams.  These Care Teams include your primary Cardiologist (physician) and Advanced Practice Providers (APPs -  Physician Assistants and Nurse Practitioners) who all work together to provide you with the care you need, when you need it.  We recommend signing up for the patient portal called "MyChart".  Sign up information is provided on this After Visit Summary.  MyChart is used to connect with patients for Virtual Visits (Telemedicine).  Patients are able to view lab/test results, encounter notes, upcoming appointments, etc.   Non-urgent messages can be sent to your provider as well.   To learn more about what you can do with MyChart, go to [email or web address].    Your next appointment:   2 week(s) post CATH  The format for your next appointment:   In Person  Provider:   You will see one of the following Advanced Practice Providers on your designated Care Team:   Rosaria Ferries, PA-C Caron Presume, PA-C Jory Sims, DNP, ANP    Other Instructions  [place] [address] [place] [address] Dept: [phone] Loc: [phone]  Lindi Abram  09/22/2021  You are scheduled for a Cardiac Catheterization on Thursday, November 10 with Dr. Shelva Majestic.  1. Please arrive at the [facility] (Main Entrance A) at [facility]: [address] [place], [place] [address] at 5:30 AM (This time is two hours before your procedure to ensure your preparation). Free valet parking service is available.   Special note: Every effort is made to have your procedure done on time. Please understand that emergencies sometimes delay scheduled procedures.  2. Diet: Do not eat solid foods after midnight.  The patient may have clear liquids until 5am upon the day of the procedure.  3. Labs: You will need to have blood drawn today- CBC, BMET. You do not need to be fasting.  4. Medication instructions in preparation for your procedure:   Contrast Allergy: No  On the morning of your procedure, take your Plavix/Clopidogrel and any morning medicines NOT listed above.  You may use sips of water.  5.  Plan for one night stay--bring personal belongings. 6. Bring a current list of your medications and current insurance cards. 7. You MUST have a responsible person to drive you home. 8. Someone MUST be with you the first 24 hours after you arrive home or your discharge will be delayed. 9. Please wear clothes that are easy to  get on and off and wear slip-on shoes.  Thank you for allowing [place] to care for you!   -- [facility]

## 2021-09-23 LAB — CBC
Hematocrit: 40.8 % (ref 34.0–46.6)
Hemoglobin: 13.5 g/dL (ref 11.1–15.9)
MCH: 29.9 pg (ref 26.6–33.0)
MCHC: 33.1 g/dL (ref 31.5–35.7)
MCV: 91 fL (ref 79–97)
Platelets: 182 10*3/uL (ref 150–450)
RBC: 4.51 x10E6/uL (ref 3.77–5.28)
RDW: 13.3 % (ref 11.7–15.4)
WBC: 5.9 10*3/uL (ref 3.4–10.8)

## 2021-09-23 LAB — BASIC METABOLIC PANEL
BUN/Creatinine Ratio: 13 (ref 12–28)
BUN: 12 mg/dL (ref 8–27)
CO2: 22 mmol/L (ref 20–29)
Calcium: 9.8 mg/dL (ref 8.7–10.3)
Chloride: 103 mmol/L (ref 96–106)
Creatinine, Ser: 0.96 mg/dL (ref 0.57–1.00)
Glucose: 86 mg/dL (ref 70–99)
Potassium: 4.1 mmol/L (ref 3.5–5.2)
Sodium: 140 mmol/L (ref 134–144)
eGFR: 62 mL/min/{1.73_m2} (ref >59)

## 2021-09-28 ENCOUNTER — Telehealth: Payer: Self-pay | Admitting: *Deleted

## 2021-09-28 NOTE — Telephone Encounter (Signed)
Cardiac catheterization scheduled at [facility] for: Thursday September 30, 2021 7:30 [facility] Main Entrance A [facility]) at: 5:30 AM   No solid food after midnight prior to cath, clear liquids until 5 AM day of procedure.  Medication instructions: Hold: Lasix-AM of procedure  Except hold medications usual morning medications can be taken pre-cath with sips of water including: -aspirin 81 mg -Plavix 75 mg    Confirmed patient has responsible adult to drive home post procedure and be with patient first 24 hours after arriving home.  [facility] does allow one visitor to accompany you and wait in the hospital waiting room while you are there for your procedure. You and your visitor will be asked to wear a mask once you enter the hospital.   Patient reports does not currently have any new symptoms concerning for COVID-19 and no household members with COVID-19 like illness.     Reviewed procedure/mask/visitor instructions with patient.

## 2021-09-29 ENCOUNTER — Encounter: Payer: Self-pay | Admitting: *Deleted

## 2021-09-30 ENCOUNTER — Other Ambulatory Visit: Payer: Self-pay

## 2021-09-30 ENCOUNTER — Ambulatory Visit (HOSPITAL_COMMUNITY)
Admission: RE | Disposition: A | Discharge status: Home / Self Care | Payer: Self-pay | Source: Ambulatory Visit | Attending: Cardiovascular Disease

## 2021-09-30 ENCOUNTER — Ambulatory Visit (HOSPITAL_COMMUNITY)
Admission: RE | Admit: 2021-09-30 | Discharge: 2021-09-30 | Disposition: A | Discharge status: Home / Self Care | Payer: Medicare Other | Source: Ambulatory Visit | Attending: Cardiovascular Disease | Admitting: Cardiovascular Disease

## 2021-09-30 ENCOUNTER — Encounter (HOSPITAL_COMMUNITY): Payer: Self-pay | Admitting: Cardiovascular Disease

## 2021-09-30 DIAGNOSIS — I25118 Atherosclerotic heart disease of native coronary artery with other forms of angina pectoris: Secondary | ICD-10-CM | POA: Diagnosis not present

## 2021-09-30 DIAGNOSIS — Z95810 Presence of automatic (implantable) cardiac defibrillator: Secondary | ICD-10-CM | POA: Insufficient documentation

## 2021-09-30 DIAGNOSIS — Z951 Presence of aortocoronary bypass graft: Secondary | ICD-10-CM | POA: Insufficient documentation

## 2021-09-30 DIAGNOSIS — I2582 Chronic total occlusion of coronary artery: Secondary | ICD-10-CM | POA: Insufficient documentation

## 2021-09-30 DIAGNOSIS — E785 Hyperlipidemia, unspecified: Secondary | ICD-10-CM | POA: Insufficient documentation

## 2021-09-30 DIAGNOSIS — I255 Ischemic cardiomyopathy: Secondary | ICD-10-CM | POA: Diagnosis not present

## 2021-09-30 DIAGNOSIS — I252 Old myocardial infarction: Secondary | ICD-10-CM | POA: Insufficient documentation

## 2021-09-30 DIAGNOSIS — I25110 Atherosclerotic heart disease of native coronary artery with unstable angina pectoris: Secondary | ICD-10-CM | POA: Diagnosis not present

## 2021-09-30 HISTORY — PX: LEFT HEART CATH AND CORS/GRAFTS ANGIOGRAPHY: CATH118250

## 2021-09-30 SURGERY — LEFT HEART CATH AND CORS/GRAFTS ANGIOGRAPHY
Anesthesia: LOCAL

## 2021-09-30 MED ORDER — LIDOCAINE HCL (PF) 1 % IJ SOLN
INTRAMUSCULAR | Status: DC | PRN
  Administered 2021-09-30: 15 mL

## 2021-09-30 MED ORDER — ASPIRIN 81 MG PO CHEW: 81.0000 mg | CHEWABLE_TABLET | Freq: Every day | ORAL | Status: DC

## 2021-09-30 MED ORDER — VERAPAMIL HCL 2.5 MG/ML IV SOLN
INTRAVENOUS | Status: AC
  Filled 2021-09-30: qty 2

## 2021-09-30 MED ORDER — SODIUM CHLORIDE 0.9% FLUSH: 3.0000 mL | INTRAVENOUS | Status: DC | PRN

## 2021-09-30 MED ORDER — FENTANYL CITRATE (PF) 100 MCG/2ML IJ SOLN
INTRAMUSCULAR | Status: AC
  Filled 2021-09-30: qty 2

## 2021-09-30 MED ORDER — LABETALOL HCL 5 MG/ML IV SOLN: 10.0000 mg | INTRAVENOUS | Status: DC | PRN

## 2021-09-30 MED ORDER — ACETAMINOPHEN 325 MG PO TABS: 650.0000 mg | ORAL_TABLET | ORAL | Status: DC | PRN

## 2021-09-30 MED ORDER — SODIUM CHLORIDE 0.9% FLUSH: 3.0000 mL | Freq: Two times a day (BID) | INTRAVENOUS | Status: DC

## 2021-09-30 MED ORDER — SODIUM CHLORIDE 0.9 % IV SOLN: 250.0000 mL | INTRAVENOUS | Status: DC | PRN

## 2021-09-30 MED ORDER — MIDAZOLAM HCL 2 MG/2ML IJ SOLN
INTRAMUSCULAR | Status: DC | PRN
  Administered 2021-09-30 (×2): 1 mg via INTRAVENOUS

## 2021-09-30 MED ORDER — SODIUM CHLORIDE 0.9 % WEIGHT BASED INFUSION
3.0000 mL/kg/h | INTRAVENOUS | Status: DC
  Administered 2021-09-30: 3 mL/kg/h via INTRAVENOUS

## 2021-09-30 MED ORDER — ONDANSETRON HCL 4 MG/2ML IJ SOLN: 4.0000 mg | Freq: Four times a day (QID) | INTRAMUSCULAR | Status: DC | PRN

## 2021-09-30 MED ORDER — ASPIRIN 81 MG PO CHEW: 81.0000 mg | CHEWABLE_TABLET | ORAL | Status: DC

## 2021-09-30 MED ORDER — FENTANYL CITRATE (PF) 100 MCG/2ML IJ SOLN
INTRAMUSCULAR | Status: DC | PRN
  Administered 2021-09-30 (×2): 25 ug via INTRAVENOUS

## 2021-09-30 MED ORDER — MIDAZOLAM HCL 2 MG/2ML IJ SOLN
INTRAMUSCULAR | Status: AC
  Filled 2021-09-30: qty 2

## 2021-09-30 MED ORDER — IOHEXOL 350 MG/ML SOLN
INTRAVENOUS | Status: DC | PRN
  Administered 2021-09-30: 70 mL

## 2021-09-30 MED ORDER — HEPARIN (PORCINE) IN NACL 1000-0.9 UT/500ML-% IV SOLN
INTRAVENOUS | Status: AC
  Filled 2021-09-30: qty 1000

## 2021-09-30 MED ORDER — HEPARIN SODIUM (PORCINE) 1000 UNIT/ML IJ SOLN
INTRAMUSCULAR | Status: AC
  Filled 2021-09-30: qty 1

## 2021-09-30 MED ORDER — DIAZEPAM 5 MG PO TABS: 5.0000 mg | ORAL_TABLET | Freq: Four times a day (QID) | ORAL | Status: DC | PRN

## 2021-09-30 MED ORDER — HYDRALAZINE HCL 20 MG/ML IJ SOLN: 10.0000 mg | INTRAMUSCULAR | Status: DC | PRN

## 2021-09-30 MED ORDER — HEPARIN (PORCINE) IN NACL 1000-0.9 UT/500ML-% IV SOLN
INTRAVENOUS | Status: DC | PRN
  Administered 2021-09-30 (×2): 500 mL

## 2021-09-30 MED ORDER — SODIUM CHLORIDE 0.9 % IV SOLN: INTRAVENOUS | Status: DC

## 2021-09-30 MED ORDER — LIDOCAINE HCL (PF) 1 % IJ SOLN
INTRAMUSCULAR | Status: AC
  Filled 2021-09-30: qty 30

## 2021-09-30 MED ORDER — CLOPIDOGREL BISULFATE 75 MG PO TABS: 75.0000 mg | ORAL_TABLET | ORAL | Status: DC

## 2021-09-30 MED ORDER — SODIUM CHLORIDE 0.9 % WEIGHT BASED INFUSION: 1.0000 mL/kg/h | INTRAVENOUS | Status: DC

## 2021-09-30 MED ORDER — ISOSORBIDE MONONITRATE ER 60 MG PO TB24: 60.0000 mg | ORAL_TABLET | Freq: Every day | ORAL | 1 refills | Status: DC

## 2021-09-30 SURGICAL SUPPLY — 10 items
CATH INFINITI 5FR MULTPACK ANG (CATHETERS) ×2 IMPLANT
CLOSURE MYNX CONTROL 5F (Vascular Products) IMPLANT
KIT HEART LEFT (KITS) ×2 IMPLANT
PACK CARDIAC CATHETERIZATION (CUSTOM PROCEDURE TRAY) ×2 IMPLANT
SHEATH PINNACLE 5F 10CM (SHEATH) ×2 IMPLANT
SHEATH PINNACLE 6F 10CM (SHEATH) ×2 IMPLANT
SHEATH PROBE COVER 6X72 (BAG) ×2 IMPLANT
TRANSDUCER W/STOPCOCK (MISCELLANEOUS) ×2 IMPLANT
TUBING CIL FLEX 10 FLL-RA (TUBING) ×2 IMPLANT
WIRE EMERALD 3MM-J .035X150CM (WIRE) ×2 IMPLANT

## 2021-09-30 NOTE — Interval H&P Note (Signed)
Cath Lab Visit (complete for each Cath Lab visit)  Clinical Evaluation Leading to the Procedure:   ACS: No.  Non-ACS:    Anginal Classification: CCS III  Anti-ischemic medical therapy: Maximal Therapy (2 or more classes of medications)  Non-Invasive Test Results: No non-invasive testing performed  Prior CABG: Previous CABG      History and Physical Interval Note:  09/30/2021 7:44 AM  Hailey Wright  has presented today for surgery, with the diagnosis of unstable angina.  The various methods of treatment have been discussed with the patient and family. After consideration of risks, benefits and other options for treatment, the patient has consented to  Procedure(s): LEFT HEART CATH AND CORS/GRAFTS ANGIOGRAPHY (N/A) as a surgical intervention.  The patient's history has been reviewed, patient examined, no change in status, stable for surgery.  I have reviewed the patient's chart and labs.  Questions were answered to the patient's satisfaction.     Shelva Majestic

## 2021-09-30 NOTE — Progress Notes (Signed)
Spoke with Ria Comment, NP about  continuing Plavix, states she will ask the Dr and call me back.

## 2021-09-30 NOTE — Progress Notes (Signed)
Discharge instructions reviewed with pt and her husband both voice understanding.  

## 2021-09-30 NOTE — Discharge Instructions (Signed)
Femoral Site Care This sheet gives you information about how to care for yourself after your procedure. Your health care provider may also give you more specific instructions. If you have problems or questions, contact your health care provider. What can I expect after the procedure? After the procedure, it is common to have: Bruising that usually fades within 1-2 weeks. Tenderness at the site. Follow these instructions at home: Wound care May remove bandage after 24 hours. Do not take baths, swim, or use a hot tub for 5 days. You may shower 24-48 hours after the procedure. Gently wash the site with plain soap and water. Pat the area dry with a clean towel. Do not rub the site. This may cause bleeding. Do not apply powder or lotion to the site. Keep the site clean and dry. Check your femoral site every day for signs of infection. Check for: Redness, swelling, or pain. Fluid or blood. Warmth. Pus or a bad smell. Activity For the first 2-3 days after your procedure, or as long as directed: Avoid climbing stairs as much as possible. Do not squat. Do not lift, push or pull anything that is heavier than 10 lb for 5 days. Rest as directed. Avoid sitting for a long time without moving. Get up to take short walks every 1-2 hours. Do not drive for 24 hours. General instructions Take over-the-counter and prescription medicines only as told by your health care provider. Keep all follow-up visits as told by your health care provider. This is important. DRINK PLENTY OF FLUIDS FOR THE NEXT 2-3 DAYS. Contact a health care provider if you have: A fever or chills. You have redness, swelling, or pain around your insertion site. Get help right away if: The catheter insertion area swells very fast. You pass out. You suddenly start to sweat or your skin gets clammy. The catheter insertion area is bleeding, and the bleeding does not stop when you hold steady pressure on the area. The area near or  just beyond the catheter insertion site becomes pale, cool, tingly, or numb. These symptoms may represent a serious problem that is an emergency. Do not wait to see if the symptoms will go away. Get medical help right away. Call your local emergency services (911 in the [place] Do not drive yourself to the hospital. Summary After the procedure, it is common to have bruising that usually fades within 1-2 weeks. Check your femoral site every day for signs of infection. Do not lift, push or pull anything that is heavier than 10 lb for 5 days.  This information is not intended to replace advice given to you by your health care provider. Make sure you discuss any questions you have with your health care provider. Document Revised: 11/20/2017 Document Reviewed: 11/20/2017 Elsevier Patient Education  2020 [employer]  

## 2021-09-30 NOTE — Progress Notes (Signed)
Ambulated in the hallway tol well no bleeding noted before or after ambulation. Ambulated to the bathroom to void. Tol well

## 2021-09-30 NOTE — Progress Notes (Signed)
Ria Comment, NP called back and states she will adjust the med rec. Reviewed with pt and her husband. Both voice understanding

## 2021-10-01 MED FILL — Verapamil HCl IV Soln 2.5 MG/ML: INTRAVENOUS | Qty: 2 | Status: AC

## 2021-10-06 MED ORDER — ISOSORBIDE MONONITRATE ER 120 MG PO TB24: 120.0000 mg | ORAL_TABLET | Freq: Every day | ORAL | 1 refills | Status: AC

## 2021-10-06 NOTE — Telephone Encounter (Signed)
RX sent to pharmacy. Thanks

## 2021-10-07 DIAGNOSIS — R490 Dysphonia: Secondary | ICD-10-CM | POA: Diagnosis not present

## 2021-10-07 DIAGNOSIS — J383 Other diseases of vocal cords: Secondary | ICD-10-CM | POA: Diagnosis not present

## 2021-10-07 DIAGNOSIS — Z1231 Encounter for screening mammogram for malignant neoplasm of breast: Secondary | ICD-10-CM | POA: Diagnosis not present

## 2021-10-17 ENCOUNTER — Encounter: Payer: Self-pay | Admitting: Cardiology

## 2021-10-18 ENCOUNTER — Ambulatory Visit (INDEPENDENT_AMBULATORY_CARE_PROVIDER_SITE_OTHER): Payer: Medicare Other

## 2021-10-18 DIAGNOSIS — I5022 Chronic systolic (congestive) heart failure: Secondary | ICD-10-CM

## 2021-10-18 DIAGNOSIS — Z95810 Presence of automatic (implantable) cardiac defibrillator: Secondary | ICD-10-CM

## 2021-10-18 NOTE — Progress Notes (Signed)
Cardiology Office Note:    Date:  10/28/2021   ID:  Hailey Wright, DOB 05-04-1946, MRN [id number]  PCP:  Leanna Battles, MD  Cardiologist:  Minus Breeding, MD  Electrophysiologist:  Constance Haw, MD   Referring MD: Leanna Battles, MD   Chief Complaint: follow-up of chest pain and cardiac catheterization  History of Present Illness:    Hailey Wright is a 76 y.o. female with a history of CAD with remote MI in 1991 s/p balloon angioplasty of RCA and then acute anterior STEMI in 01/2016 CABG x2 (LIMA to LAD and SVG to OM1), ischemic cardiomyopathy/ chronic combined CHF with EF of 35-40% on on last Echo in 06-26-2018, s/p ICD for primary prevention of sudden cardiac death, renal artery stenosis s/p stenting of right renal artery in 2005, hypertension, hyperlipidemia, type 2 diabetes mellitus (patient denies), and COPD who is followed by Dr. Percival Spanish and Dr. Curt Bears and presents today for follow up of chest pain and recent cardiac catheterization.  Patient has a long history of CAD. She presented with VF arrest in 1991 secondary to acute MI which was initially treated with tPA. Cardiac catheterization at that time showed 90% stenosis of mid RCA which was treated with balloon angioplasty. She did well until 08/2015 when she had recurrent chest pain. Myoview was initially ordered and was negative. However, she had recurrent chest pain. Outpatient cardiac catheterization was planned but before this could be done she presented with an acute anterior MI in 01/2016. Emergent cardiac catheterization showed severe multivessel CAD with thrombotic stenosis of the ostial LAD with proximal stenosis distal to the thrombus as well as 60% left main disease. She was also noted to have severe LV dysfunction. Intra-aortic balloon pump was placed and then patient was taken to the OR for emergent CABG with LIMA to LAD and SVG to OM1. Ech in 04/2016 showed LVEF remained down at 25-30%; therefore, she had ICD placed in Jun 26, 2016  for primary prevention of sudden cardiac death. Last Echo in Jun 26, 2018 showed LVEF of 35-40% with akinesis of the mid anteroseptal, basal inferior, and apical myocardium as well as hypokinesis of the basal anteroseptal myocardium. Also showed mild MR and mild to moderate TR.  Patient was last seen by Dr. Percival Spanish on 09/22/2021 at which time she reported progressive chest pain over the last several months similar to prior angina with some associated shortness of breath. She was started on Plavix and outpatient cardiac catheterization was arranged. LHC on 09/30/2021 showed severe native CAD with subtotal occlusion of the left main , total occlusion of the ostial LAD, and total occlusion of the LCX as well as CTO of the RCA with proximal occlusion and bridging collaterals. Prior grafts were widely patents. Therefore, continued medical management was recommended. She was continued on DAPT with Aspirin and Plavix and Imdur was increased.   Patient presents today for follow-up. Here alone. Patient has done well since cardiac catheterization and increase in Imdur. She denies any recurrent chest pain. She was able to go to the gymn the other day and walk on a steep incline on the treadmill without any problems. No significant shortness of breath, orthopnea, lower extremity edema, palpitations, lightheadedness/dizziness, syncope. Weights stable at home. She can tell when she starts to accumulate fluid because her rings start to fit tighter and she gets a little cough. She states she takes her PRN Lasix about once per week.  BP soft in the office today at 98/58 but asymptomatic with this. Tolerating DAPT well with  no abnormal bleeding.  Recent Labs from PCP's office on 10/11/2021: - Lipid panel: Total Cholesterol 138, Triglycerides 85, HDL 90, LDL 31.  - Hemoglobin A1c: 5.9. - CBC: WBC 5.66, Hgb 12.6, Plts 193.  - CMET: Na 139, K 3.9, Glucose 105, BUN 12, Cr 0.987, Albumin 3.7, AST 28, ALT 25, Alk Phos 56, Total Bili  0.5, Direct Bili 0.2.  - TSH: 1.58 - Magnesium: 2.4  Past Medical History:  Diagnosis Date   Acute ST elevation myocardial infarction (STEMI) involving left anterior descending coronary artery ([place]) 02/18/2016   AICD (automatic cardioverter/defibrillator) present    CAD (coronary artery disease) 06/04/2013   CHF (congestive heart failure) (HCC)    Clotting disorder (HCC)    COPD (chronic obstructive pulmonary disease) ([place])    Coronary artery thrombosis ([place]) 02/18/2016   DVT (deep venous thrombosis) ([place]) 1980s   RLE   Hepatitis A infection 1987   Hyperlipidemia    Hypertension    Ischemic cardiomyopathy    a. Echo 5/17: EF 25-30%, anteroseptal, anterior, anteroseptal and apical akinesis, possible inferior hypokinesis, grade 1 diastolic dysfunction, no evidence of thrombus, trivial AI, mild MR, mild LAE   MI (myocardial infarction) ([place]) 1991   Inferior treated with TPA 1991, POBA RCA 1996   Pneumonia 1980s X 1   RAS (renal artery stenosis) ([place])    Right renal stent 2005   S/P Emergency CABG x 2 02/18/2016   LIMA to LAD, SVG to OM1, EVH via right thigh   Type II diabetes mellitus ([place])    pt denies    Past Surgical History:  Procedure Laterality Date   CARDIAC CATHETERIZATION N/A 02/18/2016   Procedure: Left Heart Cath and Coronary Angiography;  Surgeon: Leonie Man, MD;  Location: [facility];  Service: Cardiovascular;  Laterality: N/A;   CORONARY ANGIOPLASTY  1991   CORONARY ARTERY BYPASS GRAFT N/A 02/18/2016   Procedure: CORONARY ARTERY BYPASS GRAFTING (CABG) times 2 using left internal mammary and right greater saphenous vein;  Surgeon: Rexene Alberts, MD;  Location: [facility];  Service: Open Heart Surgery;  Laterality: N/A;   EP IMPLANTABLE DEVICE N/A 06/16/2016   Procedure: ICD Implant;  Surgeon: Will Meredith Leeds, MD;  Location: [facility];  Service: Cardiovascular;  Laterality: N/A;   LEFT HEART CATH AND CORS/GRAFTS ANGIOGRAPHY N/A 09/30/2021   Procedure:  LEFT HEART CATH AND CORS/GRAFTS ANGIOGRAPHY;  Surgeon: Shelva Majestic A;  Location: [facility];  Service: Cardiovascular;  Laterality: N/A;   RENAL ARTERY STENT Right 2005   RHINOPLASTY Bilateral 1968   SHOULDER SURGERY Right 2019   Dr Onnie Graham   TONSILLECTOMY AND ADENOIDECTOMY  1950s    Current Medications: Current Meds  Medication Sig   albuterol (VENTOLIN HFA) 108 (90 Base) MCG/ACT inhaler Inhale 2 puffs into the lungs every 6 (six) hours as needed for wheezing or shortness of breath.   aspirin 81 MG tablet Take 81 mg by mouth daily.   calcium-vitamin D (OSCAL WITH D) 500-200 MG-UNIT per tablet Take 1 tablet by mouth 2 (two) times daily.   carboxymethylcellulose (REFRESH PLUS) 0.5 % SOLN Place 2 drops into both eyes daily as needed (for dry eyes).    clopidogrel (PLAVIX) 75 MG tablet Take 1 tablet (75 mg total) by mouth daily.   Co-Enzyme Q10 200 MG CAPS Take 200 mg by mouth at bedtime.   ENTRESTO 24-26 MG TAKE 1 TABLET TWICE A DAY   ezetimibe (ZETIA) 10 MG tablet Take 10 mg  by mouth at bedtime.   furosemide (LASIX) 40 MG tablet TAKE 1 TABLET BY MOUTH AS NEEDED (Patient taking differently: Take 20 mg by mouth daily as needed for fluid or edema.)   isosorbide mononitrate (IMDUR) 120 MG 24 hr tablet Take 1 tablet (120 mg total) by mouth daily.   Magnesium Oxide 420 MG TABS Take 420 mg by mouth daily.   metoprolol succinate (TOPROL-XL) 25 MG 24 hr tablet TAKE 1 TABLET DAILY (OVERDUE FOR OFFICE VISIT. CALL FOR APPOINTMENT/FUTURE REFILLS) (Patient taking differently: Take 25 mg by mouth daily as needed (SBP >100).)   Multiple Vitamin (MULTIVITAMIN) tablet Take 1 tablet by mouth daily.   NITROSTAT 0.4 MG SL tablet Place 0.4 mg under the tongue every 5 (five) minutes as needed for chest pain.    rosuvastatin (CRESTOR) 40 MG tablet Take 1 tablet (40 mg total) by mouth daily. (Patient taking differently: Take 40 mg by mouth at bedtime.)   umeclidinium-vilanterol (ANORO ELLIPTA) 62.5-25  MCG/INH AEPB Inhale 1 puff into the lungs at bedtime.     Allergies:   Patient has no known allergies.   Social History   Socioeconomic History   Marital status: Married    Spouse name: Tommy   Number of children: 1   Years of education: Not on file   Highest education level: Master's degree (e.g., MA, MS, MEng, MEd, MSW, MBA)  Occupational History    Comment: retired  Tobacco Use   Smoking status: Former    Packs/day: 1.00    Years: 40.00    Pack years: 40.00    Types: Cigarettes    Start date: 11/26/1966    Quit date: 06/05/2011    Years since quitting: 10.4   Smokeless tobacco: Never  Vaping Use   Vaping Use: Never used  Substance and Sexual Activity   Alcohol use: No   Drug use: No   Sexual activity: Not on file  Other Topics Concern   Not on file  Social History Narrative   Lives with husband.   Caffeine- 3-4 c daily   Social Determinants of Health   Financial Resource Strain: Not on file  Food Insecurity: Not on file  Transportation Needs: Not on file  Physical Activity: Not on file  Stress: Not on file  Social Connections: Not on file     Family History: The patient's family history includes Cancer in her mother; Heart attack (age of onset: 9) in her brother; Heart attack (age of onset: 28) in her father. There is no history of Colon cancer, Esophageal cancer, Rectal cancer, or Stomach cancer.  ROS:   Please see the history of present illness.     EKGs/Labs/Other Studies Reviewed:    The following studies were reviewed today:  Echocardiogram 06/06/2018: Study Conclusions: - Left ventricle: The cavity size was normal. Wall thickness was    increased in a pattern of mild LVH. Systolic function was    moderately reduced. The estimated ejection fraction was in the    range of 35% to 40%.  - Regional wall motion abnormality: Akinesis of the mid    anteroseptal, basal inferior, and apical myocardium; moderate    hypokinesis of the basal anteroseptal  myocardium.  - Mitral valve: Calcified annulus. There was mild regurgitation.  - Left atrium: The atrium was moderately to severely dilated.  - Right ventricle: Systolic function was normal.  - Atrial septum: No defect or patent foramen ovale was identified.  - Tricuspid valve: There was mild-moderate regurgitation.  -  Pulmonic valve: There was no significant regurgitation.  - Pulmonary arteries: PA peak pressure: 26 mm Hg (S).   Impressions: - Reduced LV EF with wall motion abnormalities as above. Similar to    prior study.  _______________  Left Cardiac Catheterization 09/30/2021:   Mid LM to Dist LM lesion is 99% stenosed.   Ost Cx to Prox Cx lesion is 100% stenosed.   Dist LM to Prox LAD lesion is 100% stenosed.   Prox RCA lesion is 100% stenosed.   Mid RCA lesion is 85% stenosed.   1st Mrg lesion is 20% stenosed.   Prox Cx lesion is 20% stenosed.   2nd Mrg lesion is 20% stenosed.   Dist LAD lesion is 20% stenosed.   Mid LAD lesion is 20% stenosed.   There is severe left ventricular systolic dysfunction.   LV end diastolic pressure is low.   The left ventricular ejection fraction is less than 25% by visual estimate.   Severe multivessel CAD with subtotal stenosis/occlusion of the left main with total ostial LAD occlusion and total circumflex occlusion with faint filling of a diminutive ramus vessel. Chronic total occlusion of the RCA with proximal occlusion and bridging collaterals supplying an ectatic mid segment with diffuse disease beyond the ectasia and antegrade filling of the PDA and PL vessel.   Widely patent LIMA supplying a large caliber LAD vessel with minimal luminal narrowing in the LAD.   Widely patent SVG supplying a large caliber OM1 vessel with retrograde filling of the AV groove and distal circumflex via this graft.   Severe global LV dysfunction with EF estimate approximately 20%; LVEDP 6 mmHg.   Recommendation: Patient was recently started on clopidogrel  by Dr. Percival Spanish; continue aspirin/Plavix.  In light of the patient's recent increased angina, recommend increasing anti-ischemic medications with possible increase of isosorbide, or potential initiation Ranexa or amlodipine.  Recommend follow-up 2D echo Doppler study and as blood pressure allows further titration of Entresto and additional GDMT for severe LV dysfunction.  Diagnostic Dominance: Right  EKG:  EKG not ordered today.   Recent Labs: 09/22/2021: BUN 12; Creatinine, Ser 0.96; Hemoglobin 13.5; Platelets 182; Potassium 4.1; Sodium 140  Recent Lipid Panel No results found for: CHOL, TRIG, HDL, CHOLHDL, VLDL, LDLCALC, LDLDIRECT  Physical Exam:    Vital Signs: BP (!) 98/58   Pulse 74   Ht 5' 6"  (1.676 m)   Wt 149 lb 6.4 oz (67.8 kg)   LMP  (LMP Unknown)   SpO2 98%   BMI 24.11 kg/m     Wt Readings from Last 3 Encounters:  10/28/21 149 lb 6.4 oz (67.8 kg)  09/30/21 146 lb (66.2 kg)  09/22/21 148 lb (67.1 kg)     General: 75 y.o. Caucasian female in no acute distress. HEENT: Normocephalic and atraumatic. Sclera clear. EOMs intact. Neck: Supple. No carotid bruits. No JVD. Heart: RRR. Distinct S1 and S2. Soft murmur. No gallops or rubs No Radial pulses 2+ and equal bilaterally. Right femoral cath site soft with no signs of hematoma. Lungs: No increased work of breathing. Clear to ausculation bilaterally. No wheezes, rhonchi, or rales.  Abdomen: Soft, non-distended, and non-tender to palpation.  Extremities: No lower extremity edema.    Skin: Warm and dry. Neuro: Alert and oriented x3. No focal deficits. Psych: Normal affect. Responds appropriately.  Assessment:    1. Coronary artery disease involving native coronary artery of native heart without angina pectoris   2. Ischemic cardiomyopathy   3. Chronic combined systolic  and diastolic CHF (congestive heart failure) ([place])   4. S/P ICD (internal cardiac defibrillator) procedure   5. Mitral valve insufficiency, unspecified  etiology   6. Tricuspid valve insufficiency, unspecified etiology   7. Renal artery stenosis ([place])   8. Primary hypertension   9. Hyperlipidemia, unspecified hyperlipidemia type   10. Type 2 diabetes mellitus with complication, without long-term current use of insulin (HCC)     Plan:    CAD  History of remote MI in 1991 which was initially treated with tPA and then balloon angioplasty of RCA and then more recently anterior STEMI in 01/2016 CABG x2 (LIMA to LAD and SVG to OM1). Patient was recently noted to have more progressive chest pain. LHC on 09/30/2021 showed severe native CAD with patent grafts. Continued medical therapy was recommended and Imdur was increased.  - No recurrent chest pain. - Continue Toprol-XL 72m daily and Imdur 1257mdaily. - Continue DAPT with Aspirin 81106maily and Plavix 38m40mily. - Continue high-intensity statin/Zetia.  Ischemic Cardiomyopathy Chronic Combined CHF LVEF as low as 25-30% after anterior MI in 2017. Last Echo in 2019 showed LVEF of 35-40% with akinesis of the mid anteroseptal, basal inferior, and apical myocardium as well as hypokinesis of the basal anteroseptal myocardium.  - Euvolemic on exam. - Continue Lasix 20mg29mly as needed for weight gain and edema (she states she needs this about once a week). - Continue Entresto 24-26mg 27me daily.  - Continue Toprol-XL 25mg d48m. - Continue Imdur 120mg da67m - Will repeat Echo as recommended by Dr. Harding Ellyn Hack of recent cath to reassess LVEF and see if GDMT needs to be optimized further. I don't think we will be able to up-titrate any of her current medications due to soft BP but could consider adding Jardiance.  S/p ICD Abbot ICD placed in 05/2016 for primary prevention of sudden cardiac death.  - Followed by Dr. Camnitz.Curt Bears Mitral Regurgitation Mild to Moderate Tricuspid Regurgitation Noted on last Echo in 2019.  - Can reassess on repeat Echo.  Renal Artery Stenosis  S/p stenting  to right renal artery in 2005.  - Does not look like patient has had any recent imaging so will order renal ultrasounds. Offered repeat renal ultrasound but patient would like to hold off at this time. I think this is reasonable given BP is well controlled and creatinine is normal.  Hypertension BP soft but stable. - Continue medications for CHF as above.  Hyperlipidemia Apolipoprotein B 54 in 11/2020 and LDL 31 in 09/2021 at PCP's office. - Continue Crestor 40mg dai14mnd Zetia 10mg dail17m Labs followed by PCP.  Type 2 Diabetes Mellitus  History of type 2 diabetes mellitus listed in chart but there is also a note beside this that says "patient denies." - Hemoglobin A1c 5.9 in 09/2021 which is consistent with prediabetes. - Followed by PCP.  Disposition: Follow up in 6 months.    Medication Adjustments/Labs and Tests Ordered: Current medicines are reviewed at length with the patient today.  Concerns regarding medicines are outlined above.  Orders Placed This Encounter  Procedures   ECHOCARDIOGRAM COMPLETE    No orders of the defined types were placed in this encounter.   Patient Instructions  Medication Instructions:  Your physician recommends that you continue on your current medications as directed. Please refer to the Current Medication list given to you today.  *If you need a refill on your cardiac medications before your next appointment, please call your  pharmacy*  Lab Work: NONE ordered at this time of appointment   If you have labs (blood work) drawn today and your tests are completely normal, you will receive your results only by: [place] (if you have MyChart) OR A paper copy in the mail If you have any lab test that is abnormal or we need to change your treatment, we will call you to review the results.  Testing/Procedures: Your physician has requested that you have an echocardiogram. Echocardiography is a painless test that uses sound waves to create  images of your heart. It provides your doctor with information about the size and shape of your heart and how well your heart's chambers and valves are working. This procedure takes approximately one hour. There are no restrictions for this procedure.  Please schedule for 1st available   Follow-Up: At [facility], you and your health needs are our priority.  As part of our continuing mission to provide you with exceptional heart care, we have created designated Provider Care Teams.  These Care Teams include your primary Cardiologist (physician) and Advanced Practice Providers (APPs -  Physician Assistants and Nurse Practitioners) who all work together to provide you with the care you need, when you need it.  We recommend signing up for the patient portal called "MyChart".  Sign up information is provided on this After Visit Summary.  MyChart is used to connect with patients for Virtual Visits (Telemedicine).  Patients are able to view lab/test results, encounter notes, upcoming appointments, etc.  Non-urgent messages can be sent to your provider as well.   To learn more about what you can do with MyChart, go to [email or web address].    Your next appointment:   3 month(s)  The format for your next appointment:   In Person  Provider:   Minus Breeding, MD    Other Instructions    Signed, Darreld Mclean, PA-C  10/28/2021 12:01 PM    [place]

## 2021-10-19 ENCOUNTER — Telehealth: Payer: Self-pay

## 2021-10-19 NOTE — Telephone Encounter (Signed)
Remote ICM transmission received.  Attempted call to patient regarding ICM remote transmission and left detailed message per DPR.  Advised to return call for any fluid symptoms or questions. Next ICM remote transmission scheduled 11/23/2021.

## 2021-10-19 NOTE — Progress Notes (Signed)
EPIC Encounter for ICM Monitoring  Patient Name: Hailey Wright is a 75 y.o. female Date: 10/19/2021 Primary Care Physican: Leanna Battles, MD Primary Cardiologist: Hochrein Electrophysiologist: Curt Bears 09/10/2021 Weight: 148 lbs   Attempted call to patient and unable to reach.  Left detailed message per DPR regarding transmission. Transmission reviewed.   Corvue thoracic impedance normal fluid levels but was suggesting possible fluid accumulation from 11/4-11/16.   Prescribed: Furosemide 40 mg 1 tablet daily as needed.  She takes 0.5 tablet as needed since 40 mg tends to make her legs cramp.    Labs: 12/10/2020 Creatinine 1.1, BUN 17, Potassium 5.0, Sodium 139, GFR 59.1 A complete set of results can be found in Results Review.   Recommendations:  Left voice mail with ICM number and encouraged to call if experiencing any fluid symptoms.   Follow-up plan: ICM clinic phone appointment on 11/23/2021.   91 day device clinic remote transmission 12/06/2021.     EP/Cardiology Office Visit:  09/22/2022 with Dr. Percival Spanish.     Copy of ICM check sent to Dr. Curt Bears.    3 month ICM trend: 10/18/2021.    12-14 Month ICM trend:       [name], RN 10/19/2021 1:26 PM

## 2021-10-28 ENCOUNTER — Other Ambulatory Visit: Payer: Self-pay

## 2021-10-28 ENCOUNTER — Ambulatory Visit (INDEPENDENT_AMBULATORY_CARE_PROVIDER_SITE_OTHER): Payer: Medicare Other | Admitting: Student

## 2021-10-28 ENCOUNTER — Encounter: Payer: Self-pay | Admitting: Student

## 2021-10-28 VITALS — BP 98/58 | HR 74 | Ht 66.0 in | Wt 149.4 lb

## 2021-10-28 DIAGNOSIS — E118 Type 2 diabetes mellitus with unspecified complications: Secondary | ICD-10-CM

## 2021-10-28 DIAGNOSIS — I071 Rheumatic tricuspid insufficiency: Secondary | ICD-10-CM | POA: Diagnosis not present

## 2021-10-28 DIAGNOSIS — E785 Hyperlipidemia, unspecified: Secondary | ICD-10-CM

## 2021-10-28 DIAGNOSIS — Z95810 Presence of automatic (implantable) cardiac defibrillator: Secondary | ICD-10-CM | POA: Diagnosis not present

## 2021-10-28 DIAGNOSIS — I5042 Chronic combined systolic (congestive) and diastolic (congestive) heart failure: Secondary | ICD-10-CM

## 2021-10-28 DIAGNOSIS — I2510 Atherosclerotic heart disease of native coronary artery without angina pectoris: Secondary | ICD-10-CM | POA: Diagnosis not present

## 2021-10-28 DIAGNOSIS — I701 Atherosclerosis of renal artery: Secondary | ICD-10-CM

## 2021-10-28 DIAGNOSIS — I10 Essential (primary) hypertension: Secondary | ICD-10-CM

## 2021-10-28 DIAGNOSIS — I340 Nonrheumatic mitral (valve) insufficiency: Secondary | ICD-10-CM | POA: Diagnosis not present

## 2021-10-28 DIAGNOSIS — I255 Ischemic cardiomyopathy: Secondary | ICD-10-CM | POA: Diagnosis not present

## 2021-10-28 NOTE — Patient Instructions (Signed)
Medication Instructions:  Your physician recommends that you continue on your current medications as directed. Please refer to the Current Medication list given to you today.  *If you need a refill on your cardiac medications before your next appointment, please call your pharmacy*  Lab Work: NONE ordered at this time of appointment   If you have labs (blood work) drawn today and your tests are completely normal, you will receive your results only by: [place] (if you have MyChart) OR A paper copy in the mail If you have any lab test that is abnormal or we need to change your treatment, we will call you to review the results.  Testing/Procedures: Your physician has requested that you have an echocardiogram. Echocardiography is a painless test that uses sound waves to create images of your heart. It provides your doctor with information about the size and shape of your heart and how well your heart's chambers and valves are working. This procedure takes approximately one hour. There are no restrictions for this procedure.  Please schedule for 1st available   Follow-Up: At [facility], you and your health needs are our priority.  As part of our continuing mission to provide you with exceptional heart care, we have created designated Provider Care Teams.  These Care Teams include your primary Cardiologist (physician) and Advanced Practice Providers (APPs -  Physician Assistants and Nurse Practitioners) who all work together to provide you with the care you need, when you need it.  We recommend signing up for the patient portal called "MyChart".  Sign up information is provided on this After Visit Summary.  MyChart is used to connect with patients for Virtual Visits (Telemedicine).  Patients are able to view lab/test results, encounter notes, upcoming appointments, etc.  Non-urgent messages can be sent to your provider as well.   To learn more about what you can do with MyChart, go to  [email or web address].    Your next appointment:   3 month(s)  The format for your next appointment:   In Person  Provider:   Minus Breeding, MD    Other Instructions

## 2021-11-04 DIAGNOSIS — R490 Dysphonia: Secondary | ICD-10-CM | POA: Diagnosis not present

## 2021-11-04 DIAGNOSIS — J383 Other diseases of vocal cords: Secondary | ICD-10-CM | POA: Diagnosis not present

## 2021-11-09 DIAGNOSIS — H00011 Hordeolum externum right upper eyelid: Secondary | ICD-10-CM | POA: Diagnosis not present

## 2021-11-18 ENCOUNTER — Other Ambulatory Visit: Payer: Self-pay

## 2021-11-18 ENCOUNTER — Ambulatory Visit (HOSPITAL_COMMUNITY): Payer: Medicare Other | Attending: Student

## 2021-11-18 DIAGNOSIS — I255 Ischemic cardiomyopathy: Secondary | ICD-10-CM | POA: Insufficient documentation

## 2021-11-18 LAB — ECHOCARDIOGRAM COMPLETE
Area-P 1/2: 3.77 cm2
Calc EF: 36.4 %
S' Lateral: 4.5 cm
Single Plane A2C EF: 35.9 %
Single Plane A4C EF: 32.8 %

## 2021-11-19 ENCOUNTER — Other Ambulatory Visit: Payer: Self-pay

## 2021-11-19 MED ORDER — EMPAGLIFLOZIN 10 MG PO TABS: 10.0000 mg | ORAL_TABLET | Freq: Every day | ORAL | 6 refills | Status: DC

## 2021-11-23 ENCOUNTER — Ambulatory Visit (INDEPENDENT_AMBULATORY_CARE_PROVIDER_SITE_OTHER): Payer: Medicare Other

## 2021-11-23 DIAGNOSIS — Z95810 Presence of automatic (implantable) cardiac defibrillator: Secondary | ICD-10-CM | POA: Diagnosis not present

## 2021-11-23 DIAGNOSIS — I5042 Chronic combined systolic (congestive) and diastolic (congestive) heart failure: Secondary | ICD-10-CM | POA: Diagnosis not present

## 2021-11-23 DIAGNOSIS — H00011 Hordeolum externum right upper eyelid: Secondary | ICD-10-CM | POA: Diagnosis not present

## 2021-11-26 NOTE — Progress Notes (Signed)
EPIC Encounter for ICM Monitoring  Patient Name: Hailey Wright is a 76 y.o. female Date: 11/26/2021 Primary Care Physican: Donnajean Lopes, MD Primary Cardiologist: Hochrein Electrophysiologist: Curt Bears 11/26/2021 Weight: 141 lbs   Spoke with patient and heart failure questions reviewed.  Pt asymptomatic for fluid accumulation.  Reports feeling reasonably well at this time and voices no complaints.  She is concerned about dehydration a side effect of Jaridance which she started within the last week.  Reviewed dehydration symptoms.  Advised to keep fluid level intake around 64 oz daily   Corvue thoracic impedance normal.   Prescribed: Furosemide 40 mg 1 tablet daily as needed.  She takes 0.5 tablet as needed since 40 mg tends to make her legs cramp.    Labs: 12/10/2020 Creatinine 1.1, BUN 17, Potassium 5.0, Sodium 139, GFR 59.1 A complete set of results can be found in Results Review.   Recommendations:  No changes and encouraged to call if experiencing any fluid symptoms.  Will recheck fluid levels for dehydration after starting Jardiance   Follow-up plan: ICM clinic phone appointment on 12/13/2021 to check fluid levels after starting Jardiance.   91 day device clinic remote transmission 12/06/2021.     EP/Cardiology Office Visit: 02/11/2022 with Dr. Percival Spanish.     Copy of ICM check sent to Dr. Curt Bears.    3 month ICM trend: 11/23/2021.    12-14 Month ICM trend:     [name], RN 11/26/2021 11:18 AM

## 2021-12-02 DIAGNOSIS — R490 Dysphonia: Secondary | ICD-10-CM | POA: Diagnosis not present

## 2021-12-02 DIAGNOSIS — J383 Other diseases of vocal cords: Secondary | ICD-10-CM | POA: Diagnosis not present

## 2021-12-06 ENCOUNTER — Ambulatory Visit (INDEPENDENT_AMBULATORY_CARE_PROVIDER_SITE_OTHER): Payer: Medicare Other

## 2021-12-06 DIAGNOSIS — I255 Ischemic cardiomyopathy: Secondary | ICD-10-CM | POA: Diagnosis not present

## 2021-12-06 LAB — CUP PACEART REMOTE DEVICE CHECK
Battery Remaining Longevity: 44 mo
Battery Remaining Percentage: 45 %
Battery Voltage: 2.9 V
Brady Statistic AP VP Percent: 1 %
Brady Statistic AP VS Percent: 1 %
Brady Statistic AS VP Percent: 1 %
Brady Statistic AS VS Percent: 99 %
Brady Statistic RA Percent Paced: 1 %
Brady Statistic RV Percent Paced: 1 %
Date Time Interrogation Session: 20230116020017
HighPow Impedance: 75 Ohm
HighPow Impedance: 75 Ohm
Implantable Lead Implant Date: 20170727
Implantable Lead Implant Date: 20170727
Implantable Lead Location: 753859
Implantable Lead Location: 753860
Implantable Pulse Generator Implant Date: 20170727
Lead Channel Impedance Value: 380 Ohm
Lead Channel Impedance Value: 410 Ohm
Lead Channel Pacing Threshold Amplitude: 0.5 V
Lead Channel Pacing Threshold Amplitude: 0.75 V
Lead Channel Pacing Threshold Pulse Width: 0.5 ms
Lead Channel Pacing Threshold Pulse Width: 0.5 ms
Lead Channel Sensing Intrinsic Amplitude: 12 mV
Lead Channel Sensing Intrinsic Amplitude: 3.5 mV
Lead Channel Setting Pacing Amplitude: 2 V
Lead Channel Setting Pacing Amplitude: 2.5 V
Lead Channel Setting Pacing Pulse Width: 0.5 ms
Lead Channel Setting Sensing Sensitivity: 0.5 mV
Pulse Gen Serial Number: 7261095

## 2021-12-13 ENCOUNTER — Ambulatory Visit (INDEPENDENT_AMBULATORY_CARE_PROVIDER_SITE_OTHER): Payer: Medicare Other

## 2021-12-13 ENCOUNTER — Ambulatory Visit
Admission: RE | Admit: 2021-12-13 | Discharge: 2021-12-13 | Disposition: A | Discharge status: Home / Self Care | Payer: Medicare Other | Source: Ambulatory Visit | Attending: Acute Care | Admitting: Acute Care

## 2021-12-13 DIAGNOSIS — I5042 Chronic combined systolic (congestive) and diastolic (congestive) heart failure: Secondary | ICD-10-CM

## 2021-12-13 DIAGNOSIS — I700 Atherosclerosis of aorta: Secondary | ICD-10-CM | POA: Diagnosis not present

## 2021-12-13 DIAGNOSIS — Z87891 Personal history of nicotine dependence: Secondary | ICD-10-CM | POA: Diagnosis not present

## 2021-12-13 DIAGNOSIS — Z95810 Presence of automatic (implantable) cardiac defibrillator: Secondary | ICD-10-CM

## 2021-12-13 DIAGNOSIS — J432 Centrilobular emphysema: Secondary | ICD-10-CM | POA: Diagnosis not present

## 2021-12-14 NOTE — Progress Notes (Signed)
EPIC Encounter for ICM Monitoring  Patient Name: Hailey Wright is a 76 y.o. female Date: 12/14/2021 Primary Care Physican: Donnajean Lopes, MD Primary Cardiologist: Hochrein Electrophysiologist: Curt Bears 11/26/2021 Weight: 141 lbs   Spoke with patient and heart failure questions reviewed.  Pt asymptomatic for fluid accumulation.   Corvue thoracic impedance normal after starting on Jardiance.   Prescribed: Furosemide 40 mg 1 tablet daily as needed.  She takes 0.5 tablet as needed since 40 mg tends to make her legs cramp.    Labs: 12/10/2020 Creatinine 1.1, BUN 17, Potassium 5.0, Sodium 139, GFR 59.1 A complete set of results can be found in Results Review.   Recommendations:  No changes and encouraged to call if experiencing any fluid symptoms.     Follow-up plan: ICM clinic phone appointment on 12/27/2021.   91 day device clinic remote transmission 03/07/2022.     EP/Cardiology Office Visit: 02/11/2022 with Dr. Percival Spanish.     Copy of ICM check sent to Dr. Curt Bears.    3 month ICM trend: 12/11/2021.    [name], RN 12/14/2021 3:16 PM

## 2021-12-16 ENCOUNTER — Other Ambulatory Visit: Payer: Self-pay | Admitting: Acute Care

## 2021-12-16 DIAGNOSIS — Z87891 Personal history of nicotine dependence: Secondary | ICD-10-CM

## 2021-12-16 NOTE — Progress Notes (Signed)
Remote ICD transmission.   

## 2021-12-27 ENCOUNTER — Ambulatory Visit (INDEPENDENT_AMBULATORY_CARE_PROVIDER_SITE_OTHER): Payer: Medicare Other

## 2021-12-27 DIAGNOSIS — R739 Hyperglycemia, unspecified: Secondary | ICD-10-CM | POA: Diagnosis not present

## 2021-12-27 DIAGNOSIS — E785 Hyperlipidemia, unspecified: Secondary | ICD-10-CM | POA: Diagnosis not present

## 2021-12-27 DIAGNOSIS — Z95810 Presence of automatic (implantable) cardiac defibrillator: Secondary | ICD-10-CM

## 2021-12-27 DIAGNOSIS — I5042 Chronic combined systolic (congestive) and diastolic (congestive) heart failure: Secondary | ICD-10-CM

## 2021-12-27 DIAGNOSIS — I10 Essential (primary) hypertension: Secondary | ICD-10-CM | POA: Diagnosis not present

## 2021-12-27 NOTE — Progress Notes (Signed)
EPIC Encounter for ICM Monitoring  Patient Name: Hailey Wright is a 76 y.o. female Date: 12/27/2021 Primary Care Physican: Donnajean Lopes, MD Primary Cardiologist: Hochrein Electrophysiologist: Curt Bears 12/27/2021 Weight: 139 lbs   Spoke with patient and heart failure questions reviewed.  Pt asymptomatic for fluid accumulation.    Corvue thoracic impedance normal after starting on Jardiance.   Prescribed:  Furosemide 40 mg 1 tablet daily as needed.  She takes 0.5 tablet as needed since 40 mg tends to make her legs cramp.  Jardiance 10 mg take 1 tablet by mouth daily before breakfast.   Labs: 10/11/2021 Creatinine 0.98, BUN 12, Potassium 3.9, Sodium 139, GFR 60 09/22/2021 Creatinine 0.96, BUN 12, Potassium 4.1, Sodium 140, GFR 62 12/10/2020 Creatinine 1.1,   BUN 17, Potassium 5.0, Sodium 139, GFR 59.1 A complete set of results can be found in Results Review.   Recommendations:  No changes and encouraged to call if experiencing any fluid symptoms.     Follow-up plan: ICM clinic phone appointment on 01/31/2022.   91 day device clinic remote transmission 03/07/2022.     EP/Cardiology Office Visit: 02/11/2022 with Dr. Percival Spanish.     Copy of ICM check sent to Dr. Curt Bears.    3 month ICM trend: 12/27/2021.    12-14 Month ICM trend:     [name], RN 12/27/2021 1:24 PM

## 2022-01-03 DIAGNOSIS — I10 Essential (primary) hypertension: Secondary | ICD-10-CM | POA: Diagnosis not present

## 2022-01-03 DIAGNOSIS — J439 Emphysema, unspecified: Secondary | ICD-10-CM | POA: Diagnosis not present

## 2022-01-03 DIAGNOSIS — I700 Atherosclerosis of aorta: Secondary | ICD-10-CM | POA: Diagnosis not present

## 2022-01-03 DIAGNOSIS — E785 Hyperlipidemia, unspecified: Secondary | ICD-10-CM | POA: Diagnosis not present

## 2022-01-03 DIAGNOSIS — R739 Hyperglycemia, unspecified: Secondary | ICD-10-CM | POA: Diagnosis not present

## 2022-01-03 DIAGNOSIS — R1312 Dysphagia, oropharyngeal phase: Secondary | ICD-10-CM | POA: Diagnosis not present

## 2022-01-03 DIAGNOSIS — M25519 Pain in unspecified shoulder: Secondary | ICD-10-CM | POA: Diagnosis not present

## 2022-01-03 DIAGNOSIS — Z87891 Personal history of nicotine dependence: Secondary | ICD-10-CM | POA: Diagnosis not present

## 2022-01-03 DIAGNOSIS — R82998 Other abnormal findings in urine: Secondary | ICD-10-CM | POA: Diagnosis not present

## 2022-01-03 DIAGNOSIS — I2510 Atherosclerotic heart disease of native coronary artery without angina pectoris: Secondary | ICD-10-CM | POA: Diagnosis not present

## 2022-01-03 DIAGNOSIS — Z0000 Encounter for general adult medical examination without abnormal findings: Secondary | ICD-10-CM | POA: Diagnosis not present

## 2022-01-03 DIAGNOSIS — I255 Ischemic cardiomyopathy: Secondary | ICD-10-CM | POA: Diagnosis not present

## 2022-01-04 DIAGNOSIS — Z1212 Encounter for screening for malignant neoplasm of rectum: Secondary | ICD-10-CM | POA: Diagnosis not present

## 2022-01-25 MED ORDER — EMPAGLIFLOZIN 10 MG PO TABS: 10.0000 mg | ORAL_TABLET | Freq: Every day | ORAL | 3 refills | Status: AC

## 2022-01-31 ENCOUNTER — Ambulatory Visit (INDEPENDENT_AMBULATORY_CARE_PROVIDER_SITE_OTHER): Payer: Medicare Other

## 2022-01-31 DIAGNOSIS — Z95810 Presence of automatic (implantable) cardiac defibrillator: Secondary | ICD-10-CM | POA: Diagnosis not present

## 2022-01-31 DIAGNOSIS — I5042 Chronic combined systolic (congestive) and diastolic (congestive) heart failure: Secondary | ICD-10-CM

## 2022-01-31 NOTE — Progress Notes (Signed)
EPIC Encounter for ICM Monitoring ? ?Patient Name: Hailey Wright is a 76 y.o. female ?Date: 01/31/2022 ?Primary Care Physican: Donnajean Lopes, MD ?Primary Cardiologist: Percival Spanish ?Electrophysiologist: Camnitz ?12/27/2021 Weight: 139 lbs ?01/31/2022 Weight: 139 lbs ?  ?Spoke with patient and heart failure questions reviewed.  Pt reports running out of Jardiance for about 5 days and restarted 2 days.  Advised Hailey Wright helps with getting rid of fluid and once it is back in her system should help to balance fluid levels. She denies any fluid symptoms.  ?  ?Corvue thoracic impedance suggesting possible fluid accumulation starting 3/6. ?  ?Prescribed:  ?Furosemide 40 mg 1 tablet daily as needed.  She takes 0.5 tablet as needed since 40 mg tends to make her legs cramp.  ?Jardiance 10 mg take 1 tablet by mouth daily before breakfast. ?  ?Labs: ?10/11/2021 Creatinine 0.98, BUN 12, Potassium 3.9, Sodium 139, GFR 60 ?09/22/2021 Creatinine 0.96, BUN 12, Potassium 4.1, Sodium 140, GFR 62 ?12/10/2020 Creatinine 1.1,   BUN 17, Potassium 5.0, Sodium 139, GFR 59.1 ?A complete set of results can be found in Results Review. ?  ?Recommendations:  Advised to limit salt intake and will recheck fluid levels after restarting jardiance.  Advised to call if fluid symptoms develop.    ?  ?Follow-up plan: ICM clinic phone appointment on 02/08/2022 to recheck fluid levels.   91 day device clinic remote transmission 03/07/2022.   ?  ?EP/Cardiology Office Visit: 02/11/2022 with Dr. Percival Spanish.   ?  ?Copy of ICM check sent to Dr. Curt Bears.   ? ?3 month ICM trend: 01/31/2022. ? ? ? ?12-14 Month ICM trend:  ? ? ? ?Hailey Billings, RN ?01/31/2022 ?4:42 PM ? ?

## 2022-02-07 ENCOUNTER — Other Ambulatory Visit: Payer: Self-pay | Admitting: *Deleted

## 2022-02-07 MED ORDER — CLOPIDOGREL BISULFATE 75 MG PO TABS: 75.0000 mg | ORAL_TABLET | Freq: Every day | ORAL | 3 refills | Status: DC

## 2022-02-08 ENCOUNTER — Ambulatory Visit (INDEPENDENT_AMBULATORY_CARE_PROVIDER_SITE_OTHER): Payer: Medicare Other

## 2022-02-08 ENCOUNTER — Telehealth: Payer: Self-pay

## 2022-02-08 DIAGNOSIS — I5042 Chronic combined systolic (congestive) and diastolic (congestive) heart failure: Secondary | ICD-10-CM

## 2022-02-08 DIAGNOSIS — Z95810 Presence of automatic (implantable) cardiac defibrillator: Secondary | ICD-10-CM

## 2022-02-08 NOTE — Telephone Encounter (Signed)
Remote ICM transmission received.  Attempted call to patient regarding ICM remote transmission and left detailed message per DPR.  Advised to return call for any fluid symptoms or questions. Next ICM remote transmission scheduled 03/08/2022.   ? ?

## 2022-02-08 NOTE — Progress Notes (Signed)
EPIC Encounter for ICM Monitoring ? ?Patient Name: Hailey Wright is a 76 y.o. female ?Date: 02/08/2022 ?Primary Care Physican: Donnajean Lopes, MD ?Primary Cardiologist: Percival Spanish ?Electrophysiologist: Camnitz ?12/27/2021 Weight: 139 lbs ?01/31/2022 Weight: 139 lbs ?  ?Attempted call to patient and unable to reach.  Left detailed message per DPR regarding transmission. Transmission reviewed.  ?  ?Corvue thoracic impedance suggesting fluids returned close to normal. ?  ?Prescribed:  ?Furosemide 40 mg 1 tablet daily as needed.  She takes 0.5 tablet as needed since 40 mg tends to make her legs cramp.  ?Jardiance 10 mg take 1 tablet by mouth daily before breakfast. ?  ?Labs: ?10/11/2021 Creatinine 0.98, BUN 12, Potassium 3.9, Sodium 139, GFR 60 ?09/22/2021 Creatinine 0.96, BUN 12, Potassium 4.1, Sodium 140, GFR 62 ?12/10/2020 Creatinine 1.1,   BUN 17, Potassium 5.0, Sodium 139, GFR 59.1 ?A complete set of results can be found in Results Review. ?  ?Recommendations:  Left voice mail with ICM number and encouraged to call if experiencing any fluid symptoms.   ?  ?Follow-up plan: ICM clinic phone appointment on 03/08/2022.   91 day device clinic remote transmission 03/07/2022.   ?  ?EP/Cardiology Office Visit: 02/11/2022 with Dr. Percival Spanish.   ?  ?Copy of ICM check sent to Dr. Curt Bears.   ? ?3 month ICM trend: 02/08/2022. ? ? ? ?12-14 Month ICM trend:  ? ? ? ?[name], RN ?02/08/2022 ?7:57 AM ? ?

## 2022-02-10 NOTE — Progress Notes (Signed)
?  ?Cardiology Office Note ? ? ?Date:  02/11/2022  ? ?ID:  Jacquelynn [place], DOB 05-Aug-1946, MRN [id number] ? ?PCP:  Donnajean Lopes, MD  ?Cardiologist:   Minus Breeding, MD ? ?Chief Complaint  ?Patient presents with  ? Coronary Artery Disease  ? ? ?  ?History of Present Illness: ?Hailey Wright is a 76 y.o. female who presents for follow of CAD and ischemic cardiomyopathy.  She has a history of known coronary disease as previously described. She had a negative stress perfusion study in the fall of 2016.  However, she came back with symptoms of acute onset chest pain. The plan was an elective cardiac catheterization. However, prior to that presented with an acute anterior myocardial infarction. She had occlusion of the LAD with acute thrombus in the circumflex. She did have chronic occlusion of her right coronary artery. She was placed on intra-aortic balloon pump and had emergent CABG with a LIMA to the LAD and SVG to OM1. She is left with an ejection fraction of approximately 30%. She had an ICD placed.   Her EF improved on echo in July 2018 to 35%. EF was 35 - 40% in July of last year.  I have been titrating meds.   ? ?At my last visit with her she had increased chest pain.  She was started on Plavix and outpatient cardiac catheterization was arranged. LHC on 09/30/2021 showed severe native CAD with subtotal occlusion of the left main , total occlusion of the ostial LAD, and total occlusion of the LCX as well as CTO of the RCA with proximal occlusion and bridging collaterals. Prior grafts were widely patents. Therefore, continued medical management was recommended. She was continued on DAPT with Aspirin and Plavix and Imdur was increased.  ? ?Since I saw her she has done well.  She exercises walking about 3 times per week. The patient denies any new symptoms such as chest discomfort, neck or arm discomfort. There has been no new shortness of breath, PND or orthopnea. There have been no reported palpitations, presyncope  or syncope.  ? ?Past Medical History:  ?Diagnosis Date  ? Acute ST elevation myocardial infarction (STEMI) involving left anterior descending coronary artery ([place]) 02/18/2016  ? AICD (automatic cardioverter/defibrillator) present   ? CAD (coronary artery disease) 06/04/2013  ? CHF (congestive heart failure) ([place])   ? Clotting disorder ([place])   ? COPD (chronic obstructive pulmonary disease) ([place])   ? Coronary artery thrombosis ([place]) 02/18/2016  ? DVT (deep venous thrombosis) ([place]) 1980s  ? RLE  ? Hepatitis A infection 1987  ? Hyperlipidemia   ? Hypertension   ? Ischemic cardiomyopathy   ? a. Echo 5/17: EF 25-30%, anteroseptal, anterior, anteroseptal and apical akinesis, possible inferior hypokinesis, grade 1 diastolic dysfunction, no evidence of thrombus, trivial AI, mild MR, mild LAE  ? MI (myocardial infarction) ([place]) 1991  ? Inferior treated with TPA 1991, POBA RCA 1996  ? Pneumonia 1980s X 1  ? RAS (renal artery stenosis) ([place])   ? Right renal stent 2005  ? S/P Emergency CABG x 2 02/18/2016  ? LIMA to LAD, SVG to OM1, EVH via right thigh  ? Type II diabetes mellitus ([place])   ? pt denies  ? ? ?Past Surgical History:  ?Procedure Laterality Date  ? CARDIAC CATHETERIZATION N/A 02/18/2016  ? Procedure: Left Heart Cath and Coronary Angiography;  Surgeon: Leonie Man, MD;  Location: [facility];  Service: Cardiovascular;  Laterality: N/A;  ? CORONARY ANGIOPLASTY  1991  ? CORONARY ARTERY BYPASS GRAFT N/A 02/18/2016  ? Procedure: CORONARY ARTERY BYPASS GRAFTING (CABG) times 2 using left internal mammary and right greater saphenous vein;  Surgeon: Rexene Alberts, MD;  Location: [facility];  Service: Open Heart Surgery;  Laterality: N/A;  ? EP IMPLANTABLE DEVICE N/A 06/16/2016  ? Procedure: ICD Implant;  Surgeon: Will Meredith Leeds, MD;  Location: [facility];  Service: Cardiovascular;  Laterality: N/A;  ? LEFT HEART CATH AND CORS/GRAFTS ANGIOGRAPHY N/A 09/30/2021  ? Procedure: LEFT HEART CATH AND CORS/GRAFTS  ANGIOGRAPHY;  Surgeon: Shelva Majestic A;  Location: [facility];  Service: Cardiovascular;  Laterality: N/A;  ? RENAL ARTERY STENT Right 2005  ? RHINOPLASTY Bilateral 1968  ? SHOULDER SURGERY Right 2019  ? Dr Onnie Graham  ? TONSILLECTOMY AND ADENOIDECTOMY  1950s  ? ? ? ?Current Outpatient Medications  ?Medication Sig Dispense Refill  ? albuterol (VENTOLIN HFA) 108 (90 Base) MCG/ACT inhaler Inhale 2 puffs into the lungs every 6 (six) hours as needed for wheezing or shortness of breath.    ? aspirin 81 MG tablet Take 81 mg by mouth daily.    ? calcium-vitamin D (OSCAL WITH D) 500-200 MG-UNIT per tablet Take 1 tablet by mouth 2 (two) times daily.    ? carboxymethylcellulose (REFRESH PLUS) 0.5 % SOLN Place 2 drops into both eyes daily as needed (for dry eyes).     ? Co-Enzyme Q10 200 MG CAPS Take 200 mg by mouth at bedtime.    ? empagliflozin (JARDIANCE) 10 MG TABS tablet Take 1 tablet (10 mg total) by mouth daily before breakfast. 90 tablet 3  ? ENTRESTO 24-26 MG TAKE 1 TABLET TWICE A DAY 180 tablet 3  ? ezetimibe (ZETIA) 10 MG tablet Take 10 mg by mouth at bedtime.    ? furosemide (LASIX) 40 MG tablet TAKE 1 TABLET BY MOUTH AS NEEDED (Patient taking differently: Take 20 mg by mouth daily as needed for fluid or edema.) 30 tablet 3  ? isosorbide mononitrate (IMDUR) 120 MG 24 hr tablet Take 1 tablet (120 mg total) by mouth daily. 90 tablet 1  ? Magnesium Oxide 420 MG TABS Take 420 mg by mouth daily.    ? metoprolol succinate (TOPROL-XL) 25 MG 24 hr tablet TAKE 1 TABLET DAILY (OVERDUE FOR OFFICE VISIT. CALL FOR APPOINTMENT/FUTURE REFILLS) (Patient taking differently: Take 25 mg by mouth daily as needed (SBP >100).) 90 tablet 3  ? Multiple Vitamin (MULTIVITAMIN) tablet Take 1 tablet by mouth daily.    ? rosuvastatin (CRESTOR) 40 MG tablet Take 1 tablet (40 mg total) by mouth daily. (Patient taking differently: Take 40 mg by mouth at bedtime.) 90 tablet 1  ? umeclidinium-vilanterol (ANORO ELLIPTA) 62.5-25 MCG/INH AEPB  Inhale 1 puff into the lungs at bedtime.    ? NITROSTAT 0.4 MG SL tablet Place 1 tablet (0.4 mg total) under the tongue every 5 (five) minutes as needed for chest pain. 25 tablet 1  ? ?No current facility-administered medications for this visit.  ? ? ?Allergies:   Patient has no known allergies.  ? ? ?ROS:  Please see the history of present illness.   Otherwise, review of systems are positive for none.   All other systems are reviewed and negative.  ? ? ?PHYSICAL EXAM: ?VS:  BP 90/62   Pulse 80   Ht '5\' 6"'$  (1.676 m)   Wt 141 lb (64 kg)   LMP  (LMP Unknown)   BMI 22.76 kg/m?  , BMI  Body mass index is 22.76 kg/m?. ?GENERAL:  Well appearing ?NECK:  No jugular venous distention, waveform within normal limits, carotid upstroke brisk and symmetric, no bruits, no thyromegaly ?LUNGS:  Clear to auscultation bilaterally ?CHEST:  Unremarkable ?HEART:  PMI not displaced or sustained,S1 and S2 within normal limits, no S3, no S4, no clicks, no rubs, 2 out of 6 brief apical systolic murmur radiating slightly at the LOWER tract, no diastolic murmurs ?ABD:  Flat, positive bowel sounds normal in frequency in pitch, no bruits, no rebound, no guarding, no midline pulsatile mass, no hepatomegaly, no splenomegaly ?EXT:  2 plus pulses throughout, no edema, no cyanosis no clubbing ? ? ? ?EKG:  EKG is not ordered today. ? ? ?Recent Labs: ?09/22/2021: BUN 12; Creatinine, Ser 0.96; Hemoglobin 13.5; Platelets 182; Potassium 4.1; Sodium 140  ? ? ?Lipid Panel ?No results found for: CHOL, TRIG, HDL, CHOLHDL, VLDL, LDLCALC, LDLDIRECT ?  ? ?Wt Readings from Last 3 Encounters:  ?02/11/22 141 lb (64 kg)  ?10/28/21 149 lb 6.4 oz (67.8 kg)  ?09/30/21 146 lb (66.2 kg)  ?  ? ? ?Other studies Reviewed: ?Additional studies/ records that were reviewed today include: cath. ?Review of the above records demonstrates:  Please see elsewhere in the note.   ? ? ?ASSESSMENT AND PLAN: ? ?CAD :  The patient has no new sypmtoms.  No further cardiovascular testing  is indicated.  We will continue with aggressive risk reduction and meds as listed. ?  ?Ischemic CM: Her ejection fraction is well-preserved.  No change in therapy.  Her blood pressure will not allow med titration.- ?

## 2022-02-11 ENCOUNTER — Other Ambulatory Visit: Payer: Self-pay | Admitting: Cardiology

## 2022-02-11 ENCOUNTER — Other Ambulatory Visit: Payer: Self-pay

## 2022-02-11 ENCOUNTER — Ambulatory Visit (INDEPENDENT_AMBULATORY_CARE_PROVIDER_SITE_OTHER): Payer: Medicare Other | Admitting: Cardiology

## 2022-02-11 ENCOUNTER — Encounter: Payer: Self-pay | Admitting: Cardiology

## 2022-02-11 VITALS — BP 90/62 | HR 80 | Ht 66.0 in | Wt 141.0 lb

## 2022-02-11 DIAGNOSIS — I2510 Atherosclerotic heart disease of native coronary artery without angina pectoris: Secondary | ICD-10-CM

## 2022-02-11 DIAGNOSIS — I255 Ischemic cardiomyopathy: Secondary | ICD-10-CM

## 2022-02-11 DIAGNOSIS — E785 Hyperlipidemia, unspecified: Secondary | ICD-10-CM

## 2022-02-11 DIAGNOSIS — Z95810 Presence of automatic (implantable) cardiac defibrillator: Secondary | ICD-10-CM | POA: Diagnosis not present

## 2022-02-11 MED ORDER — NITROSTAT 0.4 MG SL SUBL
0.4000 mg | SUBLINGUAL_TABLET | SUBLINGUAL | 1 refills | Status: AC | PRN
Start: 2022-02-11 — End: ?

## 2022-02-11 NOTE — Patient Instructions (Signed)
Medication Instructions:  ?STOP Plavix ? ?*If you need a refill on your cardiac medications before your next appointment, please call your pharmacy* ? ?Follow-Up: ?At [facility], you and your health needs are our priority.  As part of our continuing mission to provide you with exceptional heart care, we have created designated Provider Care Teams.  These Care Teams include your primary Cardiologist (physician) and Advanced Practice Providers (APPs -  Physician Assistants and Nurse Practitioners) who all work together to provide you with the care you need, when you need it. ? ?We recommend signing up for the patient portal called "MyChart".  Sign up information is provided on this After Visit Summary.  MyChart is used to connect with patients for Virtual Visits (Telemedicine).  Patients are able to view lab/test results, encounter notes, upcoming appointments, etc.  Non-urgent messages can be sent to your provider as well.   ?To learn more about what you can do with MyChart, go to [email or web address].   ? ?Your next appointment:   ?6 month(s) ? ?The format for your next appointment:   ?In Person ? ?Provider:   ?Minus Breeding, MD { ? ? ? ?

## 2022-03-07 ENCOUNTER — Ambulatory Visit (INDEPENDENT_AMBULATORY_CARE_PROVIDER_SITE_OTHER): Payer: Medicare Other

## 2022-03-07 DIAGNOSIS — I255 Ischemic cardiomyopathy: Secondary | ICD-10-CM | POA: Diagnosis not present

## 2022-03-08 ENCOUNTER — Ambulatory Visit (INDEPENDENT_AMBULATORY_CARE_PROVIDER_SITE_OTHER): Payer: Medicare Other

## 2022-03-08 DIAGNOSIS — Z95810 Presence of automatic (implantable) cardiac defibrillator: Secondary | ICD-10-CM | POA: Diagnosis not present

## 2022-03-08 DIAGNOSIS — I5042 Chronic combined systolic (congestive) and diastolic (congestive) heart failure: Secondary | ICD-10-CM

## 2022-03-08 LAB — CUP PACEART REMOTE DEVICE CHECK
Battery Remaining Longevity: 41 mo
Battery Remaining Percentage: 43 %
Battery Voltage: 2.89 V
Brady Statistic AP VP Percent: 1 %
Brady Statistic AP VS Percent: 1 %
Brady Statistic AS VP Percent: 1 %
Brady Statistic AS VS Percent: 99 %
Brady Statistic RA Percent Paced: 1 %
Brady Statistic RV Percent Paced: 1 %
Date Time Interrogation Session: 20230417020017
HighPow Impedance: 70 Ohm
HighPow Impedance: 70 Ohm
Implantable Lead Implant Date: 20170727
Implantable Lead Implant Date: 20170727
Implantable Lead Location: 753859
Implantable Lead Location: 753860
Implantable Pulse Generator Implant Date: 20170727
Lead Channel Impedance Value: 360 Ohm
Lead Channel Impedance Value: 400 Ohm
Lead Channel Pacing Threshold Amplitude: 0.5 V
Lead Channel Pacing Threshold Amplitude: 0.75 V
Lead Channel Pacing Threshold Pulse Width: 0.5 ms
Lead Channel Pacing Threshold Pulse Width: 0.5 ms
Lead Channel Sensing Intrinsic Amplitude: 12 mV
Lead Channel Sensing Intrinsic Amplitude: 3.9 mV
Lead Channel Setting Pacing Amplitude: 2 V
Lead Channel Setting Pacing Amplitude: 2.5 V
Lead Channel Setting Pacing Pulse Width: 0.5 ms
Lead Channel Setting Sensing Sensitivity: 0.5 mV
Pulse Gen Serial Number: 7261095

## 2022-03-08 NOTE — Progress Notes (Signed)
EPIC Encounter for ICM Monitoring ? ?Patient Name: Hailey Wright is a 76 y.o. female ?Date: 03/08/2022 ?Primary Care Physican: Donnajean Lopes, MD ?Primary Cardiologist: Percival Spanish ?Electrophysiologist: Camnitz ?12/27/2021 Weight: 139 lbs ?01/31/2022 Weight: 139 lbs ?  ?Spoke with patient and heart failure questions reviewed.  Pt asymptomatic for fluid accumulation.  Reports feeling well at this time and voices no complaints.   She is currently on a tour in [place]. ?  ?Corvue thoracic impedance suggesting normal fluid levels. ?  ?Prescribed:  ?Furosemide 40 mg 1 tablet daily as needed.  She takes 0.5 tablet as needed since 40 mg tends to make her legs cramp.  ?Jardiance 10 mg take 1 tablet by mouth daily before breakfast. ?  ?Labs: ?10/11/2021 Creatinine 0.98, BUN 12, Potassium 3.9, Sodium 139, GFR 60 ?09/22/2021 Creatinine 0.96, BUN 12, Potassium 4.1, Sodium 140, GFR 62 ?12/10/2020 Creatinine 1.1,   BUN 17, Potassium 5.0, Sodium 139, GFR 59.1 ?A complete set of results can be found in Results Review. ?  ?Recommendations:No changes and encouraged to call if experiencing any fluid symptoms.  ?  ?Follow-up plan: ICM clinic phone appointment on 04/11/2022.   91 day device clinic remote transmission 06/06/2022.   ?  ?EP/Cardiology Office Visit: 08/05/2022 with Dr. Percival Spanish.   ?  ?Copy of ICM check sent to Dr. Curt Bears.   ? ?3 month ICM trend: 03/07/2022. ? ? ? ?12-14 Month ICM trend:  ? ? ? ?Hailey Billings, RN ?03/08/2022 ?4:54 PM ? ?

## 2022-03-10 DIAGNOSIS — D692 Other nonthrombocytopenic purpura: Secondary | ICD-10-CM | POA: Diagnosis not present

## 2022-03-10 DIAGNOSIS — L814 Other melanin hyperpigmentation: Secondary | ICD-10-CM | POA: Diagnosis not present

## 2022-03-10 DIAGNOSIS — L570 Actinic keratosis: Secondary | ICD-10-CM | POA: Diagnosis not present

## 2022-03-10 DIAGNOSIS — L821 Other seborrheic keratosis: Secondary | ICD-10-CM | POA: Diagnosis not present

## 2022-03-23 NOTE — Progress Notes (Signed)
Remote ICD transmission.   

## 2022-03-28 ENCOUNTER — Telehealth: Payer: Self-pay | Admitting: Gastroenterology

## 2022-03-28 NOTE — Telephone Encounter (Signed)
Good Morning Dr. Havery Moros, ? ?Patient called wanting to schedule her recall colonoscopy. Looking at patients chart I did not see a signed recall assessment or a letter that had been sent out. Can patient schedule procedure? Will you please review and  advise on scheduling. ? ?Thank you.   ?

## 2022-03-29 NOTE — Telephone Encounter (Signed)
There is a recall colonoscopy for this patient 04/2022. However, her EF is low, I don't think a candidate for the [place], so she will need a clinic visit with me to discuss doing case at the hospital. So, please help to book for a clinic visit. thanks ?

## 2022-03-29 NOTE — Telephone Encounter (Signed)
Spoke with patient and advised her on the recommendations and was scheduled for an OV on 5/12 at 9:50 with you.  ?

## 2022-04-01 ENCOUNTER — Ambulatory Visit (INDEPENDENT_AMBULATORY_CARE_PROVIDER_SITE_OTHER): Payer: Medicare Other | Admitting: Gastroenterology

## 2022-04-01 ENCOUNTER — Encounter: Payer: Self-pay | Admitting: Gastroenterology

## 2022-04-01 VITALS — BP 90/48 | HR 76 | Ht 66.0 in | Wt 145.1 lb

## 2022-04-01 DIAGNOSIS — I255 Ischemic cardiomyopathy: Secondary | ICD-10-CM | POA: Diagnosis not present

## 2022-04-01 DIAGNOSIS — Z86010 Personal history of colonic polyps: Secondary | ICD-10-CM

## 2022-04-01 DIAGNOSIS — I5022 Chronic systolic (congestive) heart failure: Secondary | ICD-10-CM

## 2022-04-01 NOTE — Patient Instructions (Signed)
If you are age 74 or older, your body mass index should be between 23-30. Your Body mass index is 23.42 kg/m?Marland Kitchen If this is out of the aforementioned range listed, please consider follow up with your Primary Care Provider. ? ?If you are age 82 or younger, your body mass index should be between 19-25. Your Body mass index is 23.42 kg/m?Marland Kitchen If this is out of the aformentioned range listed, please consider follow up with your Primary Care Provider.  ? ?________________________________________________________ ? ?The [facility] providers would like to encourage you to use [facility] to communicate with providers for non-urgent requests or questions.  Due to long hold times on the telephone, sending your provider a message by [facility] may be a faster and more efficient way to get a response.  Please allow 48 business hours for a response.  Please remember that this is for non-urgent requests.  ?_______________________________________________________ ? ?We will add you to the wait list for a colonoscopy procedure at the hospital later this Summer/Fall. ? ?Thank you for entrusting me with your care and for choosing [employer], ?Dr. Th[place]ville Cellar ?  ?

## 2022-04-01 NOTE — Progress Notes (Signed)
? ?HPI :  ?76 year old female with a history of colon polyps, CHF, here for follow-up visit to discuss possible surveillance colonoscopy.  She last had a colonoscopy with me in June 2020.  She had 5 small adenomas removed, no high risk lesions.  Her colon was quite tortuous, difficult cecal intubation with severe left-sided diverticular disease. ? ?She really denies any changes in her bowel since she is last been seen.  She has occasional constipation, but mostly has regular stools.  No blood in her stools.  She does not really take anything for her bowels routinely, occasional laxative as needed. ? ?She does have a history of COPD, CAD, CHF.  She is followed closely by cardiology.  She does not require any supplemental oxygen.  Her EF most recently is 30 to 35%.  She denies any functional limitations, denies any exertional dyspnea or chest pain.  She otherwise feels well.  We discussed if and when she wanted a surveillance colonoscopy and what that would entail.  No anemia reported. ? ? ?Prior work-up: ?Colonoscopy 05/08/19:The perianal and digital rectal examinations were normal. ?- The colon was tortuous, difficult cecal intubation, severe left sided diverticular disease. ?- Many medium-mouthed diverticula were found in the sigmoid colon. ?- Two sessile polyps were found in the ascending colon. The polyps were 3 to 4 mm in size. ?These polyps were removed with a cold snare. Resection and retrieval were complete. ?- Two sessile polyps were found in the hepatic flexure. The polyps were 3 mm in size. These ?polyps were removed with a cold snare. Resection and retrieval were complete. ?- A diminutive polyp was found in the sigmoid colon. The polyp was sessile. The polyp was ?removed with a cold snare. Resection and retrieval were complete. ?- The exam was otherwise without abnormality. ? ?Surgical [P], ascending, transverse, sigmoid and hepatic flexure, polyp (5) ?- TUBULAR ADENOMA(S). ?- HIGH GRADE DYSPLASIA IS NOT  IDENTIFIED. ? ? ?Echocardiogram 11/18/21 - EF 30-35%, grade I DD ? ? ?Past Medical History:  ?Diagnosis Date  ? Acute ST elevation myocardial infarction (STEMI) involving left anterior descending coronary artery ([place]) 02/18/2016  ? AICD (automatic cardioverter/defibrillator) present   ? CAD (coronary artery disease) 06/04/2013  ? CHF (congestive heart failure) ([place])   ? Clotting disorder ([place])   ? COPD (chronic obstructive pulmonary disease) ([place])   ? Coronary artery thrombosis ([place]) 02/18/2016  ? DVT (deep venous thrombosis) ([place]) 1980s  ? RLE  ? Hepatitis A infection 1987  ? Hyperlipidemia   ? Hypertension   ? Ischemic cardiomyopathy   ? a. Echo 5/17: EF 25-30%, anteroseptal, anterior, anteroseptal and apical akinesis, possible inferior hypokinesis, grade 1 diastolic dysfunction, no evidence of thrombus, trivial AI, mild MR, mild LAE  ? MI (myocardial infarction) ([place]) 1991  ? Inferior treated with TPA 1991, POBA RCA 1996  ? Pneumonia 1980s X 1  ? RAS (renal artery stenosis) ([place])   ? Right renal stent 2005  ? S/P Emergency CABG x 2 02/18/2016  ? LIMA to LAD, SVG to OM1, EVH via right thigh  ? Type II diabetes mellitus ([place])   ? pt denies  ? ? ? ?Past Surgical History:  ?Procedure Laterality Date  ? CARDIAC CATHETERIZATION N/A 02/18/2016  ? Procedure: Left Heart Cath and Coronary Angiography;  Surgeon: Leonie Man, MD;  Location: [facility];  Service: Cardiovascular;  Laterality: N/A;  ? CORONARY ANGIOPLASTY  1991  ? CORONARY ARTERY BYPASS GRAFT N/A 02/18/2016  ? Procedure: CORONARY ARTERY  BYPASS GRAFTING (CABG) times 2 using left internal mammary and right greater saphenous vein;  Surgeon: Rexene Alberts, MD;  Location: [facility];  Service: Open Heart Surgery;  Laterality: N/A;  ? EP IMPLANTABLE DEVICE N/A 06/16/2016  ? Procedure: ICD Implant;  Surgeon: Will Meredith Leeds, MD;  Location: [facility];  Service: Cardiovascular;  Laterality: N/A;  ? LEFT HEART CATH AND CORS/GRAFTS ANGIOGRAPHY N/A  09/30/2021  ? Procedure: LEFT HEART CATH AND CORS/GRAFTS ANGIOGRAPHY;  Surgeon: Shelva Majestic A;  Location: [facility];  Service: Cardiovascular;  Laterality: N/A;  ? RENAL ARTERY STENT Right 2005  ? RHINOPLASTY Bilateral 1968  ? SHOULDER SURGERY Right 2019  ? Dr Onnie Graham  ? TONSILLECTOMY AND ADENOIDECTOMY  1950s  ? ?Family History  ?Problem Relation Age of Onset  ? Cancer Mother   ?     metastatic from small intestine  ? Heart attack Father 32  ?     Fatal MI 56  ? Heart attack Brother 31  ? Colon cancer Neg Hx   ? Esophageal cancer Neg Hx   ? Rectal cancer Neg Hx   ? Stomach cancer Neg Hx   ? Pancreatic cancer Neg Hx   ? ?Social History  ? ?Tobacco Use  ? Smoking status: Former  ?  Packs/day: 1.00  ?  Years: 40.00  ?  Pack years: 40.00  ?  Types: Cigarettes  ?  Start date: 11/26/1966  ?  Quit date: 06/05/2011  ?  Years since quitting: 10.8  ? Smokeless tobacco: Never  ?Vaping Use  ? Vaping Use: Never used  ?Substance Use Topics  ? Alcohol use: No  ? Drug use: No  ? ?Current Outpatient Medications  ?Medication Sig Dispense Refill  ? albuterol (VENTOLIN HFA) 108 (90 Base) MCG/ACT inhaler Inhale 2 puffs into the lungs every 6 (six) hours as needed for wheezing or shortness of breath.    ? aspirin 81 MG tablet Take 81 mg by mouth daily.    ? calcium-vitamin D (OSCAL WITH D) 500-200 MG-UNIT per tablet Take 1 tablet by mouth 2 (two) times daily.    ? carboxymethylcellulose (REFRESH PLUS) 0.5 % SOLN Place 2 drops into both eyes daily as needed (for dry eyes).     ? Co-Enzyme Q10 200 MG CAPS Take 200 mg by mouth at bedtime.    ? empagliflozin (JARDIANCE) 10 MG TABS tablet Take 1 tablet (10 mg total) by mouth daily before breakfast. 90 tablet 3  ? ENTRESTO 24-26 MG TAKE 1 TABLET TWICE A DAY 180 tablet 3  ? ezetimibe (ZETIA) 10 MG tablet Take 10 mg by mouth at bedtime.    ? furosemide (LASIX) 40 MG tablet TAKE 1 TABLET BY MOUTH AS NEEDED 30 tablet 3  ? isosorbide mononitrate (IMDUR) 120 MG 24 hr tablet Take 1 tablet (120  mg total) by mouth daily. 90 tablet 1  ? Magnesium Oxide 420 MG TABS Take 420 mg by mouth daily.    ? metoprolol succinate (TOPROL-XL) 25 MG 24 hr tablet TAKE 1 TABLET DAILY (OVERDUE FOR OFFICE VISIT. CALL FOR APPOINTMENT/FUTURE REFILLS) (Patient taking differently: Take 25 mg by mouth daily as needed (SBP >100).) 90 tablet 3  ? Multiple Vitamin (MULTIVITAMIN) tablet Take 1 tablet by mouth daily.    ? NITROSTAT 0.4 MG SL tablet Place 1 tablet (0.4 mg total) under the tongue every 5 (five) minutes as needed for chest pain. 25 tablet 1  ? rosuvastatin (CRESTOR) 40 MG tablet Take  1 tablet (40 mg total) by mouth daily. 90 tablet 1  ? umeclidinium-vilanterol (ANORO ELLIPTA) 62.5-25 MCG/INH AEPB Inhale 1 puff into the lungs at bedtime.    ? ?No current facility-administered medications for this visit.  ? ?No Known Allergies ? ? ?Review of Systems: ?All systems reviewed and negative except where noted in HPI.  ? ?Lab Results  ?Component Value Date  ? WBC 5.9 09/22/2021  ? HGB 13.5 09/22/2021  ? HCT 40.8 09/22/2021  ? MCV 91 09/22/2021  ? PLT 182 09/22/2021  ? ? ?Lab Results  ?Component Value Date  ? CREATININE 0.96 09/22/2021  ? BUN 12 09/22/2021  ? NA 140 09/22/2021  ? K 4.1 09/22/2021  ? CL 103 09/22/2021  ? CO2 22 09/22/2021  ? ? ? ?Physical Exam: ?BP (!) 90/48   Pulse 76   Ht '5\' 6"'$  (1.676 m)   Wt 145 lb 2 oz (65.8 kg)   LMP  (LMP Unknown)   SpO2 97%   BMI 23.42 kg/m?  ?Constitutional: Pleasant,well-developed, female in no acute distress. ?Neurological: Alert and oriented to person place and time. ?Psychiatric: Normal mood and affect. Behavior is normal. ? ? ?ASSESSMENT AND PLAN: ?76 year old female here for reassessment the following: ? ?History of colon polyps ?CHF ? ?Patient had 5 small adenomas removed over 3 years ago, based on national surveillance guidelines she is due for colonoscopy.  She has no significant bowel problems that bother her.  Occasional constipation for which she can take mild laxative such  as MiraLAX or Citrucel as needed.  We discussed her cardiac history, while she has no symptoms from this, she is higher than average risk for anesthesia given her ejection fraction.  If we wish to pursue colonoscop

## 2022-04-11 ENCOUNTER — Ambulatory Visit (INDEPENDENT_AMBULATORY_CARE_PROVIDER_SITE_OTHER): Payer: Medicare Other

## 2022-04-11 DIAGNOSIS — I5042 Chronic combined systolic (congestive) and diastolic (congestive) heart failure: Secondary | ICD-10-CM

## 2022-04-11 DIAGNOSIS — Z95810 Presence of automatic (implantable) cardiac defibrillator: Secondary | ICD-10-CM

## 2022-04-13 ENCOUNTER — Telehealth: Payer: Self-pay

## 2022-04-13 NOTE — Telephone Encounter (Signed)
Remote ICM transmission received.  Attempted call to patient regarding ICM remote transmission and left detailed message per [facility] return call.  Advised to return call for any fluid symptoms or questions. Next ICM remote transmission scheduled 05/16/2022.

## 2022-04-13 NOTE — Progress Notes (Signed)
EPIC Encounter for ICM Monitoring  Patient Name: Hailey Wright is a 76 y.o. female Date: 04/13/2022 Primary Care Physican: Donnajean Lopes, MD Primary Cardiologist: Hochrein Electrophysiologist: Curt Bears 12/27/2021 Weight: 139 lbs 01/31/2022 Weight: 139 lbs   Attempted call to patient and unable to reach.  Left detailed message per DPR regarding transmission. Transmission reviewed.    [place] thoracic impedance suggesting normal fluid levels.   Prescribed:  Furosemide 40 mg 1 tablet daily as needed.  She takes 0.5 tablet as needed since 40 mg tends to make her legs cramp.  Jardiance 10 mg take 1 tablet by mouth daily before breakfast.   Labs: 10/11/2021 Creatinine 0.98, BUN 12, Potassium 3.9, Sodium 139, GFR 60 09/22/2021 Creatinine 0.96, BUN 12, Potassium 4.1, Sodium 140, GFR 62 12/10/2020 Creatinine 1.1,   BUN 17, Potassium 5.0, Sodium 139, GFR 59.1 A complete set of results can be found in Results Review.   Recommendations:  Left voice mail with ICM number and encouraged to call if experiencing any fluid symptoms.   Follow-up plan: ICM clinic phone appointment on 05/16/2022.   91 day device clinic remote transmission 06/06/2022.     EP/Cardiology Office Visit: 08/05/2022 with Dr. Percival Spanish.     Copy of ICM check sent to Dr. Curt Bears.   3 month ICM trend: 04/11/2022.    12-14 Month ICM trend:     [name], RN 04/13/2022 12:10 PM

## 2022-04-26 ENCOUNTER — Telehealth: Payer: Self-pay

## 2022-04-26 ENCOUNTER — Other Ambulatory Visit: Payer: Self-pay

## 2022-04-26 DIAGNOSIS — Z86010 Personal history of colonic polyps: Secondary | ICD-10-CM

## 2022-04-26 MED ORDER — PLENVU 140 G PO SOLR: 1.0000 | ORAL | 0 refills | Status: DC

## 2022-04-26 NOTE — Telephone Encounter (Signed)
Pt returned call and would like to have colonoscopy on Monday, 05/02/22 at 8:15 am. Pt is aware that she will need to arrive at [facility] by 6:45 am with a care partner. Pt confirms that she does have access to her MyChart, I told her that I will send her instructions there for her review. Pt denies being anticoagulated, other than aspirin 81 mg. Pt is not a diabetic. Pt [facility]. Pt knows to pick up prep within the next day or 2 or the pharmacy will re-shelve it. Pt verbalized understanding and had no concerns at the end of the call.   Ambulatory referral to GI in epic. Colon instructions sent to patient via MyChart. SUPREP not on formulary. PLENVU sent to pharmacy on file.

## 2022-04-26 NOTE — Telephone Encounter (Signed)
Lm on vm for pt to return call to offer her an appt at [facility] on 05/02/22.

## 2022-04-27 ENCOUNTER — Encounter: Payer: Self-pay | Admitting: Gastroenterology

## 2022-04-27 ENCOUNTER — Encounter (HOSPITAL_COMMUNITY): Payer: Self-pay | Admitting: Gastroenterology

## 2022-05-02 ENCOUNTER — Ambulatory Visit (HOSPITAL_COMMUNITY)
Admission: RE | Admit: 2022-05-02 | Discharge: 2022-05-02 | Disposition: A | Discharge status: Home / Self Care | Payer: Medicare Other | Attending: Gastroenterology | Admitting: Gastroenterology

## 2022-05-02 ENCOUNTER — Encounter (HOSPITAL_COMMUNITY)
Admission: RE | Disposition: A | Discharge status: Home / Self Care | Payer: Self-pay | Source: Home / Self Care | Attending: Gastroenterology

## 2022-05-02 ENCOUNTER — Ambulatory Visit (HOSPITAL_BASED_OUTPATIENT_CLINIC_OR_DEPARTMENT_OTHER): Payer: Medicare Other | Admitting: Anesthesiology

## 2022-05-02 ENCOUNTER — Ambulatory Visit (HOSPITAL_COMMUNITY): Payer: Medicare Other | Admitting: Anesthesiology

## 2022-05-02 ENCOUNTER — Encounter (HOSPITAL_COMMUNITY): Payer: Self-pay | Admitting: Gastroenterology

## 2022-05-02 ENCOUNTER — Other Ambulatory Visit: Payer: Self-pay

## 2022-05-02 DIAGNOSIS — E1151 Type 2 diabetes mellitus with diabetic peripheral angiopathy without gangrene: Secondary | ICD-10-CM | POA: Diagnosis not present

## 2022-05-02 DIAGNOSIS — K5730 Diverticulosis of large intestine without perforation or abscess without bleeding: Secondary | ICD-10-CM | POA: Insufficient documentation

## 2022-05-02 DIAGNOSIS — Z87891 Personal history of nicotine dependence: Secondary | ICD-10-CM | POA: Insufficient documentation

## 2022-05-02 DIAGNOSIS — K635 Polyp of colon: Secondary | ICD-10-CM

## 2022-05-02 DIAGNOSIS — Z860100 Personal history of colon polyps, unspecified: Secondary | ICD-10-CM

## 2022-05-02 DIAGNOSIS — I252 Old myocardial infarction: Secondary | ICD-10-CM | POA: Diagnosis not present

## 2022-05-02 DIAGNOSIS — I110 Hypertensive heart disease with heart failure: Secondary | ICD-10-CM | POA: Insufficient documentation

## 2022-05-02 DIAGNOSIS — I509 Heart failure, unspecified: Secondary | ICD-10-CM | POA: Insufficient documentation

## 2022-05-02 DIAGNOSIS — I071 Rheumatic tricuspid insufficiency: Secondary | ICD-10-CM | POA: Insufficient documentation

## 2022-05-02 DIAGNOSIS — D126 Benign neoplasm of colon, unspecified: Secondary | ICD-10-CM

## 2022-05-02 DIAGNOSIS — D122 Benign neoplasm of ascending colon: Secondary | ICD-10-CM | POA: Insufficient documentation

## 2022-05-02 DIAGNOSIS — J449 Chronic obstructive pulmonary disease, unspecified: Secondary | ICD-10-CM | POA: Insufficient documentation

## 2022-05-02 DIAGNOSIS — I2510 Atherosclerotic heart disease of native coronary artery without angina pectoris: Secondary | ICD-10-CM | POA: Insufficient documentation

## 2022-05-02 DIAGNOSIS — Z95810 Presence of automatic (implantable) cardiac defibrillator: Secondary | ICD-10-CM | POA: Insufficient documentation

## 2022-05-02 DIAGNOSIS — I700 Atherosclerosis of aorta: Secondary | ICD-10-CM | POA: Insufficient documentation

## 2022-05-02 DIAGNOSIS — Z1211 Encounter for screening for malignant neoplasm of colon: Secondary | ICD-10-CM | POA: Insufficient documentation

## 2022-05-02 DIAGNOSIS — Z951 Presence of aortocoronary bypass graft: Secondary | ICD-10-CM | POA: Diagnosis not present

## 2022-05-02 DIAGNOSIS — K6389 Other specified diseases of intestine: Secondary | ICD-10-CM | POA: Diagnosis not present

## 2022-05-02 HISTORY — PX: COLONOSCOPY WITH PROPOFOL: SHX5780

## 2022-05-02 HISTORY — PX: POLYPECTOMY: SHX5525

## 2022-05-02 SURGERY — COLONOSCOPY WITH PROPOFOL
Anesthesia: Monitor Anesthesia Care

## 2022-05-02 MED ORDER — PROPOFOL 500 MG/50ML IV EMUL
INTRAVENOUS | Status: DC | PRN
  Administered 2022-05-02: 100 ug/kg/min via INTRAVENOUS

## 2022-05-02 MED ORDER — SODIUM CHLORIDE 0.9 % IV SOLN: INTRAVENOUS | Status: DC

## 2022-05-02 MED ORDER — PROPOFOL 10 MG/ML IV BOLUS
INTRAVENOUS | Status: DC | PRN
  Administered 2022-05-02: 40 mg via INTRAVENOUS
  Administered 2022-05-02: 20 mg via INTRAVENOUS

## 2022-05-02 MED ORDER — LACTATED RINGERS IV SOLN: INTRAVENOUS | Status: DC

## 2022-05-02 MED ORDER — PHENYLEPHRINE 80 MCG/ML (10ML) SYRINGE FOR IV PUSH (FOR BLOOD PRESSURE SUPPORT)
PREFILLED_SYRINGE | INTRAVENOUS | Status: DC | PRN
  Administered 2022-05-02: 80 ug via INTRAVENOUS
  Administered 2022-05-02 (×2): 160 ug via INTRAVENOUS
  Administered 2022-05-02: 80 ug via INTRAVENOUS
  Administered 2022-05-02: 160 ug via INTRAVENOUS

## 2022-05-02 MED ORDER — PHENYLEPHRINE HCL-NACL 20-0.9 MG/250ML-% IV SOLN
INTRAVENOUS | Status: DC | PRN
  Administered 2022-05-02: 50 ug/min via INTRAVENOUS

## 2022-05-02 SURGICAL SUPPLY — 22 items

## 2022-05-02 NOTE — H&P (Signed)
[facility] History and Physical   Primary Care Physician:  Donnajean Lopes, MD   Reason for Procedure:   History of colon polyps  Plan:    colonoscopy     HPI: Hailey Wright is a 76 y.o. female  here for colonoscopy surveillance - 5 adenomas removed 2020. Has occasional constipation but bowels mostly regular. No family history of colon cancer known. Otherwise feels well without any cardiopulmonary symptoms today. Exam done at the hospital for anesthesia support given history of CHF, EF 30-35%. I have discussed risks / benefits and she wishes to proceed.   Past Medical History:  Diagnosis Date   Acute ST elevation myocardial infarction (STEMI) involving left anterior descending coronary artery ([facility]) 02/18/2016   AICD (automatic cardioverter/defibrillator) present    CAD (coronary artery disease) 06/04/2013   CHF (congestive heart failure) ([facility])    Clotting disorder ([facility])    COPD (chronic obstructive pulmonary disease) ([facility])    Coronary artery thrombosis ([place]) 02/18/2016   DVT (deep venous thrombosis) ([place]) 1980s   RLE   Hepatitis A infection 1987   Hyperlipidemia    Hypertension    Ischemic cardiomyopathy    a. Echo 5/17: EF 25-30%, anteroseptal, anterior, anteroseptal and apical akinesis, possible inferior hypokinesis, grade 1 diastolic dysfunction, no evidence of thrombus, trivial AI, mild MR, mild LAE   MI (myocardial infarction) ([place]) 1991   Inferior treated with TPA 1991, POBA RCA 1996   Pneumonia 1980s X 1   RAS (renal artery stenosis) ([place])    Right renal stent 2005   S/P Emergency CABG x 2 02/18/2016   LIMA to LAD, SVG to OM1, EVH via right thigh   Type II diabetes mellitus ([place])    pt denies    Past Surgical History:  Procedure Laterality Date   CARDIAC CATHETERIZATION N/A 02/18/2016   Procedure: Left Heart Cath and Coronary Angiography;  Surgeon: Leonie Man, MD;  Location: [facility];  Service: Cardiovascular;  Laterality: N/A;   CORONARY  ANGIOPLASTY  1991   CORONARY ARTERY BYPASS GRAFT N/A 02/18/2016   Procedure: CORONARY ARTERY BYPASS GRAFTING (CABG) times 2 using left internal mammary and right greater saphenous vein;  Surgeon: Rexene Alberts, MD;  Location: [facility];  Service: Open Heart Surgery;  Laterality: N/A;   EP IMPLANTABLE DEVICE N/A 06/16/2016   Procedure: ICD Implant;  Surgeon: Will Meredith Leeds, MD;  Location: [facility];  Service: Cardiovascular;  Laterality: N/A;   LEFT HEART CATH AND CORS/GRAFTS ANGIOGRAPHY N/A 09/30/2021   Procedure: LEFT HEART CATH AND CORS/GRAFTS ANGIOGRAPHY;  Surgeon: Shelva Majestic A;  Location: [facility];  Service: Cardiovascular;  Laterality: N/A;   RENAL ARTERY STENT Right 2005   RHINOPLASTY Bilateral 1968   SHOULDER SURGERY Right 2019   Dr Onnie Graham   TONSILLECTOMY AND ADENOIDECTOMY  1950s    Prior to Admission medications   Medication Sig Start Date End Date Taking? Authorizing Provider  albuterol (VENTOLIN HFA) 108 (90 Base) MCG/ACT inhaler Inhale 2 puffs into the lungs every 6 (six) hours as needed for wheezing or shortness of breath.   Yes [provider]  aspirin 81 MG tablet Take 81 mg by mouth daily.   Yes [provider]  calcium-vitamin D (OSCAL WITH D) 500-200 MG-UNIT per tablet Take 1 tablet by mouth 2 (two) times daily.   Yes [provider]  carboxymethylcellulose (REFRESH PLUS) 0.5 % SOLN Place 1 drop into both eyes 4 (four) times daily.  Yes [provider]  Co-Enzyme Q10 200 MG CAPS Take 200 mg by mouth at bedtime.   Yes [provider]  empagliflozin (JARDIANCE) 10 MG TABS tablet Take 1 tablet (10 mg total) by mouth daily before breakfast. 01/25/22  Yes Sande Rives E, PA-C  ENTRESTO 24-26 MG TAKE 1 TABLET TWICE A DAY 01/31/20  Yes Minus Breeding, MD  ezetimibe (ZETIA) 10 MG tablet Take 10 mg by mouth at bedtime.   Yes [provider]  furosemide (LASIX) 40 MG tablet TAKE 1 TABLET BY MOUTH AS  NEEDED Patient taking differently: Take 20 mg by mouth daily as needed for edema. 12/07/20  Yes Minus Breeding, MD  isosorbide mononitrate (IMDUR) 120 MG 24 hr tablet Take 1 tablet (120 mg total) by mouth daily. 10/06/21  Yes Minus Breeding, MD  Magnesium Oxide 420 MG TABS Take 420 mg by mouth daily.   Yes [provider]  metoprolol succinate (TOPROL-XL) 25 MG 24 hr tablet TAKE 1 TABLET DAILY (OVERDUE FOR OFFICE VISIT. CALL FOR APPOINTMENT/FUTURE REFILLS) Patient taking differently: Take 25 mg by mouth daily as needed (SBP >100). 06/04/20  Yes Minus Breeding, MD  Multiple Vitamin (MULTIVITAMIN) tablet Take 1 tablet by mouth daily.   Yes [provider]  NITROSTAT 0.4 MG SL tablet Place 1 tablet (0.4 mg total) under the tongue every 5 (five) minutes as needed for chest pain. 02/11/22  Yes Minus Breeding, MD  rosuvastatin (CRESTOR) 40 MG tablet Take 1 tablet (40 mg total) by mouth daily. 03/19/20  Yes Hochrein, Jeneen Rinks, MD  umeclidinium-vilanterol (ANORO ELLIPTA) 62.5-25 MCG/INH AEPB Inhale 1 puff into the lungs at bedtime.   Yes [provider]  PEG-KCl-NaCl-NaSulf-Na Asc-C (PLENVU) 140 g SOLR Take 1 kit by mouth as directed. 04/26/22   Shaquala Broeker, Carlota Raspberry, MD    Current Facility-Administered Medications  Medication Dose Route Frequency Provider Last Rate Last Admin   0.9 %  sodium chloride infusion   Intravenous Continuous Braddock Servellon, Carlota Raspberry, MD       lactated ringers infusion   Intravenous Continuous Antionette Luster, Carlota Raspberry, MD        Allergies as of 04/26/2022   (No Known Allergies)    Family History  Problem Relation Age of Onset   Cancer Mother        metastatic from small intestine   Heart attack Father 90       Fatal MI 50   Heart attack Brother 32   Colon cancer Neg Hx    Esophageal cancer Neg Hx    Rectal cancer Neg Hx    Stomach cancer Neg Hx    Pancreatic cancer Neg Hx     Social History   Socioeconomic History   Marital status: Married     Spouse name: Tommy   Number of children: 1   Years of education: Not on file   Highest education level: Master's degree (e.g., MA, MS, MEng, MEd, MSW, MBA)  Occupational History    Comment: retired  Tobacco Use   Smoking status: Former    Packs/day: 1.00    Years: 40.00    Total pack years: 40.00    Types: Cigarettes    Start date: 11/26/1966    Quit date: 06/05/2011    Years since quitting: 10.9   Smokeless tobacco: Never  Vaping Use   Vaping Use: Never used  Substance and Sexual Activity   Alcohol use: No   Drug use: No   Sexual activity: Not on file  Other Topics  Concern   Not on file  Social History Narrative   Lives with husband.   Caffeine- 3-4 c daily   Social Determinants of Health   Financial Resource Strain: Not on file  Food Insecurity: Not on file  Transportation Needs: Not on file  Physical Activity: Not on file  Stress: Not on file  Social Connections: Not on file  Intimate Partner Violence: Not on file    Review of Systems: All other review of systems negative except as mentioned in the HPI.  Physical Exam: Vital signs LMP  (LMP Unknown)   General:   Alert,  Well-developed, pleasant and cooperative in NAD Lungs:  Clear throughout to auscultation.   Heart:  Regular rate and rhythm Abdomen:  Soft, nontender and nondistended.   Neuro/Psych:  Alert and cooperative. Normal mood and affect. A and O x 3  Jolly Mango, MD [facility]

## 2022-05-02 NOTE — Discharge Instructions (Signed)
YOU HAD AN ENDOSCOPIC PROCEDURE TODAY: Refer to the procedure report and other information in the discharge instructions given to you for any specific questions about what was found during the examination. If this information does not answer your questions, please call [place] office at [phone] to clarify.  ° °YOU SHOULD EXPECT: Some feelings of bloating in the abdomen. Passage of more gas than usual. Walking can help get rid of the air that was put into your GI tract during the procedure and reduce the bloating. If you had a lower endoscopy (such as a colonoscopy or flexible sigmoidoscopy) you may notice spotting of blood in your stool or on the toilet paper. Some abdominal soreness may be present for a day or two, also. ° °DIET: Your first meal following the procedure should be a light meal and then it is ok to progress to your normal diet. A half-sandwich or bowl of soup is an example of a good first meal. Heavy or fried foods are harder to digest and may make you feel nauseous or bloated. Drink plenty of fluids but you should avoid alcoholic beverages for 24 hours. If you had a esophageal dilation, please see attached instructions for diet.   ° °ACTIVITY: Your care partner should take you home directly after the procedure. You should plan to take it easy, moving slowly for the rest of the day. You can resume normal activity the day after the procedure however YOU SHOULD NOT DRIVE, use power tools, machinery or perform tasks that involve climbing or major physical exertion for 24 hours (because of the sedation medicines used during the test).  ° °SYMPTOMS TO REPORT IMMEDIATELY: °A gastroenterologist can be reached at any hour. Please call [phone]  for any of the following symptoms:  °Following lower endoscopy (colonoscopy, flexible sigmoidoscopy) °Excessive amounts of blood in the stool  °Significant tenderness, worsening of abdominal pains  °Swelling of the abdomen that is new, acute  °Fever of 100° or  higher  °Following upper endoscopy (EGD, EUS, ERCP, esophageal dilation) °Vomiting of blood or coffee ground material  °New, significant abdominal pain  °New, significant chest pain or pain under the shoulder blades  °Painful or persistently difficult swallowing  °New shortness of breath  °Black, tarry-looking or red, bloody stools ° °FOLLOW UP:  °If any biopsies were taken you will be contacted by phone or by letter within the next 1-3 weeks. Call [phone]  if you have not heard about the biopsies in 3 weeks.  °Please also call with any specific questions about appointments or follow up tests. ° °

## 2022-05-02 NOTE — Anesthesia Preprocedure Evaluation (Addendum)
Anesthesia Evaluation  Patient identified by MRN, date of birth, ID band Patient awake    Reviewed: Allergy & Precautions, NPO status , Patient's Chart, lab work & pertinent test results  Airway Mallampati: II  TM Distance: >3 FB Neck ROM: Full    Dental  (+) Teeth Intact, Dental Advisory Given   Pulmonary COPD,  COPD inhaler, former smoker,    breath sounds clear to auscultation       Cardiovascular hypertension, Pt. on home beta blockers + CAD, + Past MI, + CABG, + Peripheral Vascular Disease and +CHF  + Cardiac Defibrillator  Rhythm:Regular Rate:Normal  Echo: 1. Left ventricular ejection fraction, by estimation, is 30 to 35%. The  left ventricle has moderately decreased function. The left ventricle  demonstrates regional wall motion abnormalities (see scoring  diagram/findings for description). Left ventricular  diastolic parameters are consistent with Grade I diastolic dysfunction  (impaired relaxation).  2. Right ventricular systolic function is normal. The right ventricular  size is normal. There is normal pulmonary artery systolic pressure. The  estimated right ventricular systolic pressure is 08.6 mmHg.  3. The mitral valve is grossly normal. Trivial mitral valve  regurgitation. No evidence of mitral stenosis.  4. Tricuspid valve regurgitation is mild to moderate.  5. The aortic valve is tricuspid. There is mild calcification of the  aortic valve. There is mild thickening of the aortic valve. Aortic valve  regurgitation is not visualized. Aortic valve sclerosis is present, with  no evidence of aortic valve stenosis.  6. The inferior vena cava is normal in size with greater than 50%  respiratory variability, suggesting right atrial pressure of 3 mmHg.    Neuro/Psych negative neurological ROS  negative psych ROS   GI/Hepatic negative GI ROS, (+) Hepatitis -  Endo/Other  diabetes, Type 2  Renal/GU negative  Renal ROS     Musculoskeletal negative musculoskeletal ROS (+)   Abdominal Normal abdominal exam  (+)   Peds  Hematology negative hematology ROS (+)   Anesthesia Other Findings   Reproductive/Obstetrics                            Anesthesia Physical Anesthesia Plan  ASA: 4  Anesthesia Plan: MAC   Post-op Pain Management:    Induction: Intravenous  PONV Risk Score and Plan: 0 and Propofol infusion  Airway Management Planned: Simple Face Mask and Natural Airway  Additional Equipment: None  Intra-op Plan:   Post-operative Plan:   Informed Consent: I have reviewed the patients History and Physical, chart, labs and discussed the procedure including the risks, benefits and alternatives for the proposed anesthesia with the patient or authorized representative who has indicated his/her understanding and acceptance.       Plan Discussed with: CRNA  Anesthesia Plan Comments:        Anesthesia Quick Evaluation

## 2022-05-02 NOTE — Op Note (Signed)
Hailey Wright Patient Name: Hailey Wright Procedure Date: 05/02/2022 MRN: [id number] Attending MD: Carlota Raspberry. Havery Moros , MD Date of Birth: [birth year] CSN: [id number] Age: 76 Admit Type: Outpatient Procedure:                Colonoscopy Indications:              High risk colon cancer surveillance: Personal                            history of colonic polyps - 5 adenomas removed                            04/2019 Providers:                Remo Lipps P. Havery Moros, MD, Carlyn Reichert, RN,                            William Dalton, Technician, Darliss Cheney, Technician Referring MD:              Medicines:                Monitored Anesthesia Care Complications:            No immediate complications. Estimated blood loss:                            Minimal. Estimated Blood Loss:     Estimated blood loss was minimal. Procedure:                Pre-Anesthesia Assessment:                           - Prior to the procedure, a History and Physical                            was performed, and patient medications and                            allergies were reviewed. The patient's tolerance of                            previous anesthesia was also reviewed. The risks                            and benefits of the procedure and the sedation                            options and risks were discussed with the patient.                            All questions were answered, and informed consent                            was obtained. Prior Anticoagulants: The patient has                            taken no  previous anticoagulant or antiplatelet                            agents. ASA Grade Assessment: IV - A patient with                            severe systemic disease that is a constant threat                            to life. After reviewing the risks and benefits,                            the patient was deemed in satisfactory condition to                            undergo the  procedure.                           After obtaining informed consent, the colonoscope                            was passed under direct vision. Throughout the                            procedure, the patient's blood pressure, pulse, and                            oxygen saturations were monitored continuously. The                            PCF-HQ190L ([id number]) Olympus colonoscope was                            introduced through the anus and advanced to the the                            cecum, identified by appendiceal orifice and                            ileocecal valve. The colonoscopy was performed                            without difficulty. The patient tolerated the                            procedure well. The quality of the bowel                            preparation was good. The ileocecal valve,                            appendiceal orifice, and rectum were photographed. Scope In: 8:07:00 AM Scope Out: 8:34:43 AM Scope Withdrawal Time: 0 hours 15 minutes 14 seconds  Total Procedure Duration: 0 hours 27  minutes 43 seconds  Findings:      The perianal and digital rectal examinations were normal.      A 7 to 8 mm polyp was found in the ascending colon. The polyp was       sessile. The polyp was removed with a cold snare. Resection and       retrieval were complete.      Many small-mouthed diverticula were found in the left colon. This was       associated with restricted mobility of the left colon which took some       time to traverse, prolonging cecal intubation.      The exam was otherwise without abnormality. Impression:               - One 7 to 8 mm polyp in the ascending colon,                            removed with a cold snare. Resected and retrieved.                           - Diverticulosis in the left colon with significant                            restricted mobility of the left colon.                           - The examination was otherwise  normal. Moderate Sedation:      No moderate sedation, case performed with MAC Recommendation:           - Patient has a contact number available for                            emergencies. The signs and symptoms of potential                            delayed complications were discussed with the                            patient. Return to normal activities tomorrow.                            Written discharge instructions were provided to the                            patient.                           - Resume previous diet.                           - Continue present medications.                           - Await pathology results. Likely no further                            surveillance colonoscopy will be  recommended (I                            don't think the patient would be due for another 7                            years after this exam given one polyp on this exam,                            and given significant restricted mobility of the                            left colon she is higher than average risk for                            further procedures) Procedure Code(s):        --- Professional ---                           [phone], Colonoscopy, flexible; with removal of                            tumor(s), polyp(s), or other lesion(s) by snare                            technique Diagnosis Code(s):        --- Professional ---                           Z86.010, Personal history of colonic polyps                           K63.5, Polyp of colon                           K57.30, Diverticulosis of large intestine without                            perforation or abscess without bleeding CPT copyright 2019 [employer]. All rights reserved. The codes documented in this report are preliminary and upon coder review may  be revised to meet current compliance requirements. Remo Lipps P. Schon Zeiders, MD 05/02/2022 8:41:55 AM This report has been signed  electronically. Number of Addenda: 0

## 2022-05-02 NOTE — Anesthesia Postprocedure Evaluation (Signed)
Anesthesia Post Note  Patient: Beatrix Breece  Procedure(s) Performed: COLONOSCOPY WITH PROPOFOL POLYPECTOMY     Patient location during evaluation: PACU Anesthesia Type: MAC Level of consciousness: awake and alert Pain management: pain level controlled Vital Signs Assessment: post-procedure vital signs reviewed and stable Respiratory status: spontaneous breathing, nonlabored ventilation, respiratory function stable and patient connected to nasal cannula oxygen Cardiovascular status: stable and blood pressure returned to baseline Postop Assessment: no apparent nausea or vomiting Anesthetic complications: no   No notable events documented.  Last Vitals:  Vitals:   05/02/22 0857 05/02/22 0900  BP: (!) 98/38   Pulse: 61 62  Resp: (!) 23 17  Temp:    SpO2: 98% 97%    Last Pain:  Vitals:   05/02/22 0723  TempSrc: Temporal  PainSc: 0-No pain                 Effie Berkshire

## 2022-05-02 NOTE — Transfer of Care (Signed)
Immediate Anesthesia Transfer of Care Note  Patient: Hailey Wright  Procedure(s) Performed: COLONOSCOPY WITH PROPOFOL POLYPECTOMY  Patient Location: PACU and Endoscopy Unit  Anesthesia Type:MAC  Level of Consciousness: awake and drowsy  Airway & Oxygen Therapy: Patient Spontanous Breathing and Patient connected to face mask oxygen  Post-op Assessment: Report given to RN and Post -op Vital signs reviewed and stable  Post vital signs: Reviewed and stable  Last Vitals:  Vitals Value Taken Time  BP 89/33 05/02/22 0840  Temp    Pulse 57 05/02/22 0842  Resp 23 05/02/22 0842  SpO2 100 % 05/02/22 0842  Vitals shown include unvalidated device data.  Last Pain:  Vitals:   05/02/22 0723  TempSrc: Temporal  PainSc: 0-No pain         Complications: No notable events documented.

## 2022-05-03 LAB — SURGICAL PATHOLOGY

## 2022-05-10 ENCOUNTER — Encounter: Payer: Self-pay | Admitting: Gastroenterology

## 2022-05-16 ENCOUNTER — Ambulatory Visit (INDEPENDENT_AMBULATORY_CARE_PROVIDER_SITE_OTHER): Payer: Medicare Other

## 2022-05-16 DIAGNOSIS — Z95810 Presence of automatic (implantable) cardiac defibrillator: Secondary | ICD-10-CM

## 2022-05-16 DIAGNOSIS — I5042 Chronic combined systolic (congestive) and diastolic (congestive) heart failure: Secondary | ICD-10-CM | POA: Diagnosis not present

## 2022-05-18 NOTE — Progress Notes (Signed)
EPIC Encounter for ICM Monitoring  Patient Name: Hailey Wright is a 76 y.o. female Date: 05/18/2022 Primary Care Physican: Donnajean Lopes, MD Primary Cardiologist: Hochrein Electrophysiologist: Curt Bears 05/18/2022 Weight:  142 lbs   Spoke with patient and heart failure questions reviewed.  Pt asymptomatic for fluid accumulation.   She was on vacation to [place] during decreased impedance.  She will be having cataract surgery.    Corvue thoracic impedance normal but was suggesting possible fluid accumulation from 5/27-6/7 followed by possible dryness from 6/11-6/23.   Prescribed:  Furosemide 40 mg 1 tablet daily as needed.  She takes 0.5 tablet as needed since 40 mg tends to make her legs cramp.  Jardiance 10 mg take 1 tablet by mouth daily before breakfast.   Labs: 10/11/2021 Creatinine 0.98, BUN 12, Potassium 3.9, Sodium 139, GFR 60 09/22/2021 Creatinine 0.96, BUN 12, Potassium 4.1, Sodium 140, GFR 62 12/10/2020 Creatinine 1.1,   BUN 17, Potassium 5.0, Sodium 139, GFR 59.1 A complete set of results can be found in Results Review.   Recommendations:  No changes and encouraged to call if experiencing any fluid symptoms.   Follow-up plan: ICM clinic phone appointment on 06/20/2022.   91 day device clinic remote transmission 06/06/2022.     EP/Cardiology Office Visit: 08/05/2022 with Dr. Percival Spanish.     Copy of ICM check sent to Dr. Curt Bears.   3 month ICM trend: 05/16/2022.    12-14 Month ICM trend:     [name], RN 05/18/2022 1:01 PM

## 2022-06-06 ENCOUNTER — Ambulatory Visit (INDEPENDENT_AMBULATORY_CARE_PROVIDER_SITE_OTHER): Payer: Medicare Other

## 2022-06-06 DIAGNOSIS — I255 Ischemic cardiomyopathy: Secondary | ICD-10-CM | POA: Diagnosis not present

## 2022-06-06 DIAGNOSIS — I5042 Chronic combined systolic (congestive) and diastolic (congestive) heart failure: Secondary | ICD-10-CM

## 2022-06-07 LAB — CUP PACEART REMOTE DEVICE CHECK
Battery Remaining Longevity: 40 mo
Battery Remaining Percentage: 41 %
Battery Voltage: 2.89 V
Brady Statistic AP VP Percent: 1 %
Brady Statistic AP VS Percent: 1 %
Brady Statistic AS VP Percent: 1 %
Brady Statistic AS VS Percent: 99 %
Brady Statistic RA Percent Paced: 1 %
Brady Statistic RV Percent Paced: 1 %
Date Time Interrogation Session: 20230717020015
HighPow Impedance: 77 Ohm
HighPow Impedance: 77 Ohm
Implantable Lead Implant Date: 20170727
Implantable Lead Implant Date: 20170727
Implantable Lead Location: 753859
Implantable Lead Location: 753860
Implantable Pulse Generator Implant Date: 20170727
Lead Channel Impedance Value: 380 Ohm
Lead Channel Impedance Value: 400 Ohm
Lead Channel Pacing Threshold Amplitude: 0.5 V
Lead Channel Pacing Threshold Amplitude: 0.75 V
Lead Channel Pacing Threshold Pulse Width: 0.5 ms
Lead Channel Pacing Threshold Pulse Width: 0.5 ms
Lead Channel Sensing Intrinsic Amplitude: 12 mV
Lead Channel Sensing Intrinsic Amplitude: 3.4 mV
Lead Channel Setting Pacing Amplitude: 2 V
Lead Channel Setting Pacing Amplitude: 2.5 V
Lead Channel Setting Pacing Pulse Width: 0.5 ms
Lead Channel Setting Sensing Sensitivity: 0.5 mV
Pulse Gen Serial Number: 7261095

## 2022-06-08 ENCOUNTER — Encounter: Payer: Self-pay | Admitting: Cardiology

## 2022-06-20 ENCOUNTER — Ambulatory Visit (INDEPENDENT_AMBULATORY_CARE_PROVIDER_SITE_OTHER): Payer: Medicare Other

## 2022-06-20 DIAGNOSIS — Z95810 Presence of automatic (implantable) cardiac defibrillator: Secondary | ICD-10-CM | POA: Diagnosis not present

## 2022-06-20 DIAGNOSIS — I5042 Chronic combined systolic (congestive) and diastolic (congestive) heart failure: Secondary | ICD-10-CM

## 2022-06-23 NOTE — Progress Notes (Signed)
EPIC Encounter for ICM Monitoring  Patient Name: Hailey Wright is a 76 y.o. female Date: 06/23/2022 Primary Care Physican: Donnajean Lopes, MD Primary Cardiologist: Hochrein Electrophysiologist: Curt Bears 05/18/2022 Weight:  142 lbs 06/23/2022 Weight: 142 lbs   Spoke with patient and heart failure questions reviewed.  Pt asymptomatic for fluid accumulation.  Reports feeling well at this time and voices no complaints.  She took PRN Furosemide during decreased impedance.  She will have cataract surgery 10/24.     Corvue thoracic impedance normal but was suggesting possible fluid accumulation from 7/14-7/21.   Prescribed:  Furosemide 40 mg 1 tablet daily as needed.  She takes 0.5 tablet as needed since 40 mg tends to make her legs cramp.  Jardiance 10 mg take 1 tablet by mouth daily before breakfast.   Labs: 10/11/2021 Creatinine 0.98, BUN 12, Potassium 3.9, Sodium 139, GFR 60 09/22/2021 Creatinine 0.96, BUN 12, Potassium 4.1, Sodium 140, GFR 62 12/10/2020 Creatinine 1.1,   BUN 17, Potassium 5.0, Sodium 139, GFR 59.1 A complete set of results can be found in Results Review.   Recommendations:  No changes and encouraged to call if experiencing any fluid symptoms.   Follow-up plan: ICM clinic phone appointment on 07/26/2022.   91 day device clinic remote transmission 09/05/2022.     EP/Cardiology Office Visit: 08/05/2022 with Dr. Percival Spanish.     Copy of ICM check sent to Dr. Curt Bears.  3 month ICM trend: 06/20/2022.    12-14 Month ICM trend:     [name], RN 06/23/2022 9:58 AM

## 2022-07-08 NOTE — Progress Notes (Signed)
Remote ICD transmission.   

## 2022-07-26 ENCOUNTER — Ambulatory Visit (INDEPENDENT_AMBULATORY_CARE_PROVIDER_SITE_OTHER): Payer: Medicare Other

## 2022-07-26 DIAGNOSIS — Z95810 Presence of automatic (implantable) cardiac defibrillator: Secondary | ICD-10-CM

## 2022-07-26 DIAGNOSIS — I5042 Chronic combined systolic (congestive) and diastolic (congestive) heart failure: Secondary | ICD-10-CM

## 2022-07-28 NOTE — Progress Notes (Signed)
EPIC Encounter for ICM Monitoring  Patient Name: Hailey Wright is a 76 y.o. female Date: 07/28/2022 Primary Care Physican: Donnajean Lopes, MD Primary Cardiologist: Hochrein Electrophysiologist: Curt Bears 05/18/2022 Weight:  142 lbs 06/23/2022 Weight: 142 lbs 07/28/2022 Weight: 142 lbs   Spoke with patient and heart failure questions reviewed.  Pt asymptomatic for fluid accumulation.  She had a 2 lb weight gain, dry cough and little swelling in her hand over the last few days.  She has taken PRN Furosemide over the past several days with relief of symptoms.  Scheduled for cataract surgery 10/4.    Corvue thoracic impedance normal but was suggesting possible fluid accumulation from 8/9-8/14 and 8/31-9/2.   Prescribed:  Furosemide 40 mg 1 tablet daily as needed.  She takes 0.5 tablet as needed since 40 mg tends to make her legs cramp.  Jardiance 10 mg take 1 tablet by mouth daily before breakfast.   Labs: 10/11/2021 Creatinine 0.98, BUN 12, Potassium 3.9, Sodium 139, GFR 60 09/22/2021 Creatinine 0.96, BUN 12, Potassium 4.1, Sodium 140, GFR 62 12/10/2020 Creatinine 1.1,   BUN 17, Potassium 5.0, Sodium 139, GFR 59.1 A complete set of results can be found in Results Review.   Recommendations:  No changes and encouraged to call if experiencing any fluid symptoms.   Follow-up plan: ICM clinic phone appointment on 09/06/2022.   91 day device clinic remote transmission 09/05/2022.     EP/Cardiology Office Visit:   08/05/2022 with Dr. Percival Spanish.  09/21/2022 with Dr Curt Bears.   Copy of ICM check sent to Dr. Curt Bears.  3 month ICM trend: 07/26/2022.    12-14 Month ICM trend:     [name], RN 07/28/2022 12:01 PM

## 2022-08-03 DIAGNOSIS — E118 Type 2 diabetes mellitus with unspecified complications: Secondary | ICD-10-CM | POA: Insufficient documentation

## 2022-08-03 NOTE — Progress Notes (Signed)
Cardiology Office Note   Date:  08/05/2022   ID:  [name], DOB [birth year], MRN [id number]  PCP:  Donnajean Lopes, MD  Cardiologist:   Minus Breeding, MD  Chief Complaint  Patient presents with   Cardiomyopathy      History of Present Illness: Hailey Wright is a 76 y.o. female who presents for follow of CAD and ischemic cardiomyopathy.  She has a history of known coronary disease as previously described. She had a negative stress perfusion study in the fall of 2016.  However, she came back with symptoms of acute onset chest pain. The plan was an elective cardiac catheterization. However, prior to that presented with an acute anterior myocardial infarction. She had occlusion of the LAD with acute thrombus in the circumflex. She did have chronic occlusion of her right coronary artery. She was placed on intra-aortic balloon pump and had emergent CABG with a LIMA to the LAD and SVG to OM1. She is left with an ejection fraction of approximately 30%. She had an ICD placed.   Her EF improved on echo in July 2018 to 35%. EF was 35 - 40% in July of last year.  I have been titrating meds.    She had chest pain last year. LHC on 09/30/2021 showed severe native CAD with subtotal occlusion of the left main , total occlusion of the ostial LAD, and total occlusion of the LCX as well as CTO of the RCA with proximal occlusion and bridging collaterals. Prior grafts were widely patents. Therefore, continued medical management was recommended. She was continued on DAPT with Aspirin and Plavix and Imdur was increased.   Since I saw her she has done well.  She went up to [place] and was on a schooner.  The patient denies any new symptoms such as chest discomfort, neck or arm discomfort. There has been no new shortness of breath, PND or orthopnea. There have been no reported palpitations, presyncope or syncope.   Past Medical History:  Diagnosis Date   Acute ST elevation myocardial infarction (STEMI)  involving left anterior descending coronary artery (HCC) 02/18/2016   AICD (automatic cardioverter/defibrillator) present    CAD (coronary artery disease) 06/04/2013   CHF (congestive heart failure) (HCC)    Clotting disorder (HCC)    COPD (chronic obstructive pulmonary disease) (HCC)    Coronary artery thrombosis ([place]) 02/18/2016   DVT (deep venous thrombosis) ([place]) 1980s   RLE   Hepatitis A infection 1987   Hyperlipidemia    Hypertension    Ischemic cardiomyopathy    a. Echo 5/17: EF 25-30%, anteroseptal, anterior, anteroseptal and apical akinesis, possible inferior hypokinesis, grade 1 diastolic dysfunction, no evidence of thrombus, trivial AI, mild MR, mild LAE   MI (myocardial infarction) ([place]) 1991   Inferior treated with TPA 1991, POBA RCA 1996   Pneumonia 1980s X 1   RAS (renal artery stenosis) ([place])    Right renal stent 2005   S/P Emergency CABG x 2 02/18/2016   LIMA to LAD, SVG to OM1, EVH via right thigh   Type II diabetes mellitus ([place])    pt denies    Past Surgical History:  Procedure Laterality Date   CARDIAC CATHETERIZATION N/A 02/18/2016   Procedure: Left Heart Cath and Coronary Angiography;  Surgeon: Leonie Man, MD;  Location: [facility];  Service: Cardiovascular;  Laterality: N/A;   COLONOSCOPY WITH PROPOFOL N/A 05/02/2022   Procedure: COLONOSCOPY WITH PROPOFOL;  Surgeon: Yetta Flock, MD;  Location: [facility];  Service: Gastroenterology;  Laterality: N/A;   CORONARY ANGIOPLASTY  1991   CORONARY ARTERY BYPASS GRAFT N/A 02/18/2016   Procedure: CORONARY ARTERY BYPASS GRAFTING (CABG) times 2 using left internal mammary and right greater saphenous vein;  Surgeon: Rexene Alberts, MD;  Location: [facility];  Service: Open Heart Surgery;  Laterality: N/A;   EP IMPLANTABLE DEVICE N/A 06/16/2016   Procedure: ICD Implant;  Surgeon: Will Meredith Leeds, MD;  Location: [facility];  Service: Cardiovascular;  Laterality: N/A;   LEFT HEART CATH AND  CORS/GRAFTS ANGIOGRAPHY N/A 09/30/2021   Procedure: LEFT HEART CATH AND CORS/GRAFTS ANGIOGRAPHY;  Surgeon: Shelva Majestic A;  Location: [facility];  Service: Cardiovascular;  Laterality: N/A;   POLYPECTOMY  05/02/2022   Procedure: POLYPECTOMY;  Surgeon: Yetta Flock, MD;  Location: [facility];  Service: Gastroenterology;;   RENAL ARTERY STENT Right 2005   RHINOPLASTY Bilateral [place] Right 2019   Dr Onnie Graham   TONSILLECTOMY AND ADENOIDECTOMY  1950s     Current Outpatient Medications  Medication Sig Dispense Refill   albuterol (VENTOLIN HFA) 108 (90 Base) MCG/ACT inhaler Inhale 2 puffs into the lungs every 6 (six) hours as needed for wheezing or shortness of breath.     aspirin 81 MG tablet Take 81 mg by mouth daily.     calcium-vitamin D (OSCAL WITH D) 500-200 MG-UNIT per tablet Take 1 tablet by mouth 2 (two) times daily.     carboxymethylcellulose (REFRESH PLUS) 0.5 % SOLN Place 1 drop into both eyes 4 (four) times daily.     Co-Enzyme Q10 200 MG CAPS Take 200 mg by mouth at bedtime.     empagliflozin (JARDIANCE) 10 MG TABS tablet Take 1 tablet (10 mg total) by mouth daily before breakfast. 90 tablet 3   ENTRESTO 24-26 MG TAKE 1 TABLET TWICE A DAY 180 tablet 3   ezetimibe (ZETIA) 10 MG tablet Take 10 mg by mouth at bedtime.     furosemide (LASIX) 40 MG tablet TAKE 1 TABLET BY MOUTH AS NEEDED (Patient taking differently: Take 20 mg by mouth daily as needed for edema.) 30 tablet 3   isosorbide mononitrate (IMDUR) 120 MG 24 hr tablet Take 1 tablet (120 mg total) by mouth daily. 90 tablet 1   Magnesium Oxide 420 MG TABS Take 420 mg by mouth daily.     metoprolol succinate (TOPROL-XL) 25 MG 24 hr tablet TAKE 1 TABLET DAILY (OVERDUE FOR OFFICE VISIT. CALL FOR APPOINTMENT/FUTURE REFILLS) (Patient taking differently: Take 25 mg by mouth daily as needed (SBP >100).) 90 tablet 3   Multiple Vitamin (MULTIVITAMIN) tablet Take 1 tablet by mouth daily.     NITROSTAT 0.4  MG SL tablet Place 1 tablet (0.4 mg total) under the tongue every 5 (five) minutes as needed for chest pain. 25 tablet 1   PEG-KCl-NaCl-NaSulf-Na Asc-C (PLENVU) 140 g SOLR Take 1 kit by mouth as directed. 1 each 0   rosuvastatin (CRESTOR) 40 MG tablet Take 1 tablet (40 mg total) by mouth daily. 90 tablet 1   umeclidinium-vilanterol (ANORO ELLIPTA) 62.5-25 MCG/INH AEPB Inhale 1 puff into the lungs at bedtime.     No current facility-administered medications for this visit.    Allergies:   Patient has no known allergies.    ROS:  Please see the history of present illness.   Otherwise, review of systems are positive for none.   All other systems are reviewed and negative.  PHYSICAL EXAM: VS:  BP 100/60   Pulse 82   Ht 5' 6"  (1.676 m)   Wt 143 lb 3.2 oz (65 kg)   LMP  (LMP Unknown)   SpO2 98%   BMI 23.11 kg/m  , BMI Body mass index is 23.11 kg/m. GENERAL:  Well appearing NECK:  No jugular venous distention, waveform within normal limits, carotid upstroke brisk and symmetric, no bruits, no thyromegaly LUNGS:  Clear to auscultation bilaterally CHEST: Well-healed sternotomy scar and ICD pocket HEART:  PMI not displaced or sustained,S1 and S2 within normal limits, no S3, no S4, no clicks, no rubs, 2 out of 6 left upper sternal border systolic murmur, no diastolic murmurs ABD:  Flat, positive bowel sounds normal in frequency in pitch, no bruits, no rebound, no guarding, no midline pulsatile mass, no hepatomegaly, no splenomegaly EXT:  2 plus pulses throughout, no edema, no cyanosis no clubbing  EKG:  EKG is not ordered today.   Recent Labs: 09/22/2021: BUN 12; Creatinine, Ser 0.96; Hemoglobin 13.5; Platelets 182; Potassium 4.1; Sodium 140    Lipid Panel No results found for: "CHOL", "TRIG", "HDL", "CHOLHDL", "VLDL", "LDLCALC", "LDLDIRECT"    Wt Readings from Last 3 Encounters:  08/05/22 143 lb 3.2 oz (65 kg)  05/02/22 142 lb (64.4 kg)  04/01/22 145 lb 2 oz (65.8 kg)       Other studies Reviewed: Additional studies/ records that were reviewed today include: Labs Review of the above records demonstrates:  Please see elsewhere in the note.     ASSESSMENT AND PLAN:  CAD : She has occasional stable exertional chest discomfort unchanged from her cath in November 2022.  No change in therapy.    Ischemic CM: Her ejection fraction is 30 to 35%.  She is on optimal medical therapy and cannot tolerate further med titration because of her blood pressure.  She is up-to-date with blood work.  HL: LDL was 35 with an HDL of 98.  No change in therapy.    ICD: She is up-to-date with follow-up on Sept 2023.  There was some evidence briefly of volume overload.  She took a half a Lasix which she does periodically.  She feels like she is breathing well.   Current medicines are reviewed at length with the patient today.  The patient does not have concerns regarding medicines.  The following changes have been made:  None  Labs/ tests ordered today include: None No orders of the defined types were placed in this encounter.   Disposition:   FU with me in 6 months.      Signed, Minus Breeding, MD  08/05/2022 8:25 AM    [facility]

## 2022-08-05 ENCOUNTER — Ambulatory Visit: Payer: Medicare Other | Attending: Cardiology | Admitting: Cardiology

## 2022-08-05 ENCOUNTER — Encounter: Payer: Self-pay | Admitting: Cardiology

## 2022-08-05 VITALS — BP 100/60 | HR 82 | Ht 66.0 in | Wt 143.2 lb

## 2022-08-05 DIAGNOSIS — I2510 Atherosclerotic heart disease of native coronary artery without angina pectoris: Secondary | ICD-10-CM

## 2022-08-05 DIAGNOSIS — Z95810 Presence of automatic (implantable) cardiac defibrillator: Secondary | ICD-10-CM | POA: Diagnosis not present

## 2022-08-05 DIAGNOSIS — I255 Ischemic cardiomyopathy: Secondary | ICD-10-CM

## 2022-08-05 DIAGNOSIS — E118 Type 2 diabetes mellitus with unspecified complications: Secondary | ICD-10-CM

## 2022-08-05 NOTE — Patient Instructions (Signed)
Medication Instructions:  Your physician recommends that you continue on your current medications as directed. Please refer to the Current Medication list given to you today.  *If you need a refill on your cardiac medications before your next appointment, please call your pharmacy*   Lab Work: None   Testing/Procedures: None   Follow-Up: At [facility], you and your health needs are our priority.  As part of our continuing mission to provide you with exceptional heart care, we have created designated Provider Care Teams.  These Care Teams include your primary Cardiologist (physician) and Advanced Practice Providers (APPs -  Physician Assistants and Nurse Practitioners) who all work together to provide you with the care you need, when you need it.  We recommend signing up for the patient portal called "MyChart".  Sign up information is provided on this After Visit Summary.  MyChart is used to connect with patients for Virtual Visits (Telemedicine).  Patients are able to view lab/test results, encounter notes, upcoming appointments, etc.  Non-urgent messages can be sent to your provider as well.   To learn more about what you can do with MyChart, go to [email or web address].    Your next appointment:   6 month(s)  The format for your next appointment:   In Person  Provider:   Minus Breeding, MD     Other Instructions   Important Information About Sugar

## 2022-09-05 ENCOUNTER — Ambulatory Visit (INDEPENDENT_AMBULATORY_CARE_PROVIDER_SITE_OTHER): Payer: Medicare Other

## 2022-09-05 DIAGNOSIS — I5042 Chronic combined systolic (congestive) and diastolic (congestive) heart failure: Secondary | ICD-10-CM | POA: Diagnosis not present

## 2022-09-06 ENCOUNTER — Ambulatory Visit (INDEPENDENT_AMBULATORY_CARE_PROVIDER_SITE_OTHER): Payer: Medicare Other

## 2022-09-06 ENCOUNTER — Telehealth: Payer: Self-pay

## 2022-09-06 DIAGNOSIS — I5042 Chronic combined systolic (congestive) and diastolic (congestive) heart failure: Secondary | ICD-10-CM | POA: Diagnosis not present

## 2022-09-06 DIAGNOSIS — J439 Emphysema, unspecified: Secondary | ICD-10-CM | POA: Diagnosis not present

## 2022-09-06 DIAGNOSIS — Z95810 Presence of automatic (implantable) cardiac defibrillator: Secondary | ICD-10-CM | POA: Diagnosis not present

## 2022-09-06 DIAGNOSIS — R051 Acute cough: Secondary | ICD-10-CM | POA: Diagnosis not present

## 2022-09-06 LAB — CUP PACEART REMOTE DEVICE CHECK
Battery Remaining Longevity: 38 mo
Battery Remaining Percentage: 39 %
Battery Voltage: 2.89 V
Brady Statistic AP VP Percent: 1 %
Brady Statistic AP VS Percent: 1 %
Brady Statistic AS VP Percent: 1 %
Brady Statistic AS VS Percent: 99 %
Brady Statistic RA Percent Paced: 1 %
Brady Statistic RV Percent Paced: 1 %
Date Time Interrogation Session: 20231016020014
HighPow Impedance: 70 Ohm
HighPow Impedance: 70 Ohm
Implantable Lead Implant Date: 20170727
Implantable Lead Implant Date: 20170727
Implantable Lead Location: 753859
Implantable Lead Location: 753860
Implantable Pulse Generator Implant Date: 20170727
Lead Channel Impedance Value: 400 Ohm
Lead Channel Impedance Value: 410 Ohm
Lead Channel Pacing Threshold Amplitude: 0.5 V
Lead Channel Pacing Threshold Amplitude: 0.75 V
Lead Channel Pacing Threshold Pulse Width: 0.5 ms
Lead Channel Pacing Threshold Pulse Width: 0.5 ms
Lead Channel Sensing Intrinsic Amplitude: 12 mV
Lead Channel Sensing Intrinsic Amplitude: 4.1 mV
Lead Channel Setting Pacing Amplitude: 2 V
Lead Channel Setting Pacing Amplitude: 2.5 V
Lead Channel Setting Pacing Pulse Width: 0.5 ms
Lead Channel Setting Sensing Sensitivity: 0.5 mV
Pulse Gen Serial Number: 7261095

## 2022-09-06 NOTE — Progress Notes (Signed)
EPIC Encounter for ICM Monitoring  Patient Name: Hailey Wright is a 76 y.o. female Date: 09/06/2022 Primary Care Physican: Donnajean Lopes, MD Primary Cardiologist: Hochrein Electrophysiologist: Curt Bears 05/18/2022 Weight:  142 lbs 06/23/2022 Weight: 142 lbs 07/28/2022 Weight: 142 lbs   Attempted call to patient and unable to reach.  Left detailed message per DPR regarding transmission. Transmission reviewed.    Corvue thoracic impedance suggesting possible fluid accumulation from 9/15-10/3 followed by possible dryness starting 10/4 but returned to baseline 10/14.   Prescribed:  Furosemide 40 mg 1 tablet daily as needed.  She takes 0.5 tablet as needed since 40 mg tends to make her legs cramp.  Jardiance 10 mg take 1 tablet by mouth daily before breakfast.   Labs: 10/11/2021 Creatinine 0.98, BUN 12, Potassium 3.9, Sodium 139, GFR 60 09/22/2021 Creatinine 0.96, BUN 12, Potassium 4.1, Sodium 140, GFR 62 12/10/2020 Creatinine 1.1,   BUN 17, Potassium 5.0, Sodium 139, GFR 59.1 A complete set of results can be found in Results Review.   Recommendations: Left voice mail with ICM number and encouraged to call if experiencing any fluid symptoms.   Follow-up plan: ICM clinic phone appointment on 09/12/2022 (manual) to recheck fluid levels.   91 day device clinic remote transmission 12/05/2022.     EP/Cardiology Office Visit:   02/02/2023 with Dr. Percival Spanish.  09/21/2022 with Dr Curt Bears.   Copy of ICM check sent to Dr. Curt Bears.    3 month ICM trend: 09/06/2022.    12-14 Month ICM trend:     [name], RN 09/06/2022 7:51 AM

## 2022-09-06 NOTE — Telephone Encounter (Signed)
Remote ICM transmission received.  Attempted call to patient regarding ICM remote transmission and left detailed message per DPR.  Advised to return call for any fluid symptoms or questions. Next ICM remote transmission scheduled 09/12/2022.    

## 2022-09-13 NOTE — Progress Notes (Signed)
No ICM remote transmission received for 09/12/2022 and next ICM transmission scheduled for 10/10/2022.

## 2022-09-21 ENCOUNTER — Ambulatory Visit: Payer: Medicare Other | Attending: Cardiology | Admitting: Cardiology

## 2022-09-21 ENCOUNTER — Encounter: Payer: Self-pay | Admitting: Cardiology

## 2022-09-21 VITALS — BP 108/62 | HR 68 | Ht 66.0 in | Wt 144.0 lb

## 2022-09-21 DIAGNOSIS — I5022 Chronic systolic (congestive) heart failure: Secondary | ICD-10-CM | POA: Insufficient documentation

## 2022-09-21 DIAGNOSIS — I255 Ischemic cardiomyopathy: Secondary | ICD-10-CM | POA: Insufficient documentation

## 2022-09-21 LAB — CUP PACEART INCLINIC DEVICE CHECK
Battery Remaining Longevity: 40 mo
Brady Statistic RA Percent Paced: 0.52 %
Brady Statistic RV Percent Paced: 0 %
Date Time Interrogation Session: 20231101090627
HighPow Impedance: 79.875
Implantable Lead Connection Status: 753985
Implantable Lead Connection Status: 753985
Implantable Lead Implant Date: 20170727
Implantable Lead Implant Date: 20170727
Implantable Lead Location: 753859
Implantable Lead Location: 753860
Implantable Pulse Generator Implant Date: 20170727
Lead Channel Impedance Value: 425 Ohm
Lead Channel Impedance Value: 437.5 Ohm
Lead Channel Pacing Threshold Amplitude: 0.5 V
Lead Channel Pacing Threshold Amplitude: 0.5 V
Lead Channel Pacing Threshold Amplitude: 1.25 V
Lead Channel Pacing Threshold Amplitude: 1.25 V
Lead Channel Pacing Threshold Pulse Width: 0.5 ms
Lead Channel Pacing Threshold Pulse Width: 0.5 ms
Lead Channel Pacing Threshold Pulse Width: 0.5 ms
Lead Channel Pacing Threshold Pulse Width: 0.5 ms
Lead Channel Sensing Intrinsic Amplitude: 12 mV
Lead Channel Sensing Intrinsic Amplitude: 4.1 mV
Lead Channel Setting Pacing Amplitude: 2 V
Lead Channel Setting Pacing Amplitude: 2.5 V
Lead Channel Setting Pacing Pulse Width: 0.5 ms
Lead Channel Setting Sensing Sensitivity: 0.5 mV
Pulse Gen Serial Number: 7261095
Zone Setting Status: 755011

## 2022-09-21 NOTE — Progress Notes (Signed)
Electrophysiology Office Note   Date:  09/21/2022   ID:  Hailey Wright, DOB [birth year], MRN [id number]  PCP:  Hailey Lopes, MD  Cardiologist:  Hailey Wright Primary Electrophysiologist:  Chi Woodham Hailey Leeds, MD    No chief complaint on file.     History of Present Illness: Hailey Wright is Wright 76 y.o. female who presents today for electrophysiology evaluation.     She has Wright history seen for coronary artery disease with ischemic cardiomyopathy.  In March 2017 she had emergent CABG with LIMA to the LAD and saphenous vein to the OM1.  She had an ejection fraction of 30%.  She is status post [place] dual-chamber ICD implanted 06/17/2016.  Since her ICD was implanted she has developed left bundle branch block.  Today, denies symptoms of palpitations, chest pain, shortness of breath, orthopnea, PND, lower extremity edema, claudication, dizziness, presyncope, syncope, bleeding, or neurologic sequela. The patient is tolerating medications without difficulties. She has done well.  She continues to exercise, going to the gym multiple times Wright week.  She is overall quite happy with how she has been feeling.    Past Medical History:  Diagnosis Date   Acute ST elevation myocardial infarction (STEMI) involving left anterior descending coronary artery (HCC) 02/18/2016   AICD (automatic cardioverter/defibrillator) present    CAD (coronary artery disease) 06/04/2013   CHF (congestive heart failure) (HCC)    Clotting disorder (HCC)    COPD (chronic obstructive pulmonary disease) (HCC)    Coronary artery thrombosis ([place]) 02/18/2016   DVT (deep venous thrombosis) ([place]) 1980s   RLE   Hepatitis Wright infection 1987   Hyperlipidemia    Hypertension    Ischemic cardiomyopathy    Wright. Echo 5/17: EF 25-30%, anteroseptal, anterior, anteroseptal and apical akinesis, possible inferior hypokinesis, grade 1 diastolic dysfunction, no evidence of thrombus, trivial AI, mild MR, mild LAE   MI (myocardial infarction)  ([place]) 1991   Inferior treated with TPA 1991, POBA RCA 1996   Pneumonia 1980s X 1   RAS (renal artery stenosis) ([place])    Right renal stent 2005   S/P Emergency CABG x 2 02/18/2016   LIMA to LAD, SVG to OM1, EVH via right thigh   Type II diabetes mellitus ([place])    pt denies   Past Surgical History:  Procedure Laterality Date   CARDIAC CATHETERIZATION N/Wright 02/18/2016   Procedure: Left Heart Cath and Coronary Angiography;  Surgeon: Hailey Man, MD;  Location: [facility];  Service: Cardiovascular;  Laterality: N/Wright;   COLONOSCOPY WITH PROPOFOL N/Wright 05/02/2022   Procedure: COLONOSCOPY WITH PROPOFOL;  Surgeon: Hailey Flock, MD;  Location: [facility];  Service: Gastroenterology;  Laterality: N/Wright;   CORONARY ANGIOPLASTY  1991   CORONARY ARTERY BYPASS GRAFT N/Wright 02/18/2016   Procedure: CORONARY ARTERY BYPASS GRAFTING (CABG) times 2 using left internal mammary and right greater saphenous vein;  Surgeon: Hailey Alberts, MD;  Location: [facility];  Service: Open Heart Surgery;  Laterality: N/Wright;   EP IMPLANTABLE DEVICE N/Wright 06/16/2016   Procedure: ICD Implant;  Surgeon: Hailey Edelson Hailey Leeds, MD;  Location: [facility];  Service: Cardiovascular;  Laterality: N/Wright;   LEFT HEART CATH AND CORS/GRAFTS ANGIOGRAPHY N/Wright 09/30/2021   Procedure: LEFT HEART CATH AND CORS/GRAFTS ANGIOGRAPHY;  Surgeon: Hailey Wright;  Location: [facility];  Service: Cardiovascular;  Laterality: N/Wright;   POLYPECTOMY  05/02/2022   Procedure: POLYPECTOMY;  Surgeon: Hailey Flock, MD;  Location: [facility];  Service: Gastroenterology;;   RENAL ARTERY STENT Right 2005   RHINOPLASTY Bilateral 1968   SHOULDER SURGERY Right 2019   Dr Hailey Wright   TONSILLECTOMY AND ADENOIDECTOMY  1950s     Current Outpatient Medications  Medication Sig Dispense Refill   albuterol (VENTOLIN HFA) 108 (90 Base) MCG/ACT inhaler Inhale 2 puffs into the lungs every 6 (six) hours as needed for wheezing or shortness of breath.      aspirin 81 MG tablet Take 81 mg by mouth daily.     calcium-vitamin D (OSCAL WITH D) 500-200 MG-UNIT per tablet Take 1 tablet by mouth 2 (two) times daily.     carboxymethylcellulose (REFRESH PLUS) 0.5 % SOLN Place 1 drop into both eyes 4 (four) times daily.     Co-Enzyme Q10 200 MG CAPS Take 200 mg by mouth at bedtime.     empagliflozin (JARDIANCE) 10 MG TABS tablet Take 1 tablet (10 mg total) by mouth daily before breakfast. 90 tablet 3   ENTRESTO 24-26 MG TAKE 1 TABLET TWICE Wright DAY 180 tablet 3   ezetimibe (ZETIA) 10 MG tablet Take 10 mg by mouth at bedtime.     furosemide (LASIX) 40 MG tablet TAKE 1 TABLET BY MOUTH AS NEEDED (Patient taking differently: Take 20 mg by mouth daily as needed for edema.) 30 tablet 3   isosorbide mononitrate (IMDUR) 120 MG 24 hr tablet Take 1 tablet (120 mg total) by mouth daily. 90 tablet 1   Magnesium Oxide 420 MG TABS Take 420 mg by mouth daily.     metoprolol succinate (TOPROL-XL) 25 MG 24 hr tablet TAKE 1 TABLET DAILY (OVERDUE FOR OFFICE VISIT. CALL FOR APPOINTMENT/FUTURE REFILLS) (Patient taking differently: Take 25 mg by mouth daily as needed (SBP >100).) 90 tablet 3   Multiple Vitamin (MULTIVITAMIN) tablet Take 1 tablet by mouth daily.     NITROSTAT 0.4 MG SL tablet Place 1 tablet (0.4 mg total) under the tongue every 5 (five) minutes as needed for chest pain. 25 tablet 1   rosuvastatin (CRESTOR) 40 MG tablet Take 1 tablet (40 mg total) by mouth daily. 90 tablet 1   umeclidinium-vilanterol (ANORO ELLIPTA) 62.5-25 MCG/INH AEPB Inhale 1 puff into the lungs at bedtime.     No current facility-administered medications for this visit.    Allergies:   Patient has no known allergies.   Social History:  The patient  reports that she quit smoking about 11 years ago. Her smoking use included cigarettes. She started smoking about 55 years ago. She has Wright 40.00 pack-year smoking history. She has never used smokeless tobacco. She reports that she does not drink alcohol  and does not use drugs.   Family History:  The patient's family history includes Cancer in her mother; Heart attack (age of onset: 57) in her brother; Heart attack (age of onset: 12) in her father.   ROS:  Please see the history of present illness.   Otherwise, review of systems is positive for none.   All other systems are reviewed and negative.   PHYSICAL EXAM: VS:  BP 108/62   Pulse 68   Ht '5\' 6"'$  (1.676 m)   Wt 144 lb (65.3 kg)   LMP  (LMP Unknown)   SpO2 97%   BMI 23.24 kg/m  , BMI Body mass index is 23.24 kg/m. GEN: Well nourished, well developed, in no acute distress  HEENT: normal  Neck: no JVD, carotid bruits, or masses Cardiac: RRR; no murmurs, rubs, or gallops,no edema  Respiratory:  clear to auscultation bilaterally, normal work of breathing GI: soft, nontender, nondistended, + BS MS: no deformity or atrophy  Skin: warm and dry, device site well healed Neuro:  Strength and sensation are intact Psych: euthymic mood, full affect  EKG:  EKG is ordered today. Personal review of the ekg ordered shows sinus rhythm, left bundle branch block  Personal review of the device interrogation today. Results in [place]: 09/22/2021: BUN 12; Creatinine, Ser 0.96; Hemoglobin 13.5; Platelets 182; Potassium 4.1; Sodium 140    Lipid Panel  No results found for: "CHOL", "TRIG", "HDL", "CHOLHDL", "VLDL", "LDLCALC", "LDLDIRECT"   Wt Readings from Last 3 Encounters:  09/21/22 144 lb (65.3 kg)  08/05/22 143 lb 3.2 oz (65 kg)  05/02/22 142 lb (64.4 kg)      Other studies Reviewed: Additional studies/ records that were reviewed today include:  TTE 05/20/16 - Left ventricle: The cavity size was mildly dilated. Wall   thickness was normal. Systolic function was severely reduced. The   estimated ejection fraction was in the range of 25% to 30%. There   is akinesis of the anteroseptal and apical myocardium. Features   are consistent with Wright pseudonormal left ventricular  filling   pattern, with concomitant abnormal relaxation and increased   filling pressure (grade 2 diastolic dysfunction). - Aortic valve: There was trivial regurgitation. - Mitral valve: Calcified annulus. Mildly thickened leaflets .   There was mild regurgitation. - Left atrium: The atrium was moderately dilated.    ASSESSMENT AND PLAN:  1.  Chronic systolic heart failure: Due to ischemic cardiomyopathy.  Status post [employer] dual-chamber ICD implanted 06/16/2016.  Device functioning appropriately.  No changes at this time.  2.  Coronary artery disease: Status post CABG.  No current chest pain.  Continue with plan per primary cardiology.  3.  Hyperlipidemia: Goal LDL less than 70.  Continue statin per primary cardiology.  4.  Left bundle branch block: Has remained stable.  No acute symptoms of heart failure.  If symptoms do recur, Phillippe Orlick likely need biventricular upgrade.   Current medicines are reviewed at length with the patient today.   The patient does not have concerns regarding her medicines.  The following changes were made today: none  Labs/ tests ordered today include:  Orders Placed This Encounter  Procedures   EKG 12-Lead     Disposition:   FU 12 months  Signed, Zephan Beauchaine Hailey Leeds, MD  09/21/2022 9:00 AM     [facility] [address] [place] [address] [phone] (office) [phone] (fax)

## 2022-09-23 NOTE — Progress Notes (Signed)
Remote ICD transmission.   

## 2022-10-10 ENCOUNTER — Ambulatory Visit (INDEPENDENT_AMBULATORY_CARE_PROVIDER_SITE_OTHER): Payer: Medicare Other

## 2022-10-10 DIAGNOSIS — Z95810 Presence of automatic (implantable) cardiac defibrillator: Secondary | ICD-10-CM | POA: Diagnosis not present

## 2022-10-10 DIAGNOSIS — I5022 Chronic systolic (congestive) heart failure: Secondary | ICD-10-CM

## 2022-10-11 DIAGNOSIS — Z1231 Encounter for screening mammogram for malignant neoplasm of breast: Secondary | ICD-10-CM | POA: Diagnosis not present

## 2022-10-11 NOTE — Progress Notes (Signed)
EPIC Encounter for ICM Monitoring  Patient Name: Hailey Wright is a 76 y.o. female Date: 10/11/2022 Primary Care Physican: Donnajean Lopes, MD Primary Cardiologist: Hochrein Electrophysiologist: Curt Bears 05/18/2022 Weight:  142 lbs 06/23/2022 Weight: 142 lbs 07/28/2022 Weight: 142 lbs   Spoke with patient and heart failure questions reviewed.  Transmission results reviewed.  Pt asymptomatic for fluid accumulation.  Reports feeling well at this time and voices no complaints.     Corvue thoracic impedance suggesting normal fluid levels but intermittent days with possible fluid accumulation.    Prescribed:  Furosemide 40 mg 1 tablet daily as needed.  She takes 0.5 tablet as needed since 40 mg tends to make her legs cramp.  Jardiance 10 mg take 1 tablet by mouth daily before breakfast.   Labs: 10/11/2021 Creatinine 0.98, BUN 12, Potassium 3.9, Sodium 139, GFR 60 09/22/2021 Creatinine 0.96, BUN 12, Potassium 4.1, Sodium 140, GFR 62 12/10/2020 Creatinine 1.1,   BUN 17, Potassium 5.0, Sodium 139, GFR 59.1 A complete set of results can be found in Results Review.   Recommendations:  Recommendation to limit salt intake to 2000 mg daily and fluid intake to 64 oz daily.  Encouraged to call if experiencing any fluid symptoms.    Follow-up plan: ICM clinic phone appointment on 11/15/2022.   91 day device clinic remote transmission 12/05/2022.     EP/Cardiology Office Visit:   02/02/2023 with Dr. Percival Spanish.  Recall 09/16/2023 with Dr Curt Bears.   Copy of ICM check sent to Dr. Curt Bears.    3 month ICM trend: 10/10/2022.    12-14 Month ICM trend:     [name], RN 10/11/2022 7:08 AM

## 2022-11-15 ENCOUNTER — Ambulatory Visit (INDEPENDENT_AMBULATORY_CARE_PROVIDER_SITE_OTHER): Payer: Medicare Other

## 2022-11-15 DIAGNOSIS — Z95810 Presence of automatic (implantable) cardiac defibrillator: Secondary | ICD-10-CM | POA: Diagnosis not present

## 2022-11-15 DIAGNOSIS — I5022 Chronic systolic (congestive) heart failure: Secondary | ICD-10-CM

## 2022-11-16 ENCOUNTER — Telehealth: Payer: Self-pay

## 2022-11-16 DIAGNOSIS — M25512 Pain in left shoulder: Secondary | ICD-10-CM | POA: Diagnosis not present

## 2022-11-16 DIAGNOSIS — M7502 Adhesive capsulitis of left shoulder: Secondary | ICD-10-CM | POA: Diagnosis not present

## 2022-11-16 NOTE — Progress Notes (Signed)
EPIC Encounter for ICM Monitoring  Patient Name: Hailey Wright is a 76 y.o. female Date: 11/16/2022 Primary Care Physican: Donnajean Lopes, MD Primary Cardiologist: Hochrein Electrophysiologist: Curt Bears 05/18/2022 Weight:  142 lbs 06/23/2022 Weight: 142 lbs 07/28/2022 Weight: 142 lbs   Attempted call to patient and unable to reach.  Left detailed message per DPR regarding transmission. Transmission reviewed.      Corvue thoracic impedance suggesting normal fluid levels with exception of possible fluid accumulation from 11/27-12/8   Prescribed:  Furosemide 40 mg 1 tablet daily as needed.  She takes 0.5 tablet as needed since 40 mg tends to make her legs cramp.  Jardiance 10 mg take 1 tablet by mouth daily before breakfast.   Labs: 05/26/2022 Creatinine 1.03, BUN 21, Potassium 4.2, Sodium 138, GFR 57 A complete set of results can be found in Results Review.   Recommendations:  Left voice mail with ICM number and encouraged to call if experiencing any fluid symptoms.   Follow-up plan: ICM clinic phone appointment on 12/19/2022.   91 day device clinic remote transmission 12/05/2022.     EP/Cardiology Office Visit:   02/02/2023 with Dr. Percival Spanish.  Recall 09/16/2023 with Dr Curt Bears.   Copy of ICM check sent to Dr. Curt Bears.     3 month ICM trend: 11/15/2022.    12-14 Month ICM trend:     [name], RN 11/16/2022 8:13 AM

## 2022-11-16 NOTE — Telephone Encounter (Signed)
Remote ICM transmission received.  Attempted call to patient regarding ICM remote transmission and left detailed message per DPR.  Advised to return call for any fluid symptoms or questions. Next ICM remote transmission scheduled 12/19/2022.

## 2022-12-05 ENCOUNTER — Ambulatory Visit (INDEPENDENT_AMBULATORY_CARE_PROVIDER_SITE_OTHER): Payer: Medicare Other

## 2022-12-05 DIAGNOSIS — Z95810 Presence of automatic (implantable) cardiac defibrillator: Secondary | ICD-10-CM

## 2022-12-05 DIAGNOSIS — I5022 Chronic systolic (congestive) heart failure: Secondary | ICD-10-CM

## 2022-12-05 DIAGNOSIS — I255 Ischemic cardiomyopathy: Secondary | ICD-10-CM

## 2022-12-05 NOTE — Progress Notes (Signed)
EPIC Encounter for ICM Monitoring  Patient Name: Hailey Wright is a 77 y.o. female Date: 12/05/2022 Primary Care Physican: Donnajean Lopes, MD Primary Cardiologist: Hochrein Electrophysiologist: Curt Bears 05/18/2022 Weight:  142 lbs 06/23/2022 Weight: 142 lbs 07/28/2022 Weight: 142 lbs 12/05/2022 Weight: 138 - 140 lbs   Spoke with patient and heart failure questions reviewed.  Transmission results reviewed.  Pt reports last week her ring was tight on her hand but then resolved the next day so she did not take PRN Furosemide.   She has been eating restaurant foods in the last week which may contribute to decreased impedance.   Corvue thoracic impedance suggesting possible fluid accumulation starting 11/28/2022 but returning close to baseline.    Prescribed:  Furosemide 40 mg 1 tablet daily as needed.  She takes 0.5 tablet as needed since 40 mg tends to make her legs cramp.  Jardiance 10 mg take 1 tablet by mouth daily before breakfast.   Labs: 05/26/2022 Creatinine 1.03, BUN 21, Potassium 4.2, Sodium 138, GFR 57 A complete set of results can be found in Results Review.   Recommendations:  Advised to take 20 mg Lasix tomorrow morning and limit salt intake.    Follow-up plan: ICM clinic phone appointment on 12/19/2022.   91 day device clinic remote transmission 03/06/2023.     EP/Cardiology Office Visit:   02/02/2023 with Dr. Percival Spanish.  Recall 09/16/2023 with Dr Curt Bears.   Copy of ICM check sent to Dr. Curt Bears.      3 month ICM trend: 12/05/2022.    12-14 Month ICM trend:     [name], RN 12/05/2022 7:45 AM

## 2022-12-06 LAB — CUP PACEART REMOTE DEVICE CHECK
Battery Remaining Longevity: 35 mo
Battery Remaining Percentage: 37 %
Battery Voltage: 2.87 V
Brady Statistic AP VP Percent: 1 %
Brady Statistic AP VS Percent: 1 %
Brady Statistic AS VP Percent: 1 %
Brady Statistic AS VS Percent: 99 %
Brady Statistic RA Percent Paced: 1 %
Brady Statistic RV Percent Paced: 1 %
Date Time Interrogation Session: 20240115020017
HighPow Impedance: 74 Ohm
HighPow Impedance: 74 Ohm
Implantable Lead Connection Status: 753985
Implantable Lead Connection Status: 753985
Implantable Lead Implant Date: 20170727
Implantable Lead Implant Date: 20170727
Implantable Lead Location: 753859
Implantable Lead Location: 753860
Implantable Pulse Generator Implant Date: 20170727
Lead Channel Impedance Value: 390 Ohm
Lead Channel Impedance Value: 410 Ohm
Lead Channel Pacing Threshold Amplitude: 0.5 V
Lead Channel Pacing Threshold Amplitude: 1.25 V
Lead Channel Pacing Threshold Pulse Width: 0.5 ms
Lead Channel Pacing Threshold Pulse Width: 0.5 ms
Lead Channel Sensing Intrinsic Amplitude: 12 mV
Lead Channel Sensing Intrinsic Amplitude: 4.5 mV
Lead Channel Setting Pacing Amplitude: 2 V
Lead Channel Setting Pacing Amplitude: 2.5 V
Lead Channel Setting Pacing Pulse Width: 0.5 ms
Lead Channel Setting Sensing Sensitivity: 0.5 mV
Pulse Gen Serial Number: 7261095
Zone Setting Status: 755011

## 2022-12-13 ENCOUNTER — Ambulatory Visit
Admission: RE | Admit: 2022-12-13 | Discharge: 2022-12-13 | Disposition: A | Discharge status: Home / Self Care | Payer: Medicare Other | Source: Ambulatory Visit | Attending: Internal Medicine | Admitting: Internal Medicine

## 2022-12-13 DIAGNOSIS — J432 Centrilobular emphysema: Secondary | ICD-10-CM | POA: Diagnosis not present

## 2022-12-13 DIAGNOSIS — I700 Atherosclerosis of aorta: Secondary | ICD-10-CM | POA: Diagnosis not present

## 2022-12-13 DIAGNOSIS — R918 Other nonspecific abnormal finding of lung field: Secondary | ICD-10-CM | POA: Diagnosis not present

## 2022-12-13 DIAGNOSIS — Z87891 Personal history of nicotine dependence: Secondary | ICD-10-CM

## 2022-12-15 ENCOUNTER — Other Ambulatory Visit: Payer: Self-pay

## 2022-12-15 DIAGNOSIS — Z87891 Personal history of nicotine dependence: Secondary | ICD-10-CM

## 2022-12-15 DIAGNOSIS — Z122 Encounter for screening for malignant neoplasm of respiratory organs: Secondary | ICD-10-CM

## 2022-12-19 ENCOUNTER — Ambulatory Visit: Payer: Medicare Other | Attending: Cardiology

## 2022-12-19 DIAGNOSIS — I5022 Chronic systolic (congestive) heart failure: Secondary | ICD-10-CM | POA: Diagnosis not present

## 2022-12-19 DIAGNOSIS — Z95810 Presence of automatic (implantable) cardiac defibrillator: Secondary | ICD-10-CM

## 2022-12-20 ENCOUNTER — Telehealth: Payer: Self-pay

## 2022-12-20 NOTE — Telephone Encounter (Signed)
Remote ICM transmission received.  Attempted call to patient regarding ICM remote transmission and left detailed message per DPR.  Advised to return call for any fluid symptoms or questions. Next ICM remote transmission scheduled 12/26/2022.

## 2022-12-20 NOTE — Progress Notes (Signed)
EPIC Encounter for ICM Monitoring  Patient Name: Hailey Wright is a 77 y.o. female Date: 12/20/2022 Primary Care Physican: Donnajean Lopes, MD Primary Cardiologist: Hochrein Electrophysiologist: Curt Bears 05/18/2022 Weight:  142 lbs 06/23/2022 Weight: 142 lbs 07/28/2022 Weight: 142 lbs 12/05/2022 Weight: 138 - 140 lbs   Attempted call to patient and unable to reach.  Left detailed message per DPR regarding transmission. Transmission reviewed.    Corvue thoracic impedance suggesting possible fluid accumulation starting 1/26.    Prescribed:  Furosemide 40 mg 1 tablet daily as needed.  She takes 0.5 tablet as needed since 40 mg tends to make her legs cramp.  Jardiance 10 mg take 1 tablet by mouth daily before breakfast.   Labs: 05/26/2022 Creatinine 1.03, BUN 21, Potassium 4.2, Sodium 138, GFR 57 A complete set of results can be found in Results Review.   Recommendations:  Left voice mail with ICM number and encouraged to call if experiencing any fluid symptoms.  Will advise to take PRN Furosemide if patient is reached.    Follow-up plan: ICM clinic phone appointment on 12/26/2022 to recheck fluid levels.   91 day device clinic remote transmission 03/06/2023.     EP/Cardiology Office Visit:   02/02/2023 with Dr. Percival Spanish.  Recall 09/16/2023 with Dr Curt Bears.   Copy of ICM check sent to Dr. Curt Bears.      3 month ICM trend: 12/19/2022.    12-14 Month ICM trend:     [name], RN 12/20/2022 1:14 PM

## 2022-12-21 ENCOUNTER — Other Ambulatory Visit: Payer: Self-pay

## 2022-12-21 ENCOUNTER — Encounter: Payer: Self-pay | Admitting: Cardiology

## 2022-12-21 MED ORDER — FUROSEMIDE 40 MG PO TABS: 20.0000 mg | ORAL_TABLET | Freq: Every day | ORAL | 5 refills | Status: AC | PRN

## 2022-12-26 ENCOUNTER — Ambulatory Visit: Payer: Medicare Other | Attending: Cardiology

## 2022-12-26 DIAGNOSIS — Z95810 Presence of automatic (implantable) cardiac defibrillator: Secondary | ICD-10-CM

## 2022-12-26 DIAGNOSIS — I5022 Chronic systolic (congestive) heart failure: Secondary | ICD-10-CM

## 2022-12-26 NOTE — Progress Notes (Signed)
EPIC Encounter for ICM Monitoring  Patient Name: Hailey Wright is a 77 y.o. female Date: 12/26/2022 Primary Care Physican: Donnajean Lopes, MD Primary Cardiologist: Hochrein Electrophysiologist: Curt Bears 05/18/2022 Weight:  142 lbs 06/23/2022 Weight: 142 lbs 07/28/2022 Weight: 142 lbs 12/05/2022 Weight: 138 - 140 lbs   Spoke with patient and heart failure questions reviewed.  Transmission results reviewed.  Pt reports taking 20 mg of Furosemide last week and caused her to have severe cramps in her legs and feet.  She cannot understand why she gets severe cramps when she takes the Furosemide and no longer able to take it.     Corvue thoracic impedance suggesting possible fluid accumulation starting 1/26.    Prescribed:  Furosemide 40 mg 1 tablet daily as needed.  She takes 0.5 tablet as needed since 40 mg tends to make her legs cramp.  Jardiance 10 mg take 1 tablet by mouth daily before breakfast.   Labs: 05/26/2022 Creatinine 1.03, BUN 21, Potassium 4.2, Sodium 138, GFR 57 A complete set of results can be found in Results Review.   Recommendations:  Recommendation to limit salt intake to 2000 mg daily and fluid intake to 64 oz daily.  Encouraged to call if experiencing any fluid symptoms.  She will discuss with Dr Percival Spanish about changing to different diuretic that does not cause severe cramps.   Follow-up plan: ICM clinic phone appointment on 01/23/2023.   91 day device clinic remote transmission 03/06/2023.     EP/Cardiology Office Visit:   02/02/2023 with Dr. Percival Spanish.  Recall 09/16/2023 with Dr Curt Bears.   Copy of ICM check sent to Dr. Curt Bears.  3 month ICM trend: 12/26/2022.    12-14 Month ICM trend:     [name], RN 12/26/2022 4:43 PM

## 2023-01-16 NOTE — Progress Notes (Signed)
Remote ICD transmission.   

## 2023-01-23 ENCOUNTER — Ambulatory Visit: Payer: Medicare Other | Attending: Cardiology

## 2023-01-23 DIAGNOSIS — Z95810 Presence of automatic (implantable) cardiac defibrillator: Secondary | ICD-10-CM

## 2023-01-23 DIAGNOSIS — I5022 Chronic systolic (congestive) heart failure: Secondary | ICD-10-CM | POA: Diagnosis not present

## 2023-01-25 NOTE — Progress Notes (Signed)
EPIC Encounter for ICM Monitoring  Patient Name: Waylyn Prosise is a 77 y.o. female Date: 01/25/2023 Primary Care Physican: Donnajean Lopes, MD Primary Cardiologist: Hochrein Electrophysiologist: Curt Bears 07/28/2022 Weight: 142 lbs 01/25/2023/2024 Weight: 140.4 lbs   Spoke with patient and heart failure questions reviewed.  Transmission results reviewed.  Pt reports she had foods high in salt during decreased impedance.  She has been going to gym.     Corvue thoracic impedance normal but was suggesting possible fluid accumulation from 2/10-2/16 and 2/19-2/21.    Prescribed:  Furosemide 40 mg 1 tablet daily as needed.  She takes 0.5 tablet as needed since 40 mg tends to make her legs cramp.  Jardiance 10 mg take 1 tablet by mouth daily before breakfast.   Labs: 05/26/2022 Creatinine 1.03, BUN 21, Potassium 4.2, Sodium 138, GFR 57 A complete set of results can be found in Results Review.   Recommendations:  Will discuss with Dr Percival Spanish at 3/14 OV regarding changing to a different diuretic since Furosemide causes severe cramping.  discuss with Dr Percival Spanish about changing to different diuretic that does not cause severe cramps.   Follow-up plan: ICM clinic phone appointment on 02/27/2023.   91 day device clinic remote transmission 03/06/2023.     EP/Cardiology Office Visit:   02/02/2023 with Dr. Percival Spanish.  Recall 09/16/2023 with Dr Curt Bears.   Copy of ICM check sent to Dr. Curt Bears.  3 month ICM trend: 01/23/2023.    12-14 Month ICM trend:     [name], RN 01/25/2023 2:38 PM

## 2023-02-01 NOTE — Progress Notes (Unsigned)
Cardiology Office Note   Date:  02/02/2023   ID:  Hailey Wright, DOB 1946/05/27, MRN [id number]  PCP:  Donnajean Lopes, MD  Cardiologist:   Minus Breeding, MD  Chief Complaint  Patient presents with   Coronary Artery Disease      History of Present Illness: Hailey Wright is a 77 y.o. female who presents for follow of CAD and ischemic cardiomyopathy.  She has a history of known coronary disease as previously described. She had a negative stress perfusion study in the fall of 2016.  However, she came back with symptoms of acute onset chest pain. The plan was an elective cardiac catheterization. However, prior to that presented with an acute anterior myocardial infarction. She had occlusion of the LAD with acute thrombus in the circumflex. She did have chronic occlusion of her right coronary artery. She was placed on intra-aortic balloon pump and had emergent CABG with a LIMA to the LAD and SVG to OM1. She is left with an ejection fraction of approximately 30%. She had an ICD placed.   Her EF improved on echo in July 2018 to 35%. EF was 35 - 40% in July of last year.  I have been titrating meds.    She had chest pain and had LHC on 09/30/2021 showed severe native CAD with subtotal occlusion of the left main , total occlusion of the ostial LAD, and total occlusion of the LCX as well as CTO of the RCA with proximal occlusion and bridging collaterals. Prior grafts were widely patents. Therefore, continued medical management was recommended. She was continued on DAPT with Aspirin and Plavix and Imdur was increased.   Since I saw last saw her she has done well.  The patient denies any new symptoms such as chest discomfort, neck or arm discomfort. There has been no new shortness of breath, PND or orthopnea. There have been no reported palpitations, presyncope or syncope.     She has had cramping when she rarely takes Lasix.  She takes that if she get too much salt.  She does occasionally get some  shortness of breath but only when she is bending over to do something such as work in the garden or tie her shoe.  Past Medical History:  Diagnosis Date   Acute ST elevation myocardial infarction (STEMI) involving left anterior descending coronary artery (HCC) 02/18/2016   AICD (automatic cardioverter/defibrillator) present    CAD (coronary artery disease) 06/04/2013   CHF (congestive heart failure) (HCC)    Clotting disorder (HCC)    COPD (chronic obstructive pulmonary disease) (HCC)    Coronary artery thrombosis ([place]) 02/18/2016   DVT (deep venous thrombosis) ([place]) 1980s   RLE   Hepatitis A infection 1987   Hyperlipidemia    Hypertension    Ischemic cardiomyopathy    a. Echo 5/17: EF 25-30%, anteroseptal, anterior, anteroseptal and apical akinesis, possible inferior hypokinesis, grade 1 diastolic dysfunction, no evidence of thrombus, trivial AI, mild MR, mild LAE   MI (myocardial infarction) ([place]) 1991   Inferior treated with TPA 1991, POBA RCA 1996   Pneumonia 1980s X 1   RAS (renal artery stenosis) ([place])    Right renal stent 2005   S/P Emergency CABG x 2 02/18/2016   LIMA to LAD, SVG to OM1, EVH via right thigh   Type II diabetes mellitus ([place])    pt denies    Past Surgical History:  Procedure Laterality Date   CARDIAC CATHETERIZATION N/A 02/18/2016  Procedure: Left Heart Cath and Coronary Angiography;  Surgeon: Leonie Man, MD;  Location: [facility];  Service: Cardiovascular;  Laterality: N/A;   COLONOSCOPY WITH PROPOFOL N/A 05/02/2022   Procedure: COLONOSCOPY WITH PROPOFOL;  Surgeon: Yetta Flock, MD;  Location: [facility];  Service: Gastroenterology;  Laterality: N/A;   CORONARY ANGIOPLASTY  1991   CORONARY ARTERY BYPASS GRAFT N/A 02/18/2016   Procedure: CORONARY ARTERY BYPASS GRAFTING (CABG) times 2 using left internal mammary and right greater saphenous vein;  Surgeon: Rexene Alberts, MD;  Location: [place] [facility];  Service: Open Heart Surgery;  Laterality: N/A;    EP IMPLANTABLE DEVICE N/A 06/16/2016   Procedure: ICD Implant;  Surgeon: Will Meredith Leeds, MD;  Location: [facility];  Service: Cardiovascular;  Laterality: N/A;   LEFT HEART CATH AND CORS/GRAFTS ANGIOGRAPHY N/A 09/30/2021   Procedure: LEFT HEART CATH AND CORS/GRAFTS ANGIOGRAPHY;  Surgeon: Shelva Majestic A;  Location: [facility];  Service: Cardiovascular;  Laterality: N/A;   POLYPECTOMY  05/02/2022   Procedure: POLYPECTOMY;  Surgeon: Yetta Flock, MD;  Location: [facility];  Service: Gastroenterology;;   RENAL ARTERY STENT Right 2005   RHINOPLASTY Bilateral 1968   SHOULDER SURGERY Right 2019   Dr Onnie Graham   TONSILLECTOMY AND ADENOIDECTOMY  1950s     Current Outpatient Medications  Medication Sig Dispense Refill   aspirin 81 MG tablet Take 81 mg by mouth daily.     calcium-vitamin D (OSCAL WITH D) 500-200 MG-UNIT per tablet Take 1 tablet by mouth 2 (two) times daily.     carboxymethylcellulose (REFRESH PLUS) 0.5 % SOLN Place 1 drop into both eyes 4 (four) times daily.     Co-Enzyme Q10 200 MG CAPS Take 200 mg by mouth at bedtime.     empagliflozin (JARDIANCE) 10 MG TABS tablet Take 1 tablet (10 mg total) by mouth daily before breakfast. 90 tablet 3   ENTRESTO 24-26 MG TAKE 1 TABLET TWICE A DAY 180 tablet 3   ezetimibe (ZETIA) 10 MG tablet Take 10 mg by mouth at bedtime.     isosorbide mononitrate (IMDUR) 120 MG 24 hr tablet Take 1 tablet (120 mg total) by mouth daily. 90 tablet 1   Magnesium Oxide 420 MG TABS Take 420 mg by mouth daily.     metoprolol succinate (TOPROL-XL) 25 MG 24 hr tablet TAKE 1 TABLET DAILY (OVERDUE FOR OFFICE VISIT. CALL FOR APPOINTMENT/FUTURE REFILLS) (Patient taking differently: Take 25 mg by mouth daily as needed (SBP >100).) 90 tablet 3   Multiple Vitamin (MULTIVITAMIN) tablet Take 1 tablet by mouth daily.     NITROSTAT 0.4 MG SL tablet Place 1 tablet (0.4 mg total) under the tongue every 5 (five) minutes as needed for chest pain. 25  tablet 1   potassium chloride SA (KLOR-CON M20) 20 MEQ tablet Take 1 tablet as needed when Lasix is taken 30 tablet 2   rosuvastatin (CRESTOR) 40 MG tablet Take 1 tablet (40 mg total) by mouth daily. 90 tablet 1   umeclidinium-vilanterol (ANORO ELLIPTA) 62.5-25 MCG/INH AEPB Inhale 1 puff into the lungs at bedtime.     albuterol (VENTOLIN HFA) 108 (90 Base) MCG/ACT inhaler Inhale 2 puffs into the lungs every 6 (six) hours as needed for wheezing or shortness of breath. (Patient not taking: Reported on 02/02/2023)     furosemide (LASIX) 40 MG tablet Take 0.5 tablets (20 mg total) by mouth daily as needed for edema. (Patient not taking: Reported on 02/02/2023) 30 tablet  5   No current facility-administered medications for this visit.    Allergies:   Patient has no known allergies.    ROS:  Please see the history of present illness.   Otherwise, review of systems are positive for none.   All other systems are reviewed and negative.    PHYSICAL EXAM: VS:  BP 108/60   Pulse 81   Ht '5\' 6"'$  (1.676 m)   Wt 143 lb 9.6 oz (65.1 kg)   LMP  (LMP Unknown)   SpO2 96%   BMI 23.18 kg/m  , BMI Body mass index is 23.18 kg/m. GENERAL:  Well appearing NECK:  No jugular venous distention, waveform within normal limits, carotid upstroke brisk and symmetric, no bruits, no thyromegaly LUNGS:  Clear to auscultation bilaterally CHEST:  Unremarkable HEART:  PMI not displaced or sustained,S1 and S2 within normal limits, no S3, no S4, no clicks, no rubs, 2 out of 6 apical systolic murmur radiating slightly at the aortic outflow tract, no diastolic murmurs ABD:  Flat, positive bowel sounds normal in frequency in pitch, no bruits, no rebound, no guarding, no midline pulsatile mass, no hepatomegaly, no splenomegaly EXT:  2 plus pulses throughout, no edema, no cyanosis no clubbing   EKG:  EKG is not ordered today. NA  Recent Labs: No results found for requested labs within last 365 days.    Lipid Panel No  results found for: "CHOL", "TRIG", "HDL", "CHOLHDL", "VLDL", "LDLCALC", "LDLDIRECT"    Wt Readings from Last 3 Encounters:  02/02/23 143 lb 9.6 oz (65.1 kg)  09/21/22 144 lb (65.3 kg)  08/05/22 143 lb 3.2 oz (65 kg)      Other studies Reviewed Additional studies/ records that were reviewed today include: Labs Review of the above records demonstrates:  Please see elsewhere in the note.    ASSESSMENT AND PLAN:  CAD : The patient has no new sypmtoms.  No further cardiovascular testing is indicated.  We will continue with aggressive risk reduction and meds as listed.   Ischemic CM: Her ejection fraction is 30 to 35%.  No change in therapy.  Low blood pressure precludes further med titration.  HL: LDL was 60 with an HDL of 71.  She is going to have follow-up labs at the end of this month with her primary provider and I asked him to complete a fasting lipid   ICD:     She is up-to-date with follow-up and I reviewed the tracings from January  Cramping: I gave her potassium to go along with her as needed Lasix.  Current medicines are reviewed at length with the patient today.  The patient does not have concerns regarding medicines.  The following changes have been made: As above  Labs/ tests ordered today include:  None No orders of the defined types were placed in this encounter.   Disposition:   FU with me in 12 months.      Signed, Minus Breeding, MD  02/02/2023 8:07 AM    [facility]

## 2023-02-02 ENCOUNTER — Ambulatory Visit: Payer: Medicare Other | Attending: Cardiology | Admitting: Cardiology

## 2023-02-02 ENCOUNTER — Encounter: Payer: Self-pay | Admitting: Cardiology

## 2023-02-02 VITALS — BP 108/60 | HR 81 | Ht 66.0 in | Wt 143.6 lb

## 2023-02-02 DIAGNOSIS — E785 Hyperlipidemia, unspecified: Secondary | ICD-10-CM | POA: Diagnosis not present

## 2023-02-02 DIAGNOSIS — I2510 Atherosclerotic heart disease of native coronary artery without angina pectoris: Secondary | ICD-10-CM | POA: Insufficient documentation

## 2023-02-02 DIAGNOSIS — Z95810 Presence of automatic (implantable) cardiac defibrillator: Secondary | ICD-10-CM | POA: Diagnosis not present

## 2023-02-02 DIAGNOSIS — I255 Ischemic cardiomyopathy: Secondary | ICD-10-CM | POA: Diagnosis not present

## 2023-02-02 MED ORDER — POTASSIUM CHLORIDE CRYS ER 20 MEQ PO TBCR: EXTENDED_RELEASE_TABLET | ORAL | 2 refills | Status: AC

## 2023-02-02 NOTE — Patient Instructions (Addendum)
Medication Instructions:   TAKE Potassium 20 meq as needed when Lasix is taken   *If you need a refill on your cardiac medications before your next appointment, please call your pharmacy*  Lab Work: NONE ordered at this time of appointment   If you have labs (blood work) drawn today and your tests are completely normal, you will receive your results only by: [place] (if you have MyChart) OR A paper copy in the mail If you have any lab test that is abnormal or we need to change your treatment, we will call you to review the results.  Testing/Procedures: NONE ordered at this time of appointment   Follow-Up: At [facility], you and your health needs are our priority.  As part of our continuing mission to provide you with exceptional heart care, we have created designated Provider Care Teams.  These Care Teams include your primary Cardiologist (physician) and Advanced Practice Providers (APPs -  Physician Assistants and Nurse Practitioners) who all work together to provide you with the care you need, when you need it.   Your next appointment:   11 month(s)  Provider:   Minus Breeding, MD     Other Instructions

## 2023-02-13 DIAGNOSIS — K219 Gastro-esophageal reflux disease without esophagitis: Secondary | ICD-10-CM | POA: Diagnosis not present

## 2023-02-13 DIAGNOSIS — E785 Hyperlipidemia, unspecified: Secondary | ICD-10-CM | POA: Diagnosis not present

## 2023-02-13 DIAGNOSIS — I700 Atherosclerosis of aorta: Secondary | ICD-10-CM | POA: Diagnosis not present

## 2023-02-13 DIAGNOSIS — R739 Hyperglycemia, unspecified: Secondary | ICD-10-CM | POA: Diagnosis not present

## 2023-02-20 DIAGNOSIS — I2510 Atherosclerotic heart disease of native coronary artery without angina pectoris: Secondary | ICD-10-CM | POA: Diagnosis not present

## 2023-02-20 DIAGNOSIS — G8929 Other chronic pain: Secondary | ICD-10-CM | POA: Diagnosis not present

## 2023-02-20 DIAGNOSIS — Z0000 Encounter for general adult medical examination without abnormal findings: Secondary | ICD-10-CM | POA: Diagnosis not present

## 2023-02-20 DIAGNOSIS — I7389 Other specified peripheral vascular diseases: Secondary | ICD-10-CM | POA: Diagnosis not present

## 2023-02-20 DIAGNOSIS — J439 Emphysema, unspecified: Secondary | ICD-10-CM | POA: Diagnosis not present

## 2023-02-20 DIAGNOSIS — I10 Essential (primary) hypertension: Secondary | ICD-10-CM | POA: Diagnosis not present

## 2023-02-20 DIAGNOSIS — R82998 Other abnormal findings in urine: Secondary | ICD-10-CM | POA: Diagnosis not present

## 2023-02-20 DIAGNOSIS — E785 Hyperlipidemia, unspecified: Secondary | ICD-10-CM | POA: Diagnosis not present

## 2023-02-20 DIAGNOSIS — H029 Unspecified disorder of eyelid: Secondary | ICD-10-CM | POA: Diagnosis not present

## 2023-02-20 DIAGNOSIS — Z1339 Encounter for screening examination for other mental health and behavioral disorders: Secondary | ICD-10-CM | POA: Diagnosis not present

## 2023-02-20 DIAGNOSIS — Z1331 Encounter for screening for depression: Secondary | ICD-10-CM | POA: Diagnosis not present

## 2023-02-20 DIAGNOSIS — M25512 Pain in left shoulder: Secondary | ICD-10-CM | POA: Diagnosis not present

## 2023-02-20 DIAGNOSIS — I700 Atherosclerosis of aorta: Secondary | ICD-10-CM | POA: Diagnosis not present

## 2023-02-27 ENCOUNTER — Ambulatory Visit: Payer: Medicare Other | Attending: Cardiology

## 2023-02-27 ENCOUNTER — Telehealth: Payer: Self-pay

## 2023-02-27 DIAGNOSIS — I5022 Chronic systolic (congestive) heart failure: Secondary | ICD-10-CM | POA: Diagnosis not present

## 2023-02-27 DIAGNOSIS — Z95810 Presence of automatic (implantable) cardiac defibrillator: Secondary | ICD-10-CM

## 2023-02-27 NOTE — Progress Notes (Signed)
EPIC Encounter for ICM Monitoring  Patient Name: Hailey Wright is a 77 y.o. female Date: 02/27/2023 Primary Care Physican: Garlan Fillers, MD Primary Cardiologist: Hochrein Electrophysiologist: Elberta Fortis 07/28/2022 Weight: 142 lbs 01/25/2023/2024 Weight: 140.4 lbs   Attempted call to patient and unable to reach.  Left detailed message per DPR regarding transmission. Transmission reviewed.    Corvue thoracic impedance suggesting possible fluid accumulation starting 4/3.    Prescribed:  Furosemide 40 mg 1 tablet daily as needed.  She takes 0.5 tablet as needed since 40 mg tends to make her legs cramp.  Jardiance 10 mg take 1 tablet by mouth daily before breakfast.   Labs: 05/26/2022 Creatinine 1.03, BUN 21, Potassium 4.2, Sodium 138, GFR 57 A complete set of results can be found in Results Review.   Recommendations:  Left voice mail with ICM number and encouraged to call if experiencing any fluid symptoms.   Follow-up plan: ICM clinic phone appointment on 03/06/2023 to recheck fluid levels.   91 day device clinic remote transmission 06/05/2023.     EP/Cardiology Office Visit:   Recall 01/05/2024 with Dr. Antoine Poche.  Recall 09/16/2023 with Dr Elberta Fortis.   Copy of ICM check sent to Dr. Elberta Fortis.  3 month ICM trend: 02/27/2023.    12-14 Month ICM trend:     Karie Soda, RN 02/27/2023 2:00 PM

## 2023-02-27 NOTE — Telephone Encounter (Signed)
Remote ICM transmission received.  Attempted call to patient regarding ICM remote transmission and left detailed message per DPR.  Advised to return call for any fluid symptoms or questions. Next ICM remote transmission scheduled 03/06/2023.    

## 2023-03-06 ENCOUNTER — Ambulatory Visit: Payer: Medicare Other | Attending: Cardiology

## 2023-03-06 ENCOUNTER — Ambulatory Visit (INDEPENDENT_AMBULATORY_CARE_PROVIDER_SITE_OTHER): Payer: Medicare Other

## 2023-03-06 DIAGNOSIS — I5022 Chronic systolic (congestive) heart failure: Secondary | ICD-10-CM | POA: Diagnosis not present

## 2023-03-06 DIAGNOSIS — Z95810 Presence of automatic (implantable) cardiac defibrillator: Secondary | ICD-10-CM

## 2023-03-06 DIAGNOSIS — I255 Ischemic cardiomyopathy: Secondary | ICD-10-CM

## 2023-03-06 NOTE — Progress Notes (Signed)
EPIC Encounter for ICM Monitoring  Patient Name: Hailey Wright is a 77 y.o. female Date: 03/06/2023 Primary Care Physican: Garlan Fillers, MD Primary Cardiologist: Hochrein Electrophysiologist: Elberta Fortis 07/28/2022 Weight: 142 lbs 01/25/2023/2024 Weight: 140.4 lbs 142 lbs   Spoke with patient and heart failure questions reviewed.  Transmission results reviewed.  Pt reports she took PRN Furosemide with Potassium for fluid (no cramps when taking Potassium).     Corvue thoracic impedance suggesting fluid levels returned to normal after taking PRN Lasix.  She thought the fluid may have been caused by eating foods high in salt during decreased impedance.    Prescribed:  Furosemide 40 mg 1 tablet daily as needed.  She takes 0.5 tablet as needed since 40 mg tends to make her legs cramp.  Potassium 20 mEq take 1 tablet as needed when taking Lasix Jardiance 10 mg take 1 tablet by mouth daily before breakfast.   Labs: 05/26/2022 Creatinine 1.03, BUN 21, Potassium 4.2, Sodium 138, GFR 57 A complete set of results can be found in Results Review.   Recommendations:  No changes and encouraged to call if experiencing any fluid symptoms.   Follow-up plan: ICM clinic phone appointment on 04/03/2023.   91 day device clinic remote transmission 06/05/2023.     EP/Cardiology Office Visit:   Recall 01/05/2024 with Dr. Antoine Poche.  Recall 09/16/2023 with Dr Elberta Fortis.   Copy of ICM check sent to Dr. Elberta Fortis.  3 month ICM trend: 03/06/2023.    12-14 Month ICM trend:     Karie Soda, RN 03/06/2023 7:13 AM

## 2023-03-07 LAB — CUP PACEART REMOTE DEVICE CHECK
Battery Remaining Longevity: 33 mo
Battery Remaining Percentage: 34 %
Battery Voltage: 2.86 V
Brady Statistic AP VP Percent: 1 %
Brady Statistic AP VS Percent: 1 %
Brady Statistic AS VP Percent: 1 %
Brady Statistic AS VS Percent: 99 %
Brady Statistic RA Percent Paced: 1 %
Brady Statistic RV Percent Paced: 1 %
Date Time Interrogation Session: 20240415020015
HighPow Impedance: 74 Ohm
HighPow Impedance: 74 Ohm
Implantable Lead Connection Status: 753985
Implantable Lead Connection Status: 753985
Implantable Lead Implant Date: 20170727
Implantable Lead Implant Date: 20170727
Implantable Lead Location: 753859
Implantable Lead Location: 753860
Implantable Pulse Generator Implant Date: 20170727
Lead Channel Impedance Value: 400 Ohm
Lead Channel Impedance Value: 430 Ohm
Lead Channel Pacing Threshold Amplitude: 0.5 V
Lead Channel Pacing Threshold Amplitude: 1.25 V
Lead Channel Pacing Threshold Pulse Width: 0.5 ms
Lead Channel Pacing Threshold Pulse Width: 0.5 ms
Lead Channel Sensing Intrinsic Amplitude: 12 mV
Lead Channel Sensing Intrinsic Amplitude: 5 mV
Lead Channel Setting Pacing Amplitude: 2 V
Lead Channel Setting Pacing Amplitude: 2.5 V
Lead Channel Setting Pacing Pulse Width: 0.5 ms
Lead Channel Setting Sensing Sensitivity: 0.5 mV
Pulse Gen Serial Number: 7261095
Zone Setting Status: 755011

## 2023-03-15 DIAGNOSIS — M7502 Adhesive capsulitis of left shoulder: Secondary | ICD-10-CM | POA: Diagnosis not present

## 2023-03-15 DIAGNOSIS — M7542 Impingement syndrome of left shoulder: Secondary | ICD-10-CM | POA: Diagnosis not present

## 2023-03-16 DIAGNOSIS — L814 Other melanin hyperpigmentation: Secondary | ICD-10-CM | POA: Diagnosis not present

## 2023-03-16 DIAGNOSIS — L821 Other seborrheic keratosis: Secondary | ICD-10-CM | POA: Diagnosis not present

## 2023-03-16 DIAGNOSIS — D23112 Other benign neoplasm of skin of right lower eyelid, including canthus: Secondary | ICD-10-CM | POA: Diagnosis not present

## 2023-03-21 DIAGNOSIS — H04203 Unspecified epiphora, bilateral lacrimal glands: Secondary | ICD-10-CM | POA: Diagnosis not present

## 2023-03-21 DIAGNOSIS — D485 Neoplasm of uncertain behavior of skin: Secondary | ICD-10-CM | POA: Diagnosis not present

## 2023-03-21 DIAGNOSIS — H029 Unspecified disorder of eyelid: Secondary | ICD-10-CM | POA: Diagnosis not present

## 2023-03-21 DIAGNOSIS — H04563 Stenosis of bilateral lacrimal punctum: Secondary | ICD-10-CM | POA: Diagnosis not present

## 2023-04-03 ENCOUNTER — Ambulatory Visit: Payer: Medicare Other | Attending: Cardiology

## 2023-04-03 DIAGNOSIS — Z95810 Presence of automatic (implantable) cardiac defibrillator: Secondary | ICD-10-CM | POA: Diagnosis not present

## 2023-04-03 DIAGNOSIS — J209 Acute bronchitis, unspecified: Secondary | ICD-10-CM | POA: Diagnosis not present

## 2023-04-03 DIAGNOSIS — J040 Acute laryngitis: Secondary | ICD-10-CM | POA: Diagnosis not present

## 2023-04-03 DIAGNOSIS — J439 Emphysema, unspecified: Secondary | ICD-10-CM | POA: Diagnosis not present

## 2023-04-03 DIAGNOSIS — I5022 Chronic systolic (congestive) heart failure: Secondary | ICD-10-CM

## 2023-04-03 DIAGNOSIS — R0981 Nasal congestion: Secondary | ICD-10-CM | POA: Diagnosis not present

## 2023-04-03 DIAGNOSIS — Z1152 Encounter for screening for COVID-19: Secondary | ICD-10-CM | POA: Diagnosis not present

## 2023-04-03 DIAGNOSIS — R058 Other specified cough: Secondary | ICD-10-CM | POA: Diagnosis not present

## 2023-04-03 DIAGNOSIS — R5383 Other fatigue: Secondary | ICD-10-CM | POA: Diagnosis not present

## 2023-04-05 NOTE — Progress Notes (Signed)
EPIC Encounter for ICM Monitoring  Patient Name: Hailey Wright is a 77 y.o. female Date: 04/05/2023 Primary Care Physican: Garlan Fillers, MD Primary Cardiologist: Hochrein Electrophysiologist: Elberta Fortis 07/28/2022 Weight: 142 lbs 01/25/2023/2024 Weight: 140.4 lbs 04/05/2023 Weight: 140 lbs   Spoke with patient and heart failure questions reviewed.  Transmission results reviewed.  Pt asymptomatic for fluid accumulation.  She is just getting over bronchitis and taking antibiotics.    Corvue thoracic impedance suggesting normal fluid levels.    Prescribed:  Furosemide 40 mg 1 tablet daily as needed.  She takes 0.5 tablet as needed since 40 mg tends to make her legs cramp.  Potassium 20 mEq take 1 tablet as needed when taking Lasix Jardiance 10 mg take 1 tablet by mouth daily before breakfast.   Labs: 05/26/2022 Creatinine 1.03, BUN 21, Potassium 4.2, Sodium 138, GFR 57 A complete set of results can be found in Results Review.   Recommendations:  No changes and encouraged to call if experiencing any fluid symptoms.   Follow-up plan: ICM clinic phone appointment on 05/08/2023.   91 day device clinic remote transmission 06/05/2023.     EP/Cardiology Office Visit:   Recall 01/05/2024 with Dr. Antoine Poche.  Recall 09/16/2023 with Dr Elberta Fortis.   Copy of ICM check sent to Dr. Elberta Fortis.  3 month ICM trend: 04/03/2023.    12-14 Month ICM trend:     Karie Soda, RN 04/05/2023 8:14 AM

## 2023-04-10 NOTE — Progress Notes (Signed)
Remote ICD transmission.   

## 2023-04-21 ENCOUNTER — Telehealth: Payer: Self-pay | Admitting: *Deleted

## 2023-04-21 ENCOUNTER — Encounter: Payer: Self-pay | Admitting: Cardiology

## 2023-04-21 DIAGNOSIS — H04541 Stenosis of right lacrimal canaliculi: Secondary | ICD-10-CM | POA: Diagnosis not present

## 2023-04-21 DIAGNOSIS — H04203 Unspecified epiphora, bilateral lacrimal glands: Secondary | ICD-10-CM | POA: Diagnosis not present

## 2023-04-21 DIAGNOSIS — Z01818 Encounter for other preprocedural examination: Secondary | ICD-10-CM | POA: Diagnosis not present

## 2023-04-21 DIAGNOSIS — D485 Neoplasm of uncertain behavior of skin: Secondary | ICD-10-CM | POA: Diagnosis not present

## 2023-04-21 NOTE — Telephone Encounter (Signed)
Hailey Wright 77 year old female is requesting preoperative cardiac evaluation for RLL biopsy of lesion, tissue transfer, right medial canthoplasty, RLL plastic repair of canaliculi, LLL punctoplasty, RLL NLD probe with stent.  Her PMH includes coronary artery disease and ischemic cardiomyopathy.  She had a negative stress test in the fall 2016.  She underwent LHC for chest pain 09/30/2021.  She was noted to have severe native coronary artery disease with subtotal occlusion of her left main, total occlusion of her ostial LAD and total occlusion of her circumflex.  Her prior grafts were widely patent.  Medical management was recommended.  She was continued on DAPT and her Imdur was increased.  She was last seen in the clinic on 02/02/2023.  She was doing well from a cardiac standpoint at that time.  She reported occasional shortness of breath with activities that required bending over.  May her aspirin be held for 10 days prior to her procedure and resume 3 days postprocedure?  Please direct your response to CV DIV preop pool.  Thank you for your help.  Hailey Wright. Hailey Cheese NP-C     04/21/2023, 4:07 PM [facility] [address] Office [phone] Fax [phone]

## 2023-04-21 NOTE — Progress Notes (Signed)
PERIOPERATIVE PRESCRIPTION FOR IMPLANTED CARDIAC DEVICE PROGRAMMING  Patient Information: Name:  Hailey Wright  DOB:  1946/03/31  MRN:  [id number]    Patient Name: [name]  DOB: 11/23/1945 MRN: [id number]     SURGEON IS ALSO ASKING FOR DEVICE CLEARANCE AS WELL. WILL SEND TO DEVICE CLINIC.    Request for Surgical Clearance     Procedure:   RLL Bx OF LESION, TISSUE TRANSFER, R MEDIAL CANTHOPLASTY, RLL PLASTIC REPAIR OF CANALICULI, LLL PUNCTOPLASTY, RLL NLD PROBE w/STENT   Date of Surgery:  Clearance TBD                                 Surgeon:  DR. Dairl Ponder  Surgeon's Group or Practice Name:  [facility] Phone number:  [phone] [phone] Fax number:  [phone] Device Information:  Clinic EP Physician:  Loman Brooklyn, MD   Device Type:  Pacemaker and Defibrillator Manufacturer and Phone #:  [employer]: [phone] Pacemaker Dependent?:  No. Date of Last Device Check:  04/03/23 Remote, last in clinic 09/21/22  Normal Device Function?:  Yes.   Estimated Battery Longevity as of 04/02/20 2 years 8 months   Electrophysiologist's Recommendations:  Have magnet available. Provide continuous ECG monitoring when magnet is used or reprogramming is to be performed.  Procedure should not interfere with device function.  No device programming or magnet placement needed. Do not drape any vibrating instruments across patients left chest   Per Device Clinic Standing Orders, Dorathy Daft, RN  12:46 PM 04/21/2023

## 2023-04-21 NOTE — Telephone Encounter (Signed)
Pt has been scheduled for tele pre op appt 05/02/23 @ 2 pm. Med rec and consent are done.

## 2023-04-21 NOTE — Telephone Encounter (Signed)
   Name: Hailey Wright  DOB: [birth year]  MRN: [id number]  Primary Cardiologist: Rollene Rotunda, MD   Preoperative team, please contact this patient and set up a phone call appointment for further preoperative risk assessment. Please obtain consent and complete medication review. Thank you for your help.  I confirm that guidance regarding antiplatelet and oral anticoagulation therapy has been completed and, if necessary, noted below.  Patient's aspirin is not prescribed by cardiology.  Recommendations for holding aspirin will need to come from prescribing provider.     Ronney Asters, NP 04/21/2023, 12:33 PM [facility]

## 2023-04-21 NOTE — Telephone Encounter (Signed)
Left message to call back to set up tele appt. I will send FYI to requesting office though about ASA, so they may reach out to the provider managing ASA for the pt.

## 2023-04-21 NOTE — Telephone Encounter (Signed)
Office called stating that pt is addiment that our provider prescribed Aspirin for her. Office would ike a callback regarding this matter. Please advise

## 2023-04-21 NOTE — Telephone Encounter (Signed)
Pt has been scheduled for tele pre op appt 05/02/23 @ 2 pm. Med rec and consent are done.     Patient Consent for Virtual Visit        Hailey Wright has provided verbal consent on 04/21/2023 for a virtual visit (video or telephone).   CONSENT FOR VIRTUAL VISIT FOR:  Hailey Wright  By participating in this virtual visit I agree to the following:  I hereby voluntarily request, consent and authorize [facility] and its employed or contracted physicians, physician assistants, nurse practitioners or other licensed health care professionals (the Practitioner), to provide me with telemedicine health care services (the "Services") as deemed necessary by the treating Practitioner. I acknowledge and consent to receive the Services by the Practitioner via telemedicine. I understand that the telemedicine visit will involve communicating with the Practitioner through live audiovisual communication technology and the disclosure of certain medical information by electronic transmission. I acknowledge that I have been given the opportunity to request an in-person assessment or other available alternative prior to the telemedicine visit and am voluntarily participating in the telemedicine visit.  I understand that I have the right to withhold or withdraw my consent to the use of telemedicine in the course of my care at any time, without affecting my right to future care or treatment, and that the Practitioner or I may terminate the telemedicine visit at any time. I understand that I have the right to inspect all information obtained and/or recorded in the course of the telemedicine visit and may receive copies of available information for a reasonable fee.  I understand that some of the potential risks of receiving the Services via telemedicine include:  Delay or interruption in medical evaluation due to technological equipment failure or disruption; Information transmitted may not be sufficient (e.g. poor  resolution of images) to allow for appropriate medical decision making by the Practitioner; and/or  In rare instances, security protocols could fail, causing a breach of personal health information.  Furthermore, I acknowledge that it is my responsibility to provide information about my medical history, conditions and care that is complete and accurate to the best of my ability. I acknowledge that Practitioner's advice, recommendations, and/or decision may be based on factors not within their control, such as incomplete or inaccurate data provided by me or distortions of diagnostic images or specimens that may result from electronic transmissions. I understand that the practice of medicine is not an exact science and that Practitioner makes no warranties or guarantees regarding treatment outcomes. I acknowledge that a copy of this consent can be made available to me via my patient portal [facility] MyChart), or I can request a printed copy by calling the office of [facility].    I understand that my insurance will be billed for this visit.   I have read or had this consent read to me. I understand the contents of this consent, which adequately explains the benefits and risks of the Services being provided via telemedicine.  I have been provided ample opportunity to ask questions regarding this consent and the Services and have had my questions answered to my satisfaction. I give my informed consent for the services to be provided through the use of telemedicine in my medical care

## 2023-04-21 NOTE — Telephone Encounter (Signed)
   Pre-operative Risk Assessment    Patient Name: Hailey Wright  DOB: Oct 05, 1946 MRN: [id number]    SURGEON IS ALSO ASKING FOR DEVICE CLEARANCE AS WELL. WILL SEND TO DEVICE CLINIC.   Request for Surgical Clearance    Procedure:   RLL Bx OF LESION, TISSUE TRANSFER, R MEDIAL CANTHOPLASTY, RLL PLASTIC REPAIR OF CANALICULI, LLL PUNCTOPLASTY, RLL NLD PROBE w/STENT  Date of Surgery:  Clearance TBD                                 Surgeon:  DR. Dairl Ponder  Surgeon's Group or Practice Name:  [facility] Phone number:  [phone] EXT [phone] Fax number:  [phone]   Type of Clearance Requested:   - Medical ; ASA HOLD TIME TOTAL 13 DAYS; HOLD x 10 DAYS PRIOR AND RESUME ON DAY 3 POST PROCEDURE   Type of Anesthesia:  MAC WITH IV SEDATION   Additional requests/questions:    Elpidio Anis   04/21/2023, 12:16 PM

## 2023-04-21 NOTE — Telephone Encounter (Signed)
Pt returning call

## 2023-04-21 NOTE — Telephone Encounter (Signed)
I will forward to pre op APP to address ASA

## 2023-04-24 NOTE — Telephone Encounter (Signed)
   Name: Hailey Wright  DOB: [birth year]  MRN: [id number]   Primary Cardiologist: Rollene Rotunda, MD  Chart reviewed as part of pre-operative protocol coverage. Hailey Wright was last seen on 02/02/2023 by Dr. Antoine Poche.  She was doing well from a cardiac perspective.  Therefore, based on ACC/AHA guidelines, the patient would be at acceptable risk for the planned procedure without further cardiovascular testing.   Per Dr. Antoine Poche: "OK to hold ASA but 10 days seems like a long time.  I would suggest this be addressed by the surgeon at the time of surgery and hold it for the shortest duration thought to be associated with the lowest reasonable risk of bleeding."  I will route this recommendation to the requesting party via Epic fax function and remove from pre-op pool. Please call with questions.  Carlos Levering, NP 04/24/2023, 1:26 PM

## 2023-04-24 NOTE — Telephone Encounter (Signed)
Hailey Rotunda, MD  Cv Div Nl Triage17 hours ago (2:53 PM)    OK to hold ASA but 10 days seems like a long time.  I would suggest this be addressed by the surgeon at the time of surgery and hold it for the shortest duration thought to be associated with the lowest reasonable risk of bleeding.      Routed to Pre-Op [facility]

## 2023-05-02 ENCOUNTER — Ambulatory Visit: Payer: Medicare Other | Attending: Cardiovascular Disease

## 2023-05-02 DIAGNOSIS — Z01810 Encounter for preprocedural cardiovascular examination: Secondary | ICD-10-CM | POA: Diagnosis not present

## 2023-05-02 NOTE — Progress Notes (Signed)
Virtual Visit via Telephone Note   Because of Hailey Wright co-morbid illnesses, she is at least at moderate risk for complications without adequate follow up.  This format is felt to be most appropriate for this patient at this time.  The patient did not have access to video technology/had technical difficulties with video requiring transitioning to audio format only (telephone).  All issues noted in this document were discussed and addressed.  No physical exam could be performed with this format.  Please refer to the patient's chart for her consent to telehealth for [facility].  Evaluation Performed:  Preoperative cardiovascular risk assessment _____________   Date:  05/02/2023   Patient ID:  Hailey Wright, DOB 1946-01-30, MRN [id number] Patient Location:  Home Provider location:   Office  Primary Care Provider:  Garlan Fillers, MD Primary Cardiologist:  Rollene Rotunda, MD  Chief Complaint / Patient Profile   77 y.o. y/o female with a h/o CAD and ischemic cardiomyopathy who is pending  RLL Bx OF LESION, TISSUE TRANSFER, R MEDIAL CANTHOPLASTY, RLL PLASTIC REPAIR OF CANALICULI, LLL PUNCTOPLASTY, RLL NLD PROBE w/STENT and presents today for telephonic preoperative cardiovascular risk assessment.  History of Present Illness    Hailey Wright is a 77 y.o. female who presents via audio/video conferencing for a telehealth visit today.  Pt was last seen in cardiology clinic on 02/02/2023 by Dr. Antoine Poche.  At that time Hailey Wright was doing well.  The patient is now pending procedure as outlined above. Since her last visit, she tells me that she is doing fine without any cardiovascular issues.  She has the gym a few days a week and does treadmill, bike, and balancing exercises.  She also uses some weighted equipment.  Overall, she has exceeded 4 METS on the DASI.   Per Dr. Antoine Poche: "OK to hold ASA but 10 days seems like a long time.  I would suggest this be addressed by the surgeon at  the time of surgery and hold it for the shortest duration thought to be associated with the lowest reasonable risk of bleeding."   ASA 5-7 day hold is the typical recommendation.  Please restart when medically safe to do so.  Past Medical History    Past Medical History:  Diagnosis Date   Acute ST elevation myocardial infarction (STEMI) involving left anterior descending coronary artery (HCC) 02/18/2016   AICD (automatic cardioverter/defibrillator) present    CAD (coronary artery disease) 06/04/2013   CHF (congestive heart failure) (HCC)    Clotting disorder (HCC)    COPD (chronic obstructive pulmonary disease) (HCC)    Coronary artery thrombosis (HCC) 02/18/2016   DVT (deep venous thrombosis) (HCC) 1980s   RLE   Hepatitis A infection 1987   Hyperlipidemia    Hypertension    Ischemic cardiomyopathy    a. Echo 5/17: EF 25-30%, anteroseptal, anterior, anteroseptal and apical akinesis, possible inferior hypokinesis, grade 1 diastolic dysfunction, no evidence of thrombus, trivial AI, mild MR, mild LAE   MI (myocardial infarction) (HCC) 1991   Inferior treated with TPA 1991, POBA RCA 1996   Pneumonia 1980s X 1   RAS (renal artery stenosis) (HCC)    Right renal stent 2005   S/P Emergency CABG x 2 02/18/2016   LIMA to LAD, SVG to OM1, EVH via right thigh   Type II diabetes mellitus (HCC)    pt denies   Past Surgical History:  Procedure Laterality Date   CARDIAC CATHETERIZATION N/A 02/18/2016   Procedure: Left Heart  Cath and Coronary Angiography;  Surgeon: Marykay Lex, MD;  Location: [facility] INVASIVE CV LAB;  Service: Cardiovascular;  Laterality: N/A;   COLONOSCOPY WITH PROPOFOL N/A 05/02/2022   Procedure: COLONOSCOPY WITH PROPOFOL;  Surgeon: Benancio Deeds, MD;  Location: [facility] ENDOSCOPY;  Service: Gastroenterology;  Laterality: N/A;   CORONARY ANGIOPLASTY  1991   CORONARY ARTERY BYPASS GRAFT N/A 02/18/2016   Procedure: CORONARY ARTERY BYPASS GRAFTING (CABG) times 2 using left internal  mammary and right greater saphenous vein;  Surgeon: Purcell Nails, MD;  Location: [facility] OR;  Service: Open Heart Surgery;  Laterality: N/A;   EP IMPLANTABLE DEVICE N/A 06/16/2016   Procedure: ICD Implant;  Surgeon: Will Jorja Loa, MD;  Location: [facility] INVASIVE CV LAB;  Service: Cardiovascular;  Laterality: N/A;   LEFT HEART CATH AND CORS/GRAFTS ANGIOGRAPHY N/A 09/30/2021   Procedure: LEFT HEART CATH AND CORS/GRAFTS ANGIOGRAPHY;  Surgeon: Nicki Guadalajara A;  Location: [facility] INVASIVE CV LAB;  Service: Cardiovascular;  Laterality: N/A;   POLYPECTOMY  05/02/2022   Procedure: POLYPECTOMY;  Surgeon: Benancio Deeds, MD;  Location: [facility] ENDOSCOPY;  Service: Gastroenterology;;   RENAL ARTERY STENT Right 2005   RHINOPLASTY Bilateral 1968   SHOULDER SURGERY Right 2019   Dr Rennis Chris   TONSILLECTOMY AND ADENOIDECTOMY  1950s    Allergies  No Known Allergies  Home Medications    Prior to Admission medications   Medication Sig Start Date End Date Taking? Authorizing Provider  albuterol (VENTOLIN HFA) 108 (90 Base) MCG/ACT inhaler Inhale 2 puffs into the lungs every 6 (six) hours as needed for wheezing or shortness of breath.    [provider]  aspirin 81 MG tablet Take 81 mg by mouth daily.    [provider]  calcium-vitamin D (OSCAL WITH D) 500-200 MG-UNIT per tablet Take 1 tablet by mouth 2 (two) times daily.    [provider]  carboxymethylcellulose (REFRESH PLUS) 0.5 % SOLN Place 1 drop into both eyes 4 (four) times daily.    [provider]  Co-Enzyme Q10 200 MG CAPS Take 200 mg by mouth at bedtime.    [provider]  empagliflozin (JARDIANCE) 10 MG TABS tablet Take 1 tablet (10 mg total) by mouth daily before breakfast. 01/25/22   Corrin Parker, PA-C  ENTRESTO 24-26 MG TAKE 1 TABLET TWICE A DAY 01/31/20   Rollene Rotunda, MD  ezetimibe (ZETIA) 10 MG tablet Take 10 mg by mouth at bedtime.    [provider]  furosemide (LASIX) 40 MG tablet  Take 0.5 tablets (20 mg total) by mouth daily as needed for edema. 12/21/22   Rollene Rotunda, MD  isosorbide mononitrate (IMDUR) 120 MG 24 hr tablet Take 1 tablet (120 mg total) by mouth daily. 10/06/21   Rollene Rotunda, MD  Magnesium Oxide 420 MG TABS Take 420 mg by mouth daily.    [provider]  metoprolol succinate (TOPROL-XL) 25 MG 24 hr tablet TAKE 1 TABLET DAILY (OVERDUE FOR OFFICE VISIT. CALL FOR APPOINTMENT/FUTURE REFILLS) Patient taking differently: Take 25 mg by mouth daily as needed (SBP >100). 06/04/20   Rollene Rotunda, MD  Multiple Vitamin (MULTIVITAMIN) tablet Take 1 tablet by mouth daily.    [provider]  NITROSTAT 0.4 MG SL tablet Place 1 tablet (0.4 mg total) under the tongue every 5 (five) minutes as needed for chest pain. Patient not taking: Reported on 04/21/2023 02/11/22   Rollene Rotunda, MD  potassium chloride SA (KLOR-CON M20) 20 MEQ tablet Take 1 tablet as  needed when Lasix is taken 02/02/23   Rollene Rotunda, MD  rosuvastatin (CRESTOR) 40 MG tablet Take 1 tablet (40 mg total) by mouth daily. 03/19/20   Rollene Rotunda, MD  umeclidinium-vilanterol (ANORO ELLIPTA) 62.5-25 MCG/INH AEPB Inhale 1 puff into the lungs at bedtime.    [provider]    Physical Exam    Vital Signs:  Hailey Wright does not have vital signs available for review today.  Given telephonic nature of communication, physical exam is limited. AAOx3. NAD. Normal affect.  Speech and respirations are unlabored.  Accessory Clinical Findings    None  Assessment & Plan    1.  Preoperative Cardiovascular Risk Assessment:  Ms. Bain perioperative risk of a major cardiac event is 6.6% according to the Revised Cardiac Risk Index (RCRI).  Therefore, she is at high risk for perioperative complications.   Her functional capacity is good at 5.62 METs according to the [facility] Activity Status Index (DASI). Recommendations: According to ACC/AHA guidelines, no further cardiovascular  testing needed.  The patient may proceed to surgery at acceptable risk.   Antiplatelet and/or Anticoagulation Recommendations: Aspirin can be held for 5-7 days prior to her surgery.  Please resume Aspirin post operatively when it is felt to be safe from a bleeding standpoint.   The patient was advised that if she develops new symptoms prior to surgery to contact our office to arrange for a follow-up visit, and she verbalized understanding.   A copy of this note will be routed to requesting surgeon.  Time:   Today, I have spent 7 minutes with the patient with telehealth technology discussing medical history, symptoms, and management plan.     Hailey Dory, PA-C  05/02/2023, 2:10 PM

## 2023-05-03 ENCOUNTER — Other Ambulatory Visit: Payer: Self-pay

## 2023-05-03 NOTE — Progress Notes (Signed)
Spoke with Hailey Wright rep, he will be here for patients surgery.    Orders for device have been requested.

## 2023-05-03 NOTE — Progress Notes (Signed)
Spoke with [employer] at [phone].  They will page Donnie Coffin, ICD rep to call back.

## 2023-05-03 NOTE — Progress Notes (Signed)
[facility] CALL  Patient was given pre-op instructions over the phone. The opportunity was given for the patient to ask questions. No further questions asked. Patient verbalized understanding of instructions given.   PCP - Jarome Matin, MD Cardiologist - Melany Guernsey  PPM/ICD - ICD Device Orders - requested Rep Notified - Rubin from [facility]  Chest x-ray - 06/17/16 EKG - 09/21/22 Stress Test - 09/16/2015 ECHO - 11/18/21 Cardiac Cath - 09/30/21  Sleep Study - "20 years ago" CPAP - no  Fasting Blood Sugar - denies diabetes. States she takes Jardiance for her heart failure. Told to hold Jardiance in the am of surgery. Checks Blood Sugar _____ times a day  Blood Thinner Instructions:na Aspirin Instructions:told to hold aspirin the morning of surgery.   ERAS Protcol -clears until 1030 PRE-SURGERY Ensure or G2- no  COVID TEST- na   Anesthesia review: yes-cardiac history-ICD  Patient denies shortness of breath, fever, cough and chest pain over the phone call    Surgical Instructions    Your procedure is scheduled on 05/04/23  Report to [facility] Main Entrance "A" at 11 A.M., then check in with the Admitting office.  Call this number if you have problems the morning of surgery:  [phone]    Remember:  Do not eat after midnight the night before your surgery  You may drink clear liquids until 10:30am the morning of your surgery.   Clear liquids allowed are: Water, Non-Citrus Juices (without pulp), Carbonated Beverages, Clear Tea, Black Coffee ONLY (NO MILK, CREAM OR POWDERED CREAMER of any kind), and Gatorade   Take these medicines the morning of surgery with A SIP OF WATER: Toprol XL, Imdur,Crestor,Albuterol    As of today, STOP taking any Aspirin (unless otherwise instructed by your surgeon) Aleve, Naproxen, Ibuprofen, Motrin, Advil, Goody's, BC's, all herbal medications, fish oil, and all vitamins.  [facility] is not responsible for any belongings or  valuables. .   Do NOT Smoke (Tobacco/Vaping)  24 hours prior to your procedure  If you use a CPAP at night, you may bring your mask for your overnight stay.   Contacts, glasses, hearing aids, dentures or partials may not be worn into surgery, please bring cases for these belongings   Patients discharged the day of surgery will not be allowed to drive home, and someone needs to stay with them for 24 hours.   SURGICAL WAITING ROOM VISITATION You may have 1 visitor in the pre-op area at a time determined by the pre-op nurse. (Visitor may not switch out) Patients having surgery or a procedure in a hospital may have two support people in the waiting room. Children under the age of 17 must have an adult with them who is not the patient. They may stay in the waiting area during the procedure and may switch out with other visitors. If the patient needs to stay at the hospital during part of their recovery, the visitor guidelines for inpatient rooms apply.  Please refer to the [facility] website for the visitor guidelines for Inpatients (after your surgery is over and you are in a regular room).     Special instructions:    Oral Hygiene is also important to reduce your risk of infection.  Remember - BRUSH YOUR TEETH THE MORNING OF SURGERY WITH YOUR REGULAR TOOTHPASTE.   Day of Surgery:  Take a shower the day of or night before with antibacterial soap. Wear Clean/Comfortable clothing the morning of surgery Do not apply any deodorants/lotions.   Do  not wear jewelry or makeup Do not wear lotions, powders, perfumes/colognes, or deodorant. Do not shave 48 hours prior to surgery.  Men may shave face and neck. Do not bring valuables to the hospital. Do not wear nail polish, gel polish, artificial nails, or any other type of covering on natural nails (fingers and toes) If you have artificial nails or gel coating that need to be removed by a nail salon, please have this removed prior to surgery.  Artificial nails or gel coating may interfere with anesthesia's ability to adequately monitor your vital signs. Remember to brush your teeth WITH YOUR REGULAR TOOTHPASTE.

## 2023-05-03 NOTE — Progress Notes (Signed)
Dr. Sandford Craze made aware of patient added on to schedule, history and medications.  Anesthesia to evaluate patient in pre-op.

## 2023-05-04 ENCOUNTER — Ambulatory Visit (HOSPITAL_COMMUNITY): Payer: Medicare Other | Admitting: Anesthesiology

## 2023-05-04 ENCOUNTER — Ambulatory Visit (HOSPITAL_COMMUNITY)
Admission: RE | Admit: 2023-05-04 | Discharge: 2023-05-04 | Disposition: A | Discharge status: Home / Self Care | Payer: Medicare Other | Attending: Optometry | Admitting: Optometry

## 2023-05-04 ENCOUNTER — Encounter (HOSPITAL_COMMUNITY): Payer: Self-pay | Admitting: Optometry

## 2023-05-04 ENCOUNTER — Ambulatory Visit (HOSPITAL_BASED_OUTPATIENT_CLINIC_OR_DEPARTMENT_OTHER): Payer: Medicare Other | Admitting: Anesthesiology

## 2023-05-04 ENCOUNTER — Other Ambulatory Visit: Payer: Self-pay

## 2023-05-04 ENCOUNTER — Encounter: Payer: Self-pay | Admitting: Cardiology

## 2023-05-04 ENCOUNTER — Encounter (HOSPITAL_COMMUNITY)
Admission: RE | Disposition: A | Discharge status: Home / Self Care | Payer: Self-pay | Source: Home / Self Care | Attending: Optometry

## 2023-05-04 DIAGNOSIS — Z87891 Personal history of nicotine dependence: Secondary | ICD-10-CM | POA: Insufficient documentation

## 2023-05-04 DIAGNOSIS — D23112 Other benign neoplasm of skin of right lower eyelid, including canthus: Secondary | ICD-10-CM | POA: Insufficient documentation

## 2023-05-04 DIAGNOSIS — I110 Hypertensive heart disease with heart failure: Secondary | ICD-10-CM | POA: Diagnosis not present

## 2023-05-04 DIAGNOSIS — J449 Chronic obstructive pulmonary disease, unspecified: Secondary | ICD-10-CM

## 2023-05-04 DIAGNOSIS — I509 Heart failure, unspecified: Secondary | ICD-10-CM | POA: Diagnosis not present

## 2023-05-04 DIAGNOSIS — I2510 Atherosclerotic heart disease of native coronary artery without angina pectoris: Secondary | ICD-10-CM | POA: Diagnosis not present

## 2023-05-04 DIAGNOSIS — H04561 Stenosis of right lacrimal punctum: Secondary | ICD-10-CM | POA: Insufficient documentation

## 2023-05-04 DIAGNOSIS — H04562 Stenosis of left lacrimal punctum: Secondary | ICD-10-CM

## 2023-05-04 DIAGNOSIS — I252 Old myocardial infarction: Secondary | ICD-10-CM | POA: Diagnosis not present

## 2023-05-04 DIAGNOSIS — H04541 Stenosis of right lacrimal canaliculi: Secondary | ICD-10-CM

## 2023-05-04 DIAGNOSIS — H04203 Unspecified epiphora, bilateral lacrimal glands: Secondary | ICD-10-CM | POA: Insufficient documentation

## 2023-05-04 DIAGNOSIS — E1151 Type 2 diabetes mellitus with diabetic peripheral angiopathy without gangrene: Secondary | ICD-10-CM | POA: Diagnosis not present

## 2023-05-04 DIAGNOSIS — D485 Neoplasm of uncertain behavior of skin: Secondary | ICD-10-CM | POA: Diagnosis not present

## 2023-05-04 HISTORY — PX: LACRIMAL DUCT EXPLORATION: SHX6569

## 2023-05-04 HISTORY — PX: COMPLEX WOUND CLOSURE: SHX6446

## 2023-05-04 HISTORY — PX: CANTHOPLASTY: SHX1286

## 2023-05-04 HISTORY — PX: ADJACENT TISSUE TRANSFER/TISSUE REARRANGEMENT: SHX6829

## 2023-05-04 HISTORY — PX: LESION REMOVAL: SHX5196

## 2023-05-04 HISTORY — PX: LID LESION EXCISION: SHX5204

## 2023-05-04 HISTORY — PX: TEAR DUCT PROBING: SHX793

## 2023-05-04 LAB — BASIC METABOLIC PANEL
Anion gap: 7 (ref 5–15)
BUN: 15 mg/dL (ref 8–23)
CO2: 26 mmol/L (ref 22–32)
Calcium: 9.8 mg/dL (ref 8.9–10.3)
Chloride: 107 mmol/L (ref 98–111)
Creatinine, Ser: 0.9 mg/dL (ref 0.44–1.00)
GFR, Estimated: >60 mL/min (ref >60)
Glucose, Bld: 85 mg/dL (ref 70–99)
Potassium: 3.7 mmol/L (ref 3.5–5.1)
Sodium: 140 mmol/L (ref 135–145)

## 2023-05-04 LAB — CBC
HCT: 46.1 % — ABNORMAL HIGH (ref 36.0–46.0)
Hemoglobin: 14.4 g/dL (ref 12.0–15.0)
MCH: 27 pg (ref 26.0–34.0)
MCHC: 31.2 g/dL (ref 30.0–36.0)
MCV: 86.3 fL (ref 80.0–100.0)
Platelets: 176 10*3/uL (ref 150–400)
RBC: 5.34 MIL/uL — ABNORMAL HIGH (ref 3.87–5.11)
RDW: 15.9 % — ABNORMAL HIGH (ref 11.5–15.5)
WBC: 5.5 10*3/uL (ref 4.0–10.5)
nRBC: 0 % (ref 0.0–0.2)

## 2023-05-04 LAB — GLUCOSE, CAPILLARY: Glucose-Capillary: 66 mg/dL — ABNORMAL LOW (ref 70–99)

## 2023-05-04 SURGERY — EXPLORATION, LACRIMAL DUCT
Anesthesia: Monitor Anesthesia Care | Site: Eye | Laterality: Right

## 2023-05-04 MED ORDER — OXYMETAZOLINE HCL 0.05 % NA SOLN
1.0000 | NASAL | Status: AC
  Administered 2023-05-04 (×3): 1 via NASAL

## 2023-05-04 MED ORDER — PHENYLEPHRINE 80 MCG/ML (10ML) SYRINGE FOR IV PUSH (FOR BLOOD PRESSURE SUPPORT)
PREFILLED_SYRINGE | INTRAVENOUS | Status: AC
  Filled 2023-05-04: qty 10

## 2023-05-04 MED ORDER — SODIUM HYALURONATE 10 MG/ML IO SOLUTION
PREFILLED_SYRINGE | INTRAOCULAR | Status: AC
  Filled 2023-05-04: qty 0.85

## 2023-05-04 MED ORDER — SODIUM HYALURONATE 10 MG/ML IO SOLUTION
PREFILLED_SYRINGE | INTRAOCULAR | Status: DC | PRN
  Administered 2023-05-04: .85 mL via INTRAOCULAR

## 2023-05-04 MED ORDER — SODIUM CHLORIDE 0.9 % IV SOLN: INTRAVENOUS | Status: DC

## 2023-05-04 MED ORDER — OXYMETAZOLINE HCL 0.05 % NA SOLN
NASAL | Status: AC
  Filled 2023-05-04: qty 30

## 2023-05-04 MED ORDER — LIDOCAINE-EPINEPHRINE 1 %-1:100000 IJ SOLN
INTRAMUSCULAR | Status: AC
  Filled 2023-05-04: qty 1

## 2023-05-04 MED ORDER — ORAL CARE MOUTH RINSE: 15.0000 mL | Freq: Once | OROMUCOSAL | Status: AC

## 2023-05-04 MED ORDER — PROPOFOL 1000 MG/100ML IV EMUL
INTRAVENOUS | Status: AC
  Filled 2023-05-04: qty 100

## 2023-05-04 MED ORDER — BSS IO SOLN
INTRAOCULAR | Status: DC | PRN
  Administered 2023-05-04: 15 mL

## 2023-05-04 MED ORDER — ACETAMINOPHEN 500 MG PO TABS
1000.0000 mg | ORAL_TABLET | Freq: Once | ORAL | Status: AC
  Administered 2023-05-04: 1000 mg via ORAL
  Filled 2023-05-04: qty 2

## 2023-05-04 MED ORDER — PROPOFOL 500 MG/50ML IV EMUL
INTRAVENOUS | Status: DC | PRN
  Administered 2023-05-04: 100 ug/kg/min via INTRAVENOUS

## 2023-05-04 MED ORDER — BACITRACIN ZINC 500 UNIT/GM EX OINT
TOPICAL_OINTMENT | CUTANEOUS | Status: AC
  Filled 2023-05-04: qty 28.35

## 2023-05-04 MED ORDER — ERYTHROMYCIN 5 MG/GM OP OINT
TOPICAL_OINTMENT | Freq: Once | OPHTHALMIC | Status: AC
  Administered 2023-05-04: 1 via OPHTHALMIC
  Filled 2023-05-04: qty 3.5

## 2023-05-04 MED ORDER — NEOMYCIN-POLYMYXIN-DEXAMETH 3.5-10000-0.1 OP SUSP
OPHTHALMIC | Status: DC | PRN
  Administered 2023-05-04: 1 [drp] via OPHTHALMIC

## 2023-05-04 MED ORDER — ERYTHROMYCIN 5 MG/GM OP OINT
TOPICAL_OINTMENT | OPHTHALMIC | Status: AC
  Filled 2023-05-04: qty 3.5

## 2023-05-04 MED ORDER — CHLORHEXIDINE GLUCONATE 0.12 % MT SOLN
OROMUCOSAL | Status: AC
  Filled 2023-05-04: qty 15

## 2023-05-04 MED ORDER — ONDANSETRON HCL 4 MG/2ML IJ SOLN
INTRAMUSCULAR | Status: DC | PRN
  Administered 2023-05-04: 4 mg via INTRAVENOUS

## 2023-05-04 MED ORDER — BUPIVACAINE HCL (PF) 0.75 % IJ SOLN
INTRAMUSCULAR | Status: AC
  Filled 2023-05-04: qty 10

## 2023-05-04 MED ORDER — AMISULPRIDE (ANTIEMETIC) 5 MG/2ML IV SOLN: 10.0000 mg | Freq: Once | INTRAVENOUS | Status: DC | PRN

## 2023-05-04 MED ORDER — SODIUM CHLORIDE 0.9% FLUSH: 3.0000 mL | Freq: Two times a day (BID) | INTRAVENOUS | Status: DC

## 2023-05-04 MED ORDER — LIDOCAINE 2% (20 MG/ML) 5 ML SYRINGE
INTRAMUSCULAR | Status: DC | PRN
  Administered 2023-05-04: 20 mg via INTRAVENOUS

## 2023-05-04 MED ORDER — PHENYLEPHRINE 80 MCG/ML (10ML) SYRINGE FOR IV PUSH (FOR BLOOD PRESSURE SUPPORT)
PREFILLED_SYRINGE | INTRAVENOUS | Status: DC | PRN
  Administered 2023-05-04 (×9): 80 ug via INTRAVENOUS
  Administered 2023-05-04: 160 ug via INTRAVENOUS

## 2023-05-04 MED ORDER — LIDOCAINE-EPINEPHRINE 1 %-1:100000 IJ SOLN
INTRAMUSCULAR | Status: DC | PRN
  Administered 2023-05-04: 1 mL

## 2023-05-04 MED ORDER — NEOMYCIN-POLYMYXIN-DEXAMETH 3.5-10000-0.1 OP SUSP
1.0000 [drp] | Freq: Once | OPHTHALMIC | Status: DC
  Filled 2023-05-04: qty 0.1

## 2023-05-04 MED ORDER — PROPOFOL 10 MG/ML IV BOLUS
INTRAVENOUS | Status: AC
  Filled 2023-05-04: qty 20

## 2023-05-04 MED ORDER — TRIAMCINOLONE ACETONIDE 40 MG/ML IJ SUSP
INTRAMUSCULAR | Status: AC
  Filled 2023-05-04: qty 5

## 2023-05-04 MED ORDER — FLUORESCEIN SODIUM 1 MG OP STRP
ORAL_STRIP | OPHTHALMIC | Status: AC
  Filled 2023-05-04: qty 1

## 2023-05-04 MED ORDER — LIDOCAINE 2% (20 MG/ML) 5 ML SYRINGE
INTRAMUSCULAR | Status: AC
  Filled 2023-05-04: qty 5

## 2023-05-04 MED ORDER — CHLORHEXIDINE GLUCONATE 0.12 % MT SOLN
15.0000 mL | Freq: Once | OROMUCOSAL | Status: AC
  Administered 2023-05-04: 15 mL via OROMUCOSAL

## 2023-05-04 MED ORDER — TETRACAINE HCL 0.5 % OP SOLN
OPHTHALMIC | Status: AC
  Filled 2023-05-04: qty 4

## 2023-05-04 MED ORDER — ONDANSETRON HCL 4 MG/2ML IJ SOLN
INTRAMUSCULAR | Status: AC
  Filled 2023-05-04: qty 2

## 2023-05-04 MED ORDER — BSS IO SOLN
INTRAOCULAR | Status: AC
  Filled 2023-05-04: qty 15

## 2023-05-04 MED ORDER — STERILE WATER FOR IRRIGATION IR SOLN
Status: DC | PRN
  Administered 2023-05-04: 1000 mL

## 2023-05-04 MED ORDER — FENTANYL CITRATE (PF) 250 MCG/5ML IJ SOLN
INTRAMUSCULAR | Status: AC
  Filled 2023-05-04: qty 5

## 2023-05-04 MED ORDER — FENTANYL CITRATE (PF) 100 MCG/2ML IJ SOLN: 25.0000 ug | INTRAMUSCULAR | Status: DC | PRN

## 2023-05-04 MED ORDER — ROCURONIUM BROMIDE 10 MG/ML (PF) SYRINGE
PREFILLED_SYRINGE | INTRAVENOUS | Status: AC
  Filled 2023-05-04: qty 10

## 2023-05-04 MED ORDER — TETRACAINE HCL 0.5 % OP SOLN
OPHTHALMIC | Status: DC | PRN
  Administered 2023-05-04: 1 [drp] via OPHTHALMIC

## 2023-05-04 MED ORDER — NEOMYCIN-POLYMYXIN-DEXAMETH 3.5-10000-0.1 OP OINT
TOPICAL_OINTMENT | OPHTHALMIC | Status: AC
  Filled 2023-05-04: qty 3.5

## 2023-05-04 SURGICAL SUPPLY — 55 items
ANTIFOG SOL W/FOAM PAD STRL (MISCELLANEOUS)
APPLICATOR COTTON TIP 6 STRL (MISCELLANEOUS) ×3 IMPLANT
APPLICATOR COTTON TIP 6IN STRL (MISCELLANEOUS) ×3 IMPLANT
BAG COUNTER SPONGE SURGICOUNT (BAG) ×3 IMPLANT
BLADE SURG 15 STRL LF DISP TIS (BLADE) ×3 IMPLANT
BLADE SURG 15 STRL SS (BLADE) ×3
BUR DIAMOND COARSE 3.0 (BURR) IMPLANT
BUR TAPER CHOANAL ATRESIA 30K (BURR) IMPLANT
COAG SUCTION FOOTSWITCH 10FR (SUCTIONS) IMPLANT
COAGULATOR SUCT 8FR VV (MISCELLANEOUS) IMPLANT
CORD BIPOLAR FORCEPS 12FT (ELECTRODE) ×3 IMPLANT
COVER SURGICAL LIGHT HANDLE (MISCELLANEOUS) ×3 IMPLANT
DRAPE ORTHO SPLIT 77X108 STRL (DRAPES) ×3
DRAPE SURG ORHT 6 SPLT 77X108 (DRAPES) ×3 IMPLANT
ELECT NDL BLADE 2-5/6 (NEEDLE) IMPLANT
ELECT NEEDLE BLADE 2-5/6 (NEEDLE) IMPLANT
ELECT REM PT RETURN 9FT ADLT (ELECTROSURGICAL)
ELECTRODE REM PT RTRN 9FT ADLT (ELECTROSURGICAL) IMPLANT
FORCEPS BIPOLAR SPETZLER 8 1.0 (NEUROSURGERY SUPPLIES) ×3 IMPLANT
GAUZE 4X4 16PLY ~~LOC~~+RFID DBL (SPONGE) ×3 IMPLANT
GLOVE SS BIOGEL STRL SZ 7.5 (GLOVE) ×3 IMPLANT
GOWN STRL REUS W/ TWL LRG LVL3 (GOWN DISPOSABLE) ×6 IMPLANT
GOWN STRL REUS W/TWL LRG LVL3 (GOWN DISPOSABLE) ×6
KIT BASIN OR (CUSTOM PROCEDURE TRAY) ×3 IMPLANT
KIT SUCTION CATH 14FR (SUCTIONS) ×3 IMPLANT
KNIFE CRESCENT 1.75 EDGEAHEAD (BLADE) ×3 IMPLANT
NDL PRECISIONGLIDE 27X1.5 (NEEDLE) ×3 IMPLANT
NEEDLE PRECISIONGLIDE 27X1.5 (NEEDLE) ×3 IMPLANT
NS IRRIG 1000ML POUR BTL (IV SOLUTION) ×3 IMPLANT
PACK CATARACT CUSTOM (CUSTOM PROCEDURE TRAY) ×3 IMPLANT
PACK VITRECTOMY CUSTOM (CUSTOM PROCEDURE TRAY) IMPLANT
PAD ARMBOARD 7.5X6 YLW CONV (MISCELLANEOUS) ×6 IMPLANT
PATTIES SURGICAL .5 X3 (DISPOSABLE) ×3 IMPLANT
PENCIL BUTTON HOLSTER BLD 10FT (ELECTRODE) IMPLANT
SET INTBT LACRIMAL .016X.025 (DRAIN) ×3 IMPLANT
SOLUTION ANTFG W/FOAM PAD STRL (MISCELLANEOUS) IMPLANT
SPONGE SURGIFOAM ABS GEL 12-7 (HEMOSTASIS) ×3 IMPLANT
SUT CHROMIC 4 0 S 4 (SUTURE) IMPLANT
SUT ETHILON 7 0 P 1 (SUTURE) IMPLANT
SUT GUT PLAIN 6-0 1X18 ABS (SUTURE) IMPLANT
SUT PLAIN 5 0 P 3 18 (SUTURE) IMPLANT
SUT PLAIN 6 0 TG1408 (SUTURE) IMPLANT
SUT PROLENE 6 0 CC (SUTURE) IMPLANT
SUT PROLENE 6 0 P 1 18 (SUTURE) ×3 IMPLANT
SUT PROLENE 6 0 P 3 18 (SUTURE) IMPLANT
SUT VICRYL 6 0 P 1 18 (SUTURE) ×3 IMPLANT
SUT VICRYL 6 0 S 14 UNDY (SUTURE) IMPLANT
SUT VICRYL 6 0 S 29 12 (SUTURE) IMPLANT
TOWEL GREEN STERILE FF (TOWEL DISPOSABLE) ×6 IMPLANT
TREPHINE OPHTH SISLER 21GA (BLADE) IMPLANT
TUBE CONNECTING 12X1/4 (SUCTIONS) ×3 IMPLANT
TUBE LACRIMAL MINI MONOKA 2X40 (Ophthalmic Related) IMPLANT
TUBING EXTENTION W/L.L. (IV SETS) ×3 IMPLANT
TUBING STRAIGHTSHOT EPS 5PK (TUBING) IMPLANT
WATER STERILE IRR 1000ML POUR (IV SOLUTION) ×3 IMPLANT

## 2023-05-04 NOTE — Anesthesia Preprocedure Evaluation (Signed)
Anesthesia Evaluation  Patient identified by MRN, date of birth, ID band Patient awake    Reviewed: Allergy & Precautions, NPO status , Patient's Chart, lab work & pertinent test results  Airway Mallampati: II  TM Distance: >3 FB Neck ROM: Full    Dental  (+) Dental Advisory Given   Pulmonary COPD,  COPD inhaler, former smoker   breath sounds clear to auscultation       Cardiovascular hypertension, Pt. on medications and Pt. on home beta blockers + CAD, + Past MI, + Peripheral Vascular Disease and +CHF  + Cardiac Defibrillator  Rhythm:Regular Rate:Normal     Neuro/Psych negative neurological ROS     GI/Hepatic negative GI ROS, Neg liver ROS,,,  Endo/Other  diabetes    Renal/GU negative Renal ROS     Musculoskeletal   Abdominal   Peds  Hematology negative hematology ROS (+)   Anesthesia Other Findings   Reproductive/Obstetrics                             Anesthesia Physical Anesthesia Plan  ASA: 4  Anesthesia Plan: MAC   Post-op Pain Management: Tylenol PO (pre-op)*   Induction:   PONV Risk Score and Plan: 2 and Propofol infusion, Ondansetron and Treatment may vary due to age or medical condition  Airway Management Planned: Natural Airway and Nasal Cannula  Additional Equipment:   Intra-op Plan:   Post-operative Plan:   Informed Consent: I have reviewed the patients History and Physical, chart, labs and discussed the procedure including the risks, benefits and alternatives for the proposed anesthesia with the patient or authorized representative who has indicated his/her understanding and acceptance.       Plan Discussed with:   Anesthesia Plan Comments:        Anesthesia Quick Evaluation

## 2023-05-04 NOTE — Interval H&P Note (Signed)
History and Physical Interval Note:  05/04/2023 1:34 PM  Hailey Wright  has presented today for surgery, with the diagnosis of LACREMAL CANALICULAR STENOSIS RIGHT EYE PUNCTAL STENOSIS LEFT EYE.  The various methods of treatment have been discussed with the patient and family. After consideration of risks, benefits and other options for treatment, the patient has consented to  Procedure(s): PLASTIC REPAIR OF CANALICULAR;  NASOLACRIMAL PROBE WITH TUBE OR STENT (Right) PUNCTOPLASTY (Right) as a surgical intervention.  The patient's history has been reviewed, patient examined, no change in status, stable for surgery.  I have reviewed the patient's chart and labs.  Questions were answered to the patient's satisfaction.     Dairl Ponder

## 2023-05-04 NOTE — Progress Notes (Signed)
PERIOPERATIVE PRESCRIPTION FOR IMPLANTED CARDIAC DEVICE PROGRAMMING  Patient Information: Name:  Ronae Noell  DOB:  [birth year]  MRN:  [id number]    Planned Procedure:  Plastic repair of canalicular, nasolacrimal probe with tube or stent, bilateral. Punctoplasty left  Surgeon:  Dr, Belva Chimes  Date of Procedure:  05/04/2023  Cautery will be used.  Position during surgery:  Supine   Please send documentation back to:  [facility] (Fax # [phone])  Device Information:  Clinic EP Physician:  Loman Brooklyn, MD   Device Type:  Defibrillator Manufacturer and Phone #:  [employer]: [phone] Pacemaker Dependent?:  No. Date of Last Device Check:  04/03/23 Normal Device Function?:  Yes.    Electrophysiologist's Recommendations:  Have magnet available. Provide continuous ECG monitoring when magnet is used or reprogramming is to be performed.  Procedure may interfere with device function.  Magnet should be placed over device during procedure.  Per Device Clinic Standing Orders, Lenor Coffin, RN  7:53 AM 05/04/2023

## 2023-05-04 NOTE — Discharge Instructions (Signed)
Dairl Ponder  MD,PhD Ophthalmic Plastic and Reconstructive Surgery [facility] [phone]  (office)

## 2023-05-04 NOTE — Anesthesia Procedure Notes (Signed)
Procedure Name: MAC Date/Time: 05/04/2023 1:57 PM  Performed by: Quentin Ore, CRNAPre-anesthesia Checklist: Patient identified, Emergency Drugs available, Suction available and Patient being monitored Patient Re-evaluated:Patient Re-evaluated prior to induction Oxygen Delivery Method: Nasal cannula Induction Type: IV induction Placement Confirmation: positive ETCO2 Dental Injury: Teeth and Oropharynx as per pre-operative assessment

## 2023-05-04 NOTE — Op Note (Signed)
Operative Note PATIENT NAME:  Hailey Wright HOSPITAL: [facility] MRN:  [id number] CSN: [id number] DATE OF SERVICE:  05/04/2023 DATE OF BIRTH:  06/05/1946  PREOPERATIVE DIAGNOSIS:   1. Neoplasms of undetermined significance of skin, right lower eyelid (2) 2. Bilateral epiphora 3. Lacrimal canalicular stenosis, right 4. Punctal stenosis, acquired, right  POSTOPERATIVE DIAGNOSIS:  same  PROCEDURE(S) PERFORMED:  Right lower eyelid biopsy of marginal lesion 5 mm 67840 Right lower lid biopsy of lesion below margin 0.5 mm 11440 Right lower eyelid adjacent tissue transfer 0.7 sq cm 14060 Right medial canthoplasty 67950 Right lower eyelid plastic repair of canaliculi 68700 Right lower lid punctoplasty 68440 Right nasolacrimal duct probe with placement of mini monoka stent 08657 Intermediate layered wound closure, right lower eyelid 2 cm 12051  SURGEON:  Dairl Ponder, MD, PhD  ASSISTANT:  none  ANESTHESIA:  MAC  FLUIDS GIVEN: Per Anesthesia   ESTIMATED BLOOD LOSS:  < 5 cc  TUBES/DRAINS: Mini Monoka stent, right lower eyelid/nasolacrimal duct  SPECIMENS:   Right lower eyelid lesion central Right lower eyelid lesion medial  COMPLICATIONS:  None  INDICATIONS:  The patient has an enlarging lesion on her right lower eyelid that needs biopsy for tissue diagnosis.  Additionally, the patient has epiphora which is bothersome.  This is felt to be a result of punctal and canalicular lacrimal stenosis on the right lower eyelid.  Informed consent had been provided prior to the day of surgery at the patient's clinic visit.  All questions were answered.  The patient understands that the goal of surgery is to improve visual function. Prior to entering the operative suite, the general surgical consent was signed. All questions were answered.  The patient/advocate understands the risks of surgery include but are not limited to bleeding, infection, scar formation, asymmetry, the  need for additional surgical intervention and other less common outcomes noted on the specific eyelid informed consent and anesthesia risks listed on the anesthesia consent. The patient accepts the risks and desires to proceed with surgery        DESCRIPTION OF PROCEDURE: After obtaining informed consent, the patient was taken back to the operating room and laid supine on the operating room table.  Under monitored anesthesia care, the operative site(s) were anesthetized with 2% lidocaine with epinephrine and bupivicaine.    Attention was then directed to the right lower eyelid.  The stenotic punctum was dilated and then a two-step punctoplasty performed using Vanness scissors.  Hemostasis was spontaneous.  A 00 Bowman probe was then passed with significant resistance to a hard stop.  A cystoscopy lacrimal trephine was then passed into the dilated canaliculus and gentle rotary motion provided for several passes until easy passage was observed.  A mini medical stent was then passed into the dilated canaliculus, down into the nasolacrimal system, and secured medially with 6-0 nylon suture with the knots externalized to the skin.  A 5 mm round lesion immediately anterior to the punctum was then approached.  It was lifted and excised in its entirety with Wescott scissors.  2 mm medial to this lesion, an additional lesion was observed.  It was again lifted and excised in its entirety with Wescott scissors.  These lesions were submitted pathology as permanent specimens.  Following this maneuver, there was a 1.5 x 0. 5 cm skin defect.  Primary closure was not feasible, so a subciliary incision was made for 1.5 cm laterally.  The underlying skin and orbicularis was raised with Wescott scissors and wide  undermining.  The flap was advanced into the defect and secured to the medial canthal tendon using 6-0 Vicryl suture interrupted buried deep.  The orbicularis was closed with 6-0 Vicryl interrupted buried deep.  The skin  was closed with 6-0 plain gut suture in interrupted and running fashion.  Maxitrol eyedrops and ointment was placed in the operative eye(s) and incision.   The patient was then taken to the recovery room in stable condition.   Dairl Ponder, MD, PhD Ophthalmic Plastic and Reconstructive Surgery [facility] [phone] (office)

## 2023-05-04 NOTE — Transfer of Care (Signed)
Immediate Anesthesia Transfer of Care Note  Patient: Hailey Wright  Procedure(s) Performed: PLASTIC REPAIR OF CANALICULAR;  NASOLACRIMAL PROBE WITH TUBE OR STENT (Right) PUNCTOPLASTY (Right) LID LESION EXCISION (Right: Eye) TEAR DUCT PROBING (Right) ADJACENT TISSUE TRANSFER/TISSUE REARRANGEMENT (Right) INTERMEDIATE WOUND CLOSURE MEDIAL CANTHOPLASTY (Right) MINOR EXCISION OF LESION 14440 (Right)  Patient Location: PACU  Anesthesia Type:MAC  Level of Consciousness: awake, alert , and oriented  Airway & Oxygen Therapy: Patient Spontanous Breathing  Post-op Assessment: Report given to RN and Post -op Vital signs reviewed and stable  Post vital signs: Reviewed and stable  Last Vitals:  Vitals Value Taken Time  BP    Temp    Pulse 58 05/04/23 1512  Resp 15 05/04/23 1512  SpO2 97 % 05/04/23 1512  Vitals shown include unvalidated device data.  Last Pain:  Vitals:   05/04/23 1116  TempSrc:   PainSc: 0-No pain         Complications: No notable events documented.

## 2023-05-05 ENCOUNTER — Encounter (HOSPITAL_COMMUNITY): Payer: Self-pay | Admitting: Optometry

## 2023-05-05 NOTE — Anesthesia Postprocedure Evaluation (Signed)
Anesthesia Post Note  Patient: Hailey Wright  Procedure(s) Performed: PLASTIC REPAIR OF CANALICULAR;  NASOLACRIMAL PROBE WITH TUBE OR STENT (Right: Eye) PUNCTOPLASTY (Right: Eye) LID LESION EXCISION (Right: Eye) TEAR DUCT PROBING (Right) ADJACENT TISSUE TRANSFER/TISSUE REARRANGEMENT (Right) INTERMEDIATE WOUND CLOSURE MEDIAL CANTHOPLASTY (Right) MINOR EXCISION OF LESION 14440 (Right)     Patient location during evaluation: PACU Anesthesia Type: MAC Level of consciousness: awake and alert Pain management: pain level controlled Vital Signs Assessment: post-procedure vital signs reviewed and stable Respiratory status: spontaneous breathing, nonlabored ventilation and respiratory function stable Cardiovascular status: stable and blood pressure returned to baseline Postop Assessment: no apparent nausea or vomiting Anesthetic complications: no  No notable events documented.  Last Vitals:  Vitals:   05/04/23 1600 05/04/23 1612  BP: (!) 99/54 (!) 105/58  Pulse: (!) 59 60  Resp: 15 (!) 23  Temp:  36.5 C  SpO2: 97% 98%    Last Pain:  Vitals:   05/04/23 1612  TempSrc:   PainSc: 0-No pain                 Elizar Alpern,W. EDMOND

## 2023-05-08 ENCOUNTER — Ambulatory Visit: Payer: Medicare Other | Attending: Cardiology

## 2023-05-08 DIAGNOSIS — Z95810 Presence of automatic (implantable) cardiac defibrillator: Secondary | ICD-10-CM

## 2023-05-08 DIAGNOSIS — I5022 Chronic systolic (congestive) heart failure: Secondary | ICD-10-CM | POA: Diagnosis not present

## 2023-05-08 LAB — SURGICAL PATHOLOGY

## 2023-05-09 NOTE — Progress Notes (Signed)
EPIC Encounter for ICM Monitoring  Patient Name: Hailey Wright is a 77 y.o. female Date: 05/09/2023 Primary Care Physican: Garlan Fillers, MD Primary Cardiologist: Hochrein Electrophysiologist: Elberta Fortis 07/28/2022 Weight: 142 lbs 01/25/2023/2024 Weight: 140.4 lbs 04/05/2023 Weight: 140 lbs 05/09/2023 Weight: 142 lbs   Spoke with patient and heart failure questions reviewed.  Transmission results reviewed.  Pt reported weight gain last week and took 20 mg Furosemide which resulted in possible dryness.  She felt lightheaded on 6/15 and BP dropped to 80/40.  Advised to maintain hydration even if taking PRN Furosemide to help prevent dehydration.    Corvue thoracic impedance suggesting possible dryness starting 6/14.  Thoracic impedance suggesting possible fluid accumulation 5/28-6/6.    Prescribed:  Furosemide 40 mg 1 tablet daily as needed.  She takes 0.5 tablet as needed since 40 mg tends to make her legs cramp.  Potassium 20 mEq take 1 tablet as needed when taking Lasix Jardiance 10 mg take 1 tablet by mouth daily before breakfast.   Labs: 05/26/2022 Creatinine 1.03, BUN 21, Potassium 4.2, Sodium 138, GFR 57 A complete set of results can be found in Results Review.   Recommendations:  Advised to drink 64 oz daily to stay hydrated.      Follow-up plan: ICM clinic phone appointment on 05/15/2023 to recheck fluid levels.   91 day device clinic remote transmission 06/05/2023.     EP/Cardiology Office Visit:   Recall 01/05/2024 with Dr. Antoine Poche.  Recall 09/16/2023 with Dr Elberta Fortis.   Copy of ICM check sent to Dr. Elberta Fortis.  3 month ICM trend: 05/08/2023.    12-14 Month ICM trend:     Karie Soda, RN 05/09/2023 4:30 PM

## 2023-05-15 ENCOUNTER — Ambulatory Visit: Payer: Medicare Other | Attending: Cardiology

## 2023-05-15 ENCOUNTER — Encounter (HOSPITAL_COMMUNITY): Payer: Self-pay | Admitting: Optometry

## 2023-05-15 DIAGNOSIS — Z95810 Presence of automatic (implantable) cardiac defibrillator: Secondary | ICD-10-CM

## 2023-05-15 DIAGNOSIS — I5022 Chronic systolic (congestive) heart failure: Secondary | ICD-10-CM

## 2023-05-17 NOTE — Progress Notes (Signed)
EPIC Encounter for ICM Monitoring  Patient Name: Hailey Wright is a 77 y.o. female Date: 05/17/2023 Primary Care Physican: Garlan Fillers, MD Primary Cardiologist: Hochrein Electrophysiologist: Elberta Fortis 07/28/2022 Weight: 142 lbs 01/25/2023/2024 Weight: 140.4 lbs 04/05/2023 Weight: 140 lbs 05/09/2023 Weight: 142 lbs   Attempted call to patient and unable to reach.  Left detailed message per DPR regarding transmission. Transmission reviewed.    Corvue thoracic impedance suggesting fluid levels returned to normal after possible dryness from 6/14-6/22.      Prescribed:  Furosemide 40 mg 1 tablet daily as needed.  She takes 0.5 tablet as needed since 40 mg tends to make her legs cramp.  Potassium 20 mEq take 1 tablet as needed when taking Lasix Jardiance 10 mg take 1 tablet by mouth daily before breakfast.   Labs: 05/26/2022 Creatinine 1.03, BUN 21, Potassium 4.2, Sodium 138, GFR 57 A complete set of results can be found in Results Review.   Recommendations:  Left voice mail with ICM number and encouraged to call if experiencing any fluid symptoms.   Follow-up plan: ICM clinic phone appointment on 06/12/2023.   91 day device clinic remote transmission 06/05/2023.     EP/Cardiology Office Visit:   Recall 01/05/2024 with Dr. Antoine Poche.  Recall 09/16/2023 with Dr Elberta Fortis.   Copy of ICM check sent to Dr. Elberta Fortis.  3 month ICM trend: 05/15/2023.    12-14 Month ICM trend:     Karie Soda, RN 05/17/2023 11:57 AM

## 2023-06-05 ENCOUNTER — Ambulatory Visit (INDEPENDENT_AMBULATORY_CARE_PROVIDER_SITE_OTHER): Payer: Medicare Other

## 2023-06-05 DIAGNOSIS — I255 Ischemic cardiomyopathy: Secondary | ICD-10-CM

## 2023-06-05 DIAGNOSIS — I5022 Chronic systolic (congestive) heart failure: Secondary | ICD-10-CM

## 2023-06-05 LAB — CUP PACEART REMOTE DEVICE CHECK
Battery Remaining Longevity: 31 mo
Battery Remaining Percentage: 31 %
Battery Voltage: 2.84 V
Brady Statistic AP VP Percent: 1 %
Brady Statistic AP VS Percent: 1.6 %
Brady Statistic AS VP Percent: 1 %
Brady Statistic AS VS Percent: 98 %
Brady Statistic RA Percent Paced: 1 %
Brady Statistic RV Percent Paced: 1 %
Date Time Interrogation Session: 20240715020015
HighPow Impedance: 73 Ohm
HighPow Impedance: 73 Ohm
Implantable Lead Connection Status: 753985
Implantable Lead Connection Status: 753985
Implantable Lead Implant Date: 20170727
Implantable Lead Implant Date: 20170727
Implantable Lead Location: 753859
Implantable Lead Location: 753860
Implantable Pulse Generator Implant Date: 20170727
Lead Channel Impedance Value: 380 Ohm
Lead Channel Impedance Value: 380 Ohm
Lead Channel Pacing Threshold Amplitude: 0.5 V
Lead Channel Pacing Threshold Amplitude: 1.25 V
Lead Channel Pacing Threshold Pulse Width: 0.5 ms
Lead Channel Pacing Threshold Pulse Width: 0.5 ms
Lead Channel Sensing Intrinsic Amplitude: 12 mV
Lead Channel Sensing Intrinsic Amplitude: 3.5 mV
Lead Channel Setting Pacing Amplitude: 2 V
Lead Channel Setting Pacing Amplitude: 2.5 V
Lead Channel Setting Pacing Pulse Width: 0.5 ms
Lead Channel Setting Sensing Sensitivity: 0.5 mV
Pulse Gen Serial Number: 7261095
Zone Setting Status: 755011

## 2023-06-12 ENCOUNTER — Ambulatory Visit: Payer: Medicare Other | Attending: Cardiology

## 2023-06-12 DIAGNOSIS — I5022 Chronic systolic (congestive) heart failure: Secondary | ICD-10-CM | POA: Diagnosis not present

## 2023-06-12 DIAGNOSIS — Z95810 Presence of automatic (implantable) cardiac defibrillator: Secondary | ICD-10-CM | POA: Diagnosis not present

## 2023-06-14 NOTE — Progress Notes (Signed)
EPIC Encounter for ICM Monitoring  Patient Name: Hailey Wright is a 77 y.o. female Date: 06/14/2023 Primary Care Physican: Garlan Fillers, MD Primary Cardiologist: Hochrein Electrophysiologist: Elberta Fortis 07/28/2022 Weight: 142 lbs 01/25/2023/2024 Weight: 140.4 lbs 04/05/2023 Weight: 140 lbs 05/09/2023 Weight: 142 lbs   Spoke with patient and heart failure questions reviewed.  Transmission results reviewed.  Pt asymptomatic for fluid accumulation.  Reports feeling well at this time and voices no complaints.      Corvue thoracic impedance suggesting normal fluid levels with the exception of possible fluid accumulation from 6/29-7/5.      Prescribed:  Furosemide 40 mg 1 tablet daily as needed.  She takes 0.5 tablet as needed since 40 mg tends to make her legs cramp.  Potassium 20 mEq take 1 tablet as needed when taking Lasix Jardiance 10 mg take 1 tablet by mouth daily before breakfast.   Labs: 05/04/2023 Creatinine 0.90, BUN 15, Potassium 3.7, Sodium 140, GFR >60 A complete set of results can be found in Results Review.   Recommendations:  No changes and encouraged to call if experiencing any fluid symptoms.   Follow-up plan: ICM clinic phone appointment on 07/17/2023.   91 day device clinic remote transmission 09/04/2023.     EP/Cardiology Office Visit:   Recall 01/05/2024 with Dr. Antoine Poche.  Recall 09/16/2023 with Dr Elberta Fortis.   Copy of ICM check sent to Dr. Elberta Fortis.  3 month ICM trend: 06/12/2023.    12-14 Month ICM trend:     Karie Soda, RN 06/14/2023 7:17 AM

## 2023-06-19 NOTE — Progress Notes (Signed)
Remote ICD transmission.   

## 2023-07-17 ENCOUNTER — Ambulatory Visit: Payer: Medicare Other

## 2023-07-17 DIAGNOSIS — Z95810 Presence of automatic (implantable) cardiac defibrillator: Secondary | ICD-10-CM

## 2023-07-17 DIAGNOSIS — I5022 Chronic systolic (congestive) heart failure: Secondary | ICD-10-CM

## 2023-07-18 NOTE — Progress Notes (Addendum)
EPIC Encounter for ICM Monitoring  Patient Name: Hailey Wright is a 77 y.o. female Date: 07/18/2023 Primary Care Physican: Garlan Fillers, MD Primary Cardiologist: Hochrein Electrophysiologist: Elberta Fortis 07/28/2022 Weight: 142 lbs 01/25/2023/2024 Weight: 140.4 lbs 04/05/2023 Weight: 140 lbs 05/09/2023 Weight: 142 lbs   Spoke with patient and heart failure questions reviewed.  Transmission results reviewed.  Pt asymptomatic for fluid accumulation.  Reports feeling well at this time and voices no complaints.      Corvue thoracic impedance suggesting possible fluid accumulation from 8/19-8/25 and returned to baseline 8/26.      Prescribed:  Furosemide 40 mg 1 tablet daily as needed.  She takes 0.5 tablet as needed since 40 mg tends to make her legs cramp.  Potassium 20 mEq take 1 tablet as needed when taking Lasix Jardiance 10 mg take 1 tablet by mouth daily before breakfast.   Labs: 05/04/2023 Creatinine 0.90, BUN 15, Potassium 3.7, Sodium 140, GFR >60 A complete set of results can be found in Results Review.   Recommendations:  No changes and encouraged to call if experiencing any fluid symptoms.   Follow-up plan: ICM clinic phone appointment on 08/28/2023.   91 day device clinic remote transmission 09/04/2023.     EP/Cardiology Office Visit:   Recall 01/05/2024 with Dr. Antoine Poche.  09/18/2023 with Otilio Saber, PA.   Copy of ICM check sent to Dr. Elberta Fortis.  3 month ICM trend: 07/17/2023.    12-14 Month ICM trend:     Karie Soda, RN 07/18/2023 3:05 PM

## 2023-08-28 ENCOUNTER — Ambulatory Visit: Payer: Medicare Other | Attending: Cardiology

## 2023-08-28 DIAGNOSIS — Z95810 Presence of automatic (implantable) cardiac defibrillator: Secondary | ICD-10-CM

## 2023-08-28 DIAGNOSIS — I5022 Chronic systolic (congestive) heart failure: Secondary | ICD-10-CM

## 2023-09-01 ENCOUNTER — Telehealth: Payer: Self-pay

## 2023-09-01 NOTE — Telephone Encounter (Signed)
Remote ICM transmission received.  Attempted call to patient regarding ICM remote transmission and left detailed message per DPR.  Left ICM phone number and advised to return call for any fluid symptoms or questions. Next ICM remote transmission scheduled 10/02/2023.

## 2023-09-01 NOTE — Progress Notes (Signed)
EPIC Encounter for ICM Monitoring  Patient Name: Hailey Wright is a 77 y.o. female Date: 09/01/2023 Primary Care Physican: Garlan Fillers, MD Primary Cardiologist: Hochrein Electrophysiologist: Elberta Fortis 07/28/2022 Weight: 142 lbs 01/25/2023/2024 Weight: 140.4 lbs 04/05/2023 Weight: 140 lbs 05/09/2023 Weight: 142 lbs   Attempted call to patient and unable to reach.  Left detailed message per DPR regarding transmission. Transmission reviewed.      Corvue thoracic impedance suggesting normal fluid levels within the last month    Prescribed:  Furosemide 40 mg 1 tablet daily as needed.  She takes 0.5 tablet as needed since 40 mg tends to make her legs cramp.  Potassium 20 mEq take 1 tablet as needed when taking Lasix Jardiance 10 mg take 1 tablet by mouth daily before breakfast.   Labs: 05/04/2023 Creatinine 0.90, BUN 15, Potassium 3.7, Sodium 140, GFR >60 A complete set of results can be found in Results Review.   Recommendations:  Left voice mail with ICM number and encouraged to call if experiencing any fluid symptoms.   Follow-up plan: ICM clinic phone appointment on 10/02/2023.   91 day device clinic remote transmission 12/04/2023.     EP/Cardiology Office Visit:   Recall 01/05/2024 with Dr. Antoine Poche.  09/18/2023 with Otilio Saber, PA.   Copy of ICM check sent to Dr. Elberta Fortis.   3 month ICM trend: 08/28/2023.    12-14 Month ICM trend:     Karie Soda, RN 09/01/2023 1:42 PM

## 2023-09-04 ENCOUNTER — Ambulatory Visit: Payer: Medicare Other | Attending: Cardiology

## 2023-09-04 DIAGNOSIS — I255 Ischemic cardiomyopathy: Secondary | ICD-10-CM | POA: Diagnosis not present

## 2023-09-04 DIAGNOSIS — I5022 Chronic systolic (congestive) heart failure: Secondary | ICD-10-CM

## 2023-09-06 LAB — CUP PACEART REMOTE DEVICE CHECK
Battery Remaining Longevity: 28 mo
Battery Remaining Percentage: 29 %
Battery Voltage: 2.83 V
Brady Statistic AP VP Percent: 1 %
Brady Statistic AP VS Percent: 1 %
Brady Statistic AS VP Percent: 1 %
Brady Statistic AS VS Percent: 98 %
Brady Statistic RA Percent Paced: 1 %
Brady Statistic RV Percent Paced: 1 %
Date Time Interrogation Session: 20241014020017
HighPow Impedance: 81 Ohm
HighPow Impedance: 81 Ohm
Implantable Lead Connection Status: 753985
Implantable Lead Connection Status: 753985
Implantable Lead Implant Date: 20170727
Implantable Lead Implant Date: 20170727
Implantable Lead Location: 753859
Implantable Lead Location: 753860
Implantable Pulse Generator Implant Date: 20170727
Lead Channel Impedance Value: 380 Ohm
Lead Channel Impedance Value: 390 Ohm
Lead Channel Pacing Threshold Amplitude: 0.5 V
Lead Channel Pacing Threshold Amplitude: 1.25 V
Lead Channel Pacing Threshold Pulse Width: 0.5 ms
Lead Channel Pacing Threshold Pulse Width: 0.5 ms
Lead Channel Sensing Intrinsic Amplitude: 12 mV
Lead Channel Sensing Intrinsic Amplitude: 3.4 mV
Lead Channel Setting Pacing Amplitude: 2 V
Lead Channel Setting Pacing Amplitude: 2.5 V
Lead Channel Setting Pacing Pulse Width: 0.5 ms
Lead Channel Setting Sensing Sensitivity: 0.5 mV
Pulse Gen Serial Number: 7261095
Zone Setting Status: 755011

## 2023-09-18 ENCOUNTER — Ambulatory Visit: Payer: Medicare Other | Attending: Student | Admitting: Student

## 2023-09-18 ENCOUNTER — Encounter: Payer: Self-pay | Admitting: Student

## 2023-09-18 VITALS — BP 102/64 | HR 79 | Ht 66.0 in | Wt 145.0 lb

## 2023-09-18 DIAGNOSIS — I255 Ischemic cardiomyopathy: Secondary | ICD-10-CM | POA: Insufficient documentation

## 2023-09-18 DIAGNOSIS — E785 Hyperlipidemia, unspecified: Secondary | ICD-10-CM | POA: Diagnosis not present

## 2023-09-18 DIAGNOSIS — I2510 Atherosclerotic heart disease of native coronary artery without angina pectoris: Secondary | ICD-10-CM | POA: Diagnosis not present

## 2023-09-18 DIAGNOSIS — I5022 Chronic systolic (congestive) heart failure: Secondary | ICD-10-CM | POA: Insufficient documentation

## 2023-09-18 DIAGNOSIS — Z95810 Presence of automatic (implantable) cardiac defibrillator: Secondary | ICD-10-CM | POA: Insufficient documentation

## 2023-09-18 LAB — CUP PACEART INCLINIC DEVICE CHECK
Battery Remaining Longevity: 30 mo
Brady Statistic RA Percent Paced: 0.29 %
Brady Statistic RV Percent Paced: 0 %
Date Time Interrogation Session: 20241028131018
HighPow Impedance: 82.125
Implantable Lead Connection Status: 753985
Implantable Lead Connection Status: 753985
Implantable Lead Implant Date: 20170727
Implantable Lead Implant Date: 20170727
Implantable Lead Location: 753859
Implantable Lead Location: 753860
Implantable Pulse Generator Implant Date: 20170727
Lead Channel Impedance Value: 425 Ohm
Lead Channel Impedance Value: 425 Ohm
Lead Channel Pacing Threshold Amplitude: 0.5 V
Lead Channel Pacing Threshold Amplitude: 0.5 V
Lead Channel Pacing Threshold Amplitude: 1 V
Lead Channel Pacing Threshold Amplitude: 1 V
Lead Channel Pacing Threshold Pulse Width: 0.5 ms
Lead Channel Pacing Threshold Pulse Width: 0.5 ms
Lead Channel Pacing Threshold Pulse Width: 0.5 ms
Lead Channel Pacing Threshold Pulse Width: 0.5 ms
Lead Channel Sensing Intrinsic Amplitude: 12 mV
Lead Channel Sensing Intrinsic Amplitude: 5 mV
Lead Channel Setting Pacing Amplitude: 2 V
Lead Channel Setting Pacing Amplitude: 2.5 V
Lead Channel Setting Pacing Pulse Width: 0.5 ms
Lead Channel Setting Sensing Sensitivity: 0.5 mV
Pulse Gen Serial Number: 7261095
Zone Setting Status: 755011

## 2023-09-18 NOTE — Progress Notes (Signed)
  Electrophysiology Office Note:   ID:  Hailey, Wright [birth year], MRN [id number]  Primary Cardiologist: Rollene Rotunda, MD Electrophysiologist: Will Jorja Loa, MD      History of Present Illness:   Hailey Wright is a 77 y.o. female with h/o CAD s/p CABG, CHF, h/o DVT, HLD, HTN, ICM, and ICD in situ seen today for routine electrophysiology followup.   Since last being seen in our clinic the patient reports doing well.  she denies chest pain, palpitations, dyspnea, PND, orthopnea, nausea, vomiting, dizziness, syncope, edema, weight gain, or early satiety.   Review of systems complete and found to be negative unless listed in HPI.   EP Information / Studies Reviewed:    EKG is ordered today. Personal review as below.  EKG Interpretation Date/Time:  Monday September 18 2023 11:52:44 EDT Ventricular Rate:  79 PR Interval:  154 QRS Duration:  146 QT Interval:  404 QTC Calculation: 463 R Axis:   57  Text Interpretation: Normal sinus rhythm Possible Left atrial enlargement Left bundle branch block Confirmed by Maxine Glenn [phone]) on 09/18/2023 11:56:52 AM    ICD Interrogation-  reviewed in detail today,  See PACEART report.  Device History: Abbott Dual Chamber ICD implanted 05/2016 for CHF  Physical Exam:   VS:  BP 102/64   Pulse 79   Ht 5\' 6"  (1.676 m)   Wt 145 lb (65.8 kg)   LMP  (LMP Unknown)   SpO2 98%   BMI 23.40 kg/m    Wt Readings from Last 3 Encounters:  09/18/23 145 lb (65.8 kg)  05/04/23 142 lb (64.4 kg)  02/02/23 143 lb 9.6 oz (65.1 kg)     GEN: Well nourished, well developed in no acute distress NECK: No JVD; No carotid bruits CARDIAC: Regular rate and rhythm, no murmurs, rubs, gallops RESPIRATORY:  Clear to auscultation without rales, wheezing or rhonchi  ABDOMEN: Soft, non-tender, non-distended EXTREMITIES:  No edema; No deformity   ASSESSMENT AND PLAN:    Chronic systolic dysfunction s/p Abbott dual chamber ICD  Echo 11/18/2021 LVEF  30-35% euvolemic today Stable on an appropriate medical regimen Normal ICD function See Pace Art report No changes today  She has never had ICD therapy, and is not sure she would want gen change once at [facility]. Will continue to follow. Would plan to update Echo prior to then (~2-2.5 years)  CAD s/p CABG Denies s/s ischemia  HLD Continue statin   LBBB Has remained stable If becomes symptomatic from HF, may need to consider BiV upgrade  Disposition:   Follow up with Dr. Elberta Fortis in 12 months   Signed, Graciella Freer, PA-C

## 2023-09-18 NOTE — Patient Instructions (Signed)
Medication Instructions:  Your physician recommends that you continue on your current medications as directed. Please refer to the Current Medication list given to you today.  *If you need a refill on your cardiac medications before your next appointment, please call your pharmacy*  Lab Work: BMET-TODAY If you have labs (blood work) drawn today and your tests are completely normal, you will receive your results only by: MyChart Message (if you have MyChart) OR A paper copy in the mail If you have any lab test that is abnormal or we need to change your treatment, we will call you to review the results.  Follow-Up: At [facility], you and your health needs are our priority.  As part of our continuing mission to provide you with exceptional heart care, we have created designated Provider Care Teams.  These Care Teams include your primary Cardiologist (physician) and Advanced Practice Providers (APPs -  Physician Assistants and Nurse Practitioners) who all work together to provide you with the care you need, when you need it.  Your next appointment:   1 year(s)  Provider:   Loman Brooklyn, MD

## 2023-09-19 ENCOUNTER — Ambulatory Visit (INDEPENDENT_AMBULATORY_CARE_PROVIDER_SITE_OTHER): Payer: Medicare Other | Admitting: Podiatry

## 2023-09-19 ENCOUNTER — Encounter: Payer: Self-pay | Admitting: Podiatry

## 2023-09-19 ENCOUNTER — Ambulatory Visit (INDEPENDENT_AMBULATORY_CARE_PROVIDER_SITE_OTHER): Payer: Medicare Other

## 2023-09-19 DIAGNOSIS — R52 Pain, unspecified: Secondary | ICD-10-CM

## 2023-09-19 DIAGNOSIS — M7741 Metatarsalgia, right foot: Secondary | ICD-10-CM

## 2023-09-19 DIAGNOSIS — M2042 Other hammer toe(s) (acquired), left foot: Secondary | ICD-10-CM

## 2023-09-19 DIAGNOSIS — M7751 Other enthesopathy of right foot: Secondary | ICD-10-CM

## 2023-09-19 DIAGNOSIS — M2041 Other hammer toe(s) (acquired), right foot: Secondary | ICD-10-CM | POA: Diagnosis not present

## 2023-09-19 DIAGNOSIS — M21619 Bunion of unspecified foot: Secondary | ICD-10-CM | POA: Diagnosis not present

## 2023-09-19 LAB — BASIC METABOLIC PANEL
BUN/Creatinine Ratio: 21 (ref 12–28)
BUN: 18 mg/dL (ref 8–27)
CO2: 27 mmol/L (ref 20–29)
Calcium: 9.8 mg/dL (ref 8.7–10.3)
Chloride: 102 mmol/L (ref 96–106)
Creatinine, Ser: 0.86 mg/dL (ref 0.57–1.00)
Glucose: 88 mg/dL (ref 70–99)
Potassium: 4.8 mmol/L (ref 3.5–5.2)
Sodium: 144 mmol/L (ref 134–144)
eGFR: 70 mL/min/{1.73_m2} (ref >59)

## 2023-09-19 MED ORDER — DICLOFENAC SODIUM 1 % EX GEL: 2.0000 g | Freq: Four times a day (QID) | CUTANEOUS | 2 refills | Status: DC

## 2023-09-19 NOTE — Progress Notes (Signed)
  Subjective:  Patient ID: Hailey Wright, female    DOB: 06/16/1946,  MRN: [id number]  Chief Complaint  Patient presents with   Toe Pain    Pt presents for pain in 2nd toe on the right that has been going on for while now.    Discussed the use of AI scribe software for clinical note transcription with the patient, who gave verbal consent to proceed.  History of Present Illness         Chief Complaint  Patient presents with   Toe Pain    Pt presents for pain in 2nd toe on the right that has been going on for while now.    There is-year-old female presents with chronic right second toe pain, which was particularly severe the previous week. They describe the pain as being located at the base of the toe and the ball of the foot, along the 2nd MTPJ. The pain is so severe that it has caused them to limp. They have been using a shoe insert and a pad on the affected toe, which provides some relief. They also mention that they have been wearing shoes with a large box to avoid squeezing their toes. The patient has a history of a heart attack and open heart surgery in 2017. They also mention having a bunion and hammer toes. The pain has improved some this week.  And discomfort to the second MTPJ.   Objective:    Physical Exam         General: AAO x3, NAD  Dermatological: Skin is warm, dry and supple bilateral. There are no open sores, no preulcerative lesions, no rash or signs of infection present.  Vascular: Dorsalis Pedis artery and Posterior Tibial artery pedal pulses are 2/4 bilateral with immedate capillary fill time.  There is no pain with calf compression, swelling, warmth, erythema.   Neruologic: Grossly intact via light touch bilateral.   Musculoskeletal: Bunion, hammertoes are present.  The majority of discomfort is localized along the second MTPJ.  There is no skin edema, erythema.  There is no area pinpoint tenderness identified otherwise.  MMT 5/5.   No images are attached to  the encounter.    Results          Assessment:   Capsulitis, hammertoe/bunion deformity  Plan:  Patient was evaluated and treated and all questions answered.  Assessment and Plan          Metatarsalgia Chronic pain in the right second toe, localized to the metatarsophalangeal joint. Likely due to a combination of factors including loss of fat pad, bunion, and mechanical foot structure. -Administered a steroid injection into the joint today to reduce inflammation.  Verbal consent obtained.  Mixture of 0.5 cc assessment of phosphate, 0.5 cc of Marcaine plain was infiltrated into the second MTPJ without complications.  Postinjection care discussed.  Follow-up. -Prescribe Voltaren gel for topical anti-inflammatory use. -Consider custom orthotic to redistribute weight and offload pressure from the affected area. She was measured by Nicki Guadalajara for inserts today.  -Follow-up in six weeks to assess response to treatment.    No follow-ups on file.   Vivi Barrack DPM

## 2023-09-19 NOTE — Progress Notes (Signed)
Remote ICD transmission.   

## 2023-09-19 NOTE — Patient Instructions (Signed)

## 2023-09-19 NOTE — Progress Notes (Signed)
Orthotic eval   Patient was seen, measured / scanned for custom molded foot orthotics.  Patient will benefit from CFO's as they will help provide total contact to MLA's helping to better distribute body weight across BIL feet greater reducing plantar pressure and pain and to also encourage FF and RF alignment.  Patient was scanned items to be ordered and fit when in  [employer], CFo, Cfm

## 2023-10-02 ENCOUNTER — Ambulatory Visit: Payer: Medicare Other | Attending: Cardiology

## 2023-10-02 DIAGNOSIS — I5022 Chronic systolic (congestive) heart failure: Secondary | ICD-10-CM

## 2023-10-02 DIAGNOSIS — Z95810 Presence of automatic (implantable) cardiac defibrillator: Secondary | ICD-10-CM

## 2023-10-06 ENCOUNTER — Telehealth: Payer: Self-pay

## 2023-10-06 NOTE — Telephone Encounter (Signed)
 Remote ICM transmission received.  Attempted call to patient regarding ICM remote transmission and left detailed message per DPR.  Left ICM phone number and advised to return call for any fluid symptoms or questions. Next ICM remote transmission scheduled 11/20/2023.

## 2023-10-06 NOTE — Progress Notes (Signed)
EPIC Encounter for ICM Monitoring  Patient Name: Hailey Wright is a 77 y.o. female Date: 10/06/2023 Primary Care Physican: Garlan Fillers, MD Primary Cardiologist: Hochrein Electrophysiologist: Elberta Fortis 07/28/2022 Weight: 142 lbs 01/25/2023/2024 Weight: 140.4 lbs 04/05/2023 Weight: 140 lbs 05/09/2023 Weight: 142 lbs   Attempted call to patient and unable to reach.  Left detailed message per DPR regarding transmission. Transmission reviewed.      Corvue thoracic impedance suggesting normal fluid levels within the last month with the exception of possible fluid accumulation from 10/31-11/5.   Prescribed:  Furosemide 40 mg 1 tablet daily as needed.  She takes 0.5 tablet as needed since 40 mg tends to make her legs cramp.  Potassium 20 mEq take 1 tablet as needed when taking Lasix Jardiance 10 mg take 1 tablet by mouth daily before breakfast.   Labs: 05/04/2023 Creatinine 0.90, BUN 15, Potassium 3.7, Sodium 140, GFR >60 A complete set of results can be found in Results Review.   Recommendations:  Left voice mail with ICM number and encouraged to call if experiencing any fluid symptoms.   Follow-up plan: ICM clinic phone appointment on 11/20/2023.   91 day device clinic remote transmission 12/04/2023.     EP/Cardiology Office Visit:   Recall 01/05/2024 with Dr. Antoine Poche.  Recall 09/12/2024 with Dr Elberta Fortis.   Copy of ICM check sent to Dr. Elberta Fortis.  3 month ICM trend: 10/02/2023.    12-14 Month ICM trend:     Karie Soda, RN 10/06/2023 11:59 AM

## 2023-10-17 DIAGNOSIS — Z1231 Encounter for screening mammogram for malignant neoplasm of breast: Secondary | ICD-10-CM | POA: Diagnosis not present

## 2023-10-25 ENCOUNTER — Encounter: Payer: Self-pay | Admitting: Podiatry

## 2023-10-30 ENCOUNTER — Encounter: Payer: Self-pay | Admitting: Podiatry

## 2023-10-30 ENCOUNTER — Ambulatory Visit (INDEPENDENT_AMBULATORY_CARE_PROVIDER_SITE_OTHER): Payer: Medicare Other | Admitting: Podiatry

## 2023-10-30 DIAGNOSIS — M21619 Bunion of unspecified foot: Secondary | ICD-10-CM | POA: Diagnosis not present

## 2023-10-30 DIAGNOSIS — M2041 Other hammer toe(s) (acquired), right foot: Secondary | ICD-10-CM | POA: Diagnosis not present

## 2023-10-30 DIAGNOSIS — M2042 Other hammer toe(s) (acquired), left foot: Secondary | ICD-10-CM

## 2023-10-30 DIAGNOSIS — M7751 Other enthesopathy of right foot: Secondary | ICD-10-CM | POA: Diagnosis not present

## 2023-10-30 NOTE — Progress Notes (Signed)
Subjective: Chief Complaint  Patient presents with   Foot Pain    RM#11 Right big toe starting to curl feels pressure under big toe   77 year old female presents the office for above concerns.  She states the injection helped minimally.  Skin pain to the ball of her right foot but also notes her second toes curling and rubbing.  No recent injury or changes otherwise.  Also presents with a bit of orthotics.  Objective: AAO x3, NAD DP/PT pulses palpable bilaterally, CRT less than 3 seconds Tenderness mostly submetatarsal 2 on the right foot.  Hammertoe contractures present and second digit and has mild erythema on the dorsal PIPJ from irritation but no skin breakdown or warmth.  Bunion is present.  No areas pinpoint tenderness.  No other areas of discomfort. No pain with calf compression, swelling, warmth, erythema  Assessment: Capsulitis, hammertoe, bunion  Plan: -All treatment options discussed with the patient including all alternatives, risks, complications.  -Discussed the conservative as well as surgical treatment options. -She is going to continue with Voltaren gel in the toe.  Orthotics were dispensed today and instructions were provided to the patient and how to break them in.  -Offloading for the hammertoe.  -Patient encouraged to call the office with any questions, concerns, change in symptoms.   Vivi Barrack DPM

## 2023-11-20 ENCOUNTER — Ambulatory Visit: Payer: Medicare Other | Attending: Cardiology

## 2023-11-20 DIAGNOSIS — Z95810 Presence of automatic (implantable) cardiac defibrillator: Secondary | ICD-10-CM

## 2023-11-20 DIAGNOSIS — I5022 Chronic systolic (congestive) heart failure: Secondary | ICD-10-CM | POA: Diagnosis not present

## 2023-12-04 ENCOUNTER — Ambulatory Visit (INDEPENDENT_AMBULATORY_CARE_PROVIDER_SITE_OTHER): Payer: Medicare Other

## 2023-12-04 DIAGNOSIS — I5022 Chronic systolic (congestive) heart failure: Secondary | ICD-10-CM

## 2023-12-04 DIAGNOSIS — I255 Ischemic cardiomyopathy: Secondary | ICD-10-CM | POA: Diagnosis not present

## 2023-12-04 LAB — CUP PACEART REMOTE DEVICE CHECK
Battery Remaining Longevity: 26 mo
Battery Remaining Percentage: 27 %
Battery Voltage: 2.81 V
Brady Statistic AP VP Percent: 1 %
Brady Statistic AP VS Percent: 1 %
Brady Statistic AS VP Percent: 1 %
Brady Statistic AS VS Percent: 98 %
Brady Statistic RA Percent Paced: 1 %
Brady Statistic RV Percent Paced: 1 %
Date Time Interrogation Session: 20250113020016
HighPow Impedance: 74 Ohm
HighPow Impedance: 74 Ohm
Implantable Lead Connection Status: 753985
Implantable Lead Connection Status: 753985
Implantable Lead Implant Date: 20170727
Implantable Lead Implant Date: 20170727
Implantable Lead Location: 753859
Implantable Lead Location: 753860
Implantable Pulse Generator Implant Date: 20170727
Lead Channel Impedance Value: 360 Ohm
Lead Channel Impedance Value: 400 Ohm
Lead Channel Pacing Threshold Amplitude: 0.5 V
Lead Channel Pacing Threshold Amplitude: 1 V
Lead Channel Pacing Threshold Pulse Width: 0.5 ms
Lead Channel Pacing Threshold Pulse Width: 0.5 ms
Lead Channel Sensing Intrinsic Amplitude: 12 mV
Lead Channel Sensing Intrinsic Amplitude: 3.6 mV
Lead Channel Setting Pacing Amplitude: 2 V
Lead Channel Setting Pacing Amplitude: 2.5 V
Lead Channel Setting Pacing Pulse Width: 0.5 ms
Lead Channel Setting Sensing Sensitivity: 0.5 mV
Pulse Gen Serial Number: 7261095
Zone Setting Status: 755011

## 2023-12-12 ENCOUNTER — Encounter: Payer: Self-pay | Admitting: Podiatry

## 2023-12-12 ENCOUNTER — Ambulatory Visit (INDEPENDENT_AMBULATORY_CARE_PROVIDER_SITE_OTHER): Payer: Medicare Other | Admitting: Podiatry

## 2023-12-12 DIAGNOSIS — M21619 Bunion of unspecified foot: Secondary | ICD-10-CM

## 2023-12-12 DIAGNOSIS — M2042 Other hammer toe(s) (acquired), left foot: Secondary | ICD-10-CM

## 2023-12-12 DIAGNOSIS — M7741 Metatarsalgia, right foot: Secondary | ICD-10-CM

## 2023-12-12 NOTE — Progress Notes (Unsigned)
Subjective: Chief Complaint  Patient presents with   Foot Pain    RM#13 F/U on capsulitis, hammertoe, bunion patient states is doing better now.    78 year old female presents the office for above concerns.  States that she is doing much better now.  Her main concern is that her second toe is rubbing particularly when she wears dress shoes or other shoes outside of her sneakers.  No recent injury or changes otherwise.    Objective: AAO x3, NAD DP/PT pulses palpable bilaterally, CRT less than 3 seconds  Hammertoe contractures present and second digit and has mild erythema on the dorsal PIPJ from irritation but no skin breakdown or warmth.  Bunion is present.  No areas pinpoint tenderness.  No other areas of discomfort.  Unable to appreciate any other areas of discomfort at this time bilaterally. No pain with calf compression, swelling, warmth, erythema  Assessment: Capsulitis, hammertoe, bunion  Plan: -All treatment options discussed with the patient including all alternatives, risks, complications.  -She is happy the orthotics we will continue with these.  Pain is much improved since management. -In regards to the hammertoe we discussed the conservative as well as surgical options.  Dispensed.  Offloading devices to help help with toe down.  We discussed flexor tenotomy or PIPJ arthrodesis if needed.  She would likely want to proceed with flexor tenotomy.  She will see how the conservative treatments go and if needed we will likely proceed with tenotomy  Return if symptoms worsen or fail to improve.  Vivi Barrack DPM

## 2023-12-12 NOTE — Patient Instructions (Signed)
You can use UREA NAIL GEL on the thicker toenail

## 2023-12-15 ENCOUNTER — Ambulatory Visit
Admission: RE | Admit: 2023-12-15 | Discharge: 2023-12-15 | Disposition: A | Discharge status: Home / Self Care | Payer: Medicare Other | Source: Ambulatory Visit

## 2023-12-15 DIAGNOSIS — Z87891 Personal history of nicotine dependence: Secondary | ICD-10-CM

## 2023-12-15 DIAGNOSIS — Z122 Encounter for screening for malignant neoplasm of respiratory organs: Secondary | ICD-10-CM

## 2023-12-25 ENCOUNTER — Ambulatory Visit: Payer: Medicare Other | Attending: Cardiology

## 2023-12-25 DIAGNOSIS — I5022 Chronic systolic (congestive) heart failure: Secondary | ICD-10-CM

## 2023-12-25 DIAGNOSIS — Z95810 Presence of automatic (implantable) cardiac defibrillator: Secondary | ICD-10-CM | POA: Diagnosis not present

## 2023-12-26 ENCOUNTER — Telehealth: Payer: Self-pay | Admitting: *Deleted

## 2023-12-26 DIAGNOSIS — Z87891 Personal history of nicotine dependence: Secondary | ICD-10-CM

## 2023-12-26 DIAGNOSIS — R911 Solitary pulmonary nodule: Secondary | ICD-10-CM

## 2023-12-26 NOTE — Telephone Encounter (Signed)
Spoke with pt and reviewed lung screening CT results. One new small lung nodule seen . Will plan to repeat CT in 6 months. Pt verbalized understanding. Order placed for 6 mth nodule f/u CT.

## 2023-12-26 NOTE — Telephone Encounter (Signed)
Left VM for pt to call to discuss lung screening CT results.

## 2023-12-26 NOTE — Addendum Note (Signed)
Addended by: Abigail Miyamoto D on: 12/26/2023 11:54 AM   Modules accepted: Orders

## 2024-01-15 NOTE — Progress Notes (Signed)
 Remote ICD transmission.

## 2024-01-29 ENCOUNTER — Ambulatory Visit: Payer: Medicare Other | Attending: Cardiology

## 2024-01-29 DIAGNOSIS — Z95810 Presence of automatic (implantable) cardiac defibrillator: Secondary | ICD-10-CM | POA: Diagnosis not present

## 2024-01-29 DIAGNOSIS — I5022 Chronic systolic (congestive) heart failure: Secondary | ICD-10-CM | POA: Diagnosis not present

## 2024-01-30 NOTE — Progress Notes (Signed)
 EPIC Encounter for ICM Monitoring  Patient Name: Hailey Wright is a 78 y.o. female Date: 01/30/2024 Primary Care Physican: Garlan Fillers, MD Primary Cardiologist: Hochrein Electrophysiologist: Elberta Fortis 07/28/2022 Weight: 142 lbs 01/25/2023/2024 Weight: 140.4 lbs 04/05/2023 Weight: 140 lbs 05/09/2023 Weight: 142 lbs 11/24/2023 Weight:  148 lbs 12/28/2023 Weight: 145 lbs 01/30/2024 Weight: 146 lbs   Spoke with patient and heart failure questions reviewed.  Transmission results reviewed.  Pt did have some swelling in feet this past week.  She did take PRN lasix a few times last week.  She was on vacation this past week 3/5-3/9.  She is taking a cruise 4/5-4/22.     Corvue thoracic impedance suggesting possible fluid accumulation starting 3/5.   Prescribed:  Furosemide 40 mg 0.5 tablet (20 mg total) by mouth daily as needed.   Potassium 20 mEq take 1 tablet as needed when taking Lasix   Labs: 09/18/2023 Creatinine 0.86, BUN 18, Potassium 4.8, Sodium 144, GFR 70 05/04/2023 Creatinine 0.90, BUN 15, Potassium 3.7, Sodium 140, GFR >60 A complete set of results can be found in Results Review.   Recommendations:  Advised to take PRN Furosemide + potassium x 2 days.   Follow-up plan: ICM clinic phone appointment on 02/05/2024 to recheck fluid levels.   91 day device clinic remote transmission 03/04/2024.     EP/Cardiology Office Visit:   02/08/2024 with Dr. Antoine Poche.  Recall 09/12/2024 with Dr Elberta Fortis.   Copy of ICM check sent to Dr. Elberta Fortis.  3 month ICM trend: 01/29/2024.    12-14 Month ICM trend:     Karie Soda, RN 01/30/2024 9:07 AM

## 2024-02-05 ENCOUNTER — Ambulatory Visit: Attending: Cardiology

## 2024-02-05 DIAGNOSIS — Z95810 Presence of automatic (implantable) cardiac defibrillator: Secondary | ICD-10-CM

## 2024-02-05 DIAGNOSIS — I5022 Chronic systolic (congestive) heart failure: Secondary | ICD-10-CM

## 2024-02-06 NOTE — Progress Notes (Signed)
 EPIC Encounter for ICM Monitoring  Patient Name: Hailey Wright is a 78 y.o. female Date: 02/06/2024 Primary Care Physican: Garlan Fillers, MD Primary Cardiologist: Hochrein Electrophysiologist: Elberta Fortis 07/28/2022 Weight: 142 lbs 01/25/2023/2024 Weight: 140.4 lbs 04/05/2023 Weight: 140 lbs 05/09/2023 Weight: 142 lbs 11/24/2023 Weight:  148 lbs 12/28/2023 Weight: 145 lbs 01/30/2024 Weight: 146 lbs 02/06/2024 Weight: 143 lbs   Spoke with patient and heart failure questions reviewed.  Transmission results reviewed.  Pt reports weight dropped 3 pounds and ring on finger is no longer tight.   She is taking a cruise 4/5-4/22.   She plans on taking PRN Lasix a few times during her April vacation.   Corvue thoracic impedance suggesting fluid levels returned to normal after taking PRN Lasix.   Prescribed:  Furosemide 40 mg 0.5 tablet (20 mg total) by mouth daily as needed.   Potassium 20 mEq take 1 tablet as needed when taking Lasix   Labs: 09/18/2023 Creatinine 0.86, BUN 18, Potassium 4.8, Sodium 144, GFR 70 05/04/2023 Creatinine 0.90, BUN 15, Potassium 3.7, Sodium 140, GFR >60 A complete set of results can be found in Results Review.   Recommendations:  No changes and encouraged to call if experiencing any fluid symptoms.   Follow-up plan: ICM clinic phone appointment on 03/18/2024.   91 day device clinic remote transmission 03/14/2024.     EP/Cardiology Office Visit:   02/08/2024 with Dr. Antoine Poche.  Recall 09/12/2024 with Dr Elberta Fortis.   Copy of ICM check sent to Dr. Elberta Fortis.  3 month ICM trend: 02/05/2024.    12-14 Month ICM trend:     Karie Soda, RN 02/06/2024 2:51 PM

## 2024-02-07 NOTE — Progress Notes (Unsigned)
 Cardiology Office Note:   Date:  02/08/2024  ID:  Hailey Wright, DOB [birth year], MRN [id number] PCP: Garlan Fillers, MD  [facility] Providers Cardiologist:  Rollene Rotunda, MD Electrophysiologist:  Will Jorja Loa, MD {  History of Present Illness:   Hailey Wright is a 78 y.o. female who presents for follow of CAD and ischemic cardiomyopathy.  She has a history of known coronary disease as previously described. She had a negative stress perfusion study in the fall of 2016.  However, she came back with symptoms of acute onset chest pain. The plan was an elective cardiac catheterization. However, prior to that presented with an acute anterior myocardial infarction. She had occlusion of the LAD with acute thrombus in the circumflex. She did have chronic occlusion of her right coronary artery. She was placed on intra-aortic balloon pump and had emergent CABG with a LIMA to the LAD and SVG to OM1. She is left with an ejection fraction of approximately 30%. She had an ICD placed.   Her EF improved on echo in July 2018 to 35%. EF was 35 - 40% in July of last year.  I have been titrating meds.     She had chest pain and had LHC on 09/30/2021 showed severe native CAD with subtotal occlusion of the left main , total occlusion of the ostial LAD, and total occlusion of the LCX as well as CTO of the RCA with proximal occlusion and bridging collaterals. Prior grafts were widely patents. Therefore we continued medical management. She was continued on DAPT with Aspirin and Plavix and Imdur was increased.    Since I saw last saw her she has done well.  She did have some increased volume per her impedance on her device interrogation earlier this year.  She took a couple of days of Lasix and her impedance was rechecked just recently.  I reviewed all these records.  Her impedance was up to normal.  She has had no shortness of breath, PND or orthopnea.  She has had no palpitations, presyncope or syncope.  The  only way she knows that her swelling is happening is when her rings get tight.  She is about to go on a cruise through [place].  ROS: As stated in the HPI and negative for all other systems.  Studies Reviewed:    EKG:   NA  Physical Exam:   VS:  BP (!) 98/58 (BP Location: Left Arm, Patient Position: Sitting, Cuff Size: Normal)   Pulse 70   Ht 5\' 6"  (1.676 m)   Wt 146 lb (66.2 kg)   LMP  (LMP Unknown)   SpO2 98%   BMI 23.57 kg/m    Wt Readings from Last 3 Encounters:  02/08/24 146 lb (66.2 kg)  09/18/23 145 lb (65.8 kg)  05/04/23 142 lb (64.4 kg)     GEN: Well nourished, well developed in no acute distress NECK: No JVD; No carotid bruits CARDIAC: RRR, 3/6 apical systolic murmur, no diastolic murmurs, rubs, gallops RESPIRATORY:  Clear to auscultation without rales, wheezing or rhonchi  ABDOMEN: Soft, non-tender, non-distended EXTREMITIES:  No edema; No deformity   ASSESSMENT AND PLAN:   CAD : The patient has no new sypmtoms.  No further cardiovascular testing is indicated.  We will continue with aggressive risk reduction and meds as listed.   Ischemic CM: Her ejection fraction is 30 to 35%.  She has not tolerated med titration.  I was giving her metoprolol as needed and  she has not been taking this so we will remove it from her list.  No change in therapy.   HL: LDL was up to 105.  This is despite maximum dose Crestor and Zetia.  I am going to stop the Crestor and continue the Zetia.  I like to switch her to Repatha.  She can get a lipid profile in 3 months.   ICD:     She is up-to-date with follow-up as above.  No change in therapy.     Follow up with me in 1 year  Signed, Rollene Rotunda, MD

## 2024-02-08 ENCOUNTER — Encounter: Payer: Self-pay | Admitting: Cardiology

## 2024-02-08 ENCOUNTER — Ambulatory Visit: Payer: Medicare Other | Attending: Cardiology | Admitting: Cardiology

## 2024-02-08 VITALS — BP 98/58 | HR 70 | Ht 66.0 in | Wt 146.0 lb

## 2024-02-08 DIAGNOSIS — Z95810 Presence of automatic (implantable) cardiac defibrillator: Secondary | ICD-10-CM

## 2024-02-08 DIAGNOSIS — I2510 Atherosclerotic heart disease of native coronary artery without angina pectoris: Secondary | ICD-10-CM

## 2024-02-08 NOTE — Patient Instructions (Signed)

## 2024-02-09 ENCOUNTER — Telehealth: Payer: Self-pay | Admitting: Pharmacy Technician

## 2024-02-09 ENCOUNTER — Other Ambulatory Visit (HOSPITAL_COMMUNITY): Payer: Self-pay

## 2024-02-09 NOTE — Telephone Encounter (Signed)
 Pharmacy Patient Advocate Encounter   Received notification from  STAFF MESSAGES  that prior authorization for REPATHA is required/requested.   Insurance verification completed.   The patient is insured through [employer] .   Per test claim: PA required; PA submitted to above mentioned insurance via CoverMyMeds Key/confirmation #/EOC [id number] Status is pending

## 2024-02-09 NOTE — Telephone Encounter (Signed)
 Pharmacy Patient Advocate Encounter  Received notification from [employer] that Prior Authorization for repatha has been APPROVED from 01/10/24 to 11/20/2098. Ran test claim, Copay is $43.00. This test claim was processed through [facility]- copay amounts may vary at other pharmacies due to pharmacy/plan contracts, or as the patient moves through the different stages of their insurance plan.   PA #/Case ID/Reference #: [id number]

## 2024-02-12 ENCOUNTER — Other Ambulatory Visit: Payer: Self-pay

## 2024-02-12 MED ORDER — REPATHA SURECLICK 140 MG/ML ~~LOC~~ SOAJ: 140.0000 mg | SUBCUTANEOUS | 5 refills | Status: DC

## 2024-03-14 ENCOUNTER — Ambulatory Visit (INDEPENDENT_AMBULATORY_CARE_PROVIDER_SITE_OTHER)

## 2024-03-14 DIAGNOSIS — I255 Ischemic cardiomyopathy: Secondary | ICD-10-CM

## 2024-03-14 LAB — CUP PACEART REMOTE DEVICE CHECK
Battery Remaining Longevity: 23 mo
Battery Remaining Percentage: 24 %
Battery Voltage: 2.8 V
Brady Statistic AP VP Percent: 1 %
Brady Statistic AP VS Percent: 1 %
Brady Statistic AS VP Percent: 1 %
Brady Statistic AS VS Percent: 99 %
Brady Statistic RA Percent Paced: 1 %
Brady Statistic RV Percent Paced: 1 %
Date Time Interrogation Session: 20250424020018
HighPow Impedance: 79 Ohm
HighPow Impedance: 79 Ohm
Implantable Lead Connection Status: 753985
Implantable Lead Connection Status: 753985
Implantable Lead Implant Date: 20170727
Implantable Lead Implant Date: 20170727
Implantable Lead Location: 753859
Implantable Lead Location: 753860
Implantable Pulse Generator Implant Date: 20170727
Lead Channel Impedance Value: 390 Ohm
Lead Channel Impedance Value: 410 Ohm
Lead Channel Pacing Threshold Amplitude: 0.5 V
Lead Channel Pacing Threshold Amplitude: 1 V
Lead Channel Pacing Threshold Pulse Width: 0.5 ms
Lead Channel Pacing Threshold Pulse Width: 0.5 ms
Lead Channel Sensing Intrinsic Amplitude: 12 mV
Lead Channel Sensing Intrinsic Amplitude: 3.3 mV
Lead Channel Setting Pacing Amplitude: 2 V
Lead Channel Setting Pacing Amplitude: 2.5 V
Lead Channel Setting Pacing Pulse Width: 0.5 ms
Lead Channel Setting Sensing Sensitivity: 0.5 mV
Pulse Gen Serial Number: 7261095
Zone Setting Status: 755011

## 2024-03-18 ENCOUNTER — Other Ambulatory Visit: Payer: Self-pay | Admitting: Pharmacist Clinician (PhC)/ Clinical Pharmacy Specialist

## 2024-03-18 ENCOUNTER — Ambulatory Visit: Attending: Cardiology

## 2024-03-18 DIAGNOSIS — Z95810 Presence of automatic (implantable) cardiac defibrillator: Secondary | ICD-10-CM

## 2024-03-18 DIAGNOSIS — L821 Other seborrheic keratosis: Secondary | ICD-10-CM | POA: Diagnosis not present

## 2024-03-18 DIAGNOSIS — L550 Sunburn of first degree: Secondary | ICD-10-CM | POA: Diagnosis not present

## 2024-03-18 DIAGNOSIS — I5022 Chronic systolic (congestive) heart failure: Secondary | ICD-10-CM | POA: Diagnosis not present

## 2024-03-18 DIAGNOSIS — L570 Actinic keratosis: Secondary | ICD-10-CM | POA: Diagnosis not present

## 2024-03-18 DIAGNOSIS — L814 Other melanin hyperpigmentation: Secondary | ICD-10-CM | POA: Diagnosis not present

## 2024-03-18 MED ORDER — REPATHA SURECLICK 140 MG/ML ~~LOC~~ SOAJ: 140.0000 mg | SUBCUTANEOUS | 3 refills | Status: DC

## 2024-03-19 NOTE — Progress Notes (Signed)
 EPIC Encounter for ICM Monitoring  Patient Name: Hailey Wright is a 78 y.o. female Date: 03/19/2024 Primary Care Physican: Bertha Broad, MD Primary Cardiologist: Hochrein Electrophysiologist: Lawana Pray 07/28/2022 Weight: 142 lbs 01/25/2023 Weight: 140.4 lbs 04/05/2023 Weight: 140 lbs 05/09/2023 Weight: 142 lbs 11/24/2023 Weight:  148 lbs 12/28/2023 Weight: 145 lbs 01/30/2024 Weight: 146 lbs 02/06/2024 Weight: 143 lbs 03/19/2024 Weight: 147 lbs   Spoke with patient and heart failure questions reviewed.  Transmission results reviewed.  Pt asymptomatic for fluid accumulation.  Feet/ankles were swollen during the cruise 4/5-4/22 but resolved after taking PRN lasix .  Pt has been started on Repatha  at last visit with Dr Lavonne Prairie.     Corvue thoracic impedance suggesting suggesting possible dryness starting 4/23 (returned from cruise 4/22) in response to taking PRN Lasix .  Trending back toward baseline 4/28.   Prescribed:  Furosemide  40 mg 0.5 tablet (20 mg total) by mouth daily as needed.   Potassium 20 mEq take 1 tablet as needed when taking Lasix    Labs: 09/18/2023 Creatinine 0.86, BUN 18, Potassium 4.8, Sodium 144, GFR 70 05/04/2023 Creatinine 0.90, BUN 15, Potassium 3.7, Sodium 140, GFR >60 A complete set of results can be found in Results Review.   Recommendations:  No changes and encouraged to call if experiencing any fluid symptoms.   Follow-up plan: ICM clinic phone appointment on 04/22/2024.   91 day device clinic remote transmission 06/13/2024.     EP/Cardiology Office Visit:   Recall 02/02/2025 with Dr. Lavonne Prairie.  Recall 09/12/2024 with Dr Lawana Pray.   Copy of ICM check sent to Dr. Lawana Pray.  3 month ICM trend: 03/18/2024.    12-14 Month ICM trend:     Almyra Jain, RN 03/19/2024 7:33 AM

## 2024-03-21 ENCOUNTER — Encounter: Payer: Self-pay | Admitting: Cardiology

## 2024-03-21 DIAGNOSIS — E785 Hyperlipidemia, unspecified: Secondary | ICD-10-CM

## 2024-03-21 DIAGNOSIS — Z79899 Other long term (current) drug therapy: Secondary | ICD-10-CM

## 2024-03-21 NOTE — Telephone Encounter (Signed)
 Please review and advise.

## 2024-03-22 NOTE — Telephone Encounter (Signed)
 Hailey Grates, MD     03/22/24  8:40 AM She is going to continue on the Repatha  only.  She will come off the Zetia.  Please change her med list and if he can put some documentation I had a conversation with her yesterday.  Thank you for your help.   Per Dr. Lavonne Prairie:   -Patient contacted via telphone 03/21/24 by Dr. Lavonne Prairie   -Per conversation patient agrees to continue with Repatha  only and d/c Zetia   Zetia discontinued from Medication list  Repeat labs active Mychart message sent to patient

## 2024-03-26 DIAGNOSIS — Z1212 Encounter for screening for malignant neoplasm of rectum: Secondary | ICD-10-CM | POA: Diagnosis not present

## 2024-03-26 DIAGNOSIS — E785 Hyperlipidemia, unspecified: Secondary | ICD-10-CM | POA: Diagnosis not present

## 2024-03-26 DIAGNOSIS — I10 Essential (primary) hypertension: Secondary | ICD-10-CM | POA: Diagnosis not present

## 2024-03-26 DIAGNOSIS — K219 Gastro-esophageal reflux disease without esophagitis: Secondary | ICD-10-CM | POA: Diagnosis not present

## 2024-03-26 DIAGNOSIS — I2510 Atherosclerotic heart disease of native coronary artery without angina pectoris: Secondary | ICD-10-CM | POA: Diagnosis not present

## 2024-04-02 DIAGNOSIS — Z0000 Encounter for general adult medical examination without abnormal findings: Secondary | ICD-10-CM | POA: Diagnosis not present

## 2024-04-02 DIAGNOSIS — I130 Hypertensive heart and chronic kidney disease with heart failure and stage 1 through stage 4 chronic kidney disease, or unspecified chronic kidney disease: Secondary | ICD-10-CM | POA: Diagnosis not present

## 2024-04-02 DIAGNOSIS — R82998 Other abnormal findings in urine: Secondary | ICD-10-CM | POA: Diagnosis not present

## 2024-04-02 DIAGNOSIS — N1831 Chronic kidney disease, stage 3a: Secondary | ICD-10-CM | POA: Diagnosis not present

## 2024-04-02 DIAGNOSIS — Z87891 Personal history of nicotine dependence: Secondary | ICD-10-CM | POA: Diagnosis not present

## 2024-04-02 DIAGNOSIS — I2510 Atherosclerotic heart disease of native coronary artery without angina pectoris: Secondary | ICD-10-CM | POA: Diagnosis not present

## 2024-04-02 DIAGNOSIS — I7389 Other specified peripheral vascular diseases: Secondary | ICD-10-CM | POA: Diagnosis not present

## 2024-04-02 DIAGNOSIS — I5022 Chronic systolic (congestive) heart failure: Secondary | ICD-10-CM | POA: Diagnosis not present

## 2024-04-02 DIAGNOSIS — I255 Ischemic cardiomyopathy: Secondary | ICD-10-CM | POA: Diagnosis not present

## 2024-04-02 DIAGNOSIS — G720 Drug-induced myopathy: Secondary | ICD-10-CM | POA: Diagnosis not present

## 2024-04-02 DIAGNOSIS — J439 Emphysema, unspecified: Secondary | ICD-10-CM | POA: Diagnosis not present

## 2024-04-02 DIAGNOSIS — M2041 Other hammer toe(s) (acquired), right foot: Secondary | ICD-10-CM | POA: Diagnosis not present

## 2024-04-02 DIAGNOSIS — Z95810 Presence of automatic (implantable) cardiac defibrillator: Secondary | ICD-10-CM | POA: Diagnosis not present

## 2024-04-22 ENCOUNTER — Ambulatory Visit: Attending: Cardiology

## 2024-04-22 DIAGNOSIS — Z95810 Presence of automatic (implantable) cardiac defibrillator: Secondary | ICD-10-CM | POA: Diagnosis not present

## 2024-04-22 DIAGNOSIS — I5022 Chronic systolic (congestive) heart failure: Secondary | ICD-10-CM

## 2024-04-23 NOTE — Progress Notes (Signed)
 Remote ICD transmission.

## 2024-04-23 NOTE — Progress Notes (Signed)
 EPIC Encounter for ICM Monitoring  Patient Name: Hailey Wright is a 78 y.o. female Date: 04/23/2024 Primary Care Physican: Bertha Broad, MD Primary Cardiologist: Hochrein Electrophysiologist: Lawana Pray 11/24/2023 Weight:  148 lbs 12/28/2023 Weight: 145 lbs 01/30/2024 Weight: 146 lbs 02/06/2024 Weight: 143 lbs 03/19/2024 Weight: 147 lbs   Spoke with patient and heart failure questions reviewed.  Transmission results reviewed.  Pt asymptomatic for fluid accumulation.   She is feeling well and is without comlaints.   Corvue thoracic impedance suggesting possible dryness 5/10-5/15 and 5/26-5/27 in response to taking PRN Lasix  20 mg.     Prescribed:  Furosemide  40 mg 0.5 tablet (20 mg total) by mouth daily as needed.   Potassium 20 mEq take 1 tablet as needed when taking Lasix    Labs: 09/18/2023 Creatinine 0.86, BUN 18, Potassium 4.8, Sodium 144, GFR 70 05/04/2023 Creatinine 0.90, BUN 15, Potassium 3.7, Sodium 140, GFR >60 A complete set of results can be found in Results Review.   Recommendations:  No changes and encouraged to call if experiencing any fluid symptoms.   Follow-up plan: ICM clinic phone appointment on 06/03/2024.   91 day device clinic remote transmission 06/13/2024.     EP/Cardiology Office Visit:   Recall 02/02/2025 with Dr. Lavonne Prairie.  Recall 09/12/2024 with Dr Lawana Pray.   Copy of ICM check sent to Dr. Lawana Pray.  3 month ICM trend: 04/22/2024.    12-14 Month ICM trend:     Almyra Jain, RN 04/23/2024 4:15 PM

## 2024-06-03 ENCOUNTER — Ambulatory Visit
Admission: RE | Admit: 2024-06-03 | Discharge: 2024-06-03 | Disposition: A | Discharge status: Home / Self Care | Source: Ambulatory Visit | Attending: Acute Care | Admitting: Acute Care

## 2024-06-03 ENCOUNTER — Ambulatory Visit: Attending: Cardiology

## 2024-06-03 DIAGNOSIS — R918 Other nonspecific abnormal finding of lung field: Secondary | ICD-10-CM | POA: Diagnosis not present

## 2024-06-03 DIAGNOSIS — I5022 Chronic systolic (congestive) heart failure: Secondary | ICD-10-CM

## 2024-06-03 DIAGNOSIS — Z87891 Personal history of nicotine dependence: Secondary | ICD-10-CM

## 2024-06-03 DIAGNOSIS — R911 Solitary pulmonary nodule: Secondary | ICD-10-CM

## 2024-06-03 DIAGNOSIS — Z95810 Presence of automatic (implantable) cardiac defibrillator: Secondary | ICD-10-CM | POA: Diagnosis not present

## 2024-06-04 NOTE — Progress Notes (Unsigned)
 EPIC Encounter for ICM Monitoring  Patient Name: Hailey Wright is a 78 y.o. female Date: 06/04/2024 Primary Care Physican: Yolande Toribio MATSU, MD Primary Cardiologist: Hochrein Electrophysiologist: Inocencio 11/24/2023 Weight:  148 lbs 12/28/2023 Weight: 145 lbs 01/30/2024 Weight: 146 lbs 02/06/2024 Weight: 143 lbs 03/19/2024 Weight: 147 lbs 06/05/2024 Weight: 159 lbs   Spoke with patient and heart failure questions reviewed.  Transmission results reviewed.  Pt asymptomatic for fluid accumulation.  Reports feeling well at this time and voices no complaints.  She was on vacation during decreased impedance.   Corvue thoracic impedance suggesting possible fluid accumulation starting 7/11 and returned close to baseline 7/14.     Prescribed:  Furosemide  40 mg 0.5 tablet (20 mg total) by mouth daily as needed.   Potassium 20 mEq take 1 tablet as needed when taking Lasix    Labs: 09/18/2023 Creatinine 0.86, BUN 18, Potassium 4.8, Sodium 144, GFR 70 05/04/2023 Creatinine 0.90, BUN 15, Potassium 3.7, Sodium 140, GFR >60 A complete set of results can be found in Results Review.   Recommendations:  No changes and encouraged to call if experiencing any fluid symptoms.   Follow-up plan: ICM clinic phone appointment on 07/08/2024.   91 day device clinic remote transmission 06/13/2024.     EP/Cardiology Office Visit:   Recall 02/02/2025 with Dr. Lavona.  Recall 09/12/2024 with Dr Inocencio.   Copy of ICM check sent to Dr. Inocencio.  3 month ICM trend: 06/03/2024.    12-14 Month ICM trend:     [name], RN 06/04/2024 1:29 PM

## 2024-06-07 ENCOUNTER — Encounter: Payer: Self-pay | Admitting: Cardiology

## 2024-06-10 DIAGNOSIS — E785 Hyperlipidemia, unspecified: Secondary | ICD-10-CM | POA: Diagnosis not present

## 2024-06-10 DIAGNOSIS — Z79899 Other long term (current) drug therapy: Secondary | ICD-10-CM | POA: Diagnosis not present

## 2024-06-11 ENCOUNTER — Ambulatory Visit: Payer: Self-pay | Admitting: Cardiology

## 2024-06-11 LAB — LIPID PANEL
Chol/HDL Ratio: 1.9 ratio (ref 0.0–4.4)
Cholesterol, Total: 203 mg/dL — ABNORMAL HIGH (ref 100–199)
HDL: 105 mg/dL (ref >39)
LDL Chol Calc (NIH): 85 mg/dL (ref 0–99)
Triglycerides: 72 mg/dL (ref 0–149)
VLDL Cholesterol Cal: 13 mg/dL (ref 5–40)

## 2024-06-11 NOTE — Telephone Encounter (Signed)
-----   Message from Lynwood Schilling sent at 06/11/2024  8:26 AM EDT ----- LDL was 49.  Now LDL is up slightly.    I was under the impression that the last level before we switched from Crestor  and Zetia was 105.  Please clarify.  Was the LDL 49 when she was on Repatha  or  Crestor [place].  We might need to switch back.   ----- Message ----- From: Rebecka Memos Lab Results In Sent: 06/11/2024   5:36 AM EDT To: Lynwood Schilling, MD

## 2024-06-11 NOTE — Telephone Encounter (Signed)
 Spoke with pt regarding her lab results. Pt stated her LDL was 141 in March of 2024 when she was taking both Zetia and Crestor . Pt stated her cholesterol was 144 in May of 2025 when she had just started Repatha  on April 28th. Pt is unsure of why there was a jump in her numbers. Pt was told this information would be forwarded to Dr. Lavona for his suggestions. Pt verbalized understanding.

## 2024-06-12 ENCOUNTER — Other Ambulatory Visit: Payer: Self-pay

## 2024-06-12 DIAGNOSIS — Z122 Encounter for screening for malignant neoplasm of respiratory organs: Secondary | ICD-10-CM

## 2024-06-12 DIAGNOSIS — Z87891 Personal history of nicotine dependence: Secondary | ICD-10-CM

## 2024-06-13 ENCOUNTER — Ambulatory Visit

## 2024-06-13 DIAGNOSIS — I255 Ischemic cardiomyopathy: Secondary | ICD-10-CM | POA: Diagnosis not present

## 2024-06-14 LAB — CUP PACEART REMOTE DEVICE CHECK
Battery Remaining Longevity: 19 mo
Battery Remaining Percentage: 19 %
Battery Voltage: 2.77 V
Brady Statistic AP VP Percent: 1 %
Brady Statistic AP VS Percent: 1 %
Brady Statistic AS VP Percent: 1 %
Brady Statistic AS VS Percent: 98 %
Brady Statistic RA Percent Paced: 1 %
Brady Statistic RV Percent Paced: 1 %
Date Time Interrogation Session: 20250724020028
HighPow Impedance: 83 Ohm
HighPow Impedance: 83 Ohm
Implantable Lead Connection Status: 753985
Implantable Lead Connection Status: 753985
Implantable Lead Implant Date: 20170727
Implantable Lead Implant Date: 20170727
Implantable Lead Location: 753859
Implantable Lead Location: 753860
Implantable Pulse Generator Implant Date: 20170727
Lead Channel Impedance Value: 410 Ohm
Lead Channel Impedance Value: 410 Ohm
Lead Channel Pacing Threshold Amplitude: 0.5 V
Lead Channel Pacing Threshold Amplitude: 1 V
Lead Channel Pacing Threshold Pulse Width: 0.5 ms
Lead Channel Pacing Threshold Pulse Width: 0.5 ms
Lead Channel Sensing Intrinsic Amplitude: 12 mV
Lead Channel Sensing Intrinsic Amplitude: 3.7 mV
Lead Channel Setting Pacing Amplitude: 2 V
Lead Channel Setting Pacing Amplitude: 2.5 V
Lead Channel Setting Pacing Pulse Width: 0.5 ms
Lead Channel Setting Sensing Sensitivity: 0.5 mV
Pulse Gen Serial Number: 7261095
Zone Setting Status: 755011

## 2024-06-14 NOTE — Telephone Encounter (Signed)
-----   Message from Lynwood Schilling sent at 06/11/2024  8:26 AM EDT ----- LDL was 49.  Now LDL is up slightly.    I was under the impression that the last level before we switched from Crestor  and Zetia was 105.  Please clarify.  Was the LDL 49 when she was on Repatha  or  Crestor [place].  We might need to switch back.   ----- Message ----- From: Rebecka Memos Lab Results In Sent: 06/11/2024   5:36 AM EDT To: Lynwood Schilling, MD

## 2024-06-14 NOTE — Telephone Encounter (Signed)
 See result note from 7/22

## 2024-06-14 NOTE — Telephone Encounter (Signed)
 Spoke with pt regarding her results and Dr. Denver suggestions. Pt aware that he will give her a call when he is back from vacation. Pt aware to send us  LDL information on MyChart. Pt verbalized understanding. All questions if any were answered.

## 2024-06-16 ENCOUNTER — Encounter: Payer: Self-pay | Admitting: Cardiology

## 2024-06-16 DIAGNOSIS — E785 Hyperlipidemia, unspecified: Secondary | ICD-10-CM

## 2024-06-17 ENCOUNTER — Ambulatory Visit: Payer: Self-pay | Admitting: Cardiology

## 2024-06-27 ENCOUNTER — Encounter: Payer: Self-pay | Admitting: Podiatry

## 2024-06-27 ENCOUNTER — Ambulatory Visit (INDEPENDENT_AMBULATORY_CARE_PROVIDER_SITE_OTHER): Admitting: Podiatry

## 2024-06-27 VITALS — Ht 66.0 in | Wt 146.0 lb

## 2024-06-27 DIAGNOSIS — M2042 Other hammer toe(s) (acquired), left foot: Secondary | ICD-10-CM

## 2024-06-27 NOTE — Progress Notes (Signed)
  Subjective: Chief Complaint  Patient presents with   Hammer Toe    Pt is here to discuss left second toe, states a discussion was had about cutting tendon on the toe to help straighten the toe, she states she is ready.    78 year old female presents the office for above concerns.  She states that this time she wants to proceed with surgery given ongoing discomfort of the right second toe.  She tried shoe modifications, offloading without any improvement.     Objective: AAO x3, NAD DP/PT pulses palpable bilaterally, CRT less than 3 seconds Hammertoe contractures present and second digit and has mild erythema on the dorsal PIPJ from irritation but no skin breakdown or warmth.  Bunion is present.  No areas pinpoint tenderness.  No other areas of discomfort.  Unable to appreciate any other areas of discomfort at this time bilaterally. No pain with calf compression, swelling, warmth, erythema  Assessment: Capsulitis, hammertoe  Plan: -All treatment options discussed with the patient including all alternatives, risks, complications.  -Most of her symptoms today are on the second toe.  We can repeat the x-ray with the patient.  Discussed with conservative as well as surgical treatment options.  Discussed with her second digit PIPJ arthrodesis with screw or wire fixation given the more rigid nature of the toe today.  After discussion she wishes to proceed. -The incision placement as well as the postoperative course was discussed with the patient. I discussed risks of the surgery which include, but not limited to, infection, bleeding, pain, swelling, need for further surgery, delayed or nonhealing, painful or ugly scar, numbness or sensation changes, over/under correction, recurrence, transfer lesions, further deformity, hardware failure, DVT/PE, loss of toe/foot. Patient understands these risks and wishes to proceed with surgery. The surgical consent was reviewed with the patient all 3 pages were  signed. No promises or guarantees were given to the outcome of the procedure. All questions were answered to the best of my ability. Before the surgery the patient was encouraged to call the office if there is any further questions. The surgery will be performed at the [facility] on an outpatient basis.  No follow-ups on file.  Donnice JONELLE Fees DPM

## 2024-06-27 NOTE — Patient Instructions (Signed)

## 2024-06-27 NOTE — Telephone Encounter (Signed)
 Left voice message with Dr. Denver suggestions for pt per DPR. Pt told to call should she have any questions.

## 2024-07-02 ENCOUNTER — Telehealth: Payer: Self-pay | Admitting: Podiatry

## 2024-07-02 NOTE — Telephone Encounter (Addendum)
 Received surgical consent form.  Left message for pt to call to schedule the surgery.  Incoming call and pt is scheduled for 10/8 per her request. She is on aspirin  and is aware to stop 1 wk before surgery. She is not on any GLP1 medications. Pharmacy is correct in chart.

## 2024-07-04 ENCOUNTER — Encounter: Payer: Self-pay | Admitting: Cardiology

## 2024-07-08 ENCOUNTER — Ambulatory Visit: Attending: Cardiology

## 2024-07-08 DIAGNOSIS — Z95810 Presence of automatic (implantable) cardiac defibrillator: Secondary | ICD-10-CM | POA: Diagnosis not present

## 2024-07-08 DIAGNOSIS — I5022 Chronic systolic (congestive) heart failure: Secondary | ICD-10-CM | POA: Diagnosis not present

## 2024-07-10 NOTE — Progress Notes (Signed)
 EPIC Encounter for ICM Monitoring  Patient Name: Hailey Wright is a 78 y.o. female Date: 07/10/2024 Primary Care Physican: Yolande Toribio MATSU, MD Primary Cardiologist: Hochrein Electrophysiologist: Inocencio 11/24/2023 Weight:  148 lbs 12/28/2023 Weight: 145 lbs 01/30/2024 Weight: 146 lbs 02/06/2024 Weight: 143 lbs 03/19/2024 Weight: 147 lbs 06/05/2024 Weight: 159 lbs   Spoke with patient and heart failure questions reviewed.  Transmission results reviewed.  Pt asymptomatic for fluid accumulation.  Reports feeling well at this time and voices no complaints.   She took Lasix  twice last week after eating [place] food.     Corvue thoracic impedance suggesting normal fluid levels within the last month.     Prescribed:  Furosemide  40 mg 0.5 tablet (20 mg total) by mouth daily as needed.   Potassium 20 mEq take 1 tablet as needed when taking Lasix    Labs: 09/18/2023 Creatinine 0.86, BUN 18, Potassium 4.8, Sodium 144, GFR 70 05/04/2023 Creatinine 0.90, BUN 15, Potassium 3.7, Sodium 140, GFR >60 A complete set of results can be found in Results Review.   Recommendations:  No changes and encouraged to call if experiencing any fluid symptoms.   Follow-up plan: ICM clinic phone appointment on 08/12/2024.   91 day device clinic remote transmission 09/12/2024.     EP/Cardiology Office Visit:   Recall 02/02/2025 with Dr. Lavona.  Recall 09/12/2024 with Dr Inocencio.   Copy of ICM check sent to Dr. Inocencio.  3 month ICM trend: 07/08/2024.    12-14 Month ICM trend:     Hailey GORMAN Garner, RN 07/10/2024 7:56 AM

## 2024-08-12 ENCOUNTER — Ambulatory Visit: Attending: Cardiology

## 2024-08-12 DIAGNOSIS — Z95810 Presence of automatic (implantable) cardiac defibrillator: Secondary | ICD-10-CM | POA: Diagnosis not present

## 2024-08-12 DIAGNOSIS — I5022 Chronic systolic (congestive) heart failure: Secondary | ICD-10-CM | POA: Diagnosis not present

## 2024-08-13 ENCOUNTER — Telehealth: Payer: Self-pay

## 2024-08-13 NOTE — Progress Notes (Signed)
 EPIC Encounter for ICM Monitoring  Patient Name: Hailey Wright is a 78 y.o. female Date: 08/13/2024 Primary Care Physican: Yolande Toribio MATSU, MD Primary Cardiologist: Hochrein Electrophysiologist: Inocencio 11/24/2023 Weight:  148 lbs 12/28/2023 Weight: 145 lbs 01/30/2024 Weight: 146 lbs 02/06/2024 Weight: 143 lbs 03/19/2024 Weight: 147 lbs 06/05/2024 Weight: 159 lbs   Attempted call to patient and unable to reach.  Left detailed message per DPR regarding transmission.  Transmission results reviewed.    Corvue thoracic impedance suggesting normal fluid levels within the last month with the exception of possible fluid accumulation from 9/8-9/15 followed by possible dryness starting 9/16.  Returned close to baseline 9/22.   Prescribed:  Furosemide  40 mg 0.5 tablet (20 mg total) by mouth daily as needed.   Potassium 20 mEq take 1 tablet as needed when taking Lasix    Labs: 09/18/2023 Creatinine 0.86, BUN 18, Potassium 4.8, Sodium 144, GFR 70 05/04/2023 Creatinine 0.90, BUN 15, Potassium 3.7, Sodium 140, GFR >60 A complete set of results can be found in Results Review.   Recommendations:  No changes and encouraged to call if experiencing any fluid symptoms.   Follow-up plan: ICM clinic phone appointment on 09/16/2024.   91 day device clinic remote transmission 09/12/2024.     EP/Cardiology Office Visit:   Recall 02/02/2025 with Dr. Lavona.   Recall 09/12/2024 with Dr Inocencio.   Copy of ICM check sent to Dr. Inocencio.  3 month ICM trend: 08/12/2024.    12-14 Month ICM trend:     [name], RN 08/13/2024 8:05 AM

## 2024-08-13 NOTE — Telephone Encounter (Signed)
 Remote ICM transmission received.  Attempted call to patient regarding ICM remote transmission.  Left detailed message per DPR with ICM phone number to return call for any questions, concerns or fluid symptoms.

## 2024-08-22 NOTE — Progress Notes (Signed)
 Remote ICD Transmission

## 2024-08-23 ENCOUNTER — Telehealth: Payer: Self-pay | Admitting: Podiatry

## 2024-08-23 NOTE — Telephone Encounter (Signed)
 Received message from cynthia at the surgery center stating pt wanted to cancel surgery  Upon checking the surgery center is recommending the surgery to be done at the hospital due to medical history.I did leave a 2nd message stating the above information and I would call once I hear back from Dr Gershon.

## 2024-08-26 ENCOUNTER — Telehealth: Payer: Self-pay

## 2024-08-26 ENCOUNTER — Encounter: Payer: Self-pay | Admitting: Podiatry

## 2024-08-26 NOTE — Telephone Encounter (Signed)
   Pre-operative Risk Assessment    Patient Name: Hailey Wright  DOB: Oct 29, 1946 MRN: [id number]   Date of last office visit: 02/08/24 Date of next office visit: NA   Request for Surgical Clearance    Procedure:  Hammer Toe, Left Foot  Date of Surgery:  Clearance 09/18/24                               Surgeon:  Donnice Fees  Surgeon's Group or Practice Name:  [facility] Phone number:  [phone]  Fax number:  [phone]   Type of Clearance Requested:   - Medical  - Pharmacy:  Hold Aspirin      Type of Anesthesia:  Choice   Additional requests/questions:    [name]   08/26/2024, 2:03 PM

## 2024-08-26 NOTE — Telephone Encounter (Signed)
   Name: Hailey Wright  DOB: [birth year]  MRN: [id number]  Primary Cardiologist: Lynwood Schilling, MD   Preoperative team, please contact this patient and set up a phone call appointment for further preoperative risk assessment. Please obtain consent and complete medication review. Thank you for your help.  Patient has a device.   I confirm that guidance regarding antiplatelet and oral anticoagulation therapy has been completed and, if necessary, noted below.  Ideally aspirin  should be continued without interruption, however if the bleeding risk is too great, aspirin  may be held for 5-7 days prior to surgery. Please resume aspirin  post operatively when it is felt to be safe from a bleeding standpoint.    I also confirmed the patient resides in the state of [place] . As per [facility] Medical Board telemedicine laws, the patient must reside in the state in which the provider is licensed.    Barnie Hila, NP 08/26/2024, 4:47 PM [facility]

## 2024-08-26 NOTE — Telephone Encounter (Signed)
 Called and spoke with patient about getting her surgery rescheduled to 09/18/24 at [facility] . She is wanting to know if she will need cardiac clearance as well? Thanks

## 2024-08-26 NOTE — Telephone Encounter (Signed)
 Left message to call back to schedule tele pre op appt.

## 2024-08-27 ENCOUNTER — Telehealth: Payer: Self-pay

## 2024-08-27 NOTE — Telephone Encounter (Signed)
  Patient Consent for Virtual Visit      Hailey Wright has provided verbal consent on 08/27/2024 for a virtual visit (video or telephone). Appointment scheduled for 09/03/2024 @ 2:40pm. Med req and consent are complete. Call patient at [phone].    CONSENT FOR VIRTUAL VISIT FOR:  Hailey Wright  By participating in this virtual visit I agree to the following:  I hereby voluntarily request, consent and authorize [facility] and its employed or contracted physicians, physician assistants, nurse practitioners or other licensed health care professionals (the Practitioner), to provide me with telemedicine health care services (the "Services) as deemed necessary by the treating Practitioner. I acknowledge and consent to receive the Services by the Practitioner via telemedicine. I understand that the telemedicine visit will involve communicating with the Practitioner through live audiovisual communication technology and the disclosure of certain medical information by electronic transmission. I acknowledge that I have been given the opportunity to request an in-person assessment or other available alternative prior to the telemedicine visit and am voluntarily participating in the telemedicine visit.  I understand that I have the right to withhold or withdraw my consent to the use of telemedicine in the course of my care at any time, without affecting my right to future care or treatment, and that the Practitioner or I may terminate the telemedicine visit at any time. I understand that I have the right to inspect all information obtained and/or recorded in the course of the telemedicine visit and may receive copies of available information for a reasonable fee.  I understand that some of the potential risks of receiving the Services via telemedicine include:  Delay or interruption in medical evaluation due to technological equipment failure or disruption; Information transmitted may not be  sufficient (e.g. poor resolution of images) to allow for appropriate medical decision making by the Practitioner; and/or  In rare instances, security protocols could fail, causing a breach of personal health information.  Furthermore, I acknowledge that it is my responsibility to provide information about my medical history, conditions and care that is complete and accurate to the best of my ability. I acknowledge that Practitioner's advice, recommendations, and/or decision may be based on factors not within their control, such as incomplete or inaccurate data provided by me or distortions of diagnostic images or specimens that may result from electronic transmissions. I understand that the practice of medicine is not an exact science and that Practitioner makes no warranties or guarantees regarding treatment outcomes. I acknowledge that a copy of this consent can be made available to me via my patient portal [facility] MyChart), or I can request a printed copy by calling the office of [facility].    I understand that my insurance will be billed for this visit.   I have read or had this consent read to me. I understand the contents of this consent, which adequately explains the benefits and risks of the Services being provided via telemedicine.  I have been provided ample opportunity to ask questions regarding this consent and the Services and have had my questions answered to my satisfaction. I give my informed consent for the services to be provided through the use of telemedicine in my medical care

## 2024-08-27 NOTE — Telephone Encounter (Signed)
 Appointment scheduled for 09/03/2024 @ 2:40pm. Med req and consent are complete. Call patient at [phone].

## 2024-09-02 ENCOUNTER — Encounter

## 2024-09-02 ENCOUNTER — Encounter: Payer: Self-pay | Admitting: Cardiology

## 2024-09-03 ENCOUNTER — Ambulatory Visit: Attending: Cardiology | Admitting: Physician Assistant

## 2024-09-03 DIAGNOSIS — Z01810 Encounter for preprocedural cardiovascular examination: Secondary | ICD-10-CM | POA: Diagnosis not present

## 2024-09-03 NOTE — Progress Notes (Signed)
 Virtual Visit via Telephone Note   Because of Hailey Wright co-morbid illnesses, she is at least at moderate risk for complications without adequate follow up.  This format is felt to be most appropriate for this patient at this time.  Due to technical limitations with video connection (technology), today's appointment will be conducted as an audio only telehealth visit, and Hailey Wright verbally agreed to proceed in this manner.   All issues noted in this document were discussed and addressed.  No physical exam could be performed with this format.  Evaluation Performed:  Preoperative cardiovascular risk assessment _____________   Date:  09/03/2024   Patient ID:  Hailey Wright, DOB Feb 20, 1946, MRN [id number] Patient Location:  Home Provider location:   Office  Primary Care Provider:  Yolande Toribio MATSU, MD Primary Cardiologist:  Hailey Schilling, MD  Chief Complaint / Patient Profile   78 y.o. y/o female with a h/o ICD, HL, Ischemic CM, acute ST elevation MI involving the LAD March 2017,who is pending right foot hammer toe and presents today for telephonic preoperative cardiovascular risk assessment.  History of Present Illness    Hailey Wright is a 78 y.o. femalefemale who presents via audio/video conferencing for a telehealth visit today.  Pt was last seen in cardiology clinic on 02/08/24 by Dr. Schilling.  At that time Hailey Wright was doing well.  The patient is now pending procedure as outlined above. Since her last visit, she tells me that she has not had any chest pain or shortness of breath.  No swelling in her feet or ankles.  Occasionally she does need Lasix  but it is just as needed.  She goes to [employer] 3 days a week and does the treadmill, resistance machines, and weights for about 15 minutes.  She does meet minimum METS for clearance.   Ideally aspirin  should be continued without interruption, however if the bleeding risk is too great, aspirin  may be held for 5-7 days prior to  surgery. Please resume aspirin  post operatively when it is felt to be safe from a bleeding standpoint.   Past Medical History    Past Medical History:  Diagnosis Date   Acute ST elevation myocardial infarction (STEMI) involving left anterior descending coronary artery (HCC) 02/18/2016   AICD (automatic cardioverter/defibrillator) present    CAD (coronary artery disease) 06/04/2013   CHF (congestive heart failure) (HCC)    Clotting disorder    COPD (chronic obstructive pulmonary disease) (HCC)    Coronary artery thrombosis (HCC) 02/18/2016   DVT (deep venous thrombosis) (HCC) 1980s   RLE   Hepatitis A infection 1987   Hyperlipidemia    Hypertension    Ischemic cardiomyopathy    a. Echo 5/17: EF 25-30%, anteroseptal, anterior, anteroseptal and apical akinesis, possible inferior hypokinesis, grade 1 diastolic dysfunction, no evidence of thrombus, trivial AI, mild MR, mild LAE   MI (myocardial infarction) (HCC) 1991   Inferior treated with TPA 1991, POBA RCA 1996   Pneumonia 1980s X 1   RAS (renal artery stenosis)    Right renal stent 2005   S/P Emergency CABG x 2 02/18/2016   LIMA to LAD, SVG to OM1, EVH via right thigh   Type II diabetes mellitus (HCC)    pt denies   Past Surgical History:  Procedure Laterality Date   ADJACENT TISSUE TRANSFER/TISSUE REARRANGEMENT Right 05/04/2023   Procedure: ADJACENT TISSUE TRANSFER/TISSUE REARRANGEMENT;  Surgeon: Rodgers Faden, MD;  Location: [facility] OR;  Service: Ophthalmology;  Laterality: Right;   CANTHOPLASTY Right  05/04/2023   Procedure: MEDIAL CANTHOPLASTY;  Surgeon: Rodgers Faden, MD;  Location: [facility] OR;  Service: Ophthalmology;  Laterality: Right;   CARDIAC CATHETERIZATION N/A 02/18/2016   Procedure: Left Heart Cath and Coronary Angiography;  Surgeon: Alm LELON Clay, MD;  Location: [facility] INVASIVE CV LAB;  Service: Cardiovascular;  Laterality: N/A;   COLONOSCOPY WITH PROPOFOL  N/A 05/02/2022   Procedure: COLONOSCOPY WITH PROPOFOL ;  Surgeon:  Leigh Elspeth SQUIBB, MD;  Location: [facility] ENDOSCOPY;  Service: Gastroenterology;  Laterality: N/A;   COMPLEX WOUND CLOSURE  05/04/2023   Procedure: INTERMEDIATE WOUND CLOSURE;  Surgeon: Rodgers Faden, MD;  Location: [facility] OR;  Service: Ophthalmology;;   CORONARY ANGIOPLASTY  1991   CORONARY ARTERY BYPASS GRAFT N/A 02/18/2016   Procedure: CORONARY ARTERY BYPASS GRAFTING (CABG) times 2 using left internal mammary and right greater saphenous vein;  Surgeon: Sudie VEAR Laine, MD;  Location: [facility] OR;  Service: Open Heart Surgery;  Laterality: N/A;   EP IMPLANTABLE DEVICE N/A 06/16/2016   Procedure: ICD Implant;  Surgeon: Will Gladis Norton, MD;  Location: [facility] INVASIVE CV LAB;  Service: Cardiovascular;  Laterality: N/A;   LACRIMAL DUCT EXPLORATION Right 05/04/2023   Procedure: PLASTIC REPAIR OF CANALICULAR;  NASOLACRIMAL PROBE WITH TUBE OR STENT;  Surgeon: Rodgers Faden, MD;  Location: [facility] OR;  Service: Ophthalmology;  Laterality: Right;   LEFT HEART CATH AND CORS/GRAFTS ANGIOGRAPHY N/A 09/30/2021   Procedure: LEFT HEART CATH AND CORS/GRAFTS ANGIOGRAPHY;  Surgeon: BURNARD NED A;  Location: [facility] INVASIVE CV LAB;  Service: Cardiovascular;  Laterality: N/A;   LESION REMOVAL Right 05/04/2023   Procedure: MINOR EXCISION OF LESION 14440;  Surgeon: Rodgers Faden, MD;  Location: [facility] OR;  Service: Ophthalmology;  Laterality: Right;   LID LESION EXCISION Right 05/04/2023   Procedure: LID LESION EXCISION;  Surgeon: Rodgers Faden, MD;  Location: [facility] OR;  Service: Ophthalmology;  Laterality: Right;   POLYPECTOMY  05/02/2022   Procedure: POLYPECTOMY;  Surgeon: Leigh Elspeth SQUIBB, MD;  Location: [facility] ENDOSCOPY;  Service: Gastroenterology;;   RENAL ARTERY STENT Right 2005   RHINOPLASTY Bilateral 1968   SHOULDER SURGERY Right 2019   Dr Melita   TEAR DUCT PROBING Right 05/04/2023   Procedure: TEAR DUCT PROBING;  Surgeon: Rodgers Faden, MD;  Location: [facility] OR;  Service: Ophthalmology;  Laterality: Right;   TONSILLECTOMY  AND ADENOIDECTOMY  1950s    Allergies  No Known Allergies  Home Medications    Prior to Admission medications   Medication Sig Start Date End Date Taking? Authorizing Provider  albuterol  (VENTOLIN  HFA) 108 (90 Base) MCG/ACT inhaler Inhale 2 puffs into the lungs every 6 (six) hours as needed for wheezing or shortness of breath.    [provider]  aspirin  81 MG tablet Take 81 mg by mouth daily.    [provider]  calcium -vitamin D  (OSCAL WITH D) 500-200 MG-UNIT per tablet Take 1 tablet by mouth 2 (two) times daily.    [provider]  carboxymethylcellulose (REFRESH PLUS) 0.5 % SOLN Place 2 drops into both eyes in the morning, at noon, and at bedtime.    [provider]  Co-Enzyme Q10 200 MG CAPS Take 200 mg by mouth daily.    [provider]  diclofenac  Sodium (VOLTAREN ) 1 % GEL Apply 2 g topically 4 (four) times daily. Rub into affected area of foot 2 to 4 times daily 09/19/23   Gershon Donnice SAUNDERS, DPM  empagliflozin  (JARDIANCE ) 10 MG TABS tablet Take 1 tablet (10 mg total) by mouth daily before breakfast. 01/25/22  Goodrich, Callie E, PA-C  ENTRESTO  24-26 MG TAKE 1 TABLET TWICE A DAY Patient taking differently: Take 1 tablet by mouth 2 (two) times daily. 01/31/20   Lavona Agent, MD  Evolocumab  (REPATHA  SURECLICK) 140 MG/ML SOAJ Inject 140 mg into the skin every 14 (fourteen) days. 03/18/24   Lavona Agent, MD  furosemide  (LASIX ) 40 MG tablet Take 0.5 tablets (20 mg total) by mouth daily as needed for edema. 12/21/22   Lavona Agent, MD  isosorbide  mononitrate (IMDUR ) 120 MG 24 hr tablet Take 1 tablet (120 mg total) by mouth daily. 10/06/21   Lavona Agent, MD  Lifitegrast 5 % SOLN Apply to eye. 02/01/24   [provider]  Magnesium  Oxide 420 MG TABS Take 420 mg by mouth daily.    [provider]  Multiple Vitamin (MULTIVITAMIN) tablet Take 1 tablet by mouth daily.    [provider]  NITROSTAT  0.4 MG SL tablet  Place 1 tablet (0.4 mg total) under the tongue every 5 (five) minutes as needed for chest pain. 02/11/22   Lavona Agent, MD  potassium chloride  SA (KLOR-CON  M20) 20 MEQ tablet Take 1 tablet as needed when Lasix  is taken Patient taking differently: Take 20 mEq by mouth daily as needed (as needed when Lasix  is taken). 02/02/23   Lavona Agent, MD  rosuvastatin  (CRESTOR ) 40 MG tablet Take 1 tablet (40 mg total) by mouth daily. Patient taking differently: Take 40 mg by mouth at bedtime. 03/19/20   Lavona Agent, MD  tretinoin (RETIN-A) 0.05 % cream Apply 1 Application topically at bedtime.    [provider]  umeclidinium-vilanterol (ANORO ELLIPTA) 62.5-25 MCG/INH AEPB Inhale 1 puff into the lungs at bedtime.    [provider]    Physical Exam    Vital Signs:  Hailey Wright does not have vital signs available for review today.  Given telephonic nature of communication, physical exam is limited. AAOx3. NAD. Normal affect.  Speech and respirations are unlabored.  Accessory Clinical Findings    None  Assessment & Plan    1.  Preoperative Cardiovascular Risk Assessment:  Ms. Chandonnet perioperative risk of a major cardiac event is 6.6% according to the Revised Cardiac Risk Index (RCRI).  Therefore, she is at high risk for perioperative complications.   Her functional capacity is good at 6.36 METs according to the Duke Activity Status Index (DASI). Recommendations: According to ACC/AHA guidelines, no further cardiovascular testing needed.  The patient may proceed to surgery at acceptable risk.   Antiplatelet and/or Anticoagulation Recommendations: Aspirin  can be held for 5-7 days prior to her surgery.  Please resume Aspirin  post operatively when it is felt to be safe from a bleeding standpoint.    The patient was advised that if she develops new symptoms prior to surgery to contact our office to arrange for a follow-up visit, and she verbalized understanding.  A copy of  this note will be routed to requesting surgeon.  Time:   Today, I have spent 7 minutes with the patient with telehealth technology discussing medical history, symptoms, and management plan.     Orren LOISE Fabry, PA-C  09/03/2024, 2:42 PM

## 2024-09-12 ENCOUNTER — Encounter: Admitting: Podiatry

## 2024-09-12 ENCOUNTER — Ambulatory Visit

## 2024-09-12 DIAGNOSIS — I5022 Chronic systolic (congestive) heart failure: Secondary | ICD-10-CM

## 2024-09-12 LAB — CUP PACEART REMOTE DEVICE CHECK
Battery Remaining Longevity: 16 mo
Battery Remaining Percentage: 18 %
Battery Voltage: 2.75 V
Brady Statistic AP VP Percent: 1 %
Brady Statistic AP VS Percent: 1 %
Brady Statistic AS VP Percent: 1 %
Brady Statistic AS VS Percent: 98 %
Brady Statistic RA Percent Paced: 1 %
Brady Statistic RV Percent Paced: 1 %
Date Time Interrogation Session: 20251023020014
HighPow Impedance: 86 Ohm
HighPow Impedance: 86 Ohm
Implantable Lead Connection Status: 753985
Implantable Lead Connection Status: 753985
Implantable Lead Implant Date: 20170727
Implantable Lead Implant Date: 20170727
Implantable Lead Location: 753859
Implantable Lead Location: 753860
Implantable Pulse Generator Implant Date: 20170727
Lead Channel Impedance Value: 400 Ohm
Lead Channel Impedance Value: 440 Ohm
Lead Channel Pacing Threshold Amplitude: 0.5 V
Lead Channel Pacing Threshold Amplitude: 1 V
Lead Channel Pacing Threshold Pulse Width: 0.5 ms
Lead Channel Pacing Threshold Pulse Width: 0.5 ms
Lead Channel Sensing Intrinsic Amplitude: 12 mV
Lead Channel Sensing Intrinsic Amplitude: 3.6 mV
Lead Channel Setting Pacing Amplitude: 2 V
Lead Channel Setting Pacing Amplitude: 2.5 V
Lead Channel Setting Pacing Pulse Width: 0.5 ms
Lead Channel Setting Sensing Sensitivity: 0.5 mV
Pulse Gen Serial Number: 7261095
Zone Setting Status: 755011

## 2024-09-13 ENCOUNTER — Ambulatory Visit: Payer: Self-pay | Admitting: Cardiology

## 2024-09-13 NOTE — Progress Notes (Signed)
 Remote ICD Transmission

## 2024-09-16 ENCOUNTER — Ambulatory Visit: Attending: Cardiology

## 2024-09-16 ENCOUNTER — Ambulatory Visit: Admitting: Pharmacist

## 2024-09-16 DIAGNOSIS — Z95810 Presence of automatic (implantable) cardiac defibrillator: Secondary | ICD-10-CM | POA: Insufficient documentation

## 2024-09-16 DIAGNOSIS — I2510 Atherosclerotic heart disease of native coronary artery without angina pectoris: Secondary | ICD-10-CM | POA: Diagnosis not present

## 2024-09-16 DIAGNOSIS — I5022 Chronic systolic (congestive) heart failure: Secondary | ICD-10-CM | POA: Diagnosis not present

## 2024-09-16 DIAGNOSIS — E785 Hyperlipidemia, unspecified: Secondary | ICD-10-CM | POA: Diagnosis not present

## 2024-09-16 MED ORDER — EZETIMIBE 10 MG PO TABS: 10.0000 mg | ORAL_TABLET | Freq: Every day | ORAL | 3 refills | Status: DC

## 2024-09-16 MED ORDER — EZETIMIBE 10 MG PO TABS: 10.0000 mg | ORAL_TABLET | Freq: Every day | ORAL | 3 refills | Status: AC

## 2024-09-16 NOTE — Patient Instructions (Signed)
 Please resume rosuvastatin  40mg  daily and ezetimibe 10mg  daily  Please have labs rechecked sometimes in the month of January

## 2024-09-16 NOTE — Assessment & Plan Note (Signed)
 Assessment: Patient did not tolerate Repatha  due to voice hoarseness and runny nose Did fine on rosuvastatin  40 mg daily and ezetimibe 10 mg daily Reviewing her lab history it looks as though her LDL-C was well-controlled on this regimen Exercises for about an hour and a half 3 days a week Very active and loves to travel  Plan: Resume rosuvastatin  40 mg daily and ezetimibe 10 mg daily Recheck lipids in January

## 2024-09-16 NOTE — Progress Notes (Signed)
 Patient ID: Hailey Wright                 DOB: 11/03/1946                    MRN: [id number]      HPI: Hailey Wright is a 78 y.o. female patient referred to lipid clinic by [facility]. PMH is significant for CAD s/p MI and CABG with a LIMA to the LAD and SVG to OM1, HFrEF s/p ICD EF 30-35% in 2022.  In April 2025 she was switched from rosuvastatin  40mg  daily and zetia to Repatha . This was done due to an LDL-C on 105, however, this lab cannot be found. LDL-C in March with PCP was 49. Patient developed voice hoarseness with Repatha  and it was stopped.   Patient presents today to lipid clinic.  She reports having no issues with rosuvastatin  or Zetia when she was on this.  LDL-C was well-controlled in the 40s.  APO B also extremely well-controlled <60. She stopped the Repatha  just a few weeks ago.  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  Originally from [facility] [place] .   Current Medications: None Intolerances: Repatha  (voice hoarseness) Risk Factors: CAD LDL-C goal: <70  Diet: Not discussed today  Exercise: [employer] 3 days a week 1.5 hr weights and cardio  Family History:   Social History: no tobacco, former quit in 2012, occasional ETOH (wine)  Labs: Lipid Panel     Component Value Date/Time   CHOL 203 (H) 06/10/2024 1013   TRIG 72 06/10/2024 1013   HDL 105 06/10/2024 1013   CHOLHDL 1.9 06/10/2024 1013   LDLCALC 85 06/10/2024 1013   LABVLDL 13 06/10/2024 1013    Past Medical History:  Diagnosis Date   Acute ST elevation myocardial infarction (STEMI) involving left anterior descending coronary artery (HCC) 02/18/2016   AICD (automatic cardioverter/defibrillator) present    CAD (coronary artery disease) 06/04/2013   CHF (congestive heart failure) (HCC)    Clotting disorder    COPD (chronic obstructive pulmonary disease) (HCC)    Coronary artery thrombosis (HCC) 02/18/2016   DVT (deep venous thrombosis) (HCC) 1980s    RLE   Hepatitis A infection 1987   Hyperlipidemia    Hypertension    Ischemic cardiomyopathy    a. Echo 5/17: EF 25-30%, anteroseptal, anterior, anteroseptal and apical akinesis, possible inferior hypokinesis, grade 1 diastolic dysfunction, no evidence of thrombus, trivial AI, mild MR, mild LAE   MI (myocardial infarction) (HCC) 1991   Inferior treated with TPA 1991, POBA RCA 1996   Pneumonia 1980s X 1   RAS (renal artery stenosis)    Right renal stent 2005   S/P Emergency CABG x 2 02/18/2016   LIMA to LAD, SVG to OM1, EVH via right thigh   Type II diabetes mellitus (HCC)    pt denies    Current Outpatient Medications on File Prior to Visit  Medication Sig Dispense Refill   albuterol  (VENTOLIN  HFA) 108 (90 Base) MCG/ACT inhaler Inhale 2 puffs into the lungs every 6 (six) hours as needed for wheezing or shortness of breath.     aspirin  81 MG tablet Take 81 mg by mouth daily.     calcium -vitamin D  (OSCAL WITH D) 500-200 MG-UNIT per tablet Take 1 tablet by mouth 2 (two) times daily.     carboxymethylcellulose (REFRESH PLUS) 0.5 % SOLN Place 2 drops into both eyes in the morning, at noon, and  at bedtime.     Co-Enzyme Q10 200 MG CAPS Take 200 mg by mouth daily.     empagliflozin  (JARDIANCE ) 10 MG TABS tablet Take 1 tablet (10 mg total) by mouth daily before breakfast. 90 tablet 3   ENTRESTO  24-26 MG TAKE 1 TABLET TWICE A DAY 180 tablet 3   furosemide  (LASIX ) 40 MG tablet Take 0.5 tablets (20 mg total) by mouth daily as needed for edema. 30 tablet 5   isosorbide  mononitrate (IMDUR ) 120 MG 24 hr tablet Take 1 tablet (120 mg total) by mouth daily. 90 tablet 1   Magnesium  Oxide 420 MG TABS Take 420 mg by mouth daily.     Multiple Vitamin (MULTIVITAMIN) tablet Take 1 tablet by mouth daily.     NITROSTAT  0.4 MG SL tablet Place 1 tablet (0.4 mg total) under the tongue every 5 (five) minutes as needed for chest pain. 25 tablet 1   potassium chloride  SA (KLOR-CON  M20) 20 MEQ tablet Take 1 tablet as  needed when Lasix  is taken 30 tablet 2   umeclidinium-vilanterol (ANORO ELLIPTA) 62.5-25 MCG/INH AEPB Inhale 1 puff into the lungs at bedtime.     Lifitegrast 5 % SOLN Apply to eye.     rosuvastatin  (CRESTOR ) 40 MG tablet Take 1 tablet (40 mg total) by mouth daily. (Patient taking differently: Take 40 mg by mouth at bedtime.) 90 tablet 1   No current facility-administered medications on file prior to visit.    No Known Allergies  Assessment/Plan:  1. Hyperlipidemia -  Hyperlipidemia Assessment: Patient did not tolerate Repatha  due to voice hoarseness and runny nose Did fine on rosuvastatin  40 mg daily and ezetimibe 10 mg daily Reviewing her lab history it looks as though her LDL-C was well-controlled on this regimen Exercises for about an hour and a half 3 days a week Very active and loves to travel  Plan: Resume rosuvastatin  40 mg daily and ezetimibe 10 mg daily Recheck lipids in January    Thank you,  Harlene Petralia D Meeah Totino, [place].JONETTA SARAN, CPP [facility] A Division of [facility], [place] [address], [place], [place] [address]  Phone: [phone]; Fax: [phone]

## 2024-09-17 NOTE — Progress Notes (Signed)
 Dr. Jameson office notified that there is no H&P within 30 days.  Marissa at Dr. Jameson office stated that case will need to be rescheduled so patient can be seen by her PCP.

## 2024-09-18 ENCOUNTER — Ambulatory Visit (HOSPITAL_COMMUNITY): Admission: RE | Admit: 2024-09-18 | Source: Home / Self Care | Admitting: Podiatry

## 2024-09-18 ENCOUNTER — Encounter (HOSPITAL_COMMUNITY): Admission: RE | Payer: Self-pay | Source: Home / Self Care

## 2024-09-18 SURGERY — CORRECTION, HAMMER TOE
Anesthesia: Choice | Site: Toe | Laterality: Right

## 2024-09-18 NOTE — Progress Notes (Signed)
 EPIC Encounter for ICM Monitoring  Patient Name: Hailey Wright is a 78 y.o. female Date: 09/18/2024 Primary Care Physican: Yolande Toribio MATSU, MD Primary Cardiologist: Hochrein Electrophysiologist: Inocencio 11/24/2023 Weight:  148 lbs 12/28/2023 Weight: 145 lbs 01/30/2024 Weight: 146 lbs 02/06/2024 Weight: 143 lbs 03/19/2024 Weight: 147 lbs 06/05/2024 Weight: 159 lbs 09/18/2024 Weight: 148 lbs   Spoke with patient and heart failure questions reviewed.  Transmission results reviewed.  Pt asymptomatic for fluid accumulation.  Reports feeling well at this time and voices no complaints.  She developed side effect of hoarseness from taking Repatha  which has been discontinued.   Since 08/12/2024 ICM Remote Transmission:  [facility] thoracic impedance suggesting normal fluid levels with the exception of possible fluid accumulation from  08/20/2024-08/27/24.   Prescribed:  Furosemide  40 mg 0.5 tablet (20 mg total) by mouth daily as needed.   Potassium 20 mEq take 1 tablet as needed when taking Lasix    Labs: 09/18/2023 Creatinine 0.86, BUN 18, Potassium 4.8, Sodium 144, GFR 70 05/04/2023 Creatinine 0.90, BUN 15, Potassium 3.7, Sodium 140, GFR >60 A complete set of results can be found in Results Review.   Recommendations:  No changes and encouraged to call if experiencing any fluid symptoms.   Follow-up plan: ICM clinic phone appointment on 10/21/2024.   91 day device clinic remote transmission 12/12/2024.     EP/Cardiology Office Visit:   Recall 02/02/2025 with Dr. Lavona.   01/15/2025 with Dr Inocencio.   Copy of ICM check sent to Dr. Inocencio.  Remote monitoring is medically necessary for Heart Failure Management.    Daily Thoracic Impedance ICM trend: 06/18/2024 through 09/16/2024.    12-14 Month Thoracic Impedance ICM trend:     [name], RN 09/18/2024 8:14 AM

## 2024-09-19 DIAGNOSIS — M2041 Other hammer toe(s) (acquired), right foot: Secondary | ICD-10-CM | POA: Diagnosis not present

## 2024-09-19 DIAGNOSIS — Z01818 Encounter for other preprocedural examination: Secondary | ICD-10-CM | POA: Diagnosis not present

## 2024-09-19 DIAGNOSIS — I130 Hypertensive heart and chronic kidney disease with heart failure and stage 1 through stage 4 chronic kidney disease, or unspecified chronic kidney disease: Secondary | ICD-10-CM | POA: Diagnosis not present

## 2024-09-19 DIAGNOSIS — Z95810 Presence of automatic (implantable) cardiac defibrillator: Secondary | ICD-10-CM | POA: Diagnosis not present

## 2024-09-19 DIAGNOSIS — N1831 Chronic kidney disease, stage 3a: Secondary | ICD-10-CM | POA: Diagnosis not present

## 2024-09-19 DIAGNOSIS — I2510 Atherosclerotic heart disease of native coronary artery without angina pectoris: Secondary | ICD-10-CM | POA: Diagnosis not present

## 2024-09-19 DIAGNOSIS — I5022 Chronic systolic (congestive) heart failure: Secondary | ICD-10-CM | POA: Diagnosis not present

## 2024-09-23 ENCOUNTER — Encounter

## 2024-09-23 ENCOUNTER — Other Ambulatory Visit: Payer: Self-pay

## 2024-09-23 ENCOUNTER — Encounter: Payer: Self-pay | Admitting: Cardiology

## 2024-09-23 ENCOUNTER — Encounter (HOSPITAL_COMMUNITY): Payer: Self-pay | Admitting: Podiatry

## 2024-09-23 NOTE — Progress Notes (Addendum)
 PCP - Yolande Toribio MATSU, MD  Cardiologist Lavona Agent, MD  (LOV 09-03-24 Lucien Orren LOISE DEVONNA)   Electrophysiology - Cardiology Inocencio Soyla Lunger, MD   PPM/ICD - AICD  Device Orders - requested Rep Notified - 09-24-24 Yes Redell  aware at 844 am.  Chest x-ray -  Chest CT 06-03-24 EKG - DOS Stress Test - 2016 ECHO - 11-18-21 Cardiac Cath - 09-30-21  CPAP - denies  DM -denies empagliflozin  (JARDIANCE ) per patient she takes for heart failure  Blood Thinner Instructions: denies Aspirin  Instructions: Last dose 09-11-24  ERAS Protcol - NPO  COVID TEST- n/a  Anesthesia review: Yes, Hx of MI, CHF, CAD, DM  Patient verbally denies any shortness of breath, fever, cough and chest pain during phone call   -------------  SDW INSTRUCTIONS given:  Your procedure is scheduled on September 25, 2024.  Report to [facility] Main Entrance A at 6:30 A.M., and check in at the Admitting office.  Call this number if you have problems the morning of surgery:  [phone]   Remember:  Do not eat or drink after midnight the night before your surgery    Take these medicines the morning of surgery with A SIP OF WATER   acetaminophen  (TYLENOL )  albuterol  (VENTOLIN  HFA) inhaler  ezetimibe (ZETIA)  isosorbide  mononitrate (IMDUR )  NITROSTAT    As of today, STOP taking any Aspirin  (unless otherwise instructed by your surgeon) Aleve, Naproxen, Ibuprofen, Motrin, Advil, Goody's, BC's, all herbal medications, fish oil, and all vitamins.                      Do not wear jewelry, make up, or nail polish            Do not wear lotions, powders, perfumes/colognes, or deodorant.            Do not shave 48 hours prior to surgery.  Men may shave face and neck.            Do not bring valuables to the hospital.            [facility] is not responsible for any belongings or valuables.  Do NOT Smoke (Tobacco/Vaping) 24 hours prior to your procedure If you use a CPAP at night, you may bring all  equipment for your overnight stay.   Contacts, glasses, dentures or bridgework may not be worn into surgery.      For patients admitted to the hospital, discharge time will be determined by your treatment team.   Patients discharged the day of surgery will not be allowed to drive home, and someone needs to stay with them for 24 hours.    Special instructions:   [place]- Preparing For Surgery  Before surgery, you can play an important role. Because skin is not sterile, your skin needs to be as free of germs as possible. You can reduce the number of germs on your skin by washing with CHG (chlorahexidine gluconate) Soap before surgery.  CHG is an antiseptic cleaner which kills germs and bonds with the skin to continue killing germs even after washing.    Oral Hygiene is also important to reduce your risk of infection.  Remember - BRUSH YOUR TEETH THE MORNING OF SURGERY WITH YOUR REGULAR TOOTHPASTE  Please do not use if you have an allergy to CHG or antibacterial soaps. If your skin becomes reddened/irritated stop using the CHG.  Do not shave (including legs and underarms) for at least 48 hours prior  to first CHG shower. It is OK to shave your face.  Please follow these instructions carefully.   Shower the NIGHT BEFORE SURGERY and the MORNING OF SURGERY with DIAL Soap.   Pat yourself dry with a CLEAN TOWEL.  Wear CLEAN PAJAMAS to bed the night before surgery  Place CLEAN SHEETS on your bed the night of your first shower and DO NOT SLEEP WITH PETS.   Day of Surgery: Please shower morning of surgery  Wear Clean/Comfortable clothing the morning of surgery Do not apply any deodorants/lotions.   Remember to brush your teeth WITH YOUR REGULAR TOOTHPASTE.   Questions were answered. Patient verbalized understanding of instructions.

## 2024-09-23 NOTE — Progress Notes (Signed)
 PERIOPERATIVE PRESCRIPTION FOR IMPLANTED CARDIAC DEVICE PROGRAMMING  Patient Information: Name:  Hailey Wright  DOB:  11-04-1946  MRN:  [id number]  Planned Procedure:  Right second hammer toe repair  Surgeon:  Dr. Gershon  Date of Procedure:  09-25-24  Cautery will be used.  Position during surgery:  supine   Please send documentation back to:  [facility] (Fax # [phone])  Device Information:  Clinic EP Physician:  Soyla Norton, MD   Device Type:  Defibrillator Manufacturer and Phone #:  St. Jude/Abbott: [phone] Pacemaker Dependent?:  No. Date of Last Device Check:  09/16/24 Normal Device Function?:  Yes.    Electrophysiologist's Recommendations:  Have magnet available. Provide continuous ECG monitoring when magnet is used or reprogramming is to be performed.  Procedure should not interfere with device function.  No device programming or magnet placement needed.  Per Device Clinic Standing Orders, Hailey JINNY Silvan, RN  5:27 PM 09/23/2024

## 2024-09-24 NOTE — Anesthesia Preprocedure Evaluation (Addendum)
 Anesthesia Evaluation    Reviewed: Allergy & Precautions, Patient's Chart, lab work & pertinent test results  Airway Mallampati: II  TM Distance: >3 FB Neck ROM: Full    Dental  (+) Dental Advisory Given   Pulmonary COPD,  COPD inhaler, former smoker   breath sounds clear to auscultation       Cardiovascular hypertension, Pt. on medications and Pt. on home beta blockers + CAD, + Past MI, + Peripheral Vascular Disease and +CHF  + Cardiac Defibrillator  Rhythm:Regular Rate:Normal  Device Information: Clinic EP Physician:  Soyla Norton, MD  Device Type:  Defibrillator Manufacturer and Phone #:  St. Jude/Abbott: [phone] Pacemaker Dependent?:  No. Date of Last Device Check:  09/16/24         Normal Device Function?:  Yes.     Electrophysiologist's Recommendations:  Have magnet available.  Provide continuous ECG monitoring when magnet is used or reprogramming is to be performed.   Procedure should not interfere with device function.  No device programming or magnet placement needed.    Neuro/Psych negative neurological ROS     GI/Hepatic negative GI ROS, Neg liver ROS,,,  Endo/Other  diabetes    Renal/GU negative Renal ROS     Musculoskeletal   Abdominal   Peds  Hematology negative hematology ROS (+)   Anesthesia Other Findings   Reproductive/Obstetrics                              Anesthesia Physical Anesthesia Plan  ASA: 4  Anesthesia Plan: MAC   Post-op Pain Management: Tylenol  PO (pre-op)*   Induction: Intravenous  PONV Risk Score and Plan: 2 and Propofol  infusion, Ondansetron  and Treatment may vary due to age or medical condition  Airway Management Planned: Natural Airway and Nasal Cannula  Additional Equipment:   Intra-op Plan:   Post-operative Plan:   Informed Consent: I have reviewed the patients History and Physical, chart, labs and discussed the procedure  including the risks, benefits and alternatives for the proposed anesthesia with the patient or authorized representative who has indicated his/her understanding and acceptance.       Plan Discussed with: Anesthesiologist  Anesthesia Plan Comments: (PAT note written 09/24/2024 by Isaiah Ruder, PA-C.  DISCUSSION: Patient is a 78 year old female scheduled for the above procedure.   History includes former smoker (quit 2012), CAD/MI (inferior MI 1991 s/p TPA, POBA RCA 1996, anterior STEMI s/p emergency CABG 02/18/2016: LIMA-LAD, SVG-OM1, EF 30%), ischemic cardiomyopathy, chronic systolic CHF, St. Jude dual chamber ICD (06/16/2016), HLD, COPD, RLE DVT (1980's), DM2 (diet controlled), CKD (stage 2), renal artery stenosis (s/p right renal artery stent 2005), rhinoplasty. She denied DM2 history, last A1c was in the pre-diabetes range 6.3% 09/05/2023 [facility] Everywhere).    She had a preoperative evaluation/H&P on 09/19/2024 with Sammi Grate, FNP (see [facility] Everywhere). Visit included a CMP and CBC that showed gluocse 97, BUN 17, Creatinine 0.9, eGFR 60.9, Na 141, K 4.8, CO2 30, Ca 9.8, WBC 6.30, H/H 16.3/49.0, PLT 168. She wrote, Given low risk procedure, with planned local anesthesia and controlled comorbidities, labs today reveal no anemia or acute renal changes. Therefore, patient is medically cleared to go forward with hammer toe surgery with Dr. Gershon.    She also has cardiology preoperative input outlined on 09/03/2024 by Lucien Blanc, PA-C: Ms. Gladstone perioperative risk of a major cardiac event is 6.6% according to the Revised Cardiac Risk Index (RCRI).  Therefore, she is at high risk for perioperative complications. Her functional capacity is good at 6.36 METs according to the Duke Activity Status Index (DASI).Recommendations:According to ACC/AHA guidelines, no further cardiovascular testing needed. The patient may proceed to surgery at acceptable risk.  Antiplatelet and/or Anticoagulation Recommendations:Aspirin  can be held for 5-7 days prior to her surgery. Please resume Aspirin  post operatively when it is felt to be safe from a bleeding standpoint.     EKG: 09/18/2023: Normal sinus rhythm Possible Left atrial enlargement Left bundle branch block Confirmed by Lesia Sharper [phone]) on 09/18/2023 11:56:52 AM     CV: Echo 11/18/2021: IMPRESSIONS   1. Left ventricular ejection fraction, by estimation, is 30 to 35%. The  left ventricle has moderately decreased function. The left ventricle  demonstrates regional wall motion abnormalities (see scoring  diagram/findings for description). Left ventricular   diastolic parameters are consistent with Grade I diastolic dysfunction  (impaired relaxation).   2. Right ventricular systolic function is normal. The right ventricular  size is normal. There is normal pulmonary artery systolic pressure. The  estimated right ventricular systolic pressure is 23.8 mmHg.   3. The mitral valve is grossly normal. Trivial mitral valve  regurgitation. No evidence of mitral stenosis.   4. Tricuspid valve regurgitation is mild to moderate.   5. The aortic valve is tricuspid. There is mild calcification of the  aortic valve. There is mild thickening of the aortic valve. Aortic valve  regurgitation is not visualized. Aortic valve sclerosis is present, with  no evidence of aortic valve stenosis.   6. The inferior vena cava is normal in size with greater than 50%  respiratory variability, suggesting right atrial pressure of 3 mmHg.  - Comparison(s): Changes from prior study are noted. EF now 30-35%. RWMA remain suggesting LAD infarction.  - Comparison 06/06/2018: mild LVH, LVEF 35-40%, akinesis of mid anteroseptal, basal inferior, and apical myocardium, moderate hypokinesis of the basal anteroseptal myocardium, mild MR, LA moderately to severely dilated, normal RV systolic function, no PFO/AST noted, mild-moderate  TR; 06/05/2017 LVEF 35-40%, severe hypokinesis of the mid-apicalanteroseptal and apical myocardium; akinesis of the basalinferior myocardium, mild MR, mild-moderate TR; 05/20/2016 & 04/01/2016 LVEF 25-30%    )        Anesthesia Quick Evaluation

## 2024-09-24 NOTE — Progress Notes (Signed)
 Anesthesia Chart Review: SAME DAY WORK-UP  Case: [id number] Date/Time: 09/25/24 0815   Procedure: CORRECTION, HAMMER TOE (Right: Toe) - 2nd toe   Anesthesia type: Choice   Diagnosis: Hammer toe of right foot [M20.41]   Pre-op diagnosis: Hammer toe right foot, 2nd toe   [facility] ROOM [facility] / [facility]   Surgeons: Gershon Donnice SAUNDERS, DPM       DISCUSSION: Patient is a 78 year old female scheduled for the above procedure.  History includes former smoker (quit 2012), CAD/MI (inferior MI 1991 s/p TPA, POBA RCA 1996, anterior STEMI s/p emergency CABG 02/18/2016: LIMA-LAD, SVG-OM1, EF 30%), ischemic cardiomyopathy, chronic systolic CHF, St. Jude dual chamber ICD (06/16/2016), HLD, COPD, RLE DVT (1980's), DM2 (diet controlled), CKD (stage 2), renal artery stenosis (s/p right renal artery stent 2005), rhinoplasty. She denied DM2 history, last A1c was in the pre-diabetes range 6.3% 09/05/2023 [facility] Everywhere).    She had a preoperative evaluation/H&P on 09/19/2024 with Sammi Grate, FNP (see [facility] Everywhere). Visit included a CMP and CBC that showed gluocse 97, BUN 17, Creatinine 0.9, eGFR 60.9, Na 141, K 4.8, CO2 30, Ca 9.8, WBC 6.30, H/H 16.3/49.0, PLT 168. She wrote, Given low risk procedure, with planned local anesthesia and controlled comorbidities, labs today reveal no anemia or acute renal changes. Therefore, patient is medically cleared to go forward with hammer toe surgery with Dr. Gershon.   She also has cardiology preoperative input outlined on 09/03/2024 by Lucien Blanc, PA-C: Ms. Gladstone perioperative risk of a major cardiac event is 6.6% according to the Revised Cardiac Risk Index (RCRI). Therefore, she is at high risk for perioperative complications. Her functional capacity is good at 6.36 METs according to the Duke Activity Status Index (DASI).Recommendations:According to ACC/AHA guidelines, no further cardiovascular testing needed. The patient may  proceed to surgery at acceptable risk. Antiplatelet and/or Anticoagulation Recommendations:Aspirin  can be held for 5-7 days prior to her surgery. Please resume Aspirin  post operatively when it is felt to be safe from a bleeding standpoint.  EP Perioperative ICD recommendations: Device Information: Clinic EP Physician:  Soyla Norton, MD  Device Type:  Defibrillator Manufacturer and Phone #:  St. Jude/Abbott: [phone] Pacemaker Dependent?:  No. Date of Last Device Check:  09/16/24         Normal Device Function?:  Yes.     Electrophysiologist's Recommendations: Have magnet available. Provide continuous ECG monitoring when magnet is used or reprogramming is to be performed.  Procedure should not interfere with device function.  No device programming or magnet placement needed.  She is on Jardiance  for HF. Last ASA reported as 09/11/2024.   Anesthesia team to evaluate on the day of surgery. Last EKG noted is just over a year old.    VS: Ht 5' 6 (1.676 m)   Wt 66.2 kg   LMP  (LMP Unknown)   BMI 23.56 kg/m  09/19/2024: Wt: 151.6 lbs, Ht: 65 in, BMI: 25.22 Index, WT CHG: 0.4 lbs, Temp: 97.9 F, HR: 80 /min, BP: 120/62 mm Hg, Oxygen  sat %: 98 %, Body Surface Area: 1.77, Wt-kg: 68.77 kg.  PROVIDERS: Yolande Toribio MATSU, MD is PCP  Lavona Agent, MD is cardiologist Norton Soyla, MD is EP cardiologist   LABS: As above, she has an up-to-date CBC and BMP from 09/19/2024 done at [facility]. Results can be viewed in Care Everywhere.    IMAGES: CT Chest LCS 06/03/2024: IMPRESSION: 1. Lung-RADS 2, benign appearance or behavior. Continue annual screening  with low-dose chest CT without contrast in 12 months. 2. Aortic Atherosclerosis (ICD10-I70.0) and Emphysema (ICD10-J43.9). Coronary artery calcifications. Assessment for potential risk factor modification, dietary therapy or pharmacologic therapy may be warranted, if clinically indicated.    EKG:  09/18/2023: Normal sinus rhythm Possible Left atrial enlargement Left bundle branch block Confirmed by Lesia Sharper [phone]) on 09/18/2023 11:56:52 AM   CV: Echo 11/18/2021: IMPRESSIONS   1. Left ventricular ejection fraction, by estimation, is 30 to 35%. The  left ventricle has moderately decreased function. The left ventricle  demonstrates regional wall motion abnormalities (see scoring  diagram/findings for description). Left ventricular   diastolic parameters are consistent with Grade I diastolic dysfunction  (impaired relaxation).   2. Right ventricular systolic function is normal. The right ventricular  size is normal. There is normal pulmonary artery systolic pressure. The  estimated right ventricular systolic pressure is 23.8 mmHg.   3. The mitral valve is grossly normal. Trivial mitral valve  regurgitation. No evidence of mitral stenosis.   4. Tricuspid valve regurgitation is mild to moderate.   5. The aortic valve is tricuspid. There is mild calcification of the  aortic valve. There is mild thickening of the aortic valve. Aortic valve  regurgitation is not visualized. Aortic valve sclerosis is present, with  no evidence of aortic valve stenosis.   6. The inferior vena cava is normal in size with greater than 50%  respiratory variability, suggesting right atrial pressure of 3 mmHg.  - Comparison(s): Changes from prior study are noted. EF now 30-35%. RWMA remain suggesting LAD infarction.  - Comparison 06/06/2018: mild LVH, LVEF 35-40%, akinesis of mid anteroseptal, basal inferior, and apical myocardium, moderate hypokinesis of the basal anteroseptal myocardium, mild MR, LA moderately to severely dilated, normal RV systolic function, no PFO/AST noted, mild-moderate TR; 06/05/2017 LVEF 35-40%, severe hypokinesis of the mid-apicalanteroseptal and apical myocardium; akinesis of the basalinferior myocardium, mild MR, mild-moderate TR; 05/20/2016 & 04/01/2016 LVEF 25-30%    Cardiac  cath 09/30/2021:    Mid LM to Dist LM lesion is 99% stenosed.   Ost Cx to Prox Cx lesion is 100% stenosed.   Dist LM to Prox LAD lesion is 100% stenosed.   Prox RCA lesion is 100% stenosed.   Mid RCA lesion is 85% stenosed.   1st Mrg lesion is 20% stenosed.   Prox Cx lesion is 20% stenosed.   2nd Mrg lesion is 20% stenosed.   Dist LAD lesion is 20% stenosed.   Mid LAD lesion is 20% stenosed.   There is severe left ventricular systolic dysfunction.   LV end diastolic pressure is low.   The left ventricular ejection fraction is less than 25% by visual estimate.   Severe multivessel CAD with subtotal stenosis/occlusion of the left main with total ostial LAD occlusion and total circumflex occlusion with faint filling of a diminutive ramus vessel. Chronic total occlusion of the RCA with proximal occlusion and bridging collaterals supplying an ectatic mid segment with diffuse disease beyond the ectasia and antegrade filling of the PDA and PL vessel.   Widely patent LIMA supplying a large caliber LAD vessel with minimal luminal narrowing in the LAD.   Widely patent SVG supplying a large caliber OM1 vessel with retrograde filling of the AV groove and distal circumflex via this graft.   Severe global LV dysfunction with EF estimate approximately 20%; LVEDP 6 mmHg.   RECOMMENDATION: Patient was recently started on clopidogrel  by Dr. Lavona; continue aspirin /Plavix .  In light of the patient's recent  increased angina, recommend increasing anti-ischemic medications with possible increase of isosorbide , or potential initiation Ranexa or amlodipine.  Recommend follow-up 2D echo Doppler study and as blood pressure allows further titration of Entresto  and additional GDMT for severe LV dysfunction.   US  Carotid 03/20/2020: Summary:  - Right Carotid: Velocities in the right ICA are consistent with a 1-39% stenosis.  - Left Carotid: Velocities in the left ICA are consistent with a 1-39%  stenosis.  -  Vertebrals: Bilateral vertebral arteries demonstrate antegrade flow.  - Subclavians: Normal flow hemodynamics were seen in bilateral subclavian arteries.  - Suggest follow up study in PRN.    Past Medical History:  Diagnosis Date   Acute ST elevation myocardial infarction (STEMI) involving left anterior descending coronary artery (HCC) 02/18/2016   AICD (automatic cardioverter/defibrillator) present    CAD (coronary artery disease) 06/04/2013   CHF (congestive heart failure) (HCC)    Clotting disorder    COPD (chronic obstructive pulmonary disease) (HCC)    Coronary artery thrombosis (HCC) 02/18/2016   DVT (deep venous thrombosis) (HCC) 1980s   RLE   Hepatitis A infection 1987   Hyperlipidemia    Hypertension    Ischemic cardiomyopathy    a. Echo 5/17: EF 25-30%, anteroseptal, anterior, anteroseptal and apical akinesis, possible inferior hypokinesis, grade 1 diastolic dysfunction, no evidence of thrombus, trivial AI, mild MR, mild LAE   MI (myocardial infarction) (HCC) 1991   Inferior treated with TPA 1991, POBA RCA 1996   Pneumonia 1980s X 1   RAS (renal artery stenosis)    Right renal stent 2005   S/P Emergency CABG x 2 02/18/2016   LIMA to LAD, SVG to OM1, EVH via right thigh   Type II diabetes mellitus (HCC)    pt denies    Past Surgical History:  Procedure Laterality Date   ADJACENT TISSUE TRANSFER/TISSUE REARRANGEMENT Right 05/04/2023   Procedure: ADJACENT TISSUE TRANSFER/TISSUE REARRANGEMENT;  Surgeon: Rodgers Faden, MD;  Location: [facility] OR;  Service: Ophthalmology;  Laterality: Right;   CANTHOPLASTY Right 05/04/2023   Procedure: MEDIAL CANTHOPLASTY;  Surgeon: Rodgers Faden, MD;  Location[facility] OR;  Service: Ophthalmology;  Laterality: Right;   CARDIAC CATHETERIZATION N/A 02/18/2016   Procedure: Left Heart Cath and Coronary Angiography;  Surgeon: Alm LELON Clay, MD;  Location: [facility] INVASIVE CV LAB;  Service: Cardiovascular;  Laterality: N/A;   COLONOSCOPY WITH PROPOFOL  N/A  05/02/2022   Procedure: COLONOSCOPY WITH PROPOFOL ;  Surgeon: Leigh Elspeth SQUIBB, MD;  Location: [facility] ENDOSCOPY;  Service: Gastroenterology;  Laterality: N/A;   COMPLEX WOUND CLOSURE  05/04/2023   Procedure: INTERMEDIATE WOUND CLOSURE;  Surgeon: Rodgers Faden, MD;  Location: [facility] OR;  Service: Ophthalmology;;   CORONARY ANGIOPLASTY  1991   CORONARY ARTERY BYPASS GRAFT N/A 02/18/2016   Procedure: CORONARY ARTERY BYPASS GRAFTING (CABG) times 2 using left internal mammary and right greater saphenous vein;  Surgeon: Sudie VEAR Laine, MD;  Location: [facility] OR;  Service: Open Heart Surgery;  Laterality: N/A;   EP IMPLANTABLE DEVICE N/A 06/16/2016   Procedure: ICD Implant;  Surgeon: Will Gladis Norton, MD;  Location: [facility] INVASIVE CV LAB;  Service: Cardiovascular;  Laterality: N/A;   LACRIMAL DUCT EXPLORATION Right 05/04/2023   Procedure: PLASTIC REPAIR OF CANALICULAR;  NASOLACRIMAL PROBE WITH TUBE OR STENT;  Surgeon: Rodgers Faden, MD;  Location: [facility] OR;  Service: Ophthalmology;  Laterality: Right;   LEFT HEART CATH AND CORS/GRAFTS ANGIOGRAPHY N/A 09/30/2021   Procedure: LEFT HEART CATH AND CORS/GRAFTS ANGIOGRAPHY;  Surgeon: BURNARD NED A;  Location: [facility]  INVASIVE CV LAB;  Service: Cardiovascular;  Laterality: N/A;   LESION REMOVAL Right 05/04/2023   Procedure: MINOR EXCISION OF LESION 14440;  Surgeon: Rodgers Faden, MD;  [facility];  Service: Ophthalmology;  Laterality: Right;   LID LESION EXCISION Right 05/04/2023   Procedure: LID LESION EXCISION;  Surgeon: Rodgers Faden, MD;  Location: [facility] OR;  Service: Ophthalmology;  Laterality: Right;   POLYPECTOMY  05/02/2022   Procedure: POLYPECTOMY;  Surgeon: Leigh Elspeth SQUIBB, MD;  Location: [facility];  Service: Gastroenterology;;   RENAL ARTERY STENT Right 2005   RHINOPLASTY Bilateral 1968   SHOULDER SURGERY Right 2019   Dr Melita   TEAR DUCT PROBING Right 05/04/2023   Procedure: TEAR DUCT PROBING;  Surgeon: Rodgers Faden, MD;  Location: [facility] OR;   Service: Ophthalmology;  Laterality: Right;   TONSILLECTOMY AND ADENOIDECTOMY  1950s    MEDICATIONS: No current facility-administered medications for this encounter.    acetaminophen  (TYLENOL ) 500 MG tablet   albuterol  (VENTOLIN  HFA) 108 (90 Base) MCG/ACT inhaler   aspirin  81 MG tablet   calcium -vitamin D  (OSCAL WITH D) 500-200 MG-UNIT per tablet   carboxymethylcellulose (REFRESH PLUS) 0.5 % SOLN   Co-Enzyme Q10 200 MG CAPS   empagliflozin  (JARDIANCE ) 10 MG TABS tablet   ENTRESTO  24-26 MG   ezetimibe (ZETIA) 10 MG tablet   furosemide  (LASIX ) 40 MG tablet   isosorbide  mononitrate (IMDUR ) 120 MG 24 hr tablet   Lifitegrast 5 % SOLN   Magnesium  Oxide 420 MG TABS   Multiple Vitamin (MULTIVITAMIN) tablet   NITROSTAT  0.4 MG SL tablet   potassium chloride  SA (KLOR-CON  M20) 20 MEQ tablet   rosuvastatin  (CRESTOR ) 40 MG tablet   umeclidinium-vilanterol (ANORO ELLIPTA) 62.5-25 MCG/INH AEPB    Isaiah Ruder, PA-C Surgical Short Stay/Anesthesiology [facility] Phone [phone] [facility] Phone [phone] 09/24/2024 12:43 PM

## 2024-09-25 ENCOUNTER — Encounter (HOSPITAL_COMMUNITY): Payer: Self-pay | Admitting: Vascular Surgery

## 2024-09-25 ENCOUNTER — Encounter (HOSPITAL_COMMUNITY)
Admission: RE | Disposition: A | Discharge status: Home / Self Care | Payer: Self-pay | Source: Home / Self Care | Attending: Podiatry

## 2024-09-25 ENCOUNTER — Ambulatory Visit (HOSPITAL_COMMUNITY)

## 2024-09-25 ENCOUNTER — Ambulatory Visit (HOSPITAL_BASED_OUTPATIENT_CLINIC_OR_DEPARTMENT_OTHER): Payer: Self-pay | Admitting: Vascular Surgery

## 2024-09-25 ENCOUNTER — Ambulatory Visit (HOSPITAL_COMMUNITY)
Admission: RE | Admit: 2024-09-25 | Discharge: 2024-09-25 | Disposition: A | Discharge status: Home / Self Care | Attending: Podiatry | Admitting: Podiatry

## 2024-09-25 DIAGNOSIS — E785 Hyperlipidemia, unspecified: Secondary | ICD-10-CM | POA: Insufficient documentation

## 2024-09-25 DIAGNOSIS — M2041 Other hammer toe(s) (acquired), right foot: Secondary | ICD-10-CM | POA: Diagnosis not present

## 2024-09-25 DIAGNOSIS — E1122 Type 2 diabetes mellitus with diabetic chronic kidney disease: Secondary | ICD-10-CM | POA: Diagnosis not present

## 2024-09-25 DIAGNOSIS — Z79899 Other long term (current) drug therapy: Secondary | ICD-10-CM | POA: Diagnosis not present

## 2024-09-25 DIAGNOSIS — I447 Left bundle-branch block, unspecified: Secondary | ICD-10-CM | POA: Diagnosis not present

## 2024-09-25 DIAGNOSIS — J439 Emphysema, unspecified: Secondary | ICD-10-CM | POA: Diagnosis not present

## 2024-09-25 DIAGNOSIS — I130 Hypertensive heart and chronic kidney disease with heart failure and stage 1 through stage 4 chronic kidney disease, or unspecified chronic kidney disease: Secondary | ICD-10-CM | POA: Diagnosis not present

## 2024-09-25 DIAGNOSIS — I252 Old myocardial infarction: Secondary | ICD-10-CM | POA: Diagnosis not present

## 2024-09-25 DIAGNOSIS — N1831 Chronic kidney disease, stage 3a: Secondary | ICD-10-CM | POA: Diagnosis not present

## 2024-09-25 DIAGNOSIS — I2510 Atherosclerotic heart disease of native coronary artery without angina pectoris: Secondary | ICD-10-CM | POA: Diagnosis not present

## 2024-09-25 DIAGNOSIS — E1151 Type 2 diabetes mellitus with diabetic peripheral angiopathy without gangrene: Secondary | ICD-10-CM | POA: Diagnosis not present

## 2024-09-25 DIAGNOSIS — Z87891 Personal history of nicotine dependence: Secondary | ICD-10-CM | POA: Diagnosis not present

## 2024-09-25 DIAGNOSIS — I509 Heart failure, unspecified: Secondary | ICD-10-CM | POA: Insufficient documentation

## 2024-09-25 DIAGNOSIS — I700 Atherosclerosis of aorta: Secondary | ICD-10-CM | POA: Insufficient documentation

## 2024-09-25 DIAGNOSIS — M2042 Other hammer toe(s) (acquired), left foot: Secondary | ICD-10-CM | POA: Diagnosis not present

## 2024-09-25 DIAGNOSIS — Z7984 Long term (current) use of oral hypoglycemic drugs: Secondary | ICD-10-CM | POA: Insufficient documentation

## 2024-09-25 HISTORY — PX: HAMMER TOE SURGERY: SHX385

## 2024-09-25 LAB — GLUCOSE, CAPILLARY
Glucose-Capillary: 99 mg/dL (ref 70–99)
Glucose-Capillary: 121 mg/dL — ABNORMAL HIGH (ref 70–99)

## 2024-09-25 SURGERY — CORRECTION, HAMMER TOE
Anesthesia: Monitor Anesthesia Care | Site: Toe | Laterality: Right

## 2024-09-25 MED ORDER — PHENYLEPHRINE HCL-NACL 20-0.9 MG/250ML-% IV SOLN
INTRAVENOUS | Status: DC | PRN
  Administered 2024-09-25: 55 ug/min via INTRAVENOUS

## 2024-09-25 MED ORDER — ONDANSETRON HCL 4 MG PO TABS: 4.0000 mg | ORAL_TABLET | Freq: Three times a day (TID) | ORAL | 0 refills | Status: DC | PRN

## 2024-09-25 MED ORDER — MEPERIDINE HCL 25 MG/ML IJ SOLN: 6.2500 mg | INTRAMUSCULAR | Status: DC | PRN

## 2024-09-25 MED ORDER — INSULIN ASPART 100 UNIT/ML IJ SOLN: 0.0000 [IU] | INTRAMUSCULAR | Status: DC | PRN

## 2024-09-25 MED ORDER — CHLORHEXIDINE GLUCONATE CLOTH 2 % EX PADS
6.0000 | MEDICATED_PAD | Freq: Once | CUTANEOUS | Status: AC
  Administered 2024-09-25: 6 via TOPICAL

## 2024-09-25 MED ORDER — ONDANSETRON HCL 4 MG/2ML IJ SOLN
4.0000 mg | Freq: Once | INTRAMUSCULAR | Status: DC | PRN
Start: 2024-09-25 — End: 2024-09-25

## 2024-09-25 MED ORDER — FENTANYL CITRATE (PF) 100 MCG/2ML IJ SOLN: 25.0000 ug | INTRAMUSCULAR | Status: DC | PRN

## 2024-09-25 MED ORDER — ONDANSETRON HCL 4 MG/2ML IJ SOLN
INTRAMUSCULAR | Status: AC
  Filled 2024-09-25: qty 2

## 2024-09-25 MED ORDER — BUPIVACAINE HCL (PF) 0.5 % IJ SOLN
INTRAMUSCULAR | Status: AC
Start: 2024-09-25 — End: 2024-09-25
  Filled 2024-09-25: qty 30

## 2024-09-25 MED ORDER — CEPHALEXIN 500 MG PO CAPS: 500.0000 mg | ORAL_CAPSULE | Freq: Three times a day (TID) | ORAL | 0 refills | Status: DC

## 2024-09-25 MED ORDER — BUPIVACAINE HCL (PF) 0.5 % IJ SOLN
INTRAMUSCULAR | Status: DC | PRN
  Administered 2024-09-25: 10 mL

## 2024-09-25 MED ORDER — PROPOFOL 500 MG/50ML IV EMUL
INTRAVENOUS | Status: DC | PRN
  Administered 2024-09-25: 125 ug/kg/min via INTRAVENOUS

## 2024-09-25 MED ORDER — OXYCODONE HCL 5 MG/5ML PO SOLN: 5.0000 mg | Freq: Once | ORAL | Status: DC | PRN

## 2024-09-25 MED ORDER — 0.9 % SODIUM CHLORIDE (POUR BTL) OPTIME
TOPICAL | Status: DC | PRN
  Administered 2024-09-25: 1000 mL

## 2024-09-25 MED ORDER — CEFAZOLIN SODIUM-DEXTROSE 2-4 GM/100ML-% IV SOLN
2.0000 g | INTRAVENOUS | Status: AC
  Administered 2024-09-25: 2 g via INTRAVENOUS
  Filled 2024-09-25: qty 100

## 2024-09-25 MED ORDER — ORAL CARE MOUTH RINSE: 15.0000 mL | Freq: Once | OROMUCOSAL | Status: AC

## 2024-09-25 MED ORDER — OXYCODONE HCL 5 MG PO TABS: 5.0000 mg | ORAL_TABLET | Freq: Once | ORAL | Status: DC | PRN

## 2024-09-25 MED ORDER — DEXAMETHASONE SOD PHOSPHATE PF 10 MG/ML IJ SOLN
INTRAMUSCULAR | Status: DC | PRN
  Administered 2024-09-25: 5 mg via INTRAVENOUS

## 2024-09-25 MED ORDER — ONDANSETRON HCL 4 MG/2ML IJ SOLN
INTRAMUSCULAR | Status: DC | PRN
  Administered 2024-09-25: 4 mg via INTRAVENOUS

## 2024-09-25 MED ORDER — HYDROCODONE-ACETAMINOPHEN 5-325 MG PO TABS: 1.0000 | ORAL_TABLET | Freq: Four times a day (QID) | ORAL | 0 refills | Status: DC | PRN

## 2024-09-25 MED ORDER — LACTATED RINGERS IV SOLN: INTRAVENOUS | Status: DC

## 2024-09-25 MED ORDER — CHLORHEXIDINE GLUCONATE 0.12 % MT SOLN
15.0000 mL | Freq: Once | OROMUCOSAL | Status: AC
  Administered 2024-09-25: 15 mL via OROMUCOSAL
  Filled 2024-09-25: qty 15

## 2024-09-25 SURGICAL SUPPLY — 54 items
BENZOIN TINCTURE PRP APPL 2/3 (GAUZE/BANDAGES/DRESSINGS) IMPLANT
BIT DRILL AT3 MICRO FLUTE V (BIT) IMPLANT
BLADE AVERAGE 25X9 (BLADE) IMPLANT
BLADE OSC/SAG .038X5.5 CUT EDG (BLADE) IMPLANT
BLADE OSCILLATING SAW SHORT (MISCELLANEOUS) IMPLANT
BLADE SURG 15 STRL LF DISP TIS (BLADE) ×2 IMPLANT
BLADE SW THK.38XMED LNG THN (BLADE) ×1 IMPLANT
BLADE SW THK.38XMED NAR THN (BLADE) IMPLANT
BNDG COMPR ESMARK 4X3 LF (GAUZE/BANDAGES/DRESSINGS) ×1 IMPLANT
BNDG ELASTIC 4INX 5YD STR LF (GAUZE/BANDAGES/DRESSINGS) ×2 IMPLANT
BNDG GAUZE DERMACEA FLUFF 4 (GAUZE/BANDAGES/DRESSINGS) ×1 IMPLANT
CHLORAPREP W/TINT 26 (MISCELLANEOUS) ×1 IMPLANT
COVER BACK TABLE 60X90IN (DRAPES) ×1 IMPLANT
CUFF TOURN SGL QUICK 12 (TOURNIQUET CUFF) IMPLANT
CUFF TOURN SGL QUICK 18X4 (TOURNIQUET CUFF) IMPLANT
DRAPE EXTREMITY T 121X128X90 (DISPOSABLE) ×1 IMPLANT
DRAPE IMP U-DRAPE 54X76 (DRAPES) ×1 IMPLANT
DRAPE OEC MINIVIEW 54X84 (DRAPES) ×1 IMPLANT
DRAPE U-SHAPE 47X51 STRL (DRAPES) ×1 IMPLANT
DRSG XEROFORM 1X8 (GAUZE/BANDAGES/DRESSINGS) IMPLANT
ELECTRODE REM PT RTRN 9FT ADLT (ELECTROSURGICAL) ×1 IMPLANT
GAUZE SPONGE 4X4 12PLY STRL (GAUZE/BANDAGES/DRESSINGS) ×1 IMPLANT
GAUZE XEROFORM 1X8 LF (GAUZE/BANDAGES/DRESSINGS) ×1 IMPLANT
GLOVE BIO SURGEON STRL SZ7.5 (GLOVE) ×1 IMPLANT
GLOVE BIOGEL PI IND STRL 7.5 (GLOVE) ×1 IMPLANT
GOWN STRL REUS W/ TWL XL LVL3 (GOWN DISPOSABLE) ×2 IMPLANT
GUIDEWIRE TROCAR 0.9X150 (WIRE) IMPLANT
KIT TURNOVER KIT B (KITS) ×1 IMPLANT
KWIRE FX6X.062X2 END TROC (WIRE) IMPLANT
MANIFOLD NEPTUNE II (INSTRUMENTS) ×1 IMPLANT
NDL FILTER BLUNT 18X1 1/2 (NEEDLE) ×1 IMPLANT
NDL HYPO 25X1 1.5 SAFETY (NEEDLE) ×3 IMPLANT
NEEDLE FILTER BLUNT 18X1 1/2 (NEEDLE) ×1 IMPLANT
NEEDLE HYPO 25X1 1.5 SAFETY (NEEDLE) ×3 IMPLANT
NS IRRIG 500ML POUR BTL (IV SOLUTION) ×1 IMPLANT
PACK BASIN DAY SURGERY FS (CUSTOM PROCEDURE TRAY) ×1 IMPLANT
PAD CAST 4YDX4 CTTN HI CHSV (CAST SUPPLIES) ×1 IMPLANT
PAD PREP 24X48 CUFFED NSTRL (MISCELLANEOUS) ×1 IMPLANT
PENCIL SMOKE EVACUATOR (MISCELLANEOUS) ×1 IMPLANT
RASP SM TEAR CROSS CUT (RASP) IMPLANT
SCREW AT3 HDLS COMPR 3.25 NS (Screw) IMPLANT
SLEEVE SCD COMPRESS KNEE MED (STOCKING) ×1 IMPLANT
STOCKINETTE 4X48 STRL (DRAPES) ×1 IMPLANT
STRIP CLOSURE SKIN 1/4X4 (GAUZE/BANDAGES/DRESSINGS) IMPLANT
SUCTION TUBE FRAZIER 10FR DISP (SUCTIONS) ×1 IMPLANT
SUT ETHILON 5 0 PS 2 18 (SUTURE) IMPLANT
SUT MNCRL AB 3-0 PS2 27 (SUTURE) ×1 IMPLANT
SUT MNCRL AB 4-0 PS2 18 (SUTURE) ×1 IMPLANT
SUT MON AB 5-0 PS2 18 (SUTURE) ×1 IMPLANT
SUT PROLENE 4 0 PS 2 18 (SUTURE) IMPLANT
SYR 10ML LL (SYRINGE) ×2 IMPLANT
SYR BULB EAR ULCER 3OZ GRN STR (SYRINGE) ×1 IMPLANT
TUBE CONNECTING 12X1/4 (SUCTIONS) ×1 IMPLANT
WATER STERILE IRR 500ML POUR (IV SOLUTION) ×1 IMPLANT

## 2024-09-25 NOTE — Op Note (Signed)
 09/25/2024  9:36 AM  PATIENT:  Hailey Wright  78 y.o. female  PRE-OPERATIVE DIAGNOSIS:  Hammer toe right foot, 2nd toe  POST-OPERATIVE DIAGNOSIS:  Hammer toe right foot, 2nd toe  PROCEDURE:  Procedure(s) with comments: CORRECTION, HAMMER TOE (Right) - 2nd toe  SURGEON:  Surgeons and Role:    * Gershon Donnice SAUNDERS, DPM - Primary  PHYSICIAN ASSISTANT:   ASSISTANTS: none   ANESTHESIA:   general  EBL:  minimal   BLOOD ADMINISTERED:none  DRAINS: none   LOCAL MEDICATIONS USED:  MARCAINE    , BUPIVICAINE , and Amount: 10 ml  SPECIMEN:  No Specimen  DISPOSITION OF SPECIMEN:  N/A  COUNTS:  YES  TOURNIQUET:   Total Tourniquet Time Documented: Ankle (Right) - 28 minutes Total: Ankle (Right) - 28 minutes   DICTATION: .Nechama Dictation  PLAN OF CARE: Discharge to home after PACU  PATIENT DISPOSITION:  PACU - hemodynamically stable.   Delay start of Pharmacological VTE agent (>24hrs) due to surgical blood loss or risk of bleeding: no  Indications for surgery: 78 year old female presented to the with concerns of ongoing hammertoe to the right second toe which was causing pain.  She had attempted numerous conservative treatments any significant improvement.  We discussed surgical intervention and she wishes to proceed.  All alternatives, risks, complications were discussed.  No promises or guarantees given as directed with the procedure and all questions were answered to the best my ability.  Procedure in detail: The patient was both verbally and visually identified by myself, nursing staff, the anesthesia staff preoperatively.  She was then transferred to the operating room via stretcher and placed on the operating table in supine position.  After adequate plane of anesthesia was obtained a timeout was performed and a mixture of 10 mL of lidocaine , Marcaine  plain was infiltrated in a regional block fashion.  A well-padded pneumatic ankle tourniquet was applied making sure to pad all  bony prominences.  Secondary timeout is performed.  The right lower extremity was exsanguinated with an Esmarch bandage and pneumatic ankle tourniquet is inflated to 250 mmHg.  At this time to semielliptical incisions were made plantarly on the PIPJ.  The incision was made with a 15 by scalpel to the epidermis the dermis removing a wedge of tissue.  The extensor tendon was then transected as well as the collateral ligaments.  The head of the proximal phalanx as well as the base of the middle phalanx was resected with a sagittal saw.  A guidewire for an Accumed Acutrack 2.5 millimeter screw.  This was confirmed under fluoroscopy.  A stab incision was made distally.  The screw was inserted to adequate compression under fluoroscopy.  Guidewires removed in total.  There was sitting in rectus.  Still somewhat dorsiflexed at the level of the MPJ.  Extensor tendon was found to be contracted and this was transected.  The toe was sitting more rectus.  I did not want to get it completely flush given her bunion as I would likely cause rubbing.  Toe is sitting in a much better position compared to what it was preoperatively.  I copiously irrigated the incision with saline hemostasis achieved.  Incision was closed with the extensor tendon with Monocryl and skin was then closed with nylon.  Xeroform was applied followed by dressing.  Tourniquet was released and was found to be an immediate cap refill time to all digits.  She was then transferred to PACU vital signs and vascular status intact.  Postoperative course:  Patient weight-bear as tolerated in surgical shoe although limited.  Discharge home once stable.

## 2024-09-25 NOTE — Anesthesia Postprocedure Evaluation (Signed)
 Anesthesia Post Note  Patient: Hailey Wright  Procedure(s) Performed: CORRECTION, HAMMER TOE (Right: Toe)     Patient location during evaluation: PACU Anesthesia Type: MAC Level of consciousness: awake and alert Pain management: pain level controlled Vital Signs Assessment: post-procedure vital signs reviewed and stable Respiratory status: spontaneous breathing, nonlabored ventilation, respiratory function stable and patient connected to nasal cannula oxygen  Cardiovascular status: stable and blood pressure returned to baseline Postop Assessment: no apparent nausea or vomiting Anesthetic complications: no   No notable events documented.  Last Vitals:  Vitals:   09/25/24 1000 09/25/24 1004  BP: (!) 99/58   Pulse: (!) 59 (!) 58  Resp: 17 17  Temp:  36.4 C  SpO2: 91% 92%    Last Pain:  Vitals:   09/25/24 1004  TempSrc:   PainSc: 0-No pain                 Daisa Stennis

## 2024-09-25 NOTE — Discharge Instructions (Signed)
 See written instructions Resume home medications as before.  Pain medication as needed. Do not take any additional tylenol  while on the stronger pain medication (Vicodin). Once you are not taking this, you can restart the tylenol .

## 2024-09-25 NOTE — Progress Notes (Signed)
 Orthopedic Tech Progress Note Patient Details:  Hailey Wright [birth year] [id number]  Ortho Devices Type of Ortho Device: Crutches, CAM walker Ortho Device/Splint Location: RLE Ortho Device/Splint Interventions: Ordered, Application, AdjustmentPACU RN called requesting a CAM WALKER BOOT.     Post Interventions Patient Tolerated: Well, Ambulated well Instructions Provided: Care of device  Delanna LITTIE Pac 09/25/2024, 10:27 AM

## 2024-09-25 NOTE — Transfer of Care (Signed)
 Immediate Anesthesia Transfer of Care Note  Patient: Hailey Wright  Procedure(s) Performed: CORRECTION, HAMMER TOE (Right: Toe)  Patient Location: PACU  Anesthesia Type:MAC  Level of Consciousness: drowsy  Airway & Oxygen  Therapy: Patient Spontanous Breathing  Post-op Assessment: Report given to RN and Post -op Vital signs reviewed and stable  Post vital signs: Reviewed and stable  Last Vitals:  Vitals Value Taken Time  BP 94/43 09/25/24 09:36  Temp    Pulse 56 09/25/24 09:39  Resp 19 09/25/24 09:39  SpO2 95 % 09/25/24 09:39  Vitals shown include unfiled device data.  Last Pain:  Vitals:   09/25/24 0734  TempSrc:   PainSc: 0-No pain         Complications: No notable events documented.

## 2024-09-25 NOTE — H&P (Signed)
 Patient seen and evaluated in pre-op. She presents today for RIGHT 2nd toe hammertoe repair due to pain. We again discussed the surgery and post-operative course. All alteratives, risks, complications dicussed. No further questions or concerns. Will proceed as scheduled. NPO confirmed.   Her husband was present and no further questions.

## 2024-09-26 ENCOUNTER — Encounter: Admitting: Podiatry

## 2024-09-26 ENCOUNTER — Encounter (HOSPITAL_COMMUNITY): Payer: Self-pay | Admitting: Podiatry

## 2024-09-30 ENCOUNTER — Ambulatory Visit (INDEPENDENT_AMBULATORY_CARE_PROVIDER_SITE_OTHER): Admitting: Podiatry

## 2024-09-30 ENCOUNTER — Ambulatory Visit (INDEPENDENT_AMBULATORY_CARE_PROVIDER_SITE_OTHER)

## 2024-09-30 VITALS — BP 105/59 | HR 66

## 2024-09-30 DIAGNOSIS — M2041 Other hammer toe(s) (acquired), right foot: Secondary | ICD-10-CM | POA: Diagnosis not present

## 2024-09-30 MED ORDER — HYDROCODONE-ACETAMINOPHEN 5-325 MG PO TABS
1.0000 | ORAL_TABLET | Freq: Four times a day (QID) | ORAL | 0 refills | Status: AC | PRN
Start: 2024-09-30 — End: ?

## 2024-09-30 NOTE — Progress Notes (Signed)
 Patient presents for post-op visit today, POV # 1 DOS 09/25/2024 RT 2ND HAMMERTOE REPAIR W/ SCREW OR WIRE FIXATION  I have been doing really fine. I have used all my pain medication. I have been sleeping with it totally elevated, and spending most of my time in the recliner. Tried to increase activity over the past couple of days. All in al pretty well..  RN Notes:   Vital Signs: Today's Vitals   09/30/24 0907 09/30/24 0912  BP: (!) 100/58 (!) 105/59  Pulse: 61 66  PainSc: 0-No pain       Radiographs: [x]  Taken []  Not taken  Surgical Site Assessment:  - Dressing:  [x]  Minimal dry blood, intact []  Reinforced   []  Changed     -RN Notes: n/a  - Incision:  [x]  CDI (clean, dry, intact)  [x]  Mild erythema  []  Drainage noted   -RN Notes: n/a  - Swelling:  []  None  [x]  Mild  []  Moderate   []  Significant     -RN Notes: n/a  - Bruising:  [x]  None  []  Present: n/a   - Sutures/Staples:  []  None [x]  Intact  []  Removed Today  [x]  Plan to remove at next visit   -Cast/Splint/Pins: [x]  None []  Intact []  Removed Today []  Plan to remove at next visit []  Replaced  -Signs of infection:  [x]  None  []  Present - Describe: n/a  -DME:    []  None [x]  AFW []  Surgical shoe []  Cast  []  Splint  -Walking status:  [x]  Full WB  []  Partial WB  []  NWB  -Utilizing device:  [x]  None []  Knee Scooter []  Crutches []  Wheelchair    DVT assessment:  [x]  Denies symptoms []  Chest pain/SOB []  Pain in calf/redness/warmth   Redressed DSD and ace wrap. Educated on signs of infection, proper dressing care, pain management, and weight bearing status. Patient will contact provider with any new or worsening symptoms. The provider assessed the patient today and reviewed instructions regarding plan of care.  -  Patient seen and evaluated by myself.  Incisions healing well.  There is some mild erythema to the toe but there is no ascending/there is no increase in temperature.  There is no drainage or pus or any signs of  infection otherwise.  There is no malodor.  Dressing applied today.  Remain in surgical boot, elevation.  Refill pain medication take if needed but she states that she has decreased the amount. Monitor for any clinical signs or symptoms of infection and directed to call the office immediately should any occur or go to the ER.  Radiology: Hardware intact status post hammertoe repair second digit without any complicating factors.  Return in about 2 weeks (around 10/14/2024) for F/U already made, please confirm with patient.SABRA Donnice JONELLE Gershon DPM

## 2024-09-30 NOTE — Patient Instructions (Signed)

## 2024-10-03 ENCOUNTER — Encounter: Admitting: Podiatry

## 2024-10-14 ENCOUNTER — Encounter

## 2024-10-15 ENCOUNTER — Ambulatory Visit: Admitting: Podiatry

## 2024-10-15 ENCOUNTER — Encounter: Admitting: Podiatry

## 2024-10-15 DIAGNOSIS — M2041 Other hammer toe(s) (acquired), right foot: Secondary | ICD-10-CM | POA: Diagnosis not present

## 2024-10-15 NOTE — Progress Notes (Unsigned)
 Patient presents for post-op visit today, POV # 2 DOS 09/25/2024 RT 2ND HAMMERTOE REPAIR W/ SCREW OR WIRE FIXATION  Doing great. I did not end up needing extra pain medication. Tylenol  actually helped. I was able to go to the [employer]. I did get a little tired after awhile, but sat down for a bit..  RN Notes: n/a  Vital Signs: Today's Vitals   10/15/24 0806  PainSc: 0-No pain      Radiographs: []  Taken [x]  Not taken  Surgical Site Assessment:  - Dressing:  [x]  Minimal dry blood, intact []  Reinforced   []  Changed     -RN Notes: n/a  - Incision:  [x]  CDI (clean, dry, intact)  [x]  Mild erythema  []  Drainage noted   -RN Notes: n/a  - Swelling:  []  None  [x]  Mild  []  Moderate   []  Significant     -RN Notes: n/a  - Bruising:  [x]  None  []  Present: n/a   - Sutures/Staples:  []  None [x]  Intact  [x]  Removed Today  []  Plan to remove at next visit   -Cast/Splint/Pins: [x]  None []  Intact []  Removed Today []  Plan to remove at next visit []  Replaced  -Signs of infection:  [x]  None  []  Present - Describe: n/a  -DME:    []  None [x]  AFW []  Surgical shoe []  Cast  []  Splint  -Walking status:  [x]  Full WB  []  Partial WB  []  NWB  -Utilizing device:  [x]  None []  Knee Scooter []  Crutches []  Wheelchair    DVT assessment:  [x]  Denies symptoms []  Chest pain/SOB []  Pain in calf/redness/warmth   Redressed DSD and ace wrap. Educated on signs of infection, proper dressing care, pain management, and weight bearing status. Patient will contact provider with any new or worsening symptoms. The provider assessed the patient today and reviewed instructions regarding plan of care.  - Patient seen and evaluated by myself as well. Sutures removed without issue. Incision is healing well. There is still swelling to the toe with mild erythema, but I think this is more from inflammation as opposed to infection. There is no increased temperature noted, and no drainage/pus.   I showed her how to rewrap  the toes to help control the swelling. She was placed into a surgical shoe as well. Continue to ice/elevate.   No follow-ups on file.  Donnice JONELLE Fees DPM

## 2024-10-16 ENCOUNTER — Encounter: Payer: Self-pay | Admitting: Podiatry

## 2024-10-21 ENCOUNTER — Ambulatory Visit: Attending: Cardiology

## 2024-10-21 DIAGNOSIS — Z1231 Encounter for screening mammogram for malignant neoplasm of breast: Secondary | ICD-10-CM | POA: Diagnosis not present

## 2024-10-21 DIAGNOSIS — I5022 Chronic systolic (congestive) heart failure: Secondary | ICD-10-CM

## 2024-10-21 DIAGNOSIS — Z95810 Presence of automatic (implantable) cardiac defibrillator: Secondary | ICD-10-CM

## 2024-10-21 NOTE — Progress Notes (Signed)
 EPIC Encounter for ICM Monitoring  Patient Name: Hailey Wright is a 78 y.o. female Date: 10/21/2024 Primary Care Physican: Yolande Toribio MATSU, MD Primary Cardiologist: Hochrein Electrophysiologist: Inocencio 11/24/2023 Weight:  148 lbs 12/28/2023 Weight: 145 lbs 01/30/2024 Weight: 146 lbs 02/06/2024 Weight: 143 lbs 03/19/2024 Weight: 147 lbs 06/05/2024 Weight: 159 lbs 09/18/2024 Weight: 148 lbs   Spoke with patient and heart failure questions reviewed.  Transmission results reviewed.  Pt asymptomatic for fluid accumulation.  Reports feeling well at this time and voices no complaints.  She developed side effect of hoarseness from taking Repatha  which has been discontinued but is improving.   Since 09/16/2024 ICM Remote Transmission:  [facility] thoracic impedance suggesting normal fluid levels with the exception of possible fluid accumulation from 09/25/2024-09/30/2024 and 10/02/2024-10/09/2024.   Prescribed:  Furosemide  40 mg 0.5 tablet (20 mg total) by mouth daily as needed.   Potassium 20 mEq take 1 tablet as needed when taking Lasix    Labs: 09/18/2023 Creatinine 0.86, BUN 18, Potassium 4.8, Sodium 144, GFR 70 05/04/2023 Creatinine 0.90, BUN 15, Potassium 3.7, Sodium 140, GFR >60 A complete set of results can be found in Results Review.   Recommendations:  No changes and encouraged to call if experiencing any fluid symptoms.   Follow-up plan: ICM clinic phone appointment on 12/02/2024.   91 day device clinic remote transmission 12/12/2024.     EP/Cardiology Office Visit:   Recall 02/02/2025 with Dr. Lavona.  01/15/2025 with Dr Inocencio.   Copy of ICM check sent to Dr. Inocencio.   Remote monitoring is medically necessary for Heart Failure Management.    Daily Thoracic Impedance ICM trend: 07/23/2024 through 10/21/2024.    12-14 Month Thoracic Impedance ICM trend:     Hailey GORMAN Garner, RN 10/21/2024 3:47 PM

## 2024-10-28 ENCOUNTER — Ambulatory Visit (INDEPENDENT_AMBULATORY_CARE_PROVIDER_SITE_OTHER): Admitting: Podiatry

## 2024-10-28 ENCOUNTER — Ambulatory Visit (INDEPENDENT_AMBULATORY_CARE_PROVIDER_SITE_OTHER)

## 2024-10-28 DIAGNOSIS — M2041 Other hammer toe(s) (acquired), right foot: Secondary | ICD-10-CM

## 2024-10-28 NOTE — Progress Notes (Signed)
 Subjective: Chief Complaint  Patient presents with   Routine Post Op    POV # 3 DOS 09/18/2024 RT 2ND HAMMERTOE REPAIR W/ SCREW OR WIRE FIXATION Pt stated that she is still having some swelling but no major pain    78 year old female presents the office with above concerns.  States that she still has some swelling but no major discomfort she is not taking any pain medication for the toe.  She presents wearing a surgical shoe.  She has tried her regular shoe for short distances.  No recent injuries or changes.  She still wraps the toe.  No fevers or chills.  No other concerns.   Objective: AAO x3, NAD DP/PT pulses palpable bilaterally, CRT less than 3 seconds Status post second digit hammertoe repair.  There is also mild edema present of the toe but appears to be improved compared to what it was last appointment.  Is no erythema or warmth today.  There is no ascending cellulitis.  No significant pain on exam. No pain with calf compression, swelling, warmth, erythema  Assessment: Status post hammertoe repair  Plan: -All treatment options discussed with the patient including all alternatives, risks, complications.  -X-rays obtained reviewed.  Hardware intact but any complicating factors. -I would recommend continue wrapping the toe to help with the swelling.  Continue surgical shoe for now but then she can start to gradually transition to a supportive sneaker as tolerated.  Continue ice, elevate. -Patient encouraged to call the office with any questions, concerns, change in symptoms.   Return in about 4 weeks (around 11/25/2024) for post-op, x-ray.  Hailey Wright DPM

## 2024-11-25 ENCOUNTER — Ambulatory Visit (INDEPENDENT_AMBULATORY_CARE_PROVIDER_SITE_OTHER): Admitting: Podiatry

## 2024-11-25 ENCOUNTER — Ambulatory Visit (INDEPENDENT_AMBULATORY_CARE_PROVIDER_SITE_OTHER)

## 2024-11-25 DIAGNOSIS — M2041 Other hammer toe(s) (acquired), right foot: Secondary | ICD-10-CM

## 2024-11-27 NOTE — Progress Notes (Signed)
 Subjective: No chief complaint on file.   79 year old female presents the office today for up evaluation status post right second toe hammertoe repair.  States that she has been doing well and she is back to her regular shoe.  No significant pain at this time.  She will occasionally wrap the toe to help with swelling and help hold the toe down at times but on a regular basis.  She does not report any recent injuries or changes.     Objective: AAO x3, NAD DP/PT pulses palpable bilaterally, CRT less than 3 seconds Status post second digit hammertoe repair.  This is well-healed.  Still some edema present still but overall improving.  No erythema or warmth.  It is rectus at the PIPJ but has a slight mallet toe deformity.  There is no pain to the toe.  There is no erythema, warmth or otherwise there is no signs of infection.  But no deformities present.  No pain with calf compression, swelling, warmth, erythema  Assessment: Status post hammertoe repair  Plan: -All treatment options discussed with the patient including all alternatives, risks, complications.  -X-rays obtained reviewed.  Hardware intact but any complicating factors postoperatively.  No evidence of acute fracture. -Overall she is doing well.  I discussed with her this still will wrap the toe if needed also buddy splinting to the third toe if she were to wear more narrow toebox type shoe.  Unfortunately with the bunion deformity is difficult to get the toe fully down but is much better compared to what it was prior to surgery.  As time she is happy the outcome of the surgery and is significant pain.  At this time the discharge from the postoperative course and she agrees this plan but there is any questions or concerns or any changes to let us  know.  Return if symptoms worsen or fail to improve.  Hailey Wright Fees DPM

## 2024-12-02 ENCOUNTER — Ambulatory Visit: Attending: Cardiology

## 2024-12-02 ENCOUNTER — Telehealth: Payer: Self-pay

## 2024-12-02 DIAGNOSIS — I5022 Chronic systolic (congestive) heart failure: Secondary | ICD-10-CM

## 2024-12-02 DIAGNOSIS — Z95810 Presence of automatic (implantable) cardiac defibrillator: Secondary | ICD-10-CM | POA: Diagnosis not present

## 2024-12-02 NOTE — Telephone Encounter (Signed)
 Remote ICM transmission received.  Attempted call to patient regarding ICM remote transmission.  Left detailed message per DPR with ICM phone number to return call for any questions, concerns or fluid symptoms.

## 2024-12-02 NOTE — Progress Notes (Signed)
 EPIC Encounter for ICM Monitoring  Patient Name: Hailey Wright is a 79 y.o. female Date: 12/02/2024 Primary Care Physican: Yolande Toribio MATSU, MD Primary Cardiologist: Hochrein Electrophysiologist: Inocencio 11/24/2023 Weight:  148 lbs 12/28/2023 Weight: 145 lbs 01/30/2024 Weight: 146 lbs 02/06/2024 Weight: 143 lbs 03/19/2024 Weight: 147 lbs 06/05/2024 Weight: 159 lbs 09/18/2024 Weight: 148 lbs   Attempted call to patient and unable to reach.  Left detailed message per DPR regarding transmission.  Transmission results reviewed.    Since 09/16/2024 ICM Remote Transmission:  [facility] thoracic impedance suggesting normal fluid levels with the exception of possible fluid accumulation starting 11/29/2024 and trending close to baseline.  Also suggest possible fluid accumulation from 11/06/2024-11/16/2024.   Prescribed:  Furosemide  40 mg 0.5 tablet (20 mg total) by mouth daily as needed.   Potassium 20 mEq take 1 tablet as needed when taking Lasix    Labs: 09/18/2023 Creatinine 0.86, BUN 18, Potassium 4.8, Sodium 144, GFR 70 05/04/2023 Creatinine 0.90, BUN 15, Potassium 3.7, Sodium 140, GFR >60 A complete set of results can be found in Results Review.   Recommendations:  Left voice mail with ICM number and encouraged to call if experiencing any fluid symptoms.   Follow-up plan: ICM clinic phone appointment on 01/02/2025.   91 day device clinic remote transmission 12/12/2024.     EP/Cardiology Office Visit:   Recall 02/02/2025 with Dr. Lavona.  01/15/2025 with Dr Inocencio.   Copy of ICM check sent to Dr. Inocencio.   Remote monitoring is medically necessary for Heart Failure Management.    Daily Thoracic Impedance ICM trend: 09/03/2024 through 12/02/2024.    12-14 Month Thoracic Impedance ICM trend:     Hailey GORMAN Garner, RN 12/02/2024 12:24 PM

## 2024-12-03 ENCOUNTER — Ambulatory Visit (INDEPENDENT_AMBULATORY_CARE_PROVIDER_SITE_OTHER)

## 2024-12-03 ENCOUNTER — Encounter (INDEPENDENT_AMBULATORY_CARE_PROVIDER_SITE_OTHER): Payer: Self-pay

## 2024-12-03 VITALS — BP 84/45 | HR 83 | Temp 98.0°F | Wt 147.0 lb

## 2024-12-03 DIAGNOSIS — J342 Deviated nasal septum: Secondary | ICD-10-CM

## 2024-12-03 DIAGNOSIS — J3489 Other specified disorders of nose and nasal sinuses: Secondary | ICD-10-CM

## 2024-12-03 DIAGNOSIS — J383 Other diseases of vocal cords: Secondary | ICD-10-CM

## 2024-12-03 DIAGNOSIS — R490 Dysphonia: Secondary | ICD-10-CM | POA: Diagnosis not present

## 2024-12-03 DIAGNOSIS — J3801 Paralysis of vocal cords and larynx, unilateral: Secondary | ICD-10-CM | POA: Diagnosis not present

## 2024-12-03 NOTE — Progress Notes (Signed)
 Dear Dr. Yolande, Here is my assessment for our mutual patient, Hailey Wright. Thank you for allowing me the opportunity to care for your patient. Please do not hesitate to contact me should you have any other questions. Sincerely, Dr. Penne Croak  Otolaryngology Clinic Note Referring provider: Dr. Yolande HPI:  Discussed the use of AI scribe software for clinical note transcription with the patient, who gave verbal consent to proceed.  History of Present Illness Hailey Wright is a 79 year old female with vocal fold atrophy who presents with progressive hoarseness and dysphonia.  Hoarseness and Dysphonia: - Progressive hoarseness and dysphonia since June or July 2025 - Symptoms gradually worsening over time - By end of day, often only able to whisper - Difficulty projecting voice, occasionally leading to shortness of breath - Persistent globus sensation - Able to sing within a limited range; voice quivers at higher or lower pitches  Cough and Rhinorrhea: - Chronic, harsh cough, worsened following episode of bronchitis in December 2025 - Persistent rhinorrhea - Occasional post-nasal drip with upper respiratory infections  Medication-Related Symptom Onset: - Onset of hoarseness, cough, and rhinorrhea after initiation of Repatha  (evolocumab ) in May 2025 - Discontinued Repatha ; resumed Crestor  and Zetia   Speech Therapy History: - Diagnosed with minor vocal fold atrophy four years ago after evaluation for throat pain without dysphonia - Underwent speech therapy, including straw phonation exercises, but discontinued after four or five sessions due to travel inconvenience - Has not resumed speech therapy exercises regularly  Nasal and Surgical History: - Remote history of rhinoplasty in 1969 - No current nasal obstruction or drainage - No history of neck surgery  Tobacco Use: - 42 pack-year smoking history - Quit smoking 14 years ago   Independent Review of Additional  Tests or Records:  Reviewed external note from referring PCP, [place],describing relevant history incorporated into todays evaluation. [facility] Megan note from 2022- flexible laryngoscopy stated TVF mobility preserved.  PMH/Meds/All/SocHx/FamHx/ROS:   Past Medical History:  Diagnosis Date   Acute ST elevation myocardial infarction (STEMI) involving left anterior descending coronary artery (HCC) 02/18/2016   AICD (automatic cardioverter/defibrillator) present    CAD (coronary artery disease) 06/04/2013   CHF (congestive heart failure) (HCC)    Clotting disorder    COPD (chronic obstructive pulmonary disease) (HCC)    Coronary artery thrombosis (HCC) 02/18/2016   DVT (deep venous thrombosis) (HCC) 1980s   RLE   Hepatitis A infection 1987   Hyperlipidemia    Hypertension    Ischemic cardiomyopathy    a. Echo 5/17: EF 25-30%, anteroseptal, anterior, anteroseptal and apical akinesis, possible inferior hypokinesis, grade 1 diastolic dysfunction, no evidence of thrombus, trivial AI, mild MR, mild LAE   MI (myocardial infarction) (HCC) 1991   Inferior treated with TPA 1991, POBA RCA 1996   Pneumonia 1980s X 1   RAS (renal artery stenosis)    Right renal stent 2005   S/P Emergency CABG x 2 02/18/2016   LIMA to LAD, SVG to OM1, EVH via right thigh   Type II diabetes mellitus (HCC)    pt denies     Past Surgical History:  Procedure Laterality Date   ADJACENT TISSUE TRANSFER/TISSUE REARRANGEMENT Right 05/04/2023   Procedure: ADJACENT TISSUE TRANSFER/TISSUE REARRANGEMENT;  Surgeon: Rodgers Faden, MD;  Location: [facility] OR;  Service: Ophthalmology;  Laterality: Right;   CANTHOPLASTY Right 05/04/2023   Procedure: MEDIAL CANTHOPLASTY;  Surgeon: Rodgers Faden, MD;  Location: [facility] OR;  Service: Ophthalmology;  Laterality: Right;  CARDIAC CATHETERIZATION N/A 02/18/2016   Procedure: Left Heart Cath and Coronary Angiography;  Surgeon: Alm LELON Clay, MD;  Location: [facility]  INVASIVE CV LAB;  Service: Cardiovascular;  Laterality: N/A;   COLONOSCOPY WITH PROPOFOL  N/A 05/02/2022   Procedure: COLONOSCOPY WITH PROPOFOL ;  Surgeon: Leigh Elspeth SQUIBB, MD;  Location: [facility] ENDOSCOPY;  Service: Gastroenterology;  Laterality: N/A;   COMPLEX WOUND CLOSURE  05/04/2023   Procedure: INTERMEDIATE WOUND CLOSURE;  Surgeon: Rodgers Faden, MD;  Location: [facility] OR;  Service: Ophthalmology;;   CORONARY ANGIOPLASTY  1991   CORONARY ARTERY BYPASS GRAFT N/A 02/18/2016   Procedure: CORONARY ARTERY BYPASS GRAFTING (CABG) times 2 using left internal mammary and right greater saphenous vein;  Surgeon: Sudie VEAR Laine, MD;  Location: [facility] OR;  Service: Open Heart Surgery;  Laterality: N/A;   EP IMPLANTABLE DEVICE N/A 06/16/2016   Procedure: ICD Implant;  Surgeon: Will Gladis Norton, MD;  Location: [facility] INVASIVE CV LAB;  Service: Cardiovascular;  Laterality: N/A;   HAMMER TOE SURGERY Right 09/25/2024   Procedure: CORRECTION, HAMMER TOE;  Surgeon: Gershon Donnice SAUNDERS, DPM;  Location: [facility] OR;  Service: Orthopedics/Podiatry;  Laterality: Right;  2nd toe   LACRIMAL DUCT EXPLORATION Right 05/04/2023   Procedure: PLASTIC REPAIR OF CANALICULAR;  NASOLACRIMAL PROBE WITH TUBE OR STENT;  Surgeon: Rodgers Faden, MD;  Location: [facility] OR;  Service: Ophthalmology;  Laterality: Right;   LEFT HEART CATH AND CORS/GRAFTS ANGIOGRAPHY N/A 09/30/2021   Procedure: LEFT HEART CATH AND CORS/GRAFTS ANGIOGRAPHY;  Surgeon: BURNARD NED A;  Location: [facility] INVASIVE CV LAB;  Service: Cardiovascular;  Laterality: N/A;   LESION REMOVAL Right 05/04/2023   Procedure: MINOR EXCISION OF LESION 14440;  Surgeon: Rodgers Faden, MD;  Location: [facility] OR;  Service: Ophthalmology;  Laterality: Right;   LID LESION EXCISION Right 05/04/2023   Procedure: LID LESION EXCISION;  Surgeon: Rodgers Faden, MD;  Location: [facility] OR;  Service: Ophthalmology;  Laterality: Right;   POLYPECTOMY  05/02/2022   Procedure: POLYPECTOMY;  Surgeon: Leigh Elspeth SQUIBB, MD;   Location: [facility] ENDOSCOPY;  Service: Gastroenterology;;   RENAL ARTERY STENT Right 2005   RHINOPLASTY Bilateral 1968   SHOULDER SURGERY Right 2019   Dr Melita   TEAR DUCT PROBING Right 05/04/2023   Procedure: TEAR DUCT PROBING;  Surgeon: Rodgers Faden, MD;  Location: [facility] OR;  Service: Ophthalmology;  Laterality: Right;   TONSILLECTOMY AND ADENOIDECTOMY  1950s    Family History  Problem Relation Age of Onset   Cancer Mother        metastatic from small intestine   Heart attack Father 72       Fatal MI 49   Heart attack Brother 64   Colon cancer Neg Hx    Esophageal cancer Neg Hx    Rectal cancer Neg Hx    Stomach cancer Neg Hx    Pancreatic cancer Neg Hx      Social Connections: Not on file     Current Medications[1]   Physical Exam:   BP (!) 84/45 (BP Location: Right Arm, Patient Position: Sitting, Cuff Size: Normal)   Pulse 83   Temp 98 F (36.7 C)   Wt 147 lb (66.7 kg)   LMP  (LMP Unknown)   SpO2 96%   BMI 23.73 kg/m   The patient was awake, alert, and appropriate. The external ears were inspected, and otoscopy was performed to evaluate the external auditory canals and tympanic membranes. The nasal cavity and septum were examined for mucosal changes, obstruction, or discharge. The oral cavity  and oropharynx were inspected for mucosal lesions, infection, or tonsillar hypertrophy. The neck was palpated for lymphadenopathy, thyroid  abnormalities, or other masses. Cranial nerve function was grossly intact.  Pertinent Findings: General: Well developed, well nourished. No acute distress. Voice with hoarseness Head/Face: Normocephalic. No sinus tenderness. Facial nerve intact and equal bilaterally. No facial lacerations. Eyes: PERRL, no scleral icterus or conjunctival hemorrhage. EOMI. Ears: No gross deformity. Normal external canal. Tympanic membrane with normal landmarks bilaterally Hearing: Normal speech reception.  Mouth/Oropharynx: Lips without any lesions. Dentition  good. No mucosal lesions within the oropharynx. No tonsillar enlargement, exudate, or lesions. Pharyngeal walls symmetrical. Uvula midline. Tongue midline without lesions. Larynx: See TFL if applicable Nasopharynx: See TFL if applicable Neck: Trachea midline. No masses. No thyromegaly or nodules palpated. No crepitus. Lymphatic: No lymphadenopathy in the neck. Respiratory: No stridor or distress. Room air. Cardiovascular: Regular rate and rhythm. Extremities: No edema or cyanosis. Warm and well-perfused. Skin: No scars or lesions on face or neck. Neurologic: CN II-XII grossly intact. Moving all extremities without gross abnormality. Other:  Physical Exam HEENT: Nasal septum with a spur on the left side and deviation to the left. Nasal passages examined with no abnormalities noted. Nasal crusting observed. Right vocal cord paralyzed, left vocal cord moves normally.   Seprately Identifiable Procedures:  I personally ordered, reviewed and interpreted the following with the patient today  Procedure Note Pre-procedure diagnosis:  Dysphonia  Post-procedure diagnosis: Same Procedure: Transnasal Fiberoptic Laryngoscopy, CPT 31575 - Mod 25 Indication: history of vocal fold atrophy and worsened voice for the last 7 months. History of tobacco use Complications: None apparent EBL: 0 mL  The procedure was undertaken to further evaluate the patient's complaint of dypshonia, with mirror exam inadequate for appropriate examination due to gag reflex and poor patient tolerance  Procedure:  Patient was identified as correct patient. Verbal consent was obtained. The nose was sprayed with oxymetazoline  and 4% lidocaine . The The flexible laryngoscope was passed through the nose to view the nasal cavity, pharynx (oropharynx, hypopharynx) and larynx.  The larynx was examined at rest and during multiple phonatory tasks. Documentation was obtained and reviewed with patient. The scope was removed. The patient  tolerated the procedure well.  Findings: The nasal cavity and nasopharynx did not reveal any masses or lesions, mucosa appeared to be without obvious lesions. The tongue base, pharyngeal walls, piriform sinuses, vallecula, epiglottis and postcricoid region are normal in appearance EXCEPT: bowing of left tvf, paralyzed left tvf, glottic atrophy L>R. The visualized portion of the subglottis and proximal trachea is widely patent. There are no lesions on the free edge of the vocal folds nor elsewhere in the larynx worrisome for malignancy.    Electronically signed by: Penne Croak, DO 12/07/2024 6:54 AM  Given the patient's symptoms and incomplete visualization of critical sinonasal areas with anterior rhinoscopy, a separately performed diagnostic nasal endoscopy procedure is indicated for a complete rhinologic evaluation per American Rhinologic Society recommendations (https://[email or web address]-rhinologic.org/position-statements)  I personally ordered, reviewed and interpreted the following with the patient today  Procedure Note Diagnostic Nasal Endoscopy CPT CODE -- 68768 - Mod 25  Prior to initiating any procedures, risks/benefits/alternatives were explained to the patient and verbal consent obtained.  Pre-procedure diagnosis: Concern for mass Post-procedure diagnosis: same Indication: See pre-procedure diagnosis and physical exam above Complications: None apparent EBL: 0 mL Anesthesia: Lidocaine  4% and topical decongestant was topically sprayed in each nasal cavity  Description of Procedure:  Patient was identified. A flexible fiberoptic endoscope was utilized to evaluate  the sinonasal cavities, mucosa, sinus ostia and turbinates and septum.  Overall, signs of mucosal inflammation are noted.  Also noted are crusting.  No mucopurulence, polyps, or masses noted.   Right Middle meatus: clear Right SE Recess: clear Left MM: clear Left SE Recess: clear Photodocumentation was  obtained.   Impression & Plans:  Hailey Wright is a 79 y.o. female  1. Hoarseness   2. Paralysis of left vocal fold   3. Glottic insufficiency   4. DNS (deviated nasal septum)   5. Rhinorrhea    - Findings and diagnoses discussed in detail with the patient. - Risks, benefits, and alternatives were reviewed. Through shared decision making, the patient elects to proceed with below. Assessment & Plan Unilateral vocal fold paralysis with dysphonia Left vocal fold paralysis with bowing causing dysphonia and glottic insufficiency. Etiology unclear; differential includes viral neuropathy, age-related changes, and remote tobacco use. No mass or tumor on endoscopy; further imaging needed to exclude compressive lesions. Temporary improvement possible with vocal fold filler injection. - Ordered CT scan of neck and chest to evaluate for mass or compressive etiology along the recurrent laryngeal nerve. - Discussed in-office temporary vocal fold filler (Restylane) injection after her February trip, with preference for awake procedure due to cardiac device and anesthesia risk. - Provided education on Restylane injection technique, expected outcomes, post-procedure care (voice rest for 24 hours, numbness for 1-2 hours), and potential risks. - Provided reading material on Restylane filler. - Referred to speech therapy for voice rehabilitation. - Discussed insurance authorization process for procedure and therapy.  Vocal fold atrophy Ongoing vocal fold atrophy, previously managed with speech therapy, now compounded by unilateral vocal fold paralysis, contributing to glottic insufficiency and dysphonia. Vocal fold filler injection may address glottic insufficiency secondary to both atrophy and paralysis. - Referred to speech therapy for voice exercises and rehabilitation. - Discussed option for vocal fold filler injection to address glottic insufficiency secondary to atrophy and paralysis.  DNS - spur  left  - Orders placed:  Orders Placed This Encounter  Procedures   CT SOFT TISSUE NECK W CONTRAST   CT Chest W Contrast   Ambulatory referral to Speech Therapy   Ambulatory Referral For Surgery Scheduling   - Medications prescribed/continued/adjusted: No orders of the defined types were placed in this encounter.  - Education materials provided to the patient. - Follow up: after testing. Patient instructed to return sooner or go to the ED if new/worsening symptoms develop.   Thank you for allowing me the opportunity to care for your patient. Please do not hesitate to contact me should you have any other questions.  Sincerely, Penne Croak, DO Otolaryngologist (ENT) [facility] Phone: [phone] Fax: [phone]  12/07/2024, 6:54 AM        [1]  Current Outpatient Medications:    aspirin  81 MG tablet, Take 81 mg by mouth daily., Disp: , Rfl:    benzonatate (TESSALON) 200 MG capsule, Take 200 mg by mouth 3 (three) times daily as needed., Disp: , Rfl:    calcium -vitamin D  (OSCAL WITH D) 500-200 MG-UNIT per tablet, Take 1 tablet by mouth 2 (two) times daily., Disp: , Rfl:    carboxymethylcellulose (REFRESH PLUS) 0.5 % SOLN, Place 2 drops into both eyes at bedtime., Disp: , Rfl:    Co-Enzyme Q10 200 MG CAPS, Take 200 mg by mouth daily., Disp: , Rfl:    empagliflozin  (JARDIANCE ) 10 MG TABS tablet, Take 1 tablet (10 mg total) by mouth daily before breakfast. (Patient  taking differently: Take 12.5 mg by mouth daily before breakfast.), Disp: 90 tablet, Rfl: 3   ENTRESTO  24-26 MG, TAKE 1 TABLET TWICE A DAY, Disp: 180 tablet, Rfl: 3   ezetimibe  (ZETIA ) 10 MG tablet, Take 1 tablet (10 mg total) by mouth daily., Disp: 90 tablet, Rfl: 3   furosemide  (LASIX ) 40 MG tablet, Take 0.5 tablets (20 mg total) by mouth daily as needed for edema., Disp: 30 tablet, Rfl: 5   hydroquinone 4 % cream, Apply topically., Disp: , Rfl:    isosorbide  mononitrate (IMDUR ) 120 MG 24 hr  tablet, Take 1 tablet (120 mg total) by mouth daily., Disp: 90 tablet, Rfl: 1   Lifitegrast 5 % SOLN, Place 1 drop into both eyes daily. Xiidre, Disp: , Rfl:    Magnesium  Oxide 420 MG TABS, Take 420 mg by mouth daily., Disp: , Rfl:    Multiple Vitamin (MULTIVITAMIN) tablet, Take 1 tablet by mouth daily., Disp: , Rfl:    NITROSTAT  0.4 MG SL tablet, Place 1 tablet (0.4 mg total) under the tongue every 5 (five) minutes as needed for chest pain., Disp: 25 tablet, Rfl: 1   potassium chloride  SA (KLOR-CON  M20) 20 MEQ tablet, Take 1 tablet as needed when Lasix  is taken, Disp: 30 tablet, Rfl: 2   rosuvastatin  (CRESTOR ) 40 MG tablet, Take 1 tablet (40 mg total) by mouth daily., Disp: 90 tablet, Rfl: 1   tretinoin (RETIN-A) 0.025 % cream, Apply topically., Disp: , Rfl:    umeclidinium-vilanterol (ANORO ELLIPTA) 62.5-25 MCG/INH AEPB, Inhale 1 puff into the lungs at bedtime., Disp: , Rfl:    albuterol  (VENTOLIN  HFA) 108 (90 Base) MCG/ACT inhaler, Inhale 2 puffs into the lungs every 6 (six) hours as needed for wheezing or shortness of breath. (Patient not taking: Reported on 12/03/2024), Disp: , Rfl:    HYDROcodone -acetaminophen  (NORCO/VICODIN) 5-325 MG tablet, Take 1-2 tablets by mouth every 6 (six) hours as needed. (Patient not taking: Reported on 12/03/2024), Disp: 10 tablet, Rfl: 0

## 2024-12-03 NOTE — Patient Instructions (Signed)
 HYALURONIC ACID (RESTYLANE) INJECTION TO THE VOCAL CORD What is Hyaluronic Acid? Hyaluronic Acid is a synthetic substance. It contains a substance that is found in our subcutaneous tissue, or the soft tissue just beneath our skin. It has also been used in plastic surgery to fill in wrinkles or plump lips.    Hyaluronic Acid and the Vocal Cords Vocal cord problems are usually due to a lack of movement, bowing (loss of muscle) or weakness of the vocal cords. This lack of motion makes speech difficult. A lack of vocal cord motion can also cause:  Low, raspy or rough voice  Constant hoarseness  Breathing difficulty  Inability to speak above a whisper  Coughing or choking when swallowing Hyaluronic Acid increases the size or mass of the vocal cord where muscle loss has occurred. It acts like a space filler. Hyaluronic Acid is injected next to the vocal cords and into the muscles supporting the vocal cords. The body slowly reabsorbs it over time, and its effects usually last about 4-6 months, although this range varies for each patient.  Hyaluronic Acid Treatment You will be awake during the treatment and able to talk and breathe normally. Numbing medicines are used before the procedure to decrease the discomfort. The treatment will take about 5-10 minutes.    You should not eat a big meal before the injection.  In the office: You will sit up in a chair for the injection. A fiberoptic camera called a nasoendoscope is placed in a nostril and will go down to the base of your throat. This will allow the doctor to see your vocal cords. An injection of Hyaluronic Acid can be given in several different ways, including via your mouth, directly into the surface of the vocal cord, or through the skin near your Adams apple on your throat. If it is given through the skin, the lidocaine  is injected into the skin to numb it before the injection.  If it is given via the mouth, the lining of the throat is numbed  with liquid lidocaine  first. We often offer a dose of valium  to be taken before the procedure if you have someone who can drive you home afterward. Call the office if you would like this prescribed for you, then pick it up at your pharmacy and take it 1 hour before the procedure. Many patients choose not to take the valium , and they are able to drive themselves to and from the appointment.  In the operating room: You will be asleep under general anesthesia, with a breathing tube in. The procedure takes approximately 10 minutes and is done in a hospital or surgery center.  Risks include (but are not limited to) swelling, infection or allergic reaction, pain, extravasation into a more superficial layer of the vocal cord (making the voice more strained), inflammation, or failure to improve the voice. Rarely, additional procedures are needed to address these complications.  The voice is usually strained for a few days, then settles in by 4-7 days. Some patients may sound very good after the injection but find that their voice fades quickly, much earlier than the usual 4-6 months. These patients may benefit from a second injection right away, and they should call the office.  If you have any trouble breathing or severe pain with swallowing or talking, you should call the office right away. It is not unusual to have a sore throat for a few days afterward, but if this soreness or pain is severe, you should call  the office. You should always call the office for any questions or issues.  After a Hyaluronic Acid Treatment  If you had the procedure done in the office, you should not eat or drink for 1 hour, or until the sensation in your throat completely returns to normal  You may have some discomfort or pain that radiates from your throat to your ear.  You will follow up with your doctor as instructed.  The Hyaluronic Acid procedure results can last from 3-6 months, but this is different for every patient.

## 2024-12-06 ENCOUNTER — Encounter: Payer: Self-pay | Admitting: Cardiology

## 2024-12-09 ENCOUNTER — Telehealth: Payer: Self-pay

## 2024-12-09 NOTE — Telephone Encounter (Signed)
 Returned call to patient as requested by voice mail message.  Asked to have ICM remote transmission changed to after 01/06/2025 since she will be traveling from 12/23/2024-01/06/2025.

## 2024-12-12 ENCOUNTER — Ambulatory Visit: Payer: Self-pay | Admitting: Cardiology

## 2024-12-12 ENCOUNTER — Ambulatory Visit

## 2024-12-12 DIAGNOSIS — I255 Ischemic cardiomyopathy: Secondary | ICD-10-CM | POA: Diagnosis not present

## 2024-12-12 LAB — CUP PACEART REMOTE DEVICE CHECK
Battery Remaining Longevity: 13 mo
Battery Remaining Percentage: 14 %
Battery Voltage: 2.72 V
Brady Statistic AP VP Percent: 1 %
Brady Statistic AP VS Percent: 1 %
Brady Statistic AS VP Percent: 1 %
Brady Statistic AS VS Percent: 98 %
Brady Statistic RA Percent Paced: 1 %
Brady Statistic RV Percent Paced: 1 %
Date Time Interrogation Session: 20260122020015
HighPow Impedance: 75 Ohm
HighPow Impedance: 75 Ohm
Implantable Lead Connection Status: 753985
Implantable Lead Connection Status: 753985
Implantable Lead Implant Date: 20170727
Implantable Lead Implant Date: 20170727
Implantable Lead Location: 753859
Implantable Lead Location: 753860
Implantable Pulse Generator Implant Date: 20170727
Lead Channel Impedance Value: 390 Ohm
Lead Channel Impedance Value: 430 Ohm
Lead Channel Pacing Threshold Amplitude: 0.5 V
Lead Channel Pacing Threshold Amplitude: 1 V
Lead Channel Pacing Threshold Pulse Width: 0.5 ms
Lead Channel Pacing Threshold Pulse Width: 0.5 ms
Lead Channel Sensing Intrinsic Amplitude: 12 mV
Lead Channel Sensing Intrinsic Amplitude: 3.9 mV
Lead Channel Setting Pacing Amplitude: 2 V
Lead Channel Setting Pacing Amplitude: 2.5 V
Lead Channel Setting Pacing Pulse Width: 0.5 ms
Lead Channel Setting Sensing Sensitivity: 0.5 mV
Pulse Gen Serial Number: 7261095
Zone Setting Status: 755011

## 2024-12-14 NOTE — Progress Notes (Signed)
 Remote ICD Transmission

## 2024-12-19 NOTE — Progress Notes (Signed)
 31 day ICM Remote transmission canceled due to [facility] clinic is on hold until further notice.  91 day remote monitoring will continue per protocol.

## 2024-12-20 ENCOUNTER — Ambulatory Visit (HOSPITAL_BASED_OUTPATIENT_CLINIC_OR_DEPARTMENT_OTHER): Admission: RE | Admit: 2024-12-20 | Discharge: 2024-12-20 | Disposition: A | Source: Ambulatory Visit

## 2024-12-20 DIAGNOSIS — R490 Dysphonia: Secondary | ICD-10-CM

## 2024-12-20 DIAGNOSIS — J3801 Paralysis of vocal cords and larynx, unilateral: Secondary | ICD-10-CM

## 2024-12-20 DIAGNOSIS — J439 Emphysema, unspecified: Secondary | ICD-10-CM | POA: Diagnosis not present

## 2024-12-20 LAB — POCT I-STAT CREATININE: Creatinine, Ser: 1 mg/dL (ref 0.44–1.00)

## 2024-12-20 MED ORDER — IOHEXOL 300 MG/ML  SOLN
75.0000 mL | Freq: Once | INTRAMUSCULAR | Status: AC | PRN
  Administered 2024-12-20: 75 mL via INTRAVENOUS

## 2025-01-02 ENCOUNTER — Ambulatory Visit

## 2025-01-08 ENCOUNTER — Ambulatory Visit: Admitting: Speech Pathology

## 2025-01-08 ENCOUNTER — Ambulatory Visit

## 2025-01-15 ENCOUNTER — Ambulatory Visit: Admitting: Cardiology

## 2025-03-13 ENCOUNTER — Encounter

## 2025-06-12 ENCOUNTER — Encounter

## 2025-09-11 ENCOUNTER — Encounter
